# Patient Record
Sex: Female | Born: 1950
Health system: Southern US, Community
[De-identification: ages and names within clinical notes are randomized; demographics above are authoritative.]

## PROBLEM LIST (undated history)

## (undated) DIAGNOSIS — D509 Iron deficiency anemia, unspecified: Secondary | ICD-10-CM

## (undated) DIAGNOSIS — M1990 Unspecified osteoarthritis, unspecified site: Secondary | ICD-10-CM

## (undated) DIAGNOSIS — N200 Calculus of kidney: Secondary | ICD-10-CM

## (undated) DIAGNOSIS — M25561 Pain in right knee: Secondary | ICD-10-CM

## (undated) DIAGNOSIS — K635 Polyp of colon: Secondary | ICD-10-CM

## (undated) DIAGNOSIS — R112 Nausea with vomiting, unspecified: Secondary | ICD-10-CM

## (undated) DIAGNOSIS — M549 Dorsalgia, unspecified: Secondary | ICD-10-CM

## (undated) DIAGNOSIS — E162 Hypoglycemia, unspecified: Secondary | ICD-10-CM

## (undated) DIAGNOSIS — F329 Major depressive disorder, single episode, unspecified: Secondary | ICD-10-CM

## (undated) DIAGNOSIS — E669 Obesity, unspecified: Secondary | ICD-10-CM

## (undated) DIAGNOSIS — R600 Localized edema: Secondary | ICD-10-CM

## (undated) DIAGNOSIS — Z87442 Personal history of urinary calculi: Secondary | ICD-10-CM

## (undated) DIAGNOSIS — E7800 Pure hypercholesterolemia, unspecified: Secondary | ICD-10-CM

## (undated) DIAGNOSIS — I5189 Other ill-defined heart diseases: Secondary | ICD-10-CM

## (undated) DIAGNOSIS — F419 Anxiety disorder, unspecified: Secondary | ICD-10-CM

## (undated) DIAGNOSIS — I2510 Atherosclerotic heart disease of native coronary artery without angina pectoris: Secondary | ICD-10-CM

## (undated) DIAGNOSIS — Z8719 Personal history of other diseases of the digestive system: Secondary | ICD-10-CM

## (undated) DIAGNOSIS — K219 Gastro-esophageal reflux disease without esophagitis: Secondary | ICD-10-CM

## (undated) DIAGNOSIS — K5900 Constipation, unspecified: Secondary | ICD-10-CM

## (undated) DIAGNOSIS — F32A Depression, unspecified: Secondary | ICD-10-CM

## (undated) DIAGNOSIS — R55 Syncope and collapse: Secondary | ICD-10-CM

## (undated) DIAGNOSIS — Z9889 Other specified postprocedural states: Secondary | ICD-10-CM

## (undated) DIAGNOSIS — E042 Nontoxic multinodular goiter: Secondary | ICD-10-CM

## (undated) DIAGNOSIS — E119 Type 2 diabetes mellitus without complications: Secondary | ICD-10-CM

## (undated) DIAGNOSIS — R7303 Prediabetes: Secondary | ICD-10-CM

## (undated) DIAGNOSIS — I7090 Unspecified atherosclerosis: Secondary | ICD-10-CM

## (undated) DIAGNOSIS — C801 Malignant (primary) neoplasm, unspecified: Secondary | ICD-10-CM

## (undated) DIAGNOSIS — I10 Essential (primary) hypertension: Secondary | ICD-10-CM

## (undated) DIAGNOSIS — R5382 Chronic fatigue, unspecified: Secondary | ICD-10-CM

## (undated) DIAGNOSIS — G4730 Sleep apnea, unspecified: Secondary | ICD-10-CM

## (undated) DIAGNOSIS — W57XXXA Bitten or stung by nonvenomous insect and other nonvenomous arthropods, initial encounter: Secondary | ICD-10-CM

## (undated) HISTORY — PX: UPPER GASTROINTESTINAL ENDOSCOPY: SHX188

## (undated) HISTORY — DX: Type 2 diabetes mellitus without complications: E11.9

## (undated) HISTORY — DX: Syncope and collapse: R55

## (undated) HISTORY — DX: Nontoxic multinodular goiter: E04.2

## (undated) HISTORY — PX: COLONOSCOPY: SHX174

## (undated) HISTORY — DX: Unspecified osteoarthritis, unspecified site: M19.90

## (undated) HISTORY — DX: Chronic fatigue, unspecified: R53.82

## (undated) HISTORY — DX: Constipation, unspecified: K59.00

## (undated) HISTORY — DX: Unspecified atherosclerosis: I70.90

## (undated) HISTORY — DX: Polyp of colon: K63.5

## (undated) HISTORY — PX: CARDIAC CATHETERIZATION: SHX172

## (undated) HISTORY — DX: Prediabetes: R73.03

## (undated) HISTORY — DX: Pain in right knee: M25.561

## (undated) HISTORY — DX: Localized edema: R60.0

## (undated) HISTORY — DX: Hypoglycemia, unspecified: E16.2

## (undated) HISTORY — DX: Other ill-defined heart diseases: I51.89

## (undated) HISTORY — PX: TONSILLECTOMY: SUR1361

## (undated) HISTORY — DX: Obesity, unspecified: E66.9

## (undated) HISTORY — PX: ANTERIOR FUSION CERVICAL SPINE: SUR626

## (undated) HISTORY — DX: Iron deficiency anemia, unspecified: D50.9

## (undated) HISTORY — DX: Anxiety disorder, unspecified: F41.9

## (undated) HISTORY — PX: LITHOTRIPSY: SUR834

## (undated) HISTORY — DX: Atherosclerotic heart disease of native coronary artery without angina pectoris: I25.10

## (undated) HISTORY — DX: Bitten or stung by nonvenomous insect and other nonvenomous arthropods, initial encounter: W57.XXXA

## (undated) HISTORY — DX: Dorsalgia, unspecified: M54.9

---

## 1998-07-31 DIAGNOSIS — C801 Malignant (primary) neoplasm, unspecified: Secondary | ICD-10-CM

## 1998-07-31 HISTORY — DX: Malignant (primary) neoplasm, unspecified: C80.1

## 2000-07-31 HISTORY — PX: CARPAL TUNNEL RELEASE: SHX101

## 2001-06-26 ENCOUNTER — Ambulatory Visit (HOSPITAL_COMMUNITY): Admission: RE | Admit: 2001-06-26 | Discharge: 2001-06-26 | Payer: Self-pay | Admitting: Family Medicine

## 2001-06-26 ENCOUNTER — Encounter: Payer: Self-pay | Admitting: Family Medicine

## 2001-08-14 ENCOUNTER — Encounter: Payer: Self-pay | Admitting: Neurosurgery

## 2001-08-14 ENCOUNTER — Inpatient Hospital Stay (HOSPITAL_COMMUNITY): Admission: RE | Admit: 2001-08-14 | Discharge: 2001-08-15 | Payer: Self-pay | Admitting: Neurosurgery

## 2002-01-28 ENCOUNTER — Encounter: Payer: Self-pay | Admitting: Family Medicine

## 2002-01-28 ENCOUNTER — Encounter: Admission: RE | Admit: 2002-01-28 | Discharge: 2002-01-28 | Payer: Self-pay | Admitting: Family Medicine

## 2002-03-06 ENCOUNTER — Emergency Department (HOSPITAL_COMMUNITY): Admission: EM | Admit: 2002-03-06 | Discharge: 2002-03-06 | Payer: Self-pay | Admitting: Emergency Medicine

## 2002-04-22 ENCOUNTER — Emergency Department (HOSPITAL_COMMUNITY): Admission: EM | Admit: 2002-04-22 | Discharge: 2002-04-22 | Payer: Self-pay | Admitting: Emergency Medicine

## 2002-09-26 ENCOUNTER — Ambulatory Visit (HOSPITAL_COMMUNITY): Admission: RE | Admit: 2002-09-26 | Discharge: 2002-09-26 | Payer: Self-pay | Admitting: Gastroenterology

## 2003-08-13 ENCOUNTER — Other Ambulatory Visit: Admission: RE | Admit: 2003-08-13 | Discharge: 2003-08-13 | Payer: Self-pay | Admitting: Family Medicine

## 2003-11-06 ENCOUNTER — Encounter: Admission: RE | Admit: 2003-11-06 | Discharge: 2003-11-06 | Payer: Self-pay | Admitting: Family Medicine

## 2004-09-22 ENCOUNTER — Other Ambulatory Visit: Admission: RE | Admit: 2004-09-22 | Discharge: 2004-09-22 | Payer: Self-pay | Admitting: Family Medicine

## 2004-10-09 ENCOUNTER — Encounter: Admission: RE | Admit: 2004-10-09 | Discharge: 2004-10-09 | Payer: Self-pay | Admitting: Family Medicine

## 2004-10-18 ENCOUNTER — Encounter: Admission: RE | Admit: 2004-10-18 | Discharge: 2005-01-16 | Payer: Self-pay | Admitting: Psychology

## 2004-10-18 ENCOUNTER — Ambulatory Visit: Payer: Self-pay | Admitting: Psychology

## 2005-03-17 ENCOUNTER — Ambulatory Visit (HOSPITAL_COMMUNITY): Admission: RE | Admit: 2005-03-17 | Discharge: 2005-03-17 | Payer: Self-pay | Admitting: Cardiology

## 2005-07-06 ENCOUNTER — Encounter: Admission: RE | Admit: 2005-07-06 | Discharge: 2005-07-06 | Payer: Self-pay | Admitting: Family Medicine

## 2005-07-31 HISTORY — PX: BREAST BIOPSY: SHX20

## 2005-07-31 HISTORY — PX: BREAST CYST EXCISION: SHX579

## 2005-08-01 ENCOUNTER — Encounter: Admission: RE | Admit: 2005-08-01 | Discharge: 2005-08-01 | Payer: Self-pay | Admitting: Family Medicine

## 2005-08-01 ENCOUNTER — Encounter (INDEPENDENT_AMBULATORY_CARE_PROVIDER_SITE_OTHER): Payer: Self-pay | Admitting: *Deleted

## 2006-03-07 ENCOUNTER — Encounter: Admission: RE | Admit: 2006-03-07 | Discharge: 2006-03-07 | Payer: Self-pay | Admitting: Family Medicine

## 2006-03-14 ENCOUNTER — Encounter: Admission: RE | Admit: 2006-03-14 | Discharge: 2006-03-14 | Payer: Self-pay | Admitting: Family Medicine

## 2006-05-25 ENCOUNTER — Other Ambulatory Visit: Admission: RE | Admit: 2006-05-25 | Discharge: 2006-05-25 | Payer: Self-pay | Admitting: Family Medicine

## 2006-06-03 ENCOUNTER — Encounter: Admission: RE | Admit: 2006-06-03 | Discharge: 2006-06-03 | Payer: Self-pay | Admitting: Family Medicine

## 2006-11-06 ENCOUNTER — Encounter: Admission: RE | Admit: 2006-11-06 | Discharge: 2006-11-06 | Payer: Self-pay | Admitting: Cardiology

## 2006-11-07 ENCOUNTER — Ambulatory Visit: Payer: Self-pay | Admitting: Internal Medicine

## 2006-12-11 ENCOUNTER — Encounter: Admission: RE | Admit: 2006-12-11 | Discharge: 2006-12-11 | Payer: Self-pay | Admitting: Neurology

## 2006-12-11 ENCOUNTER — Encounter: Admission: RE | Admit: 2006-12-11 | Discharge: 2006-12-11 | Payer: Self-pay | Admitting: Family Medicine

## 2006-12-25 ENCOUNTER — Encounter: Admission: RE | Admit: 2006-12-25 | Discharge: 2006-12-25 | Payer: Self-pay | Admitting: Neurology

## 2007-12-19 ENCOUNTER — Other Ambulatory Visit: Admission: RE | Admit: 2007-12-19 | Discharge: 2007-12-19 | Payer: Self-pay | Admitting: Family Medicine

## 2008-08-11 ENCOUNTER — Encounter: Admission: RE | Admit: 2008-08-11 | Discharge: 2008-08-11 | Payer: Self-pay | Admitting: Family Medicine

## 2008-08-14 ENCOUNTER — Encounter: Admission: RE | Admit: 2008-08-14 | Discharge: 2008-08-14 | Payer: Self-pay | Admitting: Family Medicine

## 2009-03-09 ENCOUNTER — Encounter: Admission: RE | Admit: 2009-03-09 | Discharge: 2009-03-09 | Payer: Self-pay | Admitting: Family Medicine

## 2009-03-11 ENCOUNTER — Other Ambulatory Visit: Admission: RE | Admit: 2009-03-11 | Discharge: 2009-03-11 | Payer: Self-pay | Admitting: Family Medicine

## 2009-12-29 DIAGNOSIS — M25561 Pain in right knee: Secondary | ICD-10-CM

## 2009-12-29 HISTORY — DX: Pain in right knee: M25.561

## 2010-01-12 ENCOUNTER — Encounter: Admission: RE | Admit: 2010-01-12 | Discharge: 2010-01-12 | Payer: Self-pay | Admitting: Family Medicine

## 2010-11-29 HISTORY — PX: TOTAL KNEE ARTHROPLASTY: SHX125

## 2010-12-06 ENCOUNTER — Other Ambulatory Visit (HOSPITAL_COMMUNITY): Payer: Self-pay

## 2010-12-16 NOTE — Cardiovascular Report (Signed)
NAMEKUULEI, [name]            ACCOUNT NO.:  [id number]   MEDICAL RECORD NO.:  [id number]          PATIENT TYPE:  OIB   LOCATION:  2899                         FACILITY:  [facility]   PHYSICIAN:  Armanda Magic, M.D.     DATE OF BIRTH:  01-15-1951   DATE OF PROCEDURE:  03/17/2005  DATE OF DISCHARGE:                              CARDIAC CATHETERIZATION   REFERRING PHYSICIAN:  Dr. Chales Salmon. Thacker.   PROCEDURE:  Left heart catheterization, coronary angiography, left  ventriculography.   OPERATOR:  Armanda Magic, M.D.   INDICATIONS:  Shortness of breath and abnormal Cardiolite.   COMPLICATIONS:  None.   IV ACCESS:  Via right femoral artery 6-French sheath.   This is a very pleasant 60 year old obese white female with a history of  depression as well as dyslipidemia who has been having progressively  worsening shortness of breath which is limiting her daily activities. She  says she is having problems even playing her flute. She is a Warden/ranger  at [employer]. She says he also gets extremely fatigued when doing any kind of  minimal activity around the house and also gets nonexertional chest  pressure. She now presents for cardiac catheterization. Of note, a stress  Cardiolite study showed a small area of very mild decreased uptake in the  anterior wall. It was unclear as to whether this represented ischemia or  breast attenuation artifact. She now presents for cardiac catheterization.   The patient was brought to cardiac catheterization laboratory in a fasting  nonsedated state. Informed consent was obtained. The patient was connected  to continuous heart rate and pulse oximetry monitoring and intermittent  blood pressure monitoring. The right groin was prepped and draped in a  sterile fashion. One percent Xylocaine was used for local anesthesia. Using  the modified Seldinger technique, a 6-French sheath was placed in right  femoral artery. Under fluoroscopic guidance, a 6-French JL-4  catheter was  placed in the left coronary artery. Multiple cine films were taken at 30-  degree RAO/40-degree LAO views. This catheter was then exchanged out over a  guidewire for 6-French JR-4 catheter which was placed under fluoroscopic  guidance in the right coronary artery. Multiple cine films were taken at 30-  degree RAO/40-degree LAO views. This catheter was exchanged out over a  guidewire for a 6-French angled pigtail catheter was placed under  fluoroscopic guidance in the left ventricular cavity. Left ventriculography  was performed in 30-degree RAO view using a total of 30 cc of contrast at 15  cc per second. The catheter was then pulled back across the aortic valve  with no significant gradient noted. At the end of procedure, all catheters  and sheaths were removed. Manual compression was performed until adequate  hemostasis was obtained. The patient transferred back to her room in stable  condition.   RESULTS:  Left main coronary is widely patent and bifurcates in the left  anterior descending artery and left circumflex artery. Left anterior  descending artery is widely patent throughout its course to the apex, giving  rise to one diagonal branch which is widely patent and bifurcates  into  daughter branches both of which are widely patent.   The left circumflex is widely patent and gives rise to a first high obtuse  marginal branch which has a 20% ostial narrowing. The rest of the vessel is  widely patent. The ongoing circumflex gives rise to a second obtuse marginal  branch which is widely patent, and the left circumflex then traverses the AV  groove and is widely patent.   Right coronary is widely patent throughout its course, bifurcating distally  into a posterior descending artery and posterior lateral artery.   Left ventriculography artery shows normal LV systolic function, EF 60%, left  ventricular pressure 126/11 mmHg, aortic pressure 120/67 mmHg, LVEDP 21   mmHg.   ASSESSMENT:  1.  Nonobstructive coronary disease.  2.  Normal left ventricular function.  3.  Increased LVEDP consistent with diastolic dysfunction.  4.  Shortness of breath secondary to #3.  5.  Obese.  6.  Dyslipidemia.   PLAN:  IV fluid and bed rest for 6 hours and discharge to home. Start  Cardizem LA 180 mg a day for diastolic dysfunction. Start Lasix 20 mg a day,  K-Dur mEq a day. BMET check in one week. Follow-up with Dr. Mayford Knife in two  weeks.      Armanda Magic, M.D.  Electronically Signed     TT/MEDQ  D:  03/17/2005  T:  03/17/2005  Job:  [id number]   cc:   Chales Salmon. Abigail Miyamoto, M.D.  [address]  [place]  [place] [address]  Fax: [phone]

## 2010-12-16 NOTE — Op Note (Signed)
[place]  Patient:    [name], [name] Visit Number: [id number] MRN: [id number]          Service Type: SUR Location: NPAC 3172 03 Attending Physician:  Barton Fanny Dictated by:   Hewitt Shorts, M.D. Proc. Date: 08/14/01 Admit Date:  08/14/2001                             Operative Report  PREOPERATIVE DIAGNOSES:  C4-5, C5-6, and C6-7 herniated cervical disk, degenerative disk disease, spondylosis, and radiculopathy.  POSTOPERATIVE DIAGNOSES:  C4-5, C5-6, and C6-7 herniated cervical disk, degenerative disk disease, spondylosis, and radiculopathy.  PROCEDURE:  C4-5, C5-6, and C6-7 anterior cervical diskectomy and arthrodesis with iliac crest allograft and Tether cervical plating.  SURGEON:  Hewitt Shorts, M.D.  ASSISTANT:  Payton Doughty, M.D.  ANESTHESIA:  General endotracheal.  INDICATION FOR PROCEDURE:  A 60 year old woman who presented with bilateral cervical radiculopathy, right worse than left, who was found by MRI and x-ray to have herniated cervical disk superimposed upon degenerative disk disease and spondylosis at multiple levels with resulting radiculopathy.  The decision was made to proceed with a multilevel anterior cervical diskectomy and arthrodesis.  DESCRIPTION OF PROCEDURE:  The patient was brought to the operating room and placed under general endotracheal anesthesia.  The patient was placed in 10 pounds of Holter traction.  The neck was prepped with Betadine soap and solution and draped in a sterile fashion.  An oblique incision was made just anterior to the anterior border of the left sternocleidomastoid . The line of the incision was infiltrated with local anesthetic with epinephrine.  A skin incision was made, and dissection was carried down through the subcutaneous tissue and platysma, bipolar cautery and electrocautery used to maintain hemostasis as necessary.  Dissection was carried out  through an avascular plane leaving the sternocleidomastoid, carotid artery, and jugular vein laterally and the trachea and esophagus medially.  The ventral aspect of the vertebral column was identified and a localizing x-ray taken, and the C4-5, C5-6, and C6-7 intervertebral disk spaces were identified.  The ventral aspect of the disk spaces were incised and the disk space entered and diskectomy performed with a variety of microcurettes and pituitary rongeurs.  The cartilaginous end plates of the corresponding vertebrae were removed using microcurette and the Micro-Max drill.  The ventral osteophytic overgrowth was removed using the Micro-Max drill and double-action rongeurs.  The microscope was draped and brought into the field to provide additional magnification, illumination, and visualization.  The remainder of the procedure was performed using microdissection and microsurgical technique.  Posterior osteophytic overgrowth was removed using the Micro-Max drill and a 2 mm Kerrison punch with a thin foot plate.  The posterior longitudinal ligament was thickened at each of the levels.  This was removed, as was the disk material and disk herniation.  We were able to decompress the thecal sac and nerve roots within the foramina bilaterally at each level.  Once the decompression was complete,d hemostasis was established with the use of Gelfoam soaked in thrombin.  Then we selected three wedges of iliac crest allograft.  They were cut and shaped to size and positioned in the intervertebral disk space and countersunk.  We then selected a 58 mm Tether cervical plate.  It was positioned over the fusion construct and secured to the vertebrae with a variety of screws.  At C4, 4.0 x 13 mm variable-angled screws  were placed; at C5, a single 4.0 x 14 mm fixed-angle screw was placed; at C6, a 4.0 x 13 mm variable-angled screw was placed; and at C7, a pair of 4.0 x 14 mm variable-angle screws  were placed.  Each of the screw holes was drilled and tapped and the screw placed. After all six screws were placed, final tightening was done.  The wound was irrigated with bacitracin solution and checked for hemostasis.  An x-ray was taken, which showed the grafts in good position at C4-5 and C5-6.  We could not visualize the C6-7 level.  Screws were in good position at C4 and C5.  We could not visualize the C6-7 vertebrae.  The wound was checked once again for hemostasis, and then we proceeded with closure.  The platysma was closed with interrupted, inverted 2-0 undyed Vicryl sutures, the subcutaneous and subcuticular layer were closed with interrupted, inverted 3-0 undyed Vicryl sutures, and the skin edges were approximated with Dermabond.  The patient tolerated the procedure well.  The estimated blood loss was 300 cc.  Sponge and needle count were correct.  Following surgery, the patient, who had had her cervical traction removed at the time of the arthrodesis, was reversed from the anesthetic, extubated, placed in a soft cervical collar, and transferred to the recovery room for further care. Dictated by:   Hewitt Shorts, M.D. Attending Physician:  Barton Fanny DD:  08/14/01 TD:  08/14/01 Job: [id number]

## 2010-12-16 NOTE — Letter (Signed)
November 07, 2006    Armanda Magic, M.D.  [address], [address]  [place], [place] [address]   RE:  [name], [name]  MRN:  [id number]  /  DOB:  Sep 08, 1950   Dear Gloris Manchester,   Thank you for referring [name] for EP evaluation.  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  After  awakening, she apparently injured her leg and got up into a chair and  then passed out yet again.  The patient denies ever having any prior  histories of passing out or syncope, but did note dietary indiscretion  resulting in severe nausea.  She apparently had multiple Tums tablets  after her nausea and this may well have precipitated her diarrhea.  The  patient is referred today for evaluation.  There is no strong family  history of sudden cardiac death and the patient has no evidence of  significant obstructive coronary disease by catheterization by her  report.   Her exam is fairly unremarkable, except that she does have a boot on her  left leg from the injury she sustained with her episode of syncope.   Her medicines have been well-documented.   Her EKG demonstrates sinus rhythm with no prolongation of the Q-T  interval.   I strongly suspect that with her prior absence of any cardiac history  including the absence of history of syncope, that the present episode  simply represents a vagal reaction occurring after having severe nausea  and the urge to defecate.  I have recommended that she try to avoid  circumstances that might reproduce these symptoms.  The patient does  note that a CT scan was at some point done revealing small, what she  described as strokes, although I do not have details of this.  These are  in fact thought to be possible lacunar infarcts and I wonder if  consideration for a hypercoagulable workup would be  in order; I would  defer this to your discretion.  If the patient has recurrent episodes of  syncope, then consideration for head-up tilt table testing would be  warranted, as would be the consideration for insertion of an implantable  loop recorder.  I have not recommended followup with Ms. Ragin, but  will be happy to see her back based on your discretion.   Thanks again for referring this patient for EP evaluation.    Sincerely,      Doylene Canning. Ladona Ridgel, MD  Electronically Signed    GWT/MedQ  DD: 11/07/2006  DT: 11/08/2006  Job #: [id number]

## 2010-12-16 NOTE — Op Note (Signed)
   NAMESAKSHI, SERMONS                      ACCOUNT NO.:  [id number]   MEDICAL RECORD NO.:  [id number]                   PATIENT TYPE:  AMB   LOCATION:  ENDO                                 FACILITY:  [facility]   PHYSICIAN:  Bernette Redbird, M.D.                DATE OF BIRTH:  01-Jun-1951   DATE OF PROCEDURE:  09/26/2002  DATE OF DISCHARGE:                                 OPERATIVE REPORT   PROCEDURE:  Colonoscopy.   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   FINDINGS:  Mild left-sided diverticulosis.   DESCRIPTION OF PROCEDURE:  The nature, purpose, and risks of the procedure  had been discussed with the patient who provided written consent.  Sedation  was fentanyl 75 mcg and Versed 10 mg IV without arrhythmias or desaturation.   The Olympus adult video colonoscope was advanced around the colon without  difficulty to the cecum. The tip was nubbed into the orifice of the terminal  ileum and the small bowel mucosa looked normal. The terminal ileum was not  freely cannulated. Pullback was then performed. The quality of the prep was  excellent and it was felt that all areas were well seen.   The patient had some mild left-sided diverticulosis, but this was otherwise  a normal examination, without evidence of polyps, cancer, colitis, or  vascular malformations.  Retroflexion in the rectum as well as reinspection  of the rectosigmoid was unremarkable.   No biopsies were obtained. The patient tolerated the procedure well and  there were no apparent complications.   IMPRESSION:  Normal screening colonoscopy except for mild left-sided  diverticulosis.   PLAN:  Consider a sigmoidoscopic evaluation in five years for ongoing  screening.                                                Bernette Redbird, M.D.    RB/MEDQ  D:  09/26/2002  T:  09/26/2002  Job:  [id number]   cc:   Chales Salmon. Abigail Miyamoto, M.D.  [address]  [place]  [place] [address]  Fax: [phone]

## 2010-12-16 NOTE — Assessment & Plan Note (Signed)
[facility]                         ELECTROPHYSIOLOGY OFFICE NOTE   NAME:[name], [name]                   MRN:          [id number]  DATE:11/07/2006                            DOB:          1951-01-17    REASON FOR VISIT:  The patient is referred today by Dr. Armanda Magic for  evaluation of syncope.   HISTORY OF PRESENT ILLNESS:  The patient is a very pleasant, 60 year old  lady who has a history of atypical chest pain and catheterization  demonstrating preserved LV function and no significant obstructive  coronary artery disease.  She had two episodes of syncope which occurred  very closely together.  She has never had any other prior episodes of  syncope.  In addition, she has never had any near syncopal episodes in  the past.  The patient states that her current problem began several  weeks ago when she had eaten more than she should have for dinner  (pizza) and then later that evening, eaten a large amount of ice cream.  She subsequently developed some abdominal pain and took approximately 10-  12 Tums tablets and had difficulty sleeping that night.  She stated she  felt bad and had mild nausea.  She works as a professor at [employer] and  went to her office and while she was at her desk developed severe nausea  while sitting and subsequently became lightheaded.  She fell to the  floor after vomiting in the trash can.  Apparently, her leg got tied up  in a chair and she injured it.  She quickly got up and immediately  became nauseated again and again fainted.  After awakening the second  time, she felt nauseated and bad and had actually noted that she had  lost control of both bowel and bladder function.  For all of the above  reasons, she subsequently went home and is now referred for additional  medical attention.  The patient had no significant palpitations.  She  denies chest pain and shortness of breath.   PAST MEDICAL HISTORY:  History of  cervical neck fusion 6 years ago.  She  has a history of a breast cyst.   SOCIAL HISTORY:  She denies tobacco or ethanol abuse.  She does drink  occasional alcoholic beverage socially, but never more than one or two  per day.   FAMILY HISTORY:  Both parents are deceased.  Her mother had  complications of renal failure and her father after a stroke.   REVIEW OF SYSTEMS:  Notable for occasional problems with constipation,  generalized fatigue.  She has reflux symptoms.  She has a history of  hiatal hernia.  She has a history of nephrolithiasis and kidney stones,  but no recent pain from kidney stones.  She has a history of arthritis  particularly in her knees with right great than left.  She has a history  of anxiety and depression.  The patient does state that she has had  headaches and by her report has had a neurologic evaluation which  demonstrated multiple small strokes, although I do not have details  about this.  She states that her mother had a history of blood clots as  well.  The rest of her review of systems was negative.   PHYSICAL EXAMINATION:  GENERAL:  She is a pleasant, well-appearing,  obese, middle-age woman in no acute distress.  VITAL SIGNS:  Blood pressure 137/85, pulse 85 and regular, respirations  18.  Weight 250 pounds.  HEENT:  Normocephalic, atraumatic.  Pupils equal round and reactive to  light.  Oropharynx moist.  Sclerae anicteric.  NECK:  No jugular venous distention.  There is no thyromegaly.  Trachea  midline.  Carotids 2+ and symmetric.  LUNGS:  Clear to auscultation bilaterally, no wheezes, rales or rhonchi.  No increased work of breathing.  CARDIAC:  Regular rate and rhythm with normal S1, S2.  There are no  murmurs, rubs or gallops present.  PMI was not laterally displaced nor  enlarged.  ABDOMEN:  I did not appreciate any rebound or guarding.  Soft,  nontender, nondistended.  EXTREMITIES:  No clubbing, cyanosis or edema.  Pulses were 2+ and   symmetric.  NEUROLOGIC:  Alert and oriented x3.  Cranial nerves 2-12 grossly intact.  Strength was 5/5 and symmetric.   LABORATORY DATA AND X-RAY FINDINGS:  EKG demonstrates sinus rhythm with  normal axis and intervals.   IMPRESSION:  Episode of unexplained syncope associated with loss of  bowel and bladder continence all preceded by severe nausea and abdominal  distress.   DISCUSSION:  The patient has a history of preserved left ventricular  function and no significant coronary disease.  I think the likely  diagnosis is a mediated mechanism secondary to her abdominal distress.  For now, I have recommended no additional followup or care for this  patient other than trying to avoid things that cause recurrent abdominal  pain, i.e. overeating and eating too much in the way of fatty foods.  If  she had recurrent symptoms, then consideration of tilt table test would  be in order.  However, I have most recommend that she try to avoid any  noxious stimuli and that if she does experience these, that she try to  lie down or sit down as soon as possible.  Consideration for  hypercoagulable workup would be in order if she, in fact, does truly  have multiple small strokes on MRI/CT scan.     Doylene Canning. Ladona Ridgel, MD  Electronically Signed    GWT/MedQ  DD: 11/07/2006  DT: 11/08/2006  Job #: [id number]   cc:   Armanda Magic, M.D.  Chales Salmon. Abigail Miyamoto, M.D.

## 2010-12-23 ENCOUNTER — Other Ambulatory Visit (HOSPITAL_COMMUNITY): Payer: BC Managed Care – PPO

## 2010-12-23 ENCOUNTER — Ambulatory Visit (HOSPITAL_COMMUNITY)
Admission: RE | Admit: 2010-12-23 | Discharge: 2010-12-23 | Disposition: A | Discharge status: Home / Self Care | Payer: BC Managed Care – PPO | Source: Ambulatory Visit | Attending: Orthopedic Surgery | Admitting: Orthopedic Surgery

## 2010-12-23 ENCOUNTER — Encounter (HOSPITAL_COMMUNITY)
Admission: RE | Admit: 2010-12-23 | Discharge: 2010-12-23 | Disposition: A | Discharge status: Home / Self Care | Payer: BC Managed Care – PPO | Source: Ambulatory Visit | Attending: Orthopedic Surgery | Admitting: Orthopedic Surgery

## 2010-12-23 ENCOUNTER — Other Ambulatory Visit (HOSPITAL_COMMUNITY): Payer: Self-pay | Admitting: Orthopedic Surgery

## 2010-12-23 DIAGNOSIS — Z01818 Encounter for other preprocedural examination: Secondary | ICD-10-CM | POA: Insufficient documentation

## 2010-12-23 DIAGNOSIS — G4730 Sleep apnea, unspecified: Secondary | ICD-10-CM | POA: Insufficient documentation

## 2010-12-23 DIAGNOSIS — M1711 Unilateral primary osteoarthritis, right knee: Secondary | ICD-10-CM

## 2010-12-23 DIAGNOSIS — M1710 Unilateral primary osteoarthritis, unspecified knee: Secondary | ICD-10-CM | POA: Insufficient documentation

## 2010-12-23 DIAGNOSIS — I10 Essential (primary) hypertension: Secondary | ICD-10-CM | POA: Insufficient documentation

## 2010-12-23 DIAGNOSIS — IMO0002 Reserved for concepts with insufficient information to code with codable children: Secondary | ICD-10-CM | POA: Insufficient documentation

## 2010-12-23 DIAGNOSIS — Z01812 Encounter for preprocedural laboratory examination: Secondary | ICD-10-CM | POA: Insufficient documentation

## 2010-12-23 LAB — COMPREHENSIVE METABOLIC PANEL
BUN: 14 mg/dL (ref 6–23)
CO2: 28 mEq/L (ref 19–32)
Calcium: 10 mg/dL (ref 8.4–10.5)
Chloride: 104 mEq/L (ref 96–112)
Creatinine, Ser: 0.75 mg/dL (ref 0.4–1.2)
GFR calc non Af Amer: >60 mL/min (ref >60)
Glucose, Bld: 113 mg/dL — ABNORMAL HIGH (ref 70–99)
Total Bilirubin: 0.4 mg/dL (ref 0.3–1.2)

## 2010-12-23 LAB — TYPE AND SCREEN
ABO/RH(D): O POS
Antibody Screen: NEGATIVE

## 2010-12-23 LAB — URINE MICROSCOPIC-ADD ON

## 2010-12-23 LAB — URINALYSIS, ROUTINE W REFLEX MICROSCOPIC
Glucose, UA: NEGATIVE mg/dL
Ketones, ur: NEGATIVE mg/dL
Nitrite: NEGATIVE
Specific Gravity, Urine: 1.018 (ref 1.005–1.030)
pH: 6.5 (ref 5.0–8.0)

## 2010-12-23 LAB — PROTIME-INR: Prothrombin Time: 13.5 seconds (ref 11.6–15.2)

## 2010-12-23 LAB — CBC
Hemoglobin: 12.5 g/dL (ref 12.0–15.0)
MCH: 26.3 pg (ref 26.0–34.0)
RBC: 4.76 MIL/uL (ref 3.87–5.11)
WBC: 6.7 10*3/uL (ref 4.0–10.5)

## 2010-12-23 LAB — SURGICAL PCR SCREEN: Staphylococcus aureus: NEGATIVE

## 2010-12-28 ENCOUNTER — Inpatient Hospital Stay (HOSPITAL_COMMUNITY)
Admission: RE | Admit: 2010-12-28 | Discharge: 2010-12-30 | DRG: 209 | Disposition: A | Discharge status: Skilled Nursing Facility | Payer: BC Managed Care – PPO | Source: Ambulatory Visit | Attending: Orthopedic Surgery | Admitting: Orthopedic Surgery

## 2010-12-28 ENCOUNTER — Inpatient Hospital Stay (HOSPITAL_COMMUNITY): Payer: BC Managed Care – PPO

## 2010-12-28 DIAGNOSIS — F3289 Other specified depressive episodes: Secondary | ICD-10-CM | POA: Diagnosis present

## 2010-12-28 DIAGNOSIS — D649 Anemia, unspecified: Secondary | ICD-10-CM | POA: Diagnosis present

## 2010-12-28 DIAGNOSIS — Y9210 Unspecified residential institution as the place of occurrence of the external cause: Secondary | ICD-10-CM | POA: Diagnosis not present

## 2010-12-28 DIAGNOSIS — L298 Other pruritus: Secondary | ICD-10-CM | POA: Diagnosis not present

## 2010-12-28 DIAGNOSIS — R110 Nausea: Secondary | ICD-10-CM | POA: Diagnosis not present

## 2010-12-28 DIAGNOSIS — R7309 Other abnormal glucose: Secondary | ICD-10-CM | POA: Diagnosis not present

## 2010-12-28 DIAGNOSIS — I10 Essential (primary) hypertension: Secondary | ICD-10-CM | POA: Diagnosis present

## 2010-12-28 DIAGNOSIS — L2989 Other pruritus: Secondary | ICD-10-CM | POA: Diagnosis not present

## 2010-12-28 DIAGNOSIS — M1710 Unilateral primary osteoarthritis, unspecified knee: Principal | ICD-10-CM | POA: Diagnosis present

## 2010-12-28 DIAGNOSIS — E669 Obesity, unspecified: Secondary | ICD-10-CM | POA: Diagnosis present

## 2010-12-28 DIAGNOSIS — K219 Gastro-esophageal reflux disease without esophagitis: Secondary | ICD-10-CM | POA: Diagnosis present

## 2010-12-28 DIAGNOSIS — Z7901 Long term (current) use of anticoagulants: Secondary | ICD-10-CM

## 2010-12-28 DIAGNOSIS — T40605A Adverse effect of unspecified narcotics, initial encounter: Secondary | ICD-10-CM | POA: Diagnosis not present

## 2010-12-28 DIAGNOSIS — F329 Major depressive disorder, single episode, unspecified: Secondary | ICD-10-CM | POA: Diagnosis present

## 2010-12-28 DIAGNOSIS — G4733 Obstructive sleep apnea (adult) (pediatric): Secondary | ICD-10-CM | POA: Diagnosis present

## 2010-12-28 DIAGNOSIS — K449 Diaphragmatic hernia without obstruction or gangrene: Secondary | ICD-10-CM | POA: Diagnosis present

## 2010-12-28 DIAGNOSIS — Z981 Arthrodesis status: Secondary | ICD-10-CM

## 2010-12-28 DIAGNOSIS — Z79899 Other long term (current) drug therapy: Secondary | ICD-10-CM

## 2010-12-28 DIAGNOSIS — Z880 Allergy status to penicillin: Secondary | ICD-10-CM

## 2010-12-28 DIAGNOSIS — I2510 Atherosclerotic heart disease of native coronary artery without angina pectoris: Secondary | ICD-10-CM | POA: Diagnosis present

## 2010-12-29 LAB — BASIC METABOLIC PANEL
CO2: 31 mEq/L (ref 19–32)
Calcium: 8 mg/dL — ABNORMAL LOW (ref 8.4–10.5)
Chloride: 100 mEq/L (ref 96–112)
Creatinine, Ser: 0.56 mg/dL (ref 0.4–1.2)
GFR calc Af Amer: >60 mL/min (ref >60)
Glucose, Bld: 137 mg/dL — ABNORMAL HIGH (ref 70–99)
Sodium: 137 mEq/L (ref 135–145)

## 2010-12-29 LAB — CBC
HCT: 34.1 % — ABNORMAL LOW (ref 36.0–46.0)
MCH: 25.7 pg — ABNORMAL LOW (ref 26.0–34.0)
MCV: 81.8 fL (ref 78.0–100.0)
Platelets: 205 10*3/uL (ref 150–400)
RDW: 15.2 % (ref 11.5–15.5)
WBC: 7.1 10*3/uL (ref 4.0–10.5)

## 2010-12-30 ENCOUNTER — Inpatient Hospital Stay (HOSPITAL_COMMUNITY): Payer: BC Managed Care – PPO

## 2010-12-30 LAB — BASIC METABOLIC PANEL
BUN: 6 mg/dL (ref 6–23)
CO2: 29 mEq/L (ref 19–32)
Calcium: 8.8 mg/dL (ref 8.4–10.5)
Chloride: 98 mEq/L (ref 96–112)
Creatinine, Ser: 0.52 mg/dL (ref 0.4–1.2)
Glucose, Bld: 138 mg/dL — ABNORMAL HIGH (ref 70–99)

## 2010-12-30 LAB — CBC
Hemoglobin: 10.4 g/dL — ABNORMAL LOW (ref 12.0–15.0)
MCH: 26.3 pg (ref 26.0–34.0)
MCHC: 32.4 g/dL (ref 30.0–36.0)
MCV: 81.1 fL (ref 78.0–100.0)
RBC: 3.96 MIL/uL (ref 3.87–5.11)

## 2010-12-30 LAB — URINE MICROSCOPIC-ADD ON

## 2010-12-30 LAB — URINALYSIS, ROUTINE W REFLEX MICROSCOPIC
Bilirubin Urine: NEGATIVE
Glucose, UA: NEGATIVE mg/dL
Hgb urine dipstick: NEGATIVE
Ketones, ur: NEGATIVE mg/dL
Nitrite: NEGATIVE
Specific Gravity, Urine: 1.011 (ref 1.005–1.030)
pH: 7 (ref 5.0–8.0)

## 2010-12-31 LAB — URINE CULTURE: Culture  Setup Time: 201206011646

## 2011-01-19 NOTE — Discharge Summary (Signed)
[name], [name]            ACCOUNT NO.:  [id number]  MEDICAL RECORD NO.:  [id number]           PATIENT TYPE:  I  LOCATION:  5019                         FACILITY:  [facility]  PHYSICIAN:  Loreta Ave, M.D. DATE OF BIRTH:  1951-03-02  DATE OF ADMISSION:  12/28/2010 DATE OF DISCHARGE:  12/30/2010                              DISCHARGE SUMMARY   FINAL DIAGNOSES: 1. Status post right total knee replacement for end-stage degenerative     joint disease. 2. Coronary artery disease. 3. Vasovagal syncope. 4. Depression. 5. Dyslipidemia. 6. Hypertension. 7. Gastroesophageal reflux disease with hiatal hernia. 8. Anemia. 9. Hyperglycemia.  HOSPITAL COURSE:  On Dec 28, 2010, the patient was taken to the [facility] OR and a right total knee replacement procedure was performed. Surgeon, Mckinley Jewel, MD and assistant, Zonia Kief, PA-C. Anesthesia, general.  No specimens or cultures.  One Hemovac drain used. Tourniquet time 1 hour.  There were no surgical or anesthesia complications and the patient was transferred to recovery in stable condition.  Though on May 2012, pharmacy protocol, Coumadin, Lovenox started for DVT prophylaxis.  PT/OT consults.  The patient complained of right knee pain.  Discontinued the morphine PCA due to itching and nausea.  Started Percocet.  Temperature 97.3, pulse 90, respirations 18, blood pressure 134/70.  Right knee dressing clean, dry, and intact. Calf, nontender and neurovascularly intact.  Skin is warm and dry. Awaiting transfer to [facility] for rehab.  December 30, 2010, the patient doing much better.  Pain controlled.  She is ready for transfer. Temperature 97.6, WBC 7.7, hemoglobin 10.4, hematocrit 32.1, platelets 181.  Sodium 135, potassium 3.3, chloride 98, CO2 of 29, BUN 6, creatinine 0.52, glucose 138, INR 1.49.  Knee wound looks good and staples intact.  No drainage or signs of infection.  Hemovac drain removed.  Dressing reapplied.  Calf  nontender and neurovascularly intact.  Skin is warm and dry.  DISCHARGE MEDICATIONS: 1. Percocet 10/325, 1 to 2 tablets p.o. q 4-6 hours for pain. 2. Robaxin 500 mg 1 tablet p.o. q.6 h for spasms. 3. Lovenox 30 mg one subcu injection b.i.d. and discontinue when     Coumadin is therapeutic with INR 2 to 3. 4. Coumadin pharmacy protocol.  Maintain INR 2 to 3 x4 weeks postop     for DVT prophylaxis. 5. Zetia 10 mg 1 tablet p.o. daily. 6. Alprazolam 0.5 mg 2 tablets p.o. at bedtime p.r.n. as needed. 7. Nexium 40 mg 1 tablet p.o. daily. 8. Lisinopril 5 mg 1 tablet p.o. daily. 9. Seroquel XR 50 mg 1 tablet p.o. at bedtime. 10.Pristiq XR 50 mg 1 tablet p.o. q.a.m.  CONDITION:  Good and stable.  DISPOSITION:  Transfer to [facility].  INSTRUCTIONS:  While at [facility], the patient will continue to work with PT and OT to improve ambulation and knee range of motion and strengthening.  Weight bear as tolerated.  Can wean from walker to a single prong cane as comfortable.  Daily dressing changes with 4x4 gauze and can pull TED hose over this.  Do not apply any creams or ointments to her incision.  Okay to  shower, but no tub soaking.  Coumadin x4 weeks postop for DVT prophylaxis.  Discontinue Lovenox when Coumadin is therapeutic with INR 2 to 3.  Needs follow up visit with Dr. Eulah Pont 2 weeks postop for recheck and staple removal.  Return sooner if needed. Call our office if there are any questions or concerns.     Genene Churn. Denton Meek.   ______________________________ Loreta Ave, M.D.    JMO/MEDQ  D:  12/30/2010  T:  12/30/2010  Job:  [id number]  Electronically Signed by Zonia Kief P.A. on 01/11/2011 02:01:30 PM Electronically Signed by Mckinley Jewel M.D. on 01/19/2011 04:06:40 PM

## 2011-01-19 NOTE — Op Note (Signed)
[name], [name]            ACCOUNT NO.:  [id number]  MEDICAL RECORD NO.:  [id number]           PATIENT TYPE:  I  LOCATION:  5019                         FACILITY:  [facility]  PHYSICIAN:  Loreta Ave, M.D. DATE OF BIRTH:  12/21/1950  DATE OF PROCEDURE:  12/28/2010 DATE OF DISCHARGE:                              OPERATIVE REPORT   PREOPERATIVE DIAGNOSIS:  Right knee end-stage degenerative arthritis, varus alignment.  POSTOPERATIVE DIAGNOSIS:  Right knee end-stage degenerative arthritis, varus alignment.  PROCEDURES:  Modified minimally invasive right total knee replacement with Stryker triathlon prosthesis.  Cemented pegged posterior stabilized #3 femoral component.  Cemented #4 tibial component 9-mm insert. Cemented resurfacing 35-mm medial offset pegged patellar component. Soft tissue balancing medial capsule release.  SURGEON:  Loreta Ave, MD  ASSISTANT:  Genene Churn. Barry Dienes, [place], present throughout the entire case necessary for timely completion of procedure.  ANESTHESIA:  General.  BLOOD LOSS:  Minimal.  SPECIMENS:  None.  CULTURES:  None.  COMPLICATIONS:  None.  DRESSINGS:  Soft compressive knee immobilizer.  DRAINS:  Hemovac x1.  TOURNIQUET TIME:  1 hour.  PROCEDURE:  The patient was brought to the operating room, placed on the operating table in supine position.  After adequate anesthesia had been obtained, tourniquet was applied to upper aspect of the right knee. Prepped and draped in usual sterile fashion.  Exsanguinated the elevation Esmarch, tourniquet inflated to 350 mmHg.  Knee examined. Varus alignment partially correctable.  A fairly good extension and flexion to 120 degrees.  A straight incision above the patella down to tibial tubercle.  Medial arthrotomy, vastus splitting preserving quad tendon.  Knee exposed.  Grade 4 change throughout.  Medial capsule released.  Remnants of menisci, cruciate ligaments, and periarticular spurs  removed.  Intramedullary guide on the femur.  Distal cut 10 mm 75 degrees of valgus.  Using epicondylar axis, the femur was sized, cut, and fitted for a posterior stabilized #3 femoral component. Extramedullary guide on the tibia.  A 3-degree posterior slope cut. Adequate resection for a 9-mm insert.  All recess examined, all debris cleared throughout.  All spurs removed.  A trial was put in place.  A #3 above, #4 below, and a 9-mm insert.  Full extension, full flexion, good alignment, good stability, good balancing in flexion and extension, good mechanical axis.  Tibia was marked for rotation and then hand reamed. Patella was exposed.  Posterior 10 mm removed.  Drilled sized and fitted for a 35-mm component.  With trials, this had excellent tracking.  All trials were removed.  Copious irrigation with pulse irrigating device. Cement prepared and placed on all components firmly seated. Polyethylene attached to tibia and knee reduced.  Patella held with a clamp.  Once the cement had hardened, the knee was reexamined.  Again, pleased with alignment, stability, tracking, and mechanical axis. Hemovac was placed through a separate stab wound.  Arthrotomy closed with #1 Vicryl, skin and subcutaneous tissue with Vicryl and staples. Sterile compressive dressing applied.  Tourniquet was inflated and removed.  Knee immobilizer applied.  Anesthesia reversed.  Brought to the recovery room.  Tolerated the surgery well and  no complications.     Loreta Ave, M.D.     DFM/MEDQ  D:  12/28/2010  T:  12/29/2010  Job:  [id number]  Electronically Signed by Mckinley Jewel M.D. on 01/19/2011 04:06:38 PM

## 2011-02-17 ENCOUNTER — Other Ambulatory Visit: Payer: Self-pay | Admitting: Family Medicine

## 2011-02-17 DIAGNOSIS — Z1231 Encounter for screening mammogram for malignant neoplasm of breast: Secondary | ICD-10-CM

## 2011-02-27 ENCOUNTER — Ambulatory Visit
Admission: RE | Admit: 2011-02-27 | Discharge: 2011-02-27 | Disposition: A | Discharge status: Home / Self Care | Payer: BC Managed Care – PPO | Source: Ambulatory Visit | Attending: Family Medicine | Admitting: Family Medicine

## 2011-02-27 DIAGNOSIS — Z1231 Encounter for screening mammogram for malignant neoplasm of breast: Secondary | ICD-10-CM

## 2011-03-13 ENCOUNTER — Encounter (HOSPITAL_COMMUNITY)
Admission: RE | Admit: 2011-03-13 | Discharge: 2011-03-13 | Disposition: A | Discharge status: Home / Self Care | Payer: BC Managed Care – PPO | Source: Ambulatory Visit | Attending: Neurosurgery | Admitting: Neurosurgery

## 2011-03-13 LAB — BASIC METABOLIC PANEL
BUN: 17 mg/dL (ref 6–23)
CO2: 30 mEq/L (ref 19–32)
Calcium: 10.5 mg/dL (ref 8.4–10.5)
Chloride: 104 mEq/L (ref 96–112)
Creatinine, Ser: 0.66 mg/dL (ref 0.50–1.10)
GFR calc Af Amer: >60 mL/min (ref >60)
GFR calc non Af Amer: >60 mL/min (ref >60)
Glucose, Bld: 103 mg/dL — ABNORMAL HIGH (ref 70–99)
Potassium: 4.8 mEq/L (ref 3.5–5.1)
Sodium: 142 mEq/L (ref 135–145)

## 2011-03-13 LAB — CBC
HCT: 39.1 % (ref 36.0–46.0)
Hemoglobin: 12.8 g/dL (ref 12.0–15.0)
MCH: 26.2 pg (ref 26.0–34.0)
MCHC: 32.7 g/dL (ref 30.0–36.0)
MCV: 80 fL (ref 78.0–100.0)
Platelets: 240 10*3/uL (ref 150–400)
RBC: 4.89 MIL/uL (ref 3.87–5.11)
RDW: 15.2 % (ref 11.5–15.5)
WBC: 9.5 10*3/uL (ref 4.0–10.5)

## 2011-03-13 LAB — SURGICAL PCR SCREEN
MRSA, PCR: NEGATIVE
Staphylococcus aureus: NEGATIVE

## 2011-03-22 ENCOUNTER — Ambulatory Visit (HOSPITAL_COMMUNITY)
Admission: RE | Admit: 2011-03-22 | Discharge: 2011-03-23 | Disposition: A | Discharge status: Home / Self Care | Payer: BC Managed Care – PPO | Source: Ambulatory Visit | Attending: Neurosurgery | Admitting: Neurosurgery

## 2011-03-22 ENCOUNTER — Ambulatory Visit (HOSPITAL_COMMUNITY): Payer: BC Managed Care – PPO

## 2011-03-22 DIAGNOSIS — F329 Major depressive disorder, single episode, unspecified: Secondary | ICD-10-CM | POA: Insufficient documentation

## 2011-03-22 DIAGNOSIS — F3289 Other specified depressive episodes: Secondary | ICD-10-CM | POA: Insufficient documentation

## 2011-03-22 DIAGNOSIS — I10 Essential (primary) hypertension: Secondary | ICD-10-CM | POA: Insufficient documentation

## 2011-03-22 DIAGNOSIS — M129 Arthropathy, unspecified: Secondary | ICD-10-CM | POA: Insufficient documentation

## 2011-03-22 DIAGNOSIS — G4733 Obstructive sleep apnea (adult) (pediatric): Secondary | ICD-10-CM | POA: Insufficient documentation

## 2011-03-22 DIAGNOSIS — D649 Anemia, unspecified: Secondary | ICD-10-CM | POA: Insufficient documentation

## 2011-03-22 DIAGNOSIS — K219 Gastro-esophageal reflux disease without esophagitis: Secondary | ICD-10-CM | POA: Insufficient documentation

## 2011-03-22 DIAGNOSIS — M5020 Other cervical disc displacement, unspecified cervical region: Secondary | ICD-10-CM | POA: Insufficient documentation

## 2011-03-22 DIAGNOSIS — Z01812 Encounter for preprocedural laboratory examination: Secondary | ICD-10-CM | POA: Insufficient documentation

## 2011-03-22 DIAGNOSIS — M47812 Spondylosis without myelopathy or radiculopathy, cervical region: Secondary | ICD-10-CM | POA: Insufficient documentation

## 2011-03-22 DIAGNOSIS — Z01810 Encounter for preprocedural cardiovascular examination: Secondary | ICD-10-CM | POA: Insufficient documentation

## 2011-03-22 DIAGNOSIS — M5030 Other cervical disc degeneration, unspecified cervical region: Secondary | ICD-10-CM | POA: Insufficient documentation

## 2011-03-22 DIAGNOSIS — G5600 Carpal tunnel syndrome, unspecified upper limb: Secondary | ICD-10-CM | POA: Insufficient documentation

## 2011-03-23 NOTE — Op Note (Signed)
NAMEARYANI, DAFFERN            ACCOUNT NO.:  [id number]  MEDICAL RECORD NO.:  [id number]  LOCATION:  3528                         FACILITY:  [facility]  PHYSICIAN:  Hewitt Shorts, M.D.DATE OF BIRTH:  Dec 06, 1950  DATE OF PROCEDURE:  03/22/2011 DATE OF DISCHARGE:                              OPERATIVE REPORT   PREOPERATIVE DIAGNOSES: 1. Bilateral carpal tunnel syndrome. 2. C3-4 cervical disk herniation, degenerative disk disease,     spondylosis and stenosis.  POSTOPERATIVE DIAGNOSES: 1. Bilateral carpal tunnel syndrome. 2. C3-4 cervical disk herniation, degenerative disk disease,     spondylosis and stenosis.  PROCEDURES: 1. Left carpal tunnel release. 2. C3-4 anterior cervical decompression arthrodesis with allograft and     Tether cervical plating.  SURGEON:  Hewitt Shorts, MD  ASSISTANT:  Lovell Sheehan.  ANESTHESIA:  General endotracheal.  INDICATIONS:  The patient is a 60 year old woman.  She is status post a previous three-level C4-5, C5-6, C6-7 ACDF 9 years ago.  She recovered well from that surgery but has developed increasing neck pain with early myelopathic phenomenon.  She describes a numbness and tingling that extend down toward torso, upper and lower extremities.  She has also had significant numbness her hands and workup including EMG nerve conduction studies which revealed bilateral carpal tunnel syndrome left worse than right as well as significant spondylosis, degenerative disk disease, superimposed disk herniation and stenosis at the C3-4 level.  However, there is no alteration of cord signal.  A decision made to see with left carpal tunnel release and C3-4 anterior cervical decompression arthrodesis.  PROCEDURE IN DETAILS:  The patient was brought to the operating room, placed general endotracheal anesthesia.  We had the Anesthesia Service place a cuff on the left arm and using an elastic wrap exsanguinated the venous blood and then the left upper  extremity was prepped with Betadine soap and solution, draped in a sterile fashion.  Incision was made in the left palm medial to the thenar crease extending from just beyond the distal carpal crease.  Dissection was carried down through the subcutaneous tissue to the transverse carpal ligament that was carefully opened from its distal to proximal extent decompressing the underlying median nerve.  We then approximated the subcuticular layer with interrupted inverted 3-0 undyed Vicryl sutures and the skin was reapproximated with interrupted 4-0 nylon placed in a horizontal mattress fashion, interrupted.  The wound was dressed with Adaptic and gauze fluffs and wrapped with a Kling.  Procedure was tolerated well. The estimated blood loss was nil.  Sponge and needle count correct.  The patient was repositioned.  The left upper extremity was positioned with the left hand and forearm gently elevated and then the patient was placed in 10 pounds of Halter traction and the neck was prepped with Betadine soap and solution and draped in a sterile fashion.  The line of incision was infiltrated with local anesthetic with epinephrine. Incision was made over the C3-4 level.  Dissection was carried down through the subcutaneous tissue and platysma.  Bipolar cautery was used to maintain hemostasis.  Dissection was carried out through an avascular place leaving the sternocleidomastoid, carotid artery, jugular vein laterally, trachea and esophagus medially.  The ventral aspect  of the vertebral column was identified.  The previous plate was identified on the ventral aspect of the C4 vertebra and the C3-4 intervertebral disk space was identified.  Diskectomy was begun with incision of the annulus and continued with micro curettes and pituitary rongeurs.  Anterior osteophytic overgrowth was removed using 2-mm Kerrison punch, the high- speed drill, the osteophyte removal tool.  The operating microscope  was draped and brought into the field to provide additional navigation, illumination and visualization.  The remainder of the decompression was performed using microdissection, microsurgical technique.  Cartilaginous endplates were removed using micro curettes along with the high-speed drill and then posterior osteophytic overgrowth was removed using high- speed drill along with a 1-mm and 2-mm Kerrison punch with a thin footplate.  The posterior longitudinal ligament and disk herniation were carefully removed and then we further removed spondylotic overgrowth from the posterior aspects of C3 and C4.  Good decompression of the spinal canal and thecal sac was achieved and we turned our attention to the neural foramina and continued to remove spondylotic overgrowth decompressing the ventral aspect of the neural foramina and the exiting C4 nerve roots.  Once the decompression was completed, hemostasis was established with the use of Gelfoam soaked in thrombin.  We then measured the height of the intervertebral disk space and selected a 6-mm allograft implant.  It was hydrated in saline solution and positioned in the intervertebral disk space and countersunk.  We then selected 12-mm Tether cervical plate.  It was carefully positioned over the C4 vertebra to avoid the previous plate and then drill holes were made in the C3 and C4 vertebra and the plate was secured to the vertebra, C3 level with a pair of 4 x 13-mm fixed screws and at the C4 level with 4 x 14 mm variable angle screws.  Once all 4 screws were in place, final tightening was performed.  An x-ray was taken which showed the graft, plate and screws all in good position.  The alignment was good.  The wound was irrigated with bacitracin solution, checked for hemostasis, it was established and confirmed and we proceeded then with closure. Platysma was closed with interrupted inverted 2-0 undyed Vicryl sutures. Subcutaneous and  subcuticular were closed with interrupted inverted 3-0 undyed Vicryl sutures.  The skin was reapproximated with Dermabond.  The procedure was tolerated well.  Estimated blood loss was 50 mL.  Sponge count correct.  Following surgery the patient was taken out of cervical traction, placed in a soft cervical collar.  The left hand was gently elevated and she was transferred to the recovery room for further care where she was noted to be moving all 4 extremities to command.     Hewitt Shorts, M.D.     RWN/MEDQ  D:  03/22/2011  T:  03/22/2011  Job:  [id number]  Electronically Signed by Shirlean Kelly M.D. on 03/23/2011 08:12:11 AM

## 2011-05-16 ENCOUNTER — Emergency Department (HOSPITAL_COMMUNITY)
Admission: EM | Admit: 2011-05-16 | Discharge: 2011-05-17 | Disposition: A | Discharge status: Home / Self Care | Payer: BC Managed Care – PPO | Attending: Emergency Medicine | Admitting: Emergency Medicine

## 2011-05-16 DIAGNOSIS — Z139 Encounter for screening, unspecified: Secondary | ICD-10-CM | POA: Insufficient documentation

## 2011-05-16 DIAGNOSIS — F329 Major depressive disorder, single episode, unspecified: Secondary | ICD-10-CM | POA: Insufficient documentation

## 2011-05-16 DIAGNOSIS — I10 Essential (primary) hypertension: Secondary | ICD-10-CM | POA: Insufficient documentation

## 2011-05-16 DIAGNOSIS — F411 Generalized anxiety disorder: Secondary | ICD-10-CM | POA: Insufficient documentation

## 2011-05-16 DIAGNOSIS — F3289 Other specified depressive episodes: Secondary | ICD-10-CM | POA: Insufficient documentation

## 2011-11-29 ENCOUNTER — Encounter (HOSPITAL_COMMUNITY): Payer: Self-pay | Admitting: Pharmacy Technician

## 2011-12-01 ENCOUNTER — Other Ambulatory Visit (HOSPITAL_COMMUNITY): Payer: BC Managed Care – PPO

## 2011-12-06 ENCOUNTER — Encounter (HOSPITAL_COMMUNITY)
Admission: RE | Admit: 2011-12-06 | Discharge: 2011-12-06 | Disposition: A | Discharge status: Home / Self Care | Payer: BC Managed Care – PPO | Source: Ambulatory Visit | Attending: Orthopedic Surgery | Admitting: Orthopedic Surgery

## 2011-12-06 ENCOUNTER — Encounter (HOSPITAL_COMMUNITY): Payer: Self-pay

## 2011-12-06 HISTORY — DX: Essential (primary) hypertension: I10

## 2011-12-06 HISTORY — DX: Sleep apnea, unspecified: G47.30

## 2011-12-06 HISTORY — DX: Major depressive disorder, single episode, unspecified: F32.9

## 2011-12-06 HISTORY — DX: Gastro-esophageal reflux disease without esophagitis: K21.9

## 2011-12-06 HISTORY — DX: Unspecified osteoarthritis, unspecified site: M19.90

## 2011-12-06 HISTORY — DX: Depression, unspecified: F32.A

## 2011-12-06 LAB — URINE MICROSCOPIC-ADD ON

## 2011-12-06 LAB — COMPREHENSIVE METABOLIC PANEL
Albumin: 3.9 g/dL (ref 3.5–5.2)
BUN: 17 mg/dL (ref 6–23)
Calcium: 10.1 mg/dL (ref 8.4–10.5)
Creatinine, Ser: 0.74 mg/dL (ref 0.50–1.10)
GFR calc Af Amer: >90 mL/min (ref >90)
Glucose, Bld: 105 mg/dL — ABNORMAL HIGH (ref 70–99)
Total Protein: 6.9 g/dL (ref 6.0–8.3)

## 2011-12-06 LAB — URINALYSIS, ROUTINE W REFLEX MICROSCOPIC
Bilirubin Urine: NEGATIVE
Hgb urine dipstick: NEGATIVE
Ketones, ur: NEGATIVE mg/dL
Nitrite: NEGATIVE
Urobilinogen, UA: 0.2 mg/dL (ref 0.0–1.0)

## 2011-12-06 LAB — APTT: aPTT: 29 seconds (ref 24–37)

## 2011-12-06 LAB — TYPE AND SCREEN: Antibody Screen: NEGATIVE

## 2011-12-06 LAB — CBC
HCT: 38.8 % (ref 36.0–46.0)
Hemoglobin: 12.5 g/dL (ref 12.0–15.0)
MCH: 26.4 pg (ref 26.0–34.0)
MCHC: 32.2 g/dL (ref 30.0–36.0)

## 2011-12-06 LAB — PROTIME-INR
INR: 1.02 (ref 0.00–1.49)
Prothrombin Time: 13.6 seconds (ref 11.6–15.2)

## 2011-12-06 LAB — SURGICAL PCR SCREEN: Staphylococcus aureus: NEGATIVE

## 2011-12-06 NOTE — Pre-Procedure Instructions (Signed)
20 CRYSTALLYNN NOORANI  12/06/2011   Your procedure is scheduled on:  Wednesday May 15  Report to [facility] Stay [facility] at 6:30 AM.  Call this number if you have problems the morning of surgery: [phone]   Remember:   Do not eat food:After Midnight.  May have clear liquids: up to 4 Hours before arrival.  Clear liquids include soda, tea, black coffee, apple or grape juice, broth.  Take these medicines the morning of surgery with A SIP OF WATER: Abilify, Cogentin, Pristiq, Nexium   Do not wear jewelry, make-up or nail polish.  Do not wear lotions, powders, or perfumes. You may wear deodorant.  Do not shave 48 hours prior to surgery.  Do not bring valuables to the hospital.  Contacts, dentures or bridgework may not be worn into surgery.  Leave suitcase in the car. After surgery it may be brought to your room.  For patients admitted to the hospital, checkout time is 11:00 AM the day of discharge.   Patients discharged the day of surgery will not be allowed to drive home.  Name and phone number of your driver: NA  Special Instructions: Incentive Spirometry - Practice and bring it with you on the day of surgery. and CHG Shower Use Special Wash: 1/2 bottle night before surgery and 1/2 bottle morning of surgery.   Please read over the following fact sheets that you were given: Pain Booklet, Coughing and Deep Breathing, Blood Transfusion Information, Total Joint Packet and Surgical Site Infection Prevention

## 2011-12-12 MED ORDER — VANCOMYCIN HCL 1000 MG IV SOLR
1500.0000 mg | INTRAVENOUS | Status: AC
  Administered 2011-12-13: 1500 mg via INTRAVENOUS
  Filled 2011-12-12: qty 1500

## 2011-12-12 NOTE — H&P (Signed)
  [facility] [address]   [address] [place], [place] [address] [phone] A Division of [facility]  Loreta Ave, M.D.     Robert A. Thurston Hole, M.D.     Lunette Stands, M.D. Eulas Post, M.D.    Buford Dresser, M.D. Estell Harpin, M.D. Ralene Cork, D.O.          Genene Churn. Barry Dienes, PA-C            Kirstin A. Shepperson, PA-C Janace Litten, OPA-C   RE: [name], [name]                                [id number]      DOB: 1951/07/06 PROGRESS NOTE: 12-05-11 Chief complaint: Left knee pain.  History of present illness: 26 one year-old white female with a history of end stage DJD, left knee, and chronic pain.  Returns.  States that knee pain unchanged from previous visit.  She is wanting to proceed with total knee replacement as scheduled.   Current medications: See attached list. Allergies: Question Codeine and Guaifenesin, Hydrocodone, Oxycodone, Morphine and Tramadol.     Past medical/surgical history: Status post right total knee replacement, coronary artery disease, vasovagal syncope, depression, dyslipidemia, hypertension, GERD with hiatal hernia, anemia and hyperglycemia.   Review of systems: Patient denies fevers, chills, lightheadedness, dizziness, current cardiac, pulmonary, GI, GU or neuro issues.  Family history: Positive for hypertension, heart disease and arthritis. Social history: Does not smoke, admits occasional alcohol use.  She is a professor at [employer].      EXAMINATION: Height: 5?7.  Weight: 235 pounds.  Blood pressure: 121/80.  Pulse: 82.  Respirations: 16.  Temperature: 98.8.  Pleasant white female, alert and oriented x 3 and in no acute distress.  Head is normocephalic, a traumatic.  PERRLA, EOMI.  Lungs: CTA bilaterally.  No wheezes.  Heart: RRR.  No murmur noted.  Abdomen: Round and non-distended.  NBS x 4.  Soft and non-tender.  Left knee: Decreased range of motion.  Positive crepitus.  Joint  line tender.  Positive effusion.  Calf non-tender.  Neurovascularly intact.  Skin warm and dry.    IMPRESSION: End stage DJD, left knee, and pain.  Failed conservative treatment.  PLAN: We will proceed with left total knee replacement as scheduled.  Surgical procedure, along with potential rehab/recovery time discussed.  All questions answered.  We have not received pre-op clearances from Dr. Sigmund Hazel, primary care physician, or Dr. Armanda Magic, cardiologist.  She was last seen by both physicians about six months ago.  All questions answered.   Loreta Ave, M.D.   Electronically verified by Loreta Ave, M.D. DFM(JMO):jjh Cc: Dr. Sigmund Hazel, fax: [phone] Cc: Dr. Armanda Magic, fax: [phone]  D 12-06-11 T 12-07-11

## 2011-12-13 ENCOUNTER — Encounter (HOSPITAL_COMMUNITY): Payer: Self-pay | Admitting: *Deleted

## 2011-12-13 ENCOUNTER — Ambulatory Visit (HOSPITAL_COMMUNITY): Payer: BC Managed Care – PPO | Admitting: Anesthesiology

## 2011-12-13 ENCOUNTER — Inpatient Hospital Stay (HOSPITAL_COMMUNITY)
Admission: RE | Admit: 2011-12-13 | Discharge: 2011-12-15 | DRG: 209 | Disposition: A | Discharge status: Skilled Nursing Facility | Payer: BC Managed Care – PPO | Source: Ambulatory Visit | Attending: Orthopedic Surgery | Admitting: Orthopedic Surgery

## 2011-12-13 ENCOUNTER — Encounter (HOSPITAL_COMMUNITY)
Admission: RE | Disposition: A | Discharge status: Skilled Nursing Facility | Payer: Self-pay | Source: Ambulatory Visit | Attending: Orthopedic Surgery

## 2011-12-13 ENCOUNTER — Encounter (HOSPITAL_COMMUNITY): Payer: Self-pay | Admitting: Anesthesiology

## 2011-12-13 ENCOUNTER — Inpatient Hospital Stay (HOSPITAL_COMMUNITY): Payer: BC Managed Care – PPO

## 2011-12-13 DIAGNOSIS — F329 Major depressive disorder, single episode, unspecified: Secondary | ICD-10-CM | POA: Diagnosis present

## 2011-12-13 DIAGNOSIS — D649 Anemia, unspecified: Secondary | ICD-10-CM | POA: Diagnosis present

## 2011-12-13 DIAGNOSIS — E785 Hyperlipidemia, unspecified: Secondary | ICD-10-CM | POA: Diagnosis present

## 2011-12-13 DIAGNOSIS — F3289 Other specified depressive episodes: Secondary | ICD-10-CM | POA: Diagnosis present

## 2011-12-13 DIAGNOSIS — R55 Syncope and collapse: Secondary | ICD-10-CM | POA: Diagnosis present

## 2011-12-13 DIAGNOSIS — I2510 Atherosclerotic heart disease of native coronary artery without angina pectoris: Secondary | ICD-10-CM | POA: Diagnosis present

## 2011-12-13 DIAGNOSIS — Z888 Allergy status to other drugs, medicaments and biological substances status: Secondary | ICD-10-CM

## 2011-12-13 DIAGNOSIS — K219 Gastro-esophageal reflux disease without esophagitis: Secondary | ICD-10-CM | POA: Diagnosis present

## 2011-12-13 DIAGNOSIS — Z471 Aftercare following joint replacement surgery: Secondary | ICD-10-CM

## 2011-12-13 DIAGNOSIS — M1710 Unilateral primary osteoarthritis, unspecified knee: Principal | ICD-10-CM | POA: Diagnosis present

## 2011-12-13 DIAGNOSIS — Z885 Allergy status to narcotic agent status: Secondary | ICD-10-CM

## 2011-12-13 HISTORY — DX: Personal history of other diseases of the digestive system: Z87.19

## 2011-12-13 HISTORY — PX: TOTAL KNEE ARTHROPLASTY: SHX125

## 2011-12-13 HISTORY — DX: Calculus of kidney: N20.0

## 2011-12-13 HISTORY — DX: Pure hypercholesterolemia, unspecified: E78.00

## 2011-12-13 SURGERY — ARTHROPLASTY, KNEE, TOTAL
Anesthesia: General | Site: Knee | Laterality: Left | Wound class: Clean

## 2011-12-13 MED ORDER — BUPIVACAINE-EPINEPHRINE PF 0.5-1:200000 % IJ SOLN
INTRAMUSCULAR | Status: DC | PRN
  Administered 2011-12-13: 30 mL

## 2011-12-13 MED ORDER — LIDOCAINE HCL (CARDIAC) 20 MG/ML IV SOLN
INTRAVENOUS | Status: DC | PRN
  Administered 2011-12-13: 50 mg via INTRAVENOUS

## 2011-12-13 MED ORDER — WARFARIN - PHARMACIST DOSING INPATIENT: Freq: Every day | Status: DC

## 2011-12-13 MED ORDER — METHOCARBAMOL 100 MG/ML IJ SOLN
500.0000 mg | INTRAVENOUS | Status: AC
  Administered 2011-12-13: 500 mg via INTRAVENOUS
  Filled 2011-12-13: qty 5

## 2011-12-13 MED ORDER — HYDROMORPHONE 0.3 MG/ML IV SOLN
INTRAVENOUS | Status: DC
  Administered 2011-12-13: 0.799 mg via INTRAVENOUS
  Administered 2011-12-14: 0.4 mg via INTRAVENOUS
  Administered 2011-12-14: 1.19 mg via INTRAVENOUS

## 2011-12-13 MED ORDER — ACETAMINOPHEN 325 MG PO TABS
650.0000 mg | ORAL_TABLET | Freq: Four times a day (QID) | ORAL | Status: DC | PRN
  Administered 2011-12-14 (×2): 650 mg via ORAL
  Filled 2011-12-13 (×2): qty 2

## 2011-12-13 MED ORDER — VANCOMYCIN HCL IN DEXTROSE 1-5 GM/200ML-% IV SOLN
1000.0000 mg | Freq: Two times a day (BID) | INTRAVENOUS | Status: AC
  Administered 2011-12-13 – 2011-12-14 (×2): 1000 mg via INTRAVENOUS
  Filled 2011-12-13 (×2): qty 200

## 2011-12-13 MED ORDER — BENZTROPINE MESYLATE 0.5 MG PO TABS
0.5000 mg | ORAL_TABLET | Freq: Every day | ORAL | Status: DC
  Administered 2011-12-14 – 2011-12-15 (×2): 0.5 mg via ORAL
  Filled 2011-12-13 (×2): qty 1

## 2011-12-13 MED ORDER — MENTHOL 3 MG MT LOZG: 1.0000 | LOZENGE | OROMUCOSAL | Status: DC | PRN

## 2011-12-13 MED ORDER — COLESEVELAM HCL 625 MG PO TABS
3750.0000 mg | ORAL_TABLET | Freq: Every day | ORAL | Status: DC
  Filled 2011-12-13 (×3): qty 6

## 2011-12-13 MED ORDER — SODIUM CHLORIDE 0.9 % IV SOLN
INTRAVENOUS | Status: DC | PRN
  Administered 2011-12-13: 09:00:00 via INTRAVENOUS

## 2011-12-13 MED ORDER — POTASSIUM CHLORIDE IN NACL 20-0.9 MEQ/L-% IV SOLN
INTRAVENOUS | Status: DC
  Administered 2011-12-13 – 2011-12-14 (×2): via INTRAVENOUS
  Filled 2011-12-13 (×6): qty 1000

## 2011-12-13 MED ORDER — ZOLPIDEM TARTRATE 10 MG PO TABS
10.0000 mg | ORAL_TABLET | Freq: Every day | ORAL | Status: DC
  Administered 2011-12-13 – 2011-12-14 (×2): 10 mg via ORAL
  Filled 2011-12-13 (×2): qty 1

## 2011-12-13 MED ORDER — WARFARIN VIDEO: Freq: Once | Status: DC

## 2011-12-13 MED ORDER — HYDROMORPHONE HCL PF 1 MG/ML IJ SOLN
0.2500 mg | INTRAMUSCULAR | Status: DC | PRN
  Administered 2011-12-13 (×3): 0.5 mg via INTRAVENOUS

## 2011-12-13 MED ORDER — PROPOFOL 10 MG/ML IV EMUL
INTRAVENOUS | Status: DC | PRN
  Administered 2011-12-13: 200 mg via INTRAVENOUS

## 2011-12-13 MED ORDER — PANTOPRAZOLE SODIUM 40 MG PO TBEC
40.0000 mg | DELAYED_RELEASE_TABLET | Freq: Every day | ORAL | Status: DC
  Administered 2011-12-14: 40 mg via ORAL
  Filled 2011-12-13 (×2): qty 1

## 2011-12-13 MED ORDER — METHOCARBAMOL 100 MG/ML IJ SOLN
500.0000 mg | Freq: Four times a day (QID) | INTRAVENOUS | Status: DC | PRN
  Filled 2011-12-13: qty 5

## 2011-12-13 MED ORDER — ENOXAPARIN SODIUM 30 MG/0.3ML ~~LOC~~ SOLN
30.0000 mg | Freq: Two times a day (BID) | SUBCUTANEOUS | Status: DC
  Administered 2011-12-14 – 2011-12-15 (×3): 30 mg via SUBCUTANEOUS
  Filled 2011-12-13 (×5): qty 0.3

## 2011-12-13 MED ORDER — LACTATED RINGERS IV SOLN
INTRAVENOUS | Status: DC | PRN
  Administered 2011-12-13 (×2): via INTRAVENOUS

## 2011-12-13 MED ORDER — MIDAZOLAM HCL 5 MG/5ML IJ SOLN
INTRAMUSCULAR | Status: DC | PRN
  Administered 2011-12-13: 2 mg via INTRAVENOUS

## 2011-12-13 MED ORDER — WARFARIN SODIUM 7.5 MG PO TABS
7.5000 mg | ORAL_TABLET | Freq: Once | ORAL | Status: AC
  Administered 2011-12-13: 7.5 mg via ORAL
  Filled 2011-12-13: qty 1

## 2011-12-13 MED ORDER — ACETAMINOPHEN 10 MG/ML IV SOLN
INTRAVENOUS | Status: AC
  Filled 2011-12-13: qty 100

## 2011-12-13 MED ORDER — ACETAMINOPHEN 650 MG RE SUPP: 650.0000 mg | Freq: Four times a day (QID) | RECTAL | Status: DC | PRN

## 2011-12-13 MED ORDER — COUMADIN BOOK
Freq: Once | Status: AC
  Administered 2011-12-13: 19:00:00
  Filled 2011-12-13: qty 1

## 2011-12-13 MED ORDER — EZETIMIBE 10 MG PO TABS
10.0000 mg | ORAL_TABLET | Freq: Every day | ORAL | Status: DC
  Administered 2011-12-13 – 2011-12-14 (×2): 10 mg via ORAL
  Filled 2011-12-13 (×3): qty 1

## 2011-12-13 MED ORDER — ONDANSETRON HCL 4 MG PO TABS: 4.0000 mg | ORAL_TABLET | Freq: Four times a day (QID) | ORAL | Status: DC | PRN

## 2011-12-13 MED ORDER — HYDROMORPHONE HCL PF 1 MG/ML IJ SOLN
INTRAMUSCULAR | Status: AC
  Filled 2011-12-13: qty 1

## 2011-12-13 MED ORDER — DOCUSATE SODIUM 100 MG PO CAPS
100.0000 mg | ORAL_CAPSULE | Freq: Two times a day (BID) | ORAL | Status: DC
  Administered 2011-12-13 – 2011-12-15 (×4): 100 mg via ORAL
  Filled 2011-12-13 (×6): qty 1

## 2011-12-13 MED ORDER — SENNOSIDES-DOCUSATE SODIUM 8.6-50 MG PO TABS: 1.0000 | ORAL_TABLET | Freq: Every evening | ORAL | Status: DC | PRN

## 2011-12-13 MED ORDER — FENTANYL CITRATE 0.05 MG/ML IJ SOLN
INTRAMUSCULAR | Status: DC | PRN
  Administered 2011-12-13 (×2): 50 ug via INTRAVENOUS
  Administered 2011-12-13 (×2): 100 ug via INTRAVENOUS
  Administered 2011-12-13 (×2): 50 ug via INTRAVENOUS

## 2011-12-13 MED ORDER — COLESEVELAM HCL 3.75 G PO PACK: 3.7500 g | PACK | Freq: Every day | ORAL | Status: DC

## 2011-12-13 MED ORDER — BUPIVACAINE HCL (PF) 0.25 % IJ SOLN
INTRAMUSCULAR | Status: DC | PRN
  Administered 2011-12-13: 30 mL via INTRA_ARTICULAR

## 2011-12-13 MED ORDER — METOCLOPRAMIDE HCL 5 MG/ML IJ SOLN: 5.0000 mg | Freq: Three times a day (TID) | INTRAMUSCULAR | Status: DC | PRN

## 2011-12-13 MED ORDER — METHOCARBAMOL 500 MG PO TABS
500.0000 mg | ORAL_TABLET | Freq: Four times a day (QID) | ORAL | Status: DC | PRN
  Administered 2011-12-13 – 2011-12-15 (×5): 500 mg via ORAL
  Filled 2011-12-13 (×5): qty 1

## 2011-12-13 MED ORDER — SODIUM CHLORIDE 0.9 % IR SOLN
Status: DC | PRN
  Administered 2011-12-13: 3000 mL

## 2011-12-13 MED ORDER — METOCLOPRAMIDE HCL 10 MG PO TABS: 5.0000 mg | ORAL_TABLET | Freq: Three times a day (TID) | ORAL | Status: DC | PRN

## 2011-12-13 MED ORDER — PHENOL 1.4 % MT LIQD: 1.0000 | OROMUCOSAL | Status: DC | PRN

## 2011-12-13 MED ORDER — HYDROMORPHONE 0.3 MG/ML IV SOLN
INTRAVENOUS | Status: AC
  Administered 2011-12-13: 11:00:00
  Filled 2011-12-13: qty 25

## 2011-12-13 MED ORDER — ARIPIPRAZOLE 2 MG PO TABS
2.0000 mg | ORAL_TABLET | Freq: Every day | ORAL | Status: DC
  Administered 2011-12-14 – 2011-12-15 (×2): 2 mg via ORAL
  Filled 2011-12-13 (×2): qty 1

## 2011-12-13 MED ORDER — DESVENLAFAXINE SUCCINATE ER 50 MG PO TB24
50.0000 mg | ORAL_TABLET | Freq: Every day | ORAL | Status: DC
  Administered 2011-12-14 – 2011-12-15 (×2): 50 mg via ORAL
  Filled 2011-12-13 (×2): qty 1

## 2011-12-13 MED ORDER — ONDANSETRON HCL 4 MG/2ML IJ SOLN
INTRAMUSCULAR | Status: DC | PRN
  Administered 2011-12-13: 4 mg via INTRAVENOUS

## 2011-12-13 MED ORDER — SIMVASTATIN 40 MG PO TABS
40.0000 mg | ORAL_TABLET | Freq: Every evening | ORAL | Status: DC
  Administered 2011-12-13 – 2011-12-14 (×2): 40 mg via ORAL
  Filled 2011-12-13 (×3): qty 1

## 2011-12-13 MED ORDER — ONDANSETRON HCL 4 MG/2ML IJ SOLN: 4.0000 mg | Freq: Four times a day (QID) | INTRAMUSCULAR | Status: DC | PRN

## 2011-12-13 MED ORDER — NALOXONE HCL 0.4 MG/ML IJ SOLN: 0.4000 mg | INTRAMUSCULAR | Status: DC | PRN

## 2011-12-13 MED ORDER — DIPHENHYDRAMINE HCL 50 MG/ML IJ SOLN: 12.5000 mg | Freq: Four times a day (QID) | INTRAMUSCULAR | Status: DC | PRN

## 2011-12-13 MED ORDER — SODIUM CHLORIDE 0.9 % IJ SOLN: 9.0000 mL | INTRAMUSCULAR | Status: DC | PRN

## 2011-12-13 MED ORDER — ACETAMINOPHEN 10 MG/ML IV SOLN
INTRAVENOUS | Status: DC | PRN
  Administered 2011-12-13: 1000 mg via INTRAVENOUS

## 2011-12-13 MED ORDER — DIPHENHYDRAMINE HCL 12.5 MG/5ML PO ELIX
12.5000 mg | ORAL_SOLUTION | Freq: Four times a day (QID) | ORAL | Status: DC | PRN
  Administered 2011-12-13: 12.5 mg via ORAL
  Filled 2011-12-13: qty 10

## 2011-12-13 SURGICAL SUPPLY — 56 items
BANDAGE ELASTIC 6 VELCRO ST LF (GAUZE/BANDAGES/DRESSINGS) ×2 IMPLANT
BANDAGE ESMARK 6X9 LF (GAUZE/BANDAGES/DRESSINGS) ×1 IMPLANT
BLADE SAG 18X100X1.27 (BLADE) ×4 IMPLANT
BNDG ESMARK 6X9 LF (GAUZE/BANDAGES/DRESSINGS) ×2
BOOTCOVER CLEANROOM LRG (PROTECTIVE WEAR) ×4 IMPLANT
BOWL SMART MIX CTS (DISPOSABLE) ×2 IMPLANT
CEMENT BONE SIMPLEX SPEEDSET (Cement) ×4 IMPLANT
CLOTH BEACON ORANGE TIMEOUT ST (SAFETY) ×2 IMPLANT
COVER BACK TABLE 24X17X13 BIG (DRAPES) ×2 IMPLANT
COVER SURGICAL LIGHT HANDLE (MISCELLANEOUS) ×2 IMPLANT
CUFF TOURNIQUET SINGLE 34IN LL (TOURNIQUET CUFF) ×2 IMPLANT
DRAPE EXTREMITY T 121X128X90 (DRAPE) ×2 IMPLANT
DRAPE PROXIMA HALF (DRAPES) ×2 IMPLANT
DRAPE U-SHAPE 47X51 STRL (DRAPES) ×2 IMPLANT
DRSG PAD ABDOMINAL 8X10 ST (GAUZE/BANDAGES/DRESSINGS) ×2 IMPLANT
DURAPREP 26ML APPLICATOR (WOUND CARE) ×2 IMPLANT
ELECT CAUTERY BLADE 6.4 (BLADE) ×2 IMPLANT
ELECT REM PT RETURN 9FT ADLT (ELECTROSURGICAL) ×2
ELECTRODE REM PT RTRN 9FT ADLT (ELECTROSURGICAL) ×1 IMPLANT
EVACUATOR 1/8 PVC DRAIN (DRAIN) ×2 IMPLANT
FACESHIELD LNG OPTICON STERILE (SAFETY) ×2 IMPLANT
GAUZE XEROFORM 5X9 LF (GAUZE/BANDAGES/DRESSINGS) ×2 IMPLANT
GLOVE BIOGEL PI IND STRL 8 (GLOVE) ×1 IMPLANT
GLOVE BIOGEL PI INDICATOR 8 (GLOVE) ×1
GLOVE ORTHO TXT STRL SZ7.5 (GLOVE) ×2 IMPLANT
GOWN PREVENTION PLUS XLARGE (GOWN DISPOSABLE) ×4 IMPLANT
GOWN STRL NON-REIN LRG LVL3 (GOWN DISPOSABLE) ×4 IMPLANT
GOWN STRL REIN 2XL XLG LVL4 (GOWN DISPOSABLE) ×2 IMPLANT
HANDPIECE INTERPULSE COAX TIP (DISPOSABLE) ×1
IMMOBILIZER KNEE 22 UNIV (SOFTGOODS) ×2 IMPLANT
IMMOBILIZER KNEE 24 THIGH 36 (MISCELLANEOUS) IMPLANT
IMMOBILIZER KNEE 24 UNIV (MISCELLANEOUS)
KIT BASIN OR (CUSTOM PROCEDURE TRAY) ×2 IMPLANT
KIT ROOM TURNOVER OR (KITS) ×2 IMPLANT
MANIFOLD NEPTUNE II (INSTRUMENTS) ×2 IMPLANT
NS IRRIG 1000ML POUR BTL (IV SOLUTION) ×2 IMPLANT
PACK TOTAL JOINT (CUSTOM PROCEDURE TRAY) ×2 IMPLANT
PAD ARMBOARD 7.5X6 YLW CONV (MISCELLANEOUS) ×4 IMPLANT
PAD CAST 4YDX4 CTTN HI CHSV (CAST SUPPLIES) ×1 IMPLANT
PADDING CAST COTTON 4X4 STRL (CAST SUPPLIES) ×1
PADDING CAST COTTON 6X4 STRL (CAST SUPPLIES) ×2 IMPLANT
RUBBERBAND STERILE (MISCELLANEOUS) ×2 IMPLANT
SET HNDPC FAN SPRY TIP SCT (DISPOSABLE) ×1 IMPLANT
SPONGE GAUZE 4X4 12PLY (GAUZE/BANDAGES/DRESSINGS) ×2 IMPLANT
STAPLER VISISTAT 35W (STAPLE) ×2 IMPLANT
SUCTION FRAZIER TIP 10 FR DISP (SUCTIONS) ×2 IMPLANT
SUT VIC AB 1 CTX 36 (SUTURE) ×2
SUT VIC AB 1 CTX36XBRD ANBCTR (SUTURE) ×2 IMPLANT
SUT VIC AB 2-0 CT1 27 (SUTURE) ×2
SUT VIC AB 2-0 CT1 TAPERPNT 27 (SUTURE) ×2 IMPLANT
SYR 30ML LL (SYRINGE) ×2 IMPLANT
SYR 30ML SLIP (SYRINGE) ×2 IMPLANT
TOWEL OR 17X24 6PK STRL BLUE (TOWEL DISPOSABLE) ×2 IMPLANT
TOWEL OR 17X26 10 PK STRL BLUE (TOWEL DISPOSABLE) ×2 IMPLANT
TRAY FOLEY CATH 14FR (SET/KITS/TRAYS/PACK) ×2 IMPLANT
WATER STERILE IRR 1000ML POUR (IV SOLUTION) ×6 IMPLANT

## 2011-12-13 NOTE — Progress Notes (Signed)
Orthopedic Tech Progress Note Patient Details:  [name] May 06, 1951 [id number]  CPM Left Knee CPM Left Knee: On Left Knee Flexion (Degrees): 60  Left Knee Extension (Degrees): 0    [name] T 12/13/2011, 11:20 AM

## 2011-12-13 NOTE — Progress Notes (Signed)
ANTICOAGULATION CONSULT NOTE - Initial Consult  Pharmacy Consult for Coumadin Indication: VTE prophylaxis  Allergies  Allergen Reactions  . Guaifenesin & Derivatives Itching  . Morphine And Related Itching    Hydrocodone and oxycodone  . Penicillins Itching  . Tramadol Itching    Patient Measurements:  Weight: 106kg  Vital Signs: Temp: 97 F (36.1 C) (05/15 1130) Temp src: Oral (05/15 0722) BP: 123/79 mmHg (05/15 1054) Pulse Rate: 92  (05/15 1130)  Labs: No results found for this basename: HGB:2,HCT:3,PLT:3,APTT:3,LABPROT:3,INR:3,HEPARINUNFRC:3,CREATININE:3,CKTOTAL:3,CKMB:3,TROPONINI:3 in the last 72 hours  CrCl is unknown because there is no height on file for the current visit.   Medical History: Past Medical History  Diagnosis Date  . Hypertension   . Sleep apnea     CPAP had Sleep Study ordered by Dr. Mayford Knife  . GERD (gastroesophageal reflux disease)   . Arthritis   . Depression     Medications:  Prescriptions prior to admission  Medication Sig Dispense Refill  . ARIPiprazole (ABILIFY) 2 MG tablet Take 2 mg by mouth daily.      Marland Kitchen aspirin EC 81 MG tablet Take 81 mg by mouth daily.      . benztropine (COGENTIN) 0.5 MG tablet Take 0.5 mg by mouth daily.      . Cholecalciferol (VITAMIN D-3 PO) Take 1 capsule by mouth daily.      . Colesevelam HCl (WELCHOL) 3.75 G PACK Take 3.75 g by mouth daily.      Marland Kitchen desvenlafaxine (PRISTIQ) 50 MG 24 hr tablet Take 50 mg by mouth daily.      Marland Kitchen esomeprazole (NEXIUM) 40 MG capsule Take 40 mg by mouth daily before breakfast.      . ezetimibe (ZETIA) 10 MG tablet Take 10 mg by mouth daily.      Marland Kitchen lisinopril (PRINIVIL,ZESTRIL) 5 MG tablet Take 5 mg by mouth daily.      . meperidine (DEMEROL) 50 MG tablet Take 50 mg by mouth every 6 (six) hours as needed.      . methocarbamol (ROBAXIN) 500 MG tablet Take 500 mg by mouth 2 (two) times daily as needed. For muscle pain      . naproxen (NAPROSYN) 500 MG tablet Take 500 mg by mouth daily  as needed. For pain      . OVER THE COUNTER MEDICATION Take 1 tablet by mouth 4 (four) times daily. Mega red      . simvastatin (ZOCOR) 40 MG tablet Take 40 mg by mouth every evening.      . zolpidem (AMBIEN) 10 MG tablet Take 10 mg by mouth at bedtime.        Assessment: 60yof to start Coumadin for VTE prophylaxis s/p TKA. Patient will also be receiving Lovenox 30mg  q12h until INR >1.8 per MD orders.  - Baseline INR: 1.02 - Baseline CBC wnl - Warfarin points: 4  Goal of Therapy:  INR 2-3 Monitor platelets by anticoagulation protocol: Yes   Plan:  1. Coumadin 7.5mg  po x 1 today 2. Daily PT/INR 3. Coumadin educational book/video  Cleon Dew [phone] 12/13/2011,1:07 PM

## 2011-12-13 NOTE — Progress Notes (Signed)
Report to [employer]  As caregiver

## 2011-12-13 NOTE — Transfer of Care (Signed)
Immediate Anesthesia Transfer of Care Note  Patient: [name]  Procedure(s) Performed: Procedure(s) (LRB): TOTAL KNEE ARTHROPLASTY (Left)  Patient Location: PACU  Anesthesia Type: General and Regional  Level of Consciousness: awake, alert , oriented and sedated  Airway & Oxygen Therapy: Patient Spontanous Breathing and Patient connected to nasal cannula oxygen  Post-op Assessment: Report given to PACU RN, Post -op Vital signs reviewed and stable and Patient moving all extremities  Post vital signs: Reviewed and stable  Complications: No apparent anesthesia complications

## 2011-12-13 NOTE — Brief Op Note (Signed)
12/13/2011  12:51 PM  PATIENT:  [name]  61 y.o. female  PRE-OPERATIVE DIAGNOSIS:  Degenerative joint disease, left knee  POST-OPERATIVE DIAGNOSIS:  Degenerative joint disease, left knee  PROCEDURE:  Procedure(s) (LRB): TOTAL KNEE ARTHROPLASTY (Left)  SURGEON:  Surgeon(s) and Role:    * Loreta Ave, MD - Primary  PHYSICIAN ASSISTANT: Zonia Kief M     ANESTHESIA:   regional and general  EBL:  Total I/O In: 1600 [I.V.:1600] Out: 350 [Urine:300; Blood:50]  SPECIMEN:  No Specimen  DISPOSITION OF SPECIMEN:  N/A  COUNTS:  YES  TOURNIQUET:   Total Tourniquet Time Documented: Thigh (Left) - 67 minutes  PATIENT DISPOSITION:  PACU - hemodynamically stable.

## 2011-12-13 NOTE — Anesthesia Procedure Notes (Signed)
Anesthesia Regional Block:  Femoral nerve block  Pre-Anesthetic Checklist: ,, timeout performed, Correct Patient, Correct Site, Correct Laterality, Correct Procedure, Correct Position, site marked, Risks and benefits discussed, pre-op evaluation,  At surgeon's request and post-op pain management  Laterality: Left  Prep: Maximum Sterile Barrier Precautions used and chloraprep       Needles:  Injection technique: Single-shot  Needle Type: Echogenic Stimulator Needle      Needle Gauge: 22 and 22 G    Additional Needles:  Procedures: ultrasound guided and nerve stimulator Femoral nerve block  Nerve Stimulator or Paresthesia:  Response: Patellar respose, 0.4 mA,   Additional Responses:   Narrative:  Start time: 12/13/2011 7:00 AM End time: 12/13/2011 10:12 AM Injection made incrementally with aspirations every 5 mL. Anesthesiologist: Nehemyah Foushee,MD  Additional Notes: 2% Lidocaine skin wheel.   Femoral nerve block

## 2011-12-13 NOTE — Interval H&P Note (Signed)
History and Physical Interval Note:  12/13/2011 8:17 AM  [name]  has presented today for surgery, with the diagnosis of DJD LEFT KNEE  The various methods of treatment have been discussed with the patient and family. After consideration of risks, benefits and other options for treatment, the patient has consented to  Procedure(s) (LRB): TOTAL KNEE ARTHROPLASTY (Left) as a surgical intervention .  The patients' history has been reviewed, patient examined, no change in status, stable for surgery.  I have reviewed the patients' chart and labs.  Questions were answered to the patient's satisfaction.     [name]

## 2011-12-13 NOTE — Preoperative (Signed)
Beta Blockers   Reason not to administer Beta Blockers:Not Applicable 

## 2011-12-13 NOTE — Anesthesia Postprocedure Evaluation (Signed)
  Anesthesia Post-op Note  Patient: [name]  Procedure(s) Performed: Procedure(s) (LRB): TOTAL KNEE ARTHROPLASTY (Left)  Patient Location: PACU  Anesthesia Type: GA combined with regional for post-op pain  Level of Consciousness: awake  Airway and Oxygen Therapy: Patient Spontanous Breathing and Patient connected to nasal cannula oxygen  Post-op Pain: mild  Post-op Assessment: Post-op Vital signs reviewed, Patient's Cardiovascular Status Stable, Respiratory Function Stable, Patent Airway and No signs of Nausea or vomiting  Post-op Vital Signs: Reviewed and stable  Complications: No apparent anesthesia complications

## 2011-12-13 NOTE — Anesthesia Preprocedure Evaluation (Signed)
Anesthesia Evaluation  Patient identified by MRN, date of birth, ID band Patient awake    Reviewed: Allergy & Precautions, H&P , NPO status , Patient's Chart, lab work & pertinent test results  Airway Mallampati: III TM Distance: >3 FB Neck ROM: Full    Dental No notable dental hx. (+) Teeth Intact and Dental Advisory Given   Pulmonary sleep apnea and Continuous Positive Airway Pressure Ventilation ,  breath sounds clear to auscultation  Pulmonary exam normal       Cardiovascular hypertension, On Medications Rhythm:Regular Rate:Normal     Neuro/Psych PSYCHIATRIC DISORDERS negative neurological ROS     GI/Hepatic Neg liver ROS, GERD-  Medicated and Controlled,  Endo/Other  negative endocrine ROS  Renal/GU negative Renal ROS  negative genitourinary   Musculoskeletal   Abdominal   Peds  Hematology negative hematology ROS (+)   Anesthesia Other Findings   Reproductive/Obstetrics negative OB ROS                           Anesthesia Physical Anesthesia Plan  ASA: III  Anesthesia Plan: General   Post-op Pain Management:    Induction: Intravenous  Airway Management Planned: LMA  Additional Equipment:   Intra-op Plan:   Post-operative Plan: Extubation in OR  Informed Consent: I have reviewed the patients History and Physical, chart, labs and discussed the procedure including the risks, benefits and alternatives for the proposed anesthesia with the patient or authorized representative who has indicated his/her understanding and acceptance.   Dental advisory given  Plan Discussed with: CRNA  Anesthesia Plan Comments:         Anesthesia Quick Evaluation

## 2011-12-13 NOTE — Progress Notes (Signed)
Placed pt on auto cpap with patients home mask. Pt agreed to wear PCA nasal cannula under cpap mask and is tolerating well at this time.

## 2011-12-13 NOTE — Progress Notes (Signed)
UR COMPLETED  

## 2011-12-14 ENCOUNTER — Encounter (HOSPITAL_COMMUNITY): Payer: Self-pay | Admitting: General Practice

## 2011-12-14 LAB — CBC
HCT: 33.3 % — ABNORMAL LOW (ref 36.0–46.0)
Hemoglobin: 10.6 g/dL — ABNORMAL LOW (ref 12.0–15.0)
MCHC: 31.8 g/dL (ref 30.0–36.0)
RDW: 14.5 % (ref 11.5–15.5)
WBC: 6 10*3/uL (ref 4.0–10.5)

## 2011-12-14 LAB — BASIC METABOLIC PANEL
Chloride: 103 mEq/L (ref 96–112)
GFR calc Af Amer: >90 mL/min (ref >90)
GFR calc non Af Amer: >90 mL/min (ref >90)
Glucose, Bld: 134 mg/dL — ABNORMAL HIGH (ref 70–99)
Potassium: 3.8 mEq/L (ref 3.5–5.1)
Sodium: 139 mEq/L (ref 135–145)

## 2011-12-14 LAB — PROTIME-INR
INR: 1.18 (ref 0.00–1.49)
Prothrombin Time: 15.2 seconds (ref 11.6–15.2)

## 2011-12-14 MED ORDER — MEPERIDINE HCL 50 MG PO TABS
50.0000 mg | ORAL_TABLET | ORAL | Status: DC | PRN
  Administered 2011-12-14 – 2011-12-15 (×4): 50 mg via ORAL
  Filled 2011-12-14 (×4): qty 1

## 2011-12-14 MED ORDER — WARFARIN SODIUM 5 MG PO TABS
5.0000 mg | ORAL_TABLET | Freq: Once | ORAL | Status: AC
  Administered 2011-12-14: 5 mg via ORAL
  Filled 2011-12-14: qty 1

## 2011-12-14 NOTE — Progress Notes (Signed)
Clinical Social Work  CSW spoke with [facility] who stated they needed additional information. SNF stated they would have to get prior authorization from insurance before admission. CSW faxed additional clinicals via [facility]. SNF will contact CSW later in the day to determine if they can make a bed offer.  [place], [place] [phone] (Coverage Lovette Cliche)

## 2011-12-14 NOTE — Clinical Social Work Placement (Addendum)
    Clinical Social Work Department CLINICAL SOCIAL WORK PLACEMENT NOTE 12/14/2011  Patient:  [name], [name]  Account Number:  [id number] Admit date:  12/13/2011  Clinical Social Worker:  Unk Lightning, LCSW  Date/time:  12/14/2011 10:45 AM  Clinical Social Work is seeking post-discharge placement for this patient at the following level of care:   SKILLED NURSING   (*CSW will update this form in Epic as items are completed)   12/14/2011  Patient/family provided with [facility] Department of Clinical Social Work's list of facilities offering this level of care within the geographic area requested by the patient (or if unable, by the patient's family).  12/14/2011  Patient/family informed of their freedom to choose among providers that offer the needed level of care, that participate in Medicare, Medicaid or managed care program needed by the patient, have an available bed and are willing to accept the patient.  12/14/2011  Patient/family informed of [facility]' ownership interest in [facility], as well as of the fact that they are under no obligation to receive care at this facility.  PASARR submitted to EDS on 12/14/2011 PASARR number received from EDS on 12/15/2011  FL2 transmitted to all facilities in geographic area requested by pt/family on  12/14/2011 FL2 transmitted to all facilities within larger geographic area on   Patient informed that his/her managed care company has contracts with or will negotiate with  certain facilities, including the following:     Patient/family informed of bed offers received:  12/14/2011 Patient chooses bed at [facility] Physician recommends and patient chooses bed at    Patient to be transferred to  on  12/15/2011 Patient to be transferred to facility by [facility]  The following physician request were entered in Epic:   Additional Comments: 12/14/11-CSW attempted to submitt pasarr but website is currently down

## 2011-12-14 NOTE — Care Management Note (Signed)
    Page 1 of 1   12/14/2011     10:55:05 AM   CARE MANAGEMENT NOTE 12/14/2011  Patient:  [name], [name]   Account Number:  [id number]  Date Initiated:  12/13/2011  Documentation initiated by:  Vance Peper  Subjective/Objective Assessment:   61 yr old female s/p left total knee arthroplasty  Lives alone in 3 level condo     Action/Plan:   patient is for SNF for shortterm rehab. Patient was preoperatively setup with [facility]. to provide home health.   Anticipated DC Date:  12/15/2011   Anticipated DC Plan:  SKILLED NURSING FACILITY  In-house referral  Clinical Social Worker      DC Planning Services  CM consult      Choice offered to / List presented to:             Status of service:  In process, will continue to follow Medicare Important Message given?  NO (If response is "NO", the following Medicare IM given date fields will be blank) Date Medicare IM given:   Date Additional Medicare IM given:    Discharge Disposition:    Per UR Regulation:  Reviewed for med. necessity/level of care/duration of stay  If discussed at Long Length of Stay Meetings, dates discussed:    Comments:  12/14/11 10:52  Anette Guarneri RN/CM spoke with patient, per patient she lives in 3 level condo, lives alone and would like SNF, per Patient she has been to [facility] after previous surgery and would like to go there for short term SNF rehab. CSW referral made.

## 2011-12-14 NOTE — Progress Notes (Signed)
ANTICOAGULATION CONSULT NOTE - Follow Up Consult  Pharmacy Consult for Coumadin Indication: VTE prophylaxis  Allergies  Allergen Reactions  . Guaifenesin & Derivatives Itching  . Morphine And Related Itching    Hydrocodone and oxycodone  . Penicillins Itching  . Tramadol Itching    Patient Measurements:   Heparin Dosing Weight:   Vital Signs: Temp: 99 F (37.2 C) (05/16 0727) BP: 122/59 mmHg (05/16 0510) Pulse Rate: 101  (05/16 0727)  Labs:  Basename 12/14/11 0500  HGB 10.6*  HCT 33.3*  PLT 187  APTT --  LABPROT 15.2  INR 1.18  HEPARINUNFRC --  CREATININE 0.63  CKTOTAL --  CKMB --  TROPONINI --    CrCl is unknown because there is no height on file for the current visit.   Medications:  Scheduled:    . ARIPiprazole  2 mg Oral Daily  . benztropine  0.5 mg Oral Daily  . colesevelam  3,750 mg Oral Q breakfast  . coumadin book   Does not apply Once  . desvenlafaxine  50 mg Oral Daily  . docusate sodium  100 mg Oral BID  . enoxaparin  30 mg Subcutaneous Q12H  . ezetimibe  10 mg Oral Daily  . HYDROmorphone      . methocarbamol (ROBAXIN) IV  500 mg Intravenous To PACU  . pantoprazole  40 mg Oral Daily  . simvastatin  40 mg Oral QPM  . vancomycin  1,000 mg Intravenous Q12H  . warfarin  7.5 mg Oral ONCE-1800  . warfarin   Does not apply Once  . Warfarin - Pharmacist Dosing Inpatient   Does not apply q1800  . zolpidem  10 mg Oral QHS  . DISCONTD: Colesevelam HCl  3.75 g Oral Daily  . DISCONTD: HYDROmorphone PCA 0.3 mg/mL   Intravenous Q4H    Assessment: [name] to start Coumadin for VTE prophylaxis s/p TKA. Patient will also be receiving Lovenox 30mg  q12h until INR >1.8 per MD orders.  - Baseline INR: 1.02  - Baseline CBC wnl  - Warfarin points: 4  Goal of Therapy:  INR 2-3 Monitor platelets by anticoagulation protocol: Yes   Plan:  - Coumadin 5 mg po x1 - Daily INR - Continue lovenox 30 mg sq q12h until INR >1.8   [name] [name], PharmD,  BCPS Clinical Pharmacist Pager: [phone] 12/14/2011 11:17 AM

## 2011-12-14 NOTE — Evaluation (Signed)
Physical Therapy Evaluation Patient Details Name: [name] MRN: [id number] DOB: 09-11-1950 Today's Date: 12/14/2011 Time: 1478-2956 PT Time Calculation (min): 30 min  PT Assessment / Plan / Recommendation Clinical Impression  pt rpesents s/p L TKA.  pt lives alone and will need ST-SNF at D/C to maximize Independence prior to being home alone.      PT Assessment  Patient needs continued PT services    Follow Up Recommendations  Skilled nursing facility    Barriers to Discharge None      lEquipment Recommendations  Defer to next venue    Recommendations for Other Services     Frequency 7X/week    Precautions / Restrictions Precautions Precautions: Fall;Knee Precaution Booklet Issued: Yes (comment) Required Braces or Orthoses: Knee Immobilizer - Left Knee Immobilizer - Left: On except when in CPM Restrictions Weight Bearing Restrictions: Yes LLE Weight Bearing: Weight bearing as tolerated   Pertinent Vitals/Pain 6/10 l KNEE WITH MOBILITY      Mobility  Bed Mobility Bed Mobility: Supine to Sit;Sitting - Scoot to Edge of Bed Supine to Sit: 4: Min assist Sitting - Scoot to Edge of Bed: 4: Min assist Details for Bed Mobility Assistance: A with L LE support only.   Transfers Transfers: Sit to Stand;Stand to Sit Sit to Stand: 3: Mod assist;With upper extremity assist;From bed Stand to Sit: 4: Min assist;With upper extremity assist;With armrests;To chair/3-in-1 Details for Transfer Assistance: cues for use of UEs, anterior wt shift with sit to stand, L LE positioning with Stand to sit.   Ambulation/Gait Ambulation/Gait Assistance: 4: Min guard Ambulation Distance (Feet): 50 Feet Assistive device: Rolling walker Ambulation/Gait Assistance Details: cues for sequencing LEs, upright posture, positioning in RW.   Gait Pattern: Step-to pattern;Decreased step length - right;Decreased stance time - left;Trunk flexed Stairs: No Wheelchair Mobility Wheelchair Mobility:  No    Exercises Total Joint Exercises Ankle Circles/Pumps: AROM;Both;10 reps Quad Sets: AROM;Both;10 reps Long Arc Quad: AAROM;Left;10 reps Knee Flexion: AAROM;Left;10 reps Goniometric ROM: AAROM ~10-65degrees.     PT Diagnosis: Abnormality of gait;Acute pain  PT Problem List: Decreased strength;Decreased range of motion;Decreased activity tolerance;Decreased balance;Decreased mobility;Decreased knowledge of use of DME;Decreased knowledge of precautions;Pain PT Treatment Interventions: DME instruction;Gait training;Stair training;Functional mobility training;Therapeutic activities;Therapeutic exercise;Balance training;Patient/family education   PT Goals Acute Rehab PT Goals PT Goal Formulation: With patient Time For Goal Achievement: 12/21/11 Potential to Achieve Goals: Good Pt will go Supine/Side to Sit: Independently PT Goal: Supine/Side to Sit - Progress: Goal set today Pt will go Sit to Supine/Side: Independently PT Goal: Sit to Supine/Side - Progress: Goal set today Pt will go Sit to Stand: with modified independence PT Goal: Sit to Stand - Progress: Goal set today Pt will go Stand to Sit: with modified independence PT Goal: Stand to Sit - Progress: Goal set today Pt will Ambulate: >150 feet;with modified independence;with rolling walker PT Goal: Ambulate - Progress: Goal set today Pt will Perform Home Exercise Program: Independently PT Goal: Perform Home Exercise Program - Progress: Goal set today  Visit Information  Last PT Received On: 12/14/11 Assistance Needed: +1    Subjective Data  Subjective: I went to [facility] after my last knee surgery.   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     Prior Functioning  Home Living Lives With: Alone Available Help at Discharge:  (None) Type of Home:  (Townhome) Home Access: Stairs to enter Home Layout: Multi-level Additional Comments: pt plans to D/C to [facility] for rehab prior to  D/C home.   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   Communication Communication: No difficulties    Cognition  Overall Cognitive Status: Appears within functional limits for tasks assessed/performed Arousal/Alertness: Awake/alert Orientation Level: Oriented X4 / Intact Behavior During Session: [facility] for tasks performed    Extremity/Trunk Assessment Right Lower Extremity Assessment RLE ROM/Strength/Tone: Within functional levels RLE Sensation: WFL - Light Touch Left Lower Extremity Assessment LLE ROM/Strength/Tone: Deficits LLE ROM/Strength/Tone Deficits: AAROM ~10-65degrees.   LLE Sensation: WFL - Light Touch   Balance Balance Balance Assessed: No  End of Session PT - End of Session Equipment Utilized During Treatment: Gait belt;Left knee immobilizer Activity Tolerance: Patient tolerated treatment well Patient left: in chair;with call bell/phone within reach Nurse Communication: Mobility status CPM Left Knee CPM Left Knee: [address]   Sunny Schlein,  [phone] 12/14/2011, 9:14 AM

## 2011-12-14 NOTE — Progress Notes (Signed)
Orthopedic Tech Progress Note Patient Details:  [name] 06-27-1951 [id number]  Patient ID: [name], female   DOB: 29-Dec-1950, 61 y.o.   MRN: [id number] Confirmed patient has knee immobilizer.  Glenn Gullickson T 12/14/2011, 11:15 AM

## 2011-12-14 NOTE — Progress Notes (Signed)
Subjective: Doing well.  Pain controlled but c/o itching from dilaudid pca.  No cp, sob.     Objective: Vital signs in last 24 hours: Temp:  [97 F (36.1 C)-100.9 F (38.3 C)] 99 F (37.2 C) (05/16 0727) Pulse Rate:  [92-113] 101  (05/16 0727) Resp:  [10-18] 18  (05/16 0510) BP: (96-129)/(59-65) 122/59 mmHg (05/16 0510) SpO2:  [93 %-100 %] 97 % (05/16 0510) FiO2 (%):  [99 %] 99 % (05/15 1638)  Intake/Output from previous day: 05/15 0701 - 05/16 0700 In: 1800 [I.V.:1600; IV Piggyback:200] Out: 3250 [Urine:3050; Drains:150; Blood:50] Intake/Output this shift:     Basename 12/14/11 0500  HGB 10.6*    Basename 12/14/11 0500  WBC 6.0  RBC 4.05  HCT 33.3*  PLT 187    Basename 12/14/11 0500  NA 139  K 3.8  CL 103  CO2 26  BUN 8  CREATININE 0.63  GLUCOSE 134*  CALCIUM 8.8    Basename 12/14/11 0500  LABPT --  INR 1.18    Exam:  Dressing c/d/i.  Calf nt, nvi.    Assessment/Plan: Anticipate transfer to [facility] Friday or Saturday.  D/c dilaudid.  Will try demerol po.     Jaiquan Temme M 12/14/2011, 11:07 AM

## 2011-12-14 NOTE — Op Note (Signed)
NAMELILLYONA, POLASEK            ACCOUNT NO.:  [id number]  MEDICAL RECORD NO.:  [id number]  LOCATION:  5041                         FACILITY:  [facility]  PHYSICIAN:  Loreta Ave, M.D. DATE OF BIRTH:  1951-01-08  DATE OF PROCEDURE:  12/13/2011 DATE OF DISCHARGE:                              OPERATIVE REPORT   PREOPERATIVE DIAGNOSIS:  Left knee end-stage degenerative arthritis, varus alignment.  POSTOPERATIVE DIAGNOSIS:  Left knee end-stage degenerative arthritis, varus alignment.  PROCEDURE:  Modified minimally invasive left total knee replacement with Stryker Triathlon prosthesis.  Soft tissue balancing.  SURGEON:  Loreta Ave, MD  ASSISTANT:  Genene Churn. Barry Dienes, [place], present throughout the entire case and necessary for timely completion of the procedure.  ANESTHESIA:  General.  BLOOD LOSS:  Minimal.  SPECIMENS:  None.  COUNTS:  None.  COMPLICATION:  None.  DRESSINGS:  Soft compressive with a knee immobilizer.  DRAINS:  Hemovac x1.  TOURNIQUET TIME:  1 hour.  PROCEDURE:  The patient was brought to operating room, placed on the operating room table in supine position.  After adequate anesthesia had been obtained, knee examined.  5 degrees of varus, not very correctable. Almost full extension and flexion 100 degrees.  Tourniquet applied. Prepped and draped in usual sterile fashion.  Exsanguinated with elevation, Esmarch.  Tourniquet inflated to 350 mmHg.  Straight incision above the patella down to tibial tubercle.  Medial arthrotomy, vastus splitting, preserving quad tendon.  Grade 4 changes throughout.  Medial capsule released.  Remnants of menisci, cruciate ligaments, spurs removed.  Distal femur exposed.  Intramedullary guide placed.  A 10 mm of resection 5 degrees of valgus.  Using epicondylar axis, the femur was sized, cut, and fitted for a pegged posterior stabilized #3 femoral component.  Attention turned to the proximal tibia.  A 3-degree posterior  slope cut, extramedullary guide.  Size #4 component.  Debris cleared throughout the knee in flexion and extension including posterior recess.  Patella exposed, posterior 10 mm swivel off and then drilled, sized, and fitted for a 35-mm component.  Copious irrigation throughout the entire knee.  Trials put in place.  A #3 on the femur, #4 on the tibia, 9 mm insert and 35 on the patella.  With this, excellent biomechanical axis.  Good patellofemoral tracking.  Nicely balanced in flexion and extension.  Tibia was marked for rotation and hand reamed. All trials removed.  Copious irrigation with a pulsatile irrigating device.  Cement prepared and placed on all components.  Firmly seated. Polyethylene attached to tibia, knee reduced.  Patella held with clamp. Once cement hardened, knee was reexamined.  Excellent biomechanical axis.  Good balance in flexion and extension.  Good patellofemoral tracking.  Hemovac placed and brought out through a separate stab wound. Arthrotomy closed with #1 Vicryl.  Skin and subcutaneous tissue with Vicryl and staples.  Sterile compressive dressing applied.  Tourniquet deflated and removed.  Knee immobilizer applied.  Anesthesia reversed. Brought to recovery room.  Tolerated surgery well.  No complications.     Loreta Ave, M.D.     DFM/MEDQ  D:  12/13/2011  T:  12/14/2011  Job:  [id number]

## 2011-12-14 NOTE — Progress Notes (Signed)
PT Cancellation Note  Treatment cancelled today due to pt declined pm session secondary to just getting back to bed and wanting to start on CPM.  Will follow-up tomorrow.    [name], [place] [phone] 12/14/2011, 2:10 PM

## 2011-12-14 NOTE — Evaluation (Signed)
Occupational Therapy Evaluation Patient Details Name: [name] MRN: [id number] DOB: 08/01/1950 Today's Date: 12/14/2011 Time: 4098-1191 OT Time Calculation (min): 31 min  OT Assessment / Plan / Recommendation Clinical Impression  Pt presents POD 1 LTKR. Skilled OT recommended to maximize I w/BADLs to min A/supervision level in prep for d/c to next venue of care.    OT Assessment  Patient needs continued OT Services    Follow Up Recommendations  Skilled nursing facility    Barriers to Discharge Inaccessible home environment;Decreased caregiver support    Equipment Recommendations  Defer to next venue    Recommendations for Other Services    Frequency  Min 1X/week    Precautions / Restrictions Precautions Precautions: Knee;Fall Precaution Booklet Issued: Yes (comment) Required Braces or Orthoses: Knee Immobilizer - Left Knee Immobilizer - Left: On except when in CPM Restrictions Weight Bearing Restrictions: No LLE Weight Bearing: Weight bearing as tolerated   Pertinent Vitals/Pain     ADL  Eating/Feeding: Performed;Independent Where Assessed - Eating/Feeding: Bed level Grooming: Simulated;Set up Where Assessed - Grooming: Unsupported sitting Upper Body Bathing: Simulated;Set up Where Assessed - Upper Body Bathing: Unsupported sitting Lower Body Bathing: Simulated;Moderate assistance Where Assessed - Lower Body Bathing: Supported sit to stand Upper Body Dressing: Simulated;Set up Where Assessed - Upper Body Dressing: Unsupported sitting Lower Body Dressing: Simulated;Maximal assistance Where Assessed - Lower Body Dressing: Sopported sit to stand Toilet Transfer: Performed;Minimal assistance Toilet Transfer Method: Sit to stand Toilet Transfer Equipment: Raised toilet seat with arms (or 3-in-1 over toilet) Toileting - Clothing Manipulation and Hygiene: Simulated;Minimal assistance Where Assessed - Toileting Clothing Manipulation and Hygiene: Sit to stand from  3-in-1 or toilet Tub/Shower Transfer Method: Not assessed Transfers/Ambulation Related to ADLs: Pt ambulated to the bathroom with min A and vcs for step sequence.    OT Diagnosis: Generalized weakness  OT Problem List: Decreased activity tolerance;Decreased safety awareness;Decreased knowledge of use of DME or AE;Pain OT Treatment Interventions: Self-care/ADL training;Therapeutic activities;DME and/or AE instruction;Patient/family education   OT Goals Acute Rehab OT Goals OT Goal Formulation: With patient Time For Goal Achievement: 12/21/11 Potential to Achieve Goals: Good ADL Goals Pt Will Perform Grooming: with supervision;Standing at sink ADL Goal: Grooming - Progress: Goal set today Pt Will Perform Lower Body Bathing: with supervision;Sit to stand from chair;Sit to stand from bed ADL Goal: Lower Body Bathing - Progress: Goal set today Pt Will Perform Lower Body Dressing: with min assist;Sit to stand from chair;Sit to stand from bed ADL Goal: Lower Body Dressing - Progress: Goal set today Pt Will Transfer to Toilet: with supervision;3-in-1;Comfort height toilet;Ambulation ADL Goal: Toilet Transfer - Progress: Goal set today Pt Will Perform Toileting - Clothing Manipulation: with supervision;Sitting on 3-in-1 or toilet;Standing ADL Goal: Toileting - Clothing Manipulation - Progress: Goal set today Pt Will Perform Toileting - Hygiene: with supervision;Sit to stand from 3-in-1/toilet ADL Goal: Toileting - Hygiene - Progress: Goal set today  Visit Information  Last OT Received On: 12/14/11 Assistance Needed: +1    Subjective Data  Subjective: I'm hopefully going to [facility] for rehab. Patient Stated Goal: Not asked   Prior Functioning  Home Living Lives With: Alone Available Help at Discharge: Skilled Nursing Facility Type of Home:  Rosanne Sack) Home Access: Stairs to enter Home Layout: Multi-level Additional Comments: pt plans to D/C to [facility] for rehab prior to D/C home.    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  Communication Communication: No difficulties Dominant Hand: Right    Cognition  Overall Cognitive Status: Appears within functional limits for tasks assessed/performed Arousal/Alertness: Awake/alert Orientation Level: Appears intact for tasks assessed Behavior During Session: [facility] for tasks performed    Extremity/Trunk Assessment Right Upper Extremity Assessment RUE ROM/Strength/Tone: Within functional levels Left Upper Extremity Assessment LUE ROM/Strength/Tone: Within functional levels   Mobility Bed Mobility Bed Mobility: Sit to Supine;Scooting to HOB Supine to Sit: 4: Min assist;HOB elevated;With rails Sitting - Scoot to Edge of Bed: 4: Min guard Sit to Supine: 4: Min assist;HOB flat;With rail (educated pt how to use sheet as a leg lifter.) Scooting to [facility]: With trapeze;3: Mod assist Details for Bed Mobility Assistance: Physical A needed for LLE. Cues for hand placement and technique. Transfers Sit to Stand: 3: Mod assist;From elevated surface;With upper extremity assist;With armrests;From chair/3-in-1 Stand to Sit: 4: Min assist;With upper extremity assist;With armrests;To chair/3-in-1;To bed Details for Transfer Assistance: min amt of VCs for hand placement and LLE position/management.   Exercise     Balance Balance Balance Assessed: No  End of Session OT - End of Session Equipment Utilized During Treatment: Gait belt Activity Tolerance: Patient tolerated treatment well Patient left: in bed;with call bell/phone within reach CPM Left Knee CPM Left Knee: Off   Charise Leinbach A OTR/L [phone] 12/14/2011, 12:13 PM

## 2011-12-14 NOTE — Progress Notes (Signed)
Clinical Social Work  CSW spoke with [facility] who made a bed offer. Per  SNF, they have submitted for insurance approval for dc on Friday 12/15/11. CSW informed patient of bed offer and patient was excited to admit to SNF. CSW submitted 30 day note for pasarr. CSW will continue to follow.  [place], [place] [phone] (Coverage for Lovette Cliche)

## 2011-12-14 NOTE — Clinical Social Work Psychosocial (Signed)
     Clinical Social Work Department BRIEF PSYCHOSOCIAL ASSESSMENT 12/14/2011  Patient:  [name], [name]     Account Number:  [id number]     Admit date:  12/13/2011  Clinical Social Worker:  Dennison Bulla  Date/Time:  12/14/2011 10:45 AM  Referred by:  Physician  Date Referred:  12/14/2011 Referred for  SNF Placement   Other Referral:   Interview type:  Patient Other interview type:    PSYCHOSOCIAL DATA Living Status:  ALONE Admitted from facility:   Level of care:   Primary support name:  Britta Mccreedy Primary support relationship to patient:  SIBLING Degree of support available:   Adequate    CURRENT CONCERNS Current Concerns  Post-Acute Placement   Other Concerns:    SOCIAL WORK ASSESSMENT / PLAN CSW received referral from MD stating that patient would need ST SNF at dc. CSW reviewed chart which stated PT completed evaluation and was recommending SNF as well. CSW met with patient at bedside. No visitors present.    CSW introduced myself and explained reason for consult. Patient reported she needed SNF because she lived alone in 3 story apartment. Patient reported that she has been to [facility] in the past and prefers to return there. CSW provided patient with SNF list and encouraged her to review list in case a bed was not available at [facility].    CSW completed FL2 and faxed out. CSW attempted to submit pasarr but website is down. CSW will continue to check site and will submit pasarr as soon as possible. CSW will continue to follow.   Assessment/plan status:  Psychosocial Support/Ongoing Assessment of Needs Other assessment/ plan:   Information/referral to community resources:   SNF list    PATIENTS/FAMILYS RESPONSE TO PLAN OF CARE: Patient was alert and oriented. Patient appreciative of CSW consult. Patient reports sister is involved but stated she will update sister on dc plans.

## 2011-12-15 LAB — CBC
Platelets: 175 10*3/uL (ref 150–400)
RBC: 3.81 MIL/uL — ABNORMAL LOW (ref 3.87–5.11)
RDW: 14.4 % (ref 11.5–15.5)
WBC: 6.6 10*3/uL (ref 4.0–10.5)

## 2011-12-15 LAB — BASIC METABOLIC PANEL
CO2: 29 mEq/L (ref 19–32)
Chloride: 100 mEq/L (ref 96–112)
GFR calc Af Amer: >90 mL/min (ref >90)
Potassium: 3.3 mEq/L — ABNORMAL LOW (ref 3.5–5.1)

## 2011-12-15 LAB — PROTIME-INR: INR: 1.78 — ABNORMAL HIGH (ref 0.00–1.49)

## 2011-12-15 MED ORDER — POTASSIUM CHLORIDE CRYS ER 20 MEQ PO TBCR
40.0000 meq | EXTENDED_RELEASE_TABLET | Freq: Once | ORAL | Status: AC
  Administered 2011-12-15: 40 meq via ORAL
  Filled 2011-12-15: qty 2

## 2011-12-15 MED ORDER — ENOXAPARIN SODIUM 30 MG/0.3ML ~~LOC~~ SOLN: 30.0000 mg | Freq: Two times a day (BID) | SUBCUTANEOUS | Status: DC

## 2011-12-15 MED ORDER — WARFARIN SODIUM 5 MG PO TABS: 5.0000 mg | ORAL_TABLET | Freq: Every day | ORAL | Status: DC

## 2011-12-15 MED ORDER — WARFARIN SODIUM 5 MG PO TABS
5.0000 mg | ORAL_TABLET | Freq: Once | ORAL | Status: DC
  Filled 2011-12-15: qty 1

## 2011-12-15 MED ORDER — MEPERIDINE HCL 50 MG PO TABS: 50.0000 mg | ORAL_TABLET | ORAL | Status: AC | PRN

## 2011-12-15 MED ORDER — DSS 100 MG PO CAPS: 100.0000 mg | ORAL_CAPSULE | Freq: Two times a day (BID) | ORAL | Status: AC

## 2011-12-15 MED ORDER — METHOCARBAMOL 750 MG PO TABS: 750.0000 mg | ORAL_TABLET | Freq: Four times a day (QID) | ORAL | Status: AC

## 2011-12-15 NOTE — Progress Notes (Signed)
Clinical Social Work  CSW faxed dc summary via [facility]. CSW prepared dc packet. SNF [facility]) is agreeable to admission. CSW informed RN and patient of dc and both were agreeable. CSW set up transportation via [facility] for 1300. Patient stated she would inform sister of dc. CSW is signing off.  [place], [place] [phone]

## 2011-12-15 NOTE — Progress Notes (Signed)
PT Progress Note:     12/15/11 0900  PT Visit Information  Last PT Received On 12/15/11  Assistance Needed +1  PT Time Calculation  PT Start Time 0746  PT Stop Time 0822  PT Time Calculation (min) 36 min  Subjective Data  Subjective "I just don't want to move"  Precautions  Precautions Knee;Fall  Required Braces or Orthoses Knee Immobilizer - Left  Knee Immobilizer - Left On except when in CPM  Restrictions  LLE Weight Bearing WBAT  Cognition  Overall Cognitive Status Appears within functional limits for tasks assessed/performed  Arousal/Alertness Awake/alert  Orientation Level Appears intact for tasks assessed  Behavior During Session [facility] for tasks performed  Bed Mobility  Bed Mobility Supine to Sit;Sitting - Scoot to Edge of Bed  Supine to Sit 4: Min guard;HOB flat;With rails  Sitting - Scoot to Edge of Bed 4: Min guard  Details for Bed Mobility Assistance Mod directional cues for technique & to perform as independently as possible due to pt requesting for (A) automatically.    Transfers  Transfers Sit to Stand;Stand to Sit  Sit to Stand 3: Mod assist;With upper extremity assist;From bed  Stand to Sit 4: Min assist;With upper extremity assist;To chair/3-in-1  Details for Transfer Assistance Cues for hand placement, use of UE's to control descent, & technique.  (A) to achieve standing, anterior translation of trunk over BOS.    Ambulation/Gait  Ambulation/Gait Assistance 4: Min guard  Ambulation Distance (Feet) 120 Feet  Assistive device Rolling walker  Ambulation/Gait Assistance Details Max encouragement to increase distance.  Cues for sequencing, increased upright posture, increased WBing through LLE, stay inside RW.    Gait Pattern Step-to pattern;Decreased stance time - left;Decreased step length - right;Decreased weight shift to left  Stairs No  Wheelchair Mobility  Wheelchair Mobility No  Exercises  Exercises Total Joint  Total Joint Exercises  Ankle Circles/Pumps  AROM;Both;10 reps;Supine  Quad Sets AROM;Both;10 reps;Supine  Long [employer];Left;10 reps;Seated  Knee Flexion AAROM;Left;10 reps;Seated  Straight Leg Raises AAROM;Strengthening;Left;10 reps;Supine  PT - End of Session  Equipment Utilized During Treatment Gait belt;Left knee immobilizer  Activity Tolerance Patient tolerated treatment well  Patient left in chair;with call bell/phone within reach  PT - Assessment/Plan  Comments on Treatment Session Pt improved with bed mobility & continued to require physical (A) for sit<>stand transfers & close guarding for ambulation.  Pt would continue to benefit from ST-SNF prior to d/c home due to living alone & not being Mod I level for mobility.   PT Plan Discharge plan remains appropriate  PT Frequency 7X/week  Follow Up Recommendations Skilled nursing facility  Equipment Recommended Defer to next venue  Acute Rehab PT Goals  PT Goal: Supine/Side to Sit - Progress Progressing toward goal  PT Goal: Sit to Stand - Progress Not progressing  PT Goal: Stand to Sit - Progress Not progressing  PT Goal: Ambulate - Progress Progressing toward goal  PT Goal: Perform Home Exercise Program - Progress Progressing toward goal      [name], [place] [phone] 12/15/2011

## 2011-12-15 NOTE — Progress Notes (Signed)
Subjective: Doing well.  Pain controlled.  Ready for transfer to [facility] today.  No bm yet.  Objective: Vital signs in last 24 hours: Temp:  [97.7 F (36.5 C)-100.7 F (38.2 C)] 99.8 F (37.7 C) (05/17 0547) Pulse Rate:  [102-128] 102  (05/17 0547) Resp:  [16-20] 18  (05/17 0547) BP: (115-132)/(61-89) 130/66 mmHg (05/17 0547) SpO2:  [90 %-97 %] 97 % (05/17 0547)  Intake/Output from previous day: 05/16 0701 - 05/17 0700 In: 240 [P.O.:240] Out: 625 [Urine:500; Drains:125] Intake/Output this shift:     Basename 12/15/11 0517 12/14/11 0500  HGB 9.9* 10.6*    Basename 12/15/11 0517 12/14/11 0500  WBC 6.6 6.0  RBC 3.81* 4.05  HCT 31.1* 33.3*  PLT 175 187    Basename 12/15/11 0517 12/14/11 0500  NA 139 139  K 3.3* 3.8  CL 100 103  CO2 29 26  BUN 6 8  CREATININE 0.66 0.63  GLUCOSE 118* 134*  CALCIUM 9.2 8.8    Basename 12/15/11 0517 12/14/11 0500  LABPT -- --  INR 1.78* 1.18    Exam:  Wound looks good. Staples intact.  No signs of infection.  Calf nt, nvi.  Drain removed.    Assessment/Plan: Transfer to [facility] today.  Dulcolax supp now.  kcl 40 meq po now.  [name] 12/15/2011, 8:58 AM

## 2011-12-15 NOTE — Progress Notes (Signed)
Patient ID: [name], female   DOB: 19-Jun-1951, 61 y.o.   MRN: [id number]   Priority d/c summary #[id number]

## 2011-12-15 NOTE — Progress Notes (Signed)
ANTICOAGULATION CONSULT NOTE - Follow Up Consult  Pharmacy Consult for Coumadin Indication: VTE prophylaxis  Allergies  Allergen Reactions  . Guaifenesin & Derivatives Itching  . Morphine And Related Itching    Hydrocodone and oxycodone  . Penicillins Itching  . Tramadol Itching    Patient Measurements:   Heparin Dosing Weight:   Vital Signs: Temp: 99.8 F (37.7 C) (05/17 0547) Temp src: Oral (05/17 0547) BP: 130/66 mmHg (05/17 0547) Pulse Rate: 102  (05/17 0547)  Labs:  Basename 12/15/11 0517 12/14/11 0500  HGB 9.9* 10.6*  HCT 31.1* 33.3*  PLT 175 187  APTT -- --  LABPROT 21.0* 15.2  INR 1.78* 1.18  HEPARINUNFRC -- --  CREATININE 0.66 0.63  CKTOTAL -- --  CKMB -- --  TROPONINI -- --    CrCl is unknown because there is no height on file for the current visit.  Assessment: 60yof on Coumadin for VTE prophylaxis s/p TKA. INR (1.78) is subtherapeutic but is trending up. Noted discharge orders - Coumadin 5mg  daily (dosed to INR 2-3) and Lovenox 30mg  q12h until INR therapeutic. Will order Coumadin 5mg  tonight in case discharge is delayed.  - H/H and Plts slight decrease - No significant bleeding reported  Goal of Therapy:  INR 2-3 Monitor platelets by anticoagulation protocol: Yes   Plan:  1. Coumadin 5mg  po x 1 today if patient is not discharged 2. Follow-up AM INR  Cleon Dew [phone] 12/15/2011,9:52 AM

## 2011-12-15 NOTE — Progress Notes (Signed)
Clinical Social Work  CSW faxed additional PT notes to SNF for insurance approval. Approval is needed before patient can admit to SNF. CSW will continue to follow.  [place], [place] [phone] (Coverage Lovette Cliche)

## 2011-12-15 NOTE — Discharge Summary (Signed)
[name], [name]            ACCOUNT NO.:  [id number]  MEDICAL RECORD NO.:  [id number]  LOCATION:  5041                         FACILITY:  [facility]  PHYSICIAN:  [name], M.D. DATE OF BIRTH:  [birth year]  DATE OF ADMISSION:  12/13/2011 DATE OF DISCHARGE:  12/15/2011                              DISCHARGE SUMMARY   FINAL DIAGNOSES: 1. Status post left total knee replacement for end-stage degenerative     joint disease. 2. Coronary artery disease. 3. Vasovagal syncope. 4. Depression. 5. Dyslipidemia. 6. Hypertension. 7. Gastroesophageal reflux disease.  HISTORY OF PRESENT ILLNESS:  A 61 year old white female with history of end-stage DJD, left knee and chronic pain presented to our office for preop evaluation for total knee replacement.  She had progressively worsening pain with no response with conservative treatment. Significant decrease in her daily activities due to the ongoing complaint.  HOSPITAL COURSE:  On Dec 13, 2011, the patient was taken to the [facility] operating room and a left total knee replacement procedure performed.  SURGEON:  [name], M.D.  ASSISTANT:  Genene Churn. Barry Dienes, PA-C.  ANESTHESIA:  General with femoral nerve block.  ESTIMATED BLOOD LOSS:  Minimal.  No specimens or cultures.  TOURNIQUET TIME:  67 minutes.  There are no surgical or anesthesia complications and the patient was transferred to recovery in stable condition.  After arriving to the orthopedic unit, pharmacy protocol Coumadin and Lovenox started for DVT prophylaxis.  Dec 14, 2011, the patient was doing well but complained of itching from Dilaudid PCA.  Denied chest pain, shortness of breath. Vital signs are stable.  Hemoglobin 10.6, glucose 134, INR 1.18. Dressing clean, dry, intact.  Calf nontender, neurovascularly intact. Skin warm and dry.  Discontinued Dilaudid PCA and started Demerol p.o. Awaiting transfer to [facility] for rehab.  Dec 15, 2011, the  patient doing well with good pain control.  She is ready for transfer to [facility].  No bowel movement yet.  Hemoglobin 9.9, hematocrit 31.1, platelets 175, sodium 139, potassium 3.3, chloride 100, CO2 29, BUN 6, creatinine 0.66, glucose 118.  INR 1.78.  Left knee wound looks good. Staples intact.  No drainage or signs of infection.  Calf nontender, neurovascularly intact.  Hemovac drain removed.  The patient is doing well and ready for transfer today to [facility] for rehab.  For hypokalemia, we will give 1 dose of KCl 40 mEq p.o.  Dulcolax suppository now.  DISPOSITION:  Transfer to [facility] for rehab.  CONDITION:  Good and stable.  DISCHARGE MEDICATIONS:  See after visit summary for medications.  INSTRUCTIONS:  While at [facility], the patient will continue working with PT and OT to improve ambulation and knee range of motion and strengthening.  Weight bear as tolerated with walker and can progress to a cane as tolerated.  Daily dressing changes with 4x4 gauze and apply TED hose over this.  She is okay to shower but no tub soaking.  Do not apply any creams or ointments to her incision.  CPM 0-70 degrees, 6-8 hours daily and increase by 10 degrees each day as tolerated.  Remain on Coumadin at least 4 weeks postop and this to be dosed  by [facility] pharmacist.  Maintain INR 2-3.  Discontinue Lovenox when Coumadin is therapeutic with INR 2-3.  The patient needs return office visit with Dr. Eulah Pont 2 weeks postop for recheck.  Would call our office immediately if there are any questions or concerns before that time.     Genene Churn. Denton Meek.   ______________________________ [name], M.D.    JMO/MEDQ  D:  12/15/2011  T:  12/15/2011  Job:  [id number]

## 2012-08-05 ENCOUNTER — Other Ambulatory Visit: Payer: Self-pay | Admitting: Family Medicine

## 2012-08-05 DIAGNOSIS — Z1231 Encounter for screening mammogram for malignant neoplasm of breast: Secondary | ICD-10-CM

## 2012-08-08 ENCOUNTER — Ambulatory Visit
Admission: RE | Admit: 2012-08-08 | Discharge: 2012-08-08 | Disposition: A | Discharge status: Home / Self Care | Payer: BC Managed Care – PPO | Source: Ambulatory Visit | Attending: Family Medicine | Admitting: Family Medicine

## 2012-08-08 DIAGNOSIS — Z1231 Encounter for screening mammogram for malignant neoplasm of breast: Secondary | ICD-10-CM

## 2012-12-05 ENCOUNTER — Other Ambulatory Visit: Payer: Self-pay | Admitting: Family Medicine

## 2012-12-05 ENCOUNTER — Other Ambulatory Visit (HOSPITAL_COMMUNITY)
Admission: RE | Admit: 2012-12-05 | Discharge: 2012-12-05 | Disposition: A | Discharge status: Home / Self Care | Payer: BC Managed Care – PPO | Source: Ambulatory Visit | Attending: Family Medicine | Admitting: Family Medicine

## 2012-12-05 DIAGNOSIS — Z124 Encounter for screening for malignant neoplasm of cervix: Secondary | ICD-10-CM | POA: Insufficient documentation

## 2012-12-05 DIAGNOSIS — Z1151 Encounter for screening for human papillomavirus (HPV): Secondary | ICD-10-CM | POA: Insufficient documentation

## 2013-01-20 ENCOUNTER — Encounter (HOSPITAL_BASED_OUTPATIENT_CLINIC_OR_DEPARTMENT_OTHER): Payer: Self-pay | Admitting: *Deleted

## 2013-01-20 ENCOUNTER — Other Ambulatory Visit: Payer: Self-pay

## 2013-01-20 ENCOUNTER — Encounter (HOSPITAL_BASED_OUTPATIENT_CLINIC_OR_DEPARTMENT_OTHER)
Admission: RE | Admit: 2013-01-20 | Discharge: 2013-01-20 | Disposition: A | Discharge status: Home / Self Care | Payer: BC Managed Care – PPO | Source: Ambulatory Visit | Attending: Orthopedic Surgery | Admitting: Orthopedic Surgery

## 2013-01-20 LAB — BASIC METABOLIC PANEL
BUN: 13 mg/dL (ref 6–23)
Calcium: 9.6 mg/dL (ref 8.4–10.5)
Creatinine, Ser: 0.72 mg/dL (ref 0.50–1.10)
GFR calc non Af Amer: >90 mL/min (ref >90)
Glucose, Bld: 120 mg/dL — ABNORMAL HIGH (ref 70–99)
Sodium: 139 mEq/L (ref 135–145)

## 2013-01-20 NOTE — Progress Notes (Signed)
To come in for bmet-ekg-to bring cpap dos and knows to wear post op-staying with sister post op To bring meds- Sees dr Mayford Knife for htn-called for notes

## 2013-01-20 NOTE — H&P (Signed)
Kamela Blansett/[facility] [address]   [address] [place], [place] [address] [phone] A Division of [facility]  Loreta Ave, M.D.   Robert A. Thurston Hole, M.D.   Burnell Blanks, M.D.   Eulas Post, M.D.   Lunette Stands, M.D Buford Dresser, M.D.  Charlsie Quest, M.D.   Estell Harpin, M.D.   Melina Fiddler, M.D. Genene Churn. Barry Dienes, PA-C            Kirstin A. Shepperson, PA-C Josh [place], PA-C [place], [place]   RE: [place], [place]                                [phone]      DOB: [birth year] PROGRESS NOTE: 11-26-12 [age] year-old white female who is one year status post left total knee replacement.  Returns.  States that her knee is doing well and she continues to make progress.  For the last several months she feels like her rehab has been somewhat slower due to ongoing problems with left posterior heel pain.  Previous diagnosis of left Achilles tendonitis.  X-rays in December of 2013 showed Haglund deformity with some calcific changes at the distal Achilles calcaneal insertion.  She has been doing a lot of stretching exercises, but this has not been helping.  She also intermittently gets pain in the plantar aspect of her heel.  Heel pain aggravated with ambulation and stairs.    EXAMINATION: Pleasant white female, alert and oriented x 3 and in no acute distress.  No increase in respiratory effort.  Left knee range of motion about 0-115+ degrees.  Non-tender.  Calf non-tender.  Neurovascularly intact.  Skin warm and dry.  Left ankle she has good range of motion.  Minimal swelling medial aspect of her heel.  She is exquisitely tender over the distal Achilles tendon.  No tendon defect.  Mildly tender at the plantar fascial calcaneal insertion.    X-RAYS: Left knee, AP, lateral and sunrise views, show good alignment and seating of her prosthesis.    DISPOSITION:  For her ongoing problems with her heel we will schedule  an MRI scan of her ankle to rule out Achilles tendon tear and plantar fasciitis.  She will follow up in the office after completion to discuss results.  We will continue home exercise program for her left knee.  We will repeat x-ray of her knee in one year.    Loreta Ave, M.D.   Electronically verified by Loreta Ave, M.D. DFM(JMO):jjh D 11-27-12 T 11-28-12  Juanjesus Pepperman/[facility] [address]   [address] [place], [place] [address] [phone] A Division of [facility]  Loreta Ave, M.D.   Robert A. Thurston Hole, M.D.   Burnell Blanks, M.D.   Eulas Post, M.D.   Lunette Stands, M.D Buford Dresser, M.D.  Charlsie Quest, M.D.   Estell Harpin, M.D.   Melina Fiddler, M.D. Genene Churn. Barry Dienes, PA-C            Kirstin A. Shepperson, PA-C Josh [place], PA-C [place], [place]   RE: [place], [place]                                [phone]      DOB: 11-27-1950 PROGRESS NOTE: 12-06-12 Preslee comes  in for follow up.  Continued unrelenting symptoms from her pump bump Achilles attachment tendonitis, left heel.  This has progressed to a point that she is limping all the time.  She cannot exercise.  As a result she is getting stiff in both knees where she has had knee replacements.  She is also getting some iliotibial band tightness and trochanteric bursitis, left hip.  I went over her MRI report and scan and discussed it with her.  This shows a picture of insertional tendinopathy and swelling on both sides of the tendon and pump bump.    DISPOSITION:  More than 25 minutes spent covering workup and treatment to date.  I went over her x-rays, her MRI report and her MRI scan.  She does have some mild plantar fascia irritation on her scan, but she really does not have clinical symptoms there.  We discussed definitive treatment.  General anesthesia.  Prone position.  Debridement of os calcis, pump bump, attachment spurs, debridement  and speed row double bridge repair of the Achilles back to the os calcis.  Procedure, risks, benefits and complications reviewed in detail.  She understands.  Paperwork complete.  All questions answered.  I will see her at the time of operative intervention.  I don't think we have any other treatment options.  She understands and agrees.    Loreta Ave, M.D.

## 2013-01-20 NOTE — Progress Notes (Signed)
EKG reviewed by Dr Ivin Booty. Please obtain notes from Dr Armanda Magic. Karin Golden has called and is calling for notes again at this time.

## 2013-01-22 ENCOUNTER — Other Ambulatory Visit: Payer: Self-pay | Admitting: Orthopedic Surgery

## 2013-01-23 ENCOUNTER — Ambulatory Visit (HOSPITAL_BASED_OUTPATIENT_CLINIC_OR_DEPARTMENT_OTHER): Payer: BC Managed Care – PPO | Admitting: Anesthesiology

## 2013-01-23 ENCOUNTER — Encounter (HOSPITAL_BASED_OUTPATIENT_CLINIC_OR_DEPARTMENT_OTHER): Payer: Self-pay | Admitting: Anesthesiology

## 2013-01-23 ENCOUNTER — Encounter (HOSPITAL_BASED_OUTPATIENT_CLINIC_OR_DEPARTMENT_OTHER)
Admission: RE | Disposition: A | Discharge status: Home / Self Care | Payer: Self-pay | Source: Ambulatory Visit | Attending: Orthopedic Surgery

## 2013-01-23 ENCOUNTER — Ambulatory Visit (HOSPITAL_BASED_OUTPATIENT_CLINIC_OR_DEPARTMENT_OTHER)
Admission: RE | Admit: 2013-01-23 | Discharge: 2013-01-23 | Disposition: A | Discharge status: Home / Self Care | Payer: BC Managed Care – PPO | Source: Ambulatory Visit | Attending: Orthopedic Surgery | Admitting: Orthopedic Surgery

## 2013-01-23 DIAGNOSIS — Z96659 Presence of unspecified artificial knee joint: Secondary | ICD-10-CM | POA: Insufficient documentation

## 2013-01-23 DIAGNOSIS — K449 Diaphragmatic hernia without obstruction or gangrene: Secondary | ICD-10-CM | POA: Insufficient documentation

## 2013-01-23 DIAGNOSIS — G4730 Sleep apnea, unspecified: Secondary | ICD-10-CM | POA: Insufficient documentation

## 2013-01-23 DIAGNOSIS — K219 Gastro-esophageal reflux disease without esophagitis: Secondary | ICD-10-CM | POA: Insufficient documentation

## 2013-01-23 DIAGNOSIS — M898X9 Other specified disorders of bone, unspecified site: Secondary | ICD-10-CM | POA: Insufficient documentation

## 2013-01-23 DIAGNOSIS — N289 Disorder of kidney and ureter, unspecified: Secondary | ICD-10-CM | POA: Insufficient documentation

## 2013-01-23 DIAGNOSIS — M7662 Achilles tendinitis, left leg: Secondary | ICD-10-CM

## 2013-01-23 DIAGNOSIS — M66369 Spontaneous rupture of flexor tendons, unspecified lower leg: Secondary | ICD-10-CM | POA: Insufficient documentation

## 2013-01-23 DIAGNOSIS — I10 Essential (primary) hypertension: Secondary | ICD-10-CM | POA: Insufficient documentation

## 2013-01-23 HISTORY — PX: ACHILLES TENDON SURGERY: SHX542

## 2013-01-23 SURGERY — REPAIR, TENDON, ACHILLES
Anesthesia: General | Site: Foot | Laterality: Left | Wound class: Clean

## 2013-01-23 MED ORDER — LACTATED RINGERS IV SOLN
INTRAVENOUS | Status: DC
  Administered 2013-01-23: 08:00:00 via INTRAVENOUS

## 2013-01-23 MED ORDER — METOCLOPRAMIDE HCL 5 MG/ML IJ SOLN
INTRAMUSCULAR | Status: DC | PRN
  Administered 2013-01-23: 10 mg via INTRAVENOUS

## 2013-01-23 MED ORDER — ONDANSETRON HCL 4 MG PO TABS: 4.0000 mg | ORAL_TABLET | Freq: Four times a day (QID) | ORAL | Status: DC | PRN

## 2013-01-23 MED ORDER — HYDROMORPHONE HCL 2 MG PO TABS: 2.0000 mg | ORAL_TABLET | ORAL | Status: DC | PRN

## 2013-01-23 MED ORDER — CHLORHEXIDINE GLUCONATE 4 % EX LIQD: 60.0000 mL | Freq: Once | CUTANEOUS | Status: DC

## 2013-01-23 MED ORDER — METOCLOPRAMIDE HCL 5 MG PO TABS: 5.0000 mg | ORAL_TABLET | Freq: Three times a day (TID) | ORAL | Status: DC | PRN

## 2013-01-23 MED ORDER — PROPOFOL 10 MG/ML IV BOLUS
INTRAVENOUS | Status: DC | PRN
  Administered 2013-01-23: 250 mg via INTRAVENOUS

## 2013-01-23 MED ORDER — ONDANSETRON HCL 4 MG/2ML IJ SOLN
INTRAMUSCULAR | Status: DC | PRN
  Administered 2013-01-23: 4 mg via INTRAVENOUS

## 2013-01-23 MED ORDER — ONDANSETRON HCL 4 MG/2ML IJ SOLN: 4.0000 mg | Freq: Four times a day (QID) | INTRAMUSCULAR | Status: DC | PRN

## 2013-01-23 MED ORDER — METOCLOPRAMIDE HCL 5 MG/ML IJ SOLN: 10.0000 mg | Freq: Once | INTRAMUSCULAR | Status: DC | PRN

## 2013-01-23 MED ORDER — LIDOCAINE-EPINEPHRINE (PF) 1.5 %-1:200000 IJ SOLN
INTRAMUSCULAR | Status: DC | PRN
  Administered 2013-01-23: 20 mL

## 2013-01-23 MED ORDER — OXYCODONE HCL 5 MG/5ML PO SOLN: 5.0000 mg | Freq: Once | ORAL | Status: DC | PRN

## 2013-01-23 MED ORDER — ROPIVACAINE HCL 5 MG/ML IJ SOLN
INTRAMUSCULAR | Status: DC | PRN
  Administered 2013-01-23: 20 mL

## 2013-01-23 MED ORDER — LIDOCAINE HCL (CARDIAC) 20 MG/ML IV SOLN
INTRAVENOUS | Status: DC | PRN
  Administered 2013-01-23: 80 mg via INTRAVENOUS

## 2013-01-23 MED ORDER — SUCCINYLCHOLINE CHLORIDE 20 MG/ML IJ SOLN
INTRAMUSCULAR | Status: DC | PRN
  Administered 2013-01-23: 100 mg via INTRAVENOUS

## 2013-01-23 MED ORDER — OXYCODONE HCL 5 MG PO TABS: 5.0000 mg | ORAL_TABLET | Freq: Once | ORAL | Status: DC | PRN

## 2013-01-23 MED ORDER — HYDROMORPHONE HCL PF 1 MG/ML IJ SOLN: 0.2500 mg | INTRAMUSCULAR | Status: DC | PRN

## 2013-01-23 MED ORDER — FENTANYL CITRATE 0.05 MG/ML IJ SOLN
50.0000 ug | INTRAMUSCULAR | Status: DC | PRN
  Administered 2013-01-23: 100 ug via INTRAVENOUS

## 2013-01-23 MED ORDER — LIDOCAINE HCL 1 % IJ SOLN
INTRAMUSCULAR | Status: DC | PRN
  Administered 2013-01-23: 2 mL via INTRADERMAL

## 2013-01-23 MED ORDER — MIDAZOLAM HCL 2 MG/2ML IJ SOLN
1.0000 mg | INTRAMUSCULAR | Status: DC | PRN
  Administered 2013-01-23: 2 mg via INTRAVENOUS

## 2013-01-23 MED ORDER — SODIUM CHLORIDE 0.9 % IR SOLN
Status: DC | PRN
  Administered 2013-01-23: 1

## 2013-01-23 MED ORDER — VANCOMYCIN HCL IN DEXTROSE 1-5 GM/200ML-% IV SOLN
1000.0000 mg | INTRAVENOUS | Status: AC
  Administered 2013-01-23: 1000 mg via INTRAVENOUS

## 2013-01-23 MED ORDER — LACTATED RINGERS IV SOLN
INTRAVENOUS | Status: DC
  Administered 2013-01-23: 07:00:00 via INTRAVENOUS

## 2013-01-23 MED ORDER — METOCLOPRAMIDE HCL 5 MG/ML IJ SOLN: 5.0000 mg | Freq: Three times a day (TID) | INTRAMUSCULAR | Status: DC | PRN

## 2013-01-23 SURGICAL SUPPLY — 71 items
BANDAGE ELASTIC 4 VELCRO ST LF (GAUZE/BANDAGES/DRESSINGS) ×2 IMPLANT
BANDAGE ELASTIC 6 VELCRO ST LF (GAUZE/BANDAGES/DRESSINGS) ×2 IMPLANT
BANDAGE ESMARK 6X9 LF (GAUZE/BANDAGES/DRESSINGS) ×1 IMPLANT
BLADE AVERAGE 25X9 (BLADE) ×2 IMPLANT
BLADE SURG 15 STRL LF DISP TIS (BLADE) ×2 IMPLANT
BLADE SURG 15 STRL SS (BLADE) ×2
BNDG COHESIVE 4X5 TAN STRL (GAUZE/BANDAGES/DRESSINGS) ×2 IMPLANT
BNDG ESMARK 6X9 LF (GAUZE/BANDAGES/DRESSINGS) ×2
CANISTER SUCTION 1200CC (MISCELLANEOUS) ×2 IMPLANT
CLOTH BEACON ORANGE TIMEOUT ST (SAFETY) ×2 IMPLANT
COVER TABLE BACK 60X90 (DRAPES) ×2 IMPLANT
CUFF TOURNIQUET SINGLE 34IN LL (TOURNIQUET CUFF) ×2 IMPLANT
DRAPE EXTREMITY T 121X128X90 (DRAPE) ×2 IMPLANT
DRAPE OEC MINIVIEW 54X84 (DRAPES) IMPLANT
DRAPE U 20/CS (DRAPES) ×2 IMPLANT
DRAPE U-SHAPE 47X51 STRL (DRAPES) ×2 IMPLANT
DRSG PAD ABDOMINAL 8X10 ST (GAUZE/BANDAGES/DRESSINGS) ×2 IMPLANT
DURAPREP 26ML APPLICATOR (WOUND CARE) ×2 IMPLANT
ELECT REM PT RETURN 9FT ADLT (ELECTROSURGICAL) ×2
ELECTRODE REM PT RTRN 9FT ADLT (ELECTROSURGICAL) ×1 IMPLANT
GAUZE XEROFORM 1X8 LF (GAUZE/BANDAGES/DRESSINGS) ×2 IMPLANT
GLOVE BIOGEL M STRL SZ7.5 (GLOVE) ×2 IMPLANT
GLOVE BIOGEL PI IND STRL 8 (GLOVE) ×1 IMPLANT
GLOVE BIOGEL PI INDICATOR 8 (GLOVE) ×1
GLOVE ECLIPSE 6.5 STRL STRAW (GLOVE) ×2 IMPLANT
GLOVE EXAM NITRILE LRG STRL (GLOVE) ×2 IMPLANT
GLOVE ORTHO TXT STRL SZ7.5 (GLOVE) ×4 IMPLANT
GLOVE SURG SS PI 7.0 STRL IVOR (GLOVE) ×2 IMPLANT
GOWN BRE IMP PREV XXLGXLNG (GOWN DISPOSABLE) ×2 IMPLANT
GOWN PREVENTION PLUS XLARGE (GOWN DISPOSABLE) ×2 IMPLANT
GOWN PREVENTION PLUS XXLARGE (GOWN DISPOSABLE) ×4 IMPLANT
IMPL SYS BIOCOMP ACH SPEED (Anchor) ×1 IMPLANT
IMPLANT SYS BIOCOMP ACH SPEED (Anchor) ×2 IMPLANT
NEEDLE 1/2 CIR CATGUT .05X1.09 (NEEDLE) IMPLANT
NEEDLE HYPO 22GX1.5 SAFETY (NEEDLE) IMPLANT
PACK ACHILLES SUTUREBRIDGE (Anchor) ×2 IMPLANT
PACK BASIN DAY SURGERY FS (CUSTOM PROCEDURE TRAY) ×2 IMPLANT
PAD CAST 4YDX4 CTTN HI CHSV (CAST SUPPLIES) ×2 IMPLANT
PADDING CAST ABS 4INX4YD NS (CAST SUPPLIES) ×2
PADDING CAST ABS COTTON 4X4 ST (CAST SUPPLIES) ×2 IMPLANT
PADDING CAST COTTON 4X4 STRL (CAST SUPPLIES) ×2
PADDING CAST COTTON 6X4 STRL (CAST SUPPLIES) ×2 IMPLANT
PENCIL BUTTON HOLSTER BLD 10FT (ELECTRODE) ×2 IMPLANT
SLEEVE SCD COMPRESS KNEE MED (MISCELLANEOUS) IMPLANT
SPLINT FAST PLASTER 5X30 (CAST SUPPLIES) ×15
SPLINT FIBERGLASS 4X30 (CAST SUPPLIES) IMPLANT
SPLINT PLASTER CAST FAST 5X30 (CAST SUPPLIES) ×15 IMPLANT
SPONGE GAUZE 4X4 12PLY (GAUZE/BANDAGES/DRESSINGS) ×2 IMPLANT
STAPLER VISISTAT 35W (STAPLE) IMPLANT
STOCKINETTE 6  STRL (DRAPES) ×1
STOCKINETTE 6 STRL (DRAPES) ×1 IMPLANT
SUCTION FRAZIER TIP 10 FR DISP (SUCTIONS) ×2 IMPLANT
SUT ETHILON 3 0 PS 1 (SUTURE) ×2 IMPLANT
SUT FIBERWIRE #2 38 T-5 BLUE (SUTURE)
SUT VIC AB 0 CT1 27 (SUTURE)
SUT VIC AB 0 CT1 27XBRD ANBCTR (SUTURE) IMPLANT
SUT VIC AB 1 CT1 27 (SUTURE) ×1
SUT VIC AB 1 CT1 27XBRD ANBCTR (SUTURE) ×1 IMPLANT
SUT VIC AB 2-0 SH 27 (SUTURE) ×1
SUT VIC AB 2-0 SH 27XBRD (SUTURE) ×1 IMPLANT
SUT VIC AB 3-0 SH 27 (SUTURE)
SUT VIC AB 3-0 SH 27X BRD (SUTURE) IMPLANT
SUTURE BRIDGE ×2 IMPLANT
SUTURE FIBERWR #2 38 T-5 BLUE (SUTURE) IMPLANT
SYR BULB 3OZ (MISCELLANEOUS) ×2 IMPLANT
SYR CONTROL 10ML LL (SYRINGE) IMPLANT
TOWEL OR 17X24 6PK STRL BLUE (TOWEL DISPOSABLE) ×2 IMPLANT
TOWEL OR NON WOVEN STRL DISP B (DISPOSABLE) ×2 IMPLANT
TUBE CONNECTING 20X1/4 (TUBING) ×2 IMPLANT
UNDERPAD 30X30 INCONTINENT (UNDERPADS AND DIAPERS) ×2 IMPLANT
YANKAUER SUCT BULB TIP NO VENT (SUCTIONS) IMPLANT

## 2013-01-23 NOTE — Interval H&P Note (Signed)
History and Physical Interval Note:  01/23/2013 7:30 AM  [name]  has presented today for surgery, with the diagnosis of LEFT EXOSTOSIS TENDON RUPTURE ACHILLES TENDON  The various methods of treatment have been discussed with the patient and family. After consideration of risks, benefits and other options for treatment, the patient has consented to  Procedure(s): EXCISION PARTIAL BONE TALUS/CALCANEUS, REPAIR RUPTURE ACHILLES TENDON PRIMARY OPEN/PERCUTANEOUS  (Left) as a surgical intervention .  The patient's history has been reviewed, patient examined, no change in status, stable for surgery.  I have reviewed the patient's chart and labs.  Questions were answered to the patient's satisfaction.     Harim Bi F

## 2013-01-23 NOTE — Anesthesia Preprocedure Evaluation (Signed)
Anesthesia Evaluation  Patient identified by MRN, date of birth, ID band Patient awake    Reviewed: Allergy & Precautions, H&P , NPO status , Patient's Chart, lab work & pertinent test results, reviewed documented beta blocker date and time   Airway Mallampati: II TM Distance: >3 FB Neck ROM: full    Dental   Pulmonary sleep apnea ,  breath sounds clear to auscultation        Cardiovascular hypertension, On Medications Rhythm:regular     Neuro/Psych PSYCHIATRIC DISORDERS negative neurological ROS     GI/Hepatic Neg liver ROS, hiatal hernia, GERD-  Medicated and Controlled,  Endo/Other  negative endocrine ROS  Renal/GU Renal disease  negative genitourinary   Musculoskeletal   Abdominal   Peds  Hematology negative hematology ROS (+)   Anesthesia Other Findings See surgeon's H&P   Reproductive/Obstetrics negative OB ROS                           Anesthesia Physical Anesthesia Plan  ASA: III  Anesthesia Plan: General   Post-op Pain Management:    Induction: Intravenous  Airway Management Planned: Oral ETT  Additional Equipment:   Intra-op Plan:   Post-operative Plan: Extubation in OR  Informed Consent: I have reviewed the patients History and Physical, chart, labs and discussed the procedure including the risks, benefits and alternatives for the proposed anesthesia with the patient or authorized representative who has indicated his/her understanding and acceptance.   Dental Advisory Given  Plan Discussed with: CRNA and Surgeon  Anesthesia Plan Comments:         Anesthesia Quick Evaluation

## 2013-01-23 NOTE — Anesthesia Procedure Notes (Addendum)
Anesthesia Regional Block:  Popliteal block  Pre-Anesthetic Checklist: ,, timeout performed, Correct Patient, Correct Site, Correct Laterality, Correct Procedure, Correct Position, site marked, Risks and benefits discussed,  Surgical consent,  Pre-op evaluation,  At surgeon's request and post-op pain management  Laterality: Left  Prep: chloraprep       Needles:   Needle Type: Other     Needle Length: 9cm  Needle Gauge: 21    Additional Needles:  Procedures: ultrasound guided (picture in chart) Popliteal block Narrative:  Start time: 01/23/2013 7:55 AM End time: 01/23/2013 8:03 AM Injection made incrementally with aspirations every 5 mL.  Performed by: Personally  Anesthesiologist: Aldona Lento, MD  Additional Notes: Ultrasound guidance used to: id relevant anatomy, confirm needle position, local anesthetic spread, avoidance of vascular puncture. Picture saved. No complications. Block performed personally by Janetta Hora. Gelene Mink, MD  .    Popliteal block Procedure Name: Intubation Performed by: York Grice Pre-anesthesia Checklist: Patient identified, Timeout performed, Emergency Drugs available, Suction available and Patient being monitored Patient Re-evaluated:Patient Re-evaluated prior to inductionOxygen Delivery Method: Circle system utilized Preoxygenation: Pre-oxygenation with 100% oxygen Intubation Type: IV induction Ventilation: Mask ventilation without difficulty Grade View: Grade I Tube type: Oral Tube size: 7.0 mm Number of attempts: 1 Airway Equipment and Method: Video-laryngoscopy Placement Confirmation: ETT inserted through vocal cords under direct vision,  breath sounds checked- equal and bilateral and positive ETCO2 Secured at: 22 cm Tube secured with: Tape Dental Injury: Teeth and Oropharynx as per pre-operative assessment

## 2013-01-23 NOTE — Transfer of Care (Signed)
Immediate Anesthesia Transfer of Care Note  Patient: [name]  Procedure(s) Performed: Procedure(s): EXCISION PARTIAL BONE TALUS/CALCANEUS, REPAIR RUPTURE ACHILLES TENDON PRIMARY OPEN  (Left)  Patient Location: PACU  Anesthesia Type:General  Level of Consciousness: awake and sedated  Airway & Oxygen Therapy: Patient Spontanous Breathing and Patient connected to face mask oxygen  Post-op Assessment: Report given to PACU RN and Post -op Vital signs reviewed and stable  Post vital signs: Reviewed and stable  Complications: No apparent anesthesia complications

## 2013-01-23 NOTE — Progress Notes (Signed)
Assisted Dr. Frederick with left, ultrasound guided, popliteal/saphenous block. Side rails up, monitors on throughout procedure. See vital signs in flow sheet. Tolerated Procedure well. 

## 2013-01-23 NOTE — Anesthesia Postprocedure Evaluation (Signed)
Anesthesia Post Note  Patient: [name]  Procedure(s) Performed: Procedure(s) (LRB): EXCISION PARTIAL BONE TALUS/CALCANEUS, REPAIR RUPTURE ACHILLES TENDON PRIMARY OPEN  (Left)  Anesthesia type: General  Patient location: PACU  Post pain: Pain level controlled  Post assessment: Patient's Cardiovascular Status Stable  Last Vitals:  Filed Vitals:   01/23/13 1030  BP: 111/63  Pulse: 78  Temp:   Resp: 11    Post vital signs: Reviewed and stable  Level of consciousness: alert  Complications: No apparent anesthesia complications

## 2013-01-24 ENCOUNTER — Encounter (HOSPITAL_BASED_OUTPATIENT_CLINIC_OR_DEPARTMENT_OTHER): Payer: Self-pay | Admitting: Orthopedic Surgery

## 2013-01-24 NOTE — Op Note (Signed)
NAME:  [name], [name]          ACCOUNT NO.:  [id number]  MEDICAL RECORD NO.:  [id number]  LOCATION:  CYT                          FACILITY:  [facility]  PHYSICIAN:  Kirstine Jacquin F. Avree Szczygiel, M.D. DATE OF BIRTH:  01/04/1951  DATE OF PROCEDURE:  01/23/2013 DATE OF DISCHARGE:  01/23/2013                              OPERATIVE REPORT   PREOPERATIVE DIAGNOSES:  Chronic Achilles tendinosis, tendinopathy, tearing left heel. Haglund's deformity with pre-Achilles bursitis.  POSTOPERATIVE DIAGNOSES:  Chronic Achilles tendinosis, tendinopathy, tearing left heel. Haglund's deformity with pre-Achilles bursitis.  PROCEDURES:  Exploration of left heel.  Excision of Haglund's deformity, pre-Achilles bursitis.  Debridement of Achilles tendon with repair utilizing an Arthrex speed bridge technique.  SURGEON:  Saharah Sherrow F. Rohnan Bartleson, M.D.  ASSISTANT:  Jane B. Roberts, P.Hale.  ANESTHESIA:  General.  BLOOD LOSS:  Minimal.  SPECIMENS:  None.  COUNTS:  None.  CULTURES:  None.  COMPLICATIONS:  None.  DRESSING:  Soft compressive, short-leg splint.  TOURNIQUET TIME:  45 minutes.  PROCEDURE IN DETAIL:  The patient was brought to the operating room, placed on the operating table in supine position.  After adequate anesthesia had been obtained, tourniquet applied to upper aspect of the leg, turned to prone position.  Appropriate padding and support. Prepped and draped in usual sterile fashion.  Exsanguinated with elevation of Esmarch.  Tourniquet inflated to 350 mmHg.  Longitudinal incision to adjacent lateral border of the Achilles tendon down to the lateral wall of the os calcis.  Skin and subcutaneous tissue divided. Marked tendinopathy tearing throughout the distal centimeter of the Achilles tendon.  Partially detached array.  This was completely detached, retracted, debrided back to healthy tissue throughout.  Pre- Achilles bursa excised.  Haglund's deformity removed with fluoroscopic guidance.  Notch  contouring.  Attachment site on the os calcis, reinforced with fluoroscopic guidance.  Two sets of drill holes were made through the Arthrex speed bridge technique.  An anchor was then placed at the 2 proximal holes.  Sutures brought through the tendon. The foot was plantar flexed.  Sutures crossed over and then anchored down distal to the attachment in the 2 distal holes with 2 swivel lock anchors there.  At completion, nice firm reattachment watertight closure.  Adequate contouring removal of the deformity confirmed visually and fluoroscopically.  Wound irrigated.  Closed with Vicryl and nylon.  Sterile compressive dressing applied.  Short-leg splint applied. Tourniquet deflated and removed.  Returned to supine position. Anesthesia reversed.  Brought to recovery room.  Tolerated surgery well with no complications.     Aydeen Blume F. Sharaya Boruff, M.D.     DFM/MEDQ  D:  01/23/2013  T:  01/24/2013  Job:  [id number]  cc:   Jane B. Roberts, P.Hale. 

## 2013-01-24 NOTE — Op Note (Deleted)
NAMESAMREEN, SELTZER          ACCOUNT NO.:  [id number]  MEDICAL RECORD NO.:  [id number]  LOCATION:  CYT                          FACILITY:  [facility]  PHYSICIAN:  Loreta Ave, M.D. DATE OF BIRTH:  03-02-1951  DATE OF PROCEDURE:  01/23/2013 DATE OF DISCHARGE:  01/23/2013                              OPERATIVE REPORT   PREOPERATIVE DIAGNOSES:  Chronic Achilles tendinosis, tendinopathy, tearing left heel. Haglund's deformity with pre-Achilles bursitis.  POSTOPERATIVE DIAGNOSES:  Chronic Achilles tendinosis, tendinopathy, tearing left heel. Haglund's deformity with pre-Achilles bursitis.  PROCEDURES:  Exploration of left heel.  Excision of Haglund's deformity, pre-Achilles bursitis.  Debridement of Achilles tendon with repair utilizing an Arthrex speed bridge technique.  SURGEON:  Loreta Ave, M.D.  ASSISTANT:  Laural Benes. Jannet Mantis.  ANESTHESIA:  General.  BLOOD LOSS:  Minimal.  SPECIMENS:  None.  COUNTS:  None.  CULTURES:  None.  COMPLICATIONS:  None.  DRESSING:  Soft compressive, short-leg splint.  TOURNIQUET TIME:  45 minutes.  PROCEDURE IN DETAIL:  The patient was brought to the operating room, placed on the operating table in supine position.  After adequate anesthesia had been obtained, tourniquet applied to upper aspect of the leg, turned to prone position.  Appropriate padding and support. Prepped and draped in usual sterile fashion.  Exsanguinated with elevation of Esmarch.  Tourniquet inflated to 350 mmHg.  Longitudinal incision to adjacent lateral border of the Achilles tendon down to the lateral wall of the os calcis.  Skin and subcutaneous tissue divided. Marked tendinopathy tearing throughout the distal centimeter of the Achilles tendon.  Partially detached array.  This was completely detached, retracted, debrided back to healthy tissue throughout.  Pre- Achilles bursa excised.  Haglund's deformity removed with fluoroscopic guidance.  Notch  contouring.  Attachment site on the os calcis, reinforced with fluoroscopic guidance.  Two sets of drill holes were made through the Arthrex speed bridge technique.  An anchor was then placed at the 2 proximal holes.  Sutures brought through the tendon. The foot was plantar flexed.  Sutures crossed over and then anchored down distal to the attachment in the 2 distal holes with 2 swivel lock anchors there.  At completion, nice firm reattachment watertight closure.  Adequate contouring removal of the deformity confirmed visually and fluoroscopically.  Wound irrigated.  Closed with Vicryl and nylon.  Sterile compressive dressing applied.  Short-leg splint applied. Tourniquet deflated and removed.  Returned to supine position. Anesthesia reversed.  Brought to recovery room.  Tolerated surgery well with no complications.     Loreta Ave, M.D.     DFM/MEDQ  D:  01/23/2013  T:  01/24/2013  Job:  [id number]  cc:   Laural Benes. Su Hilt, P.A.

## 2013-05-19 ENCOUNTER — Other Ambulatory Visit: Payer: Self-pay | Admitting: *Deleted

## 2013-05-19 DIAGNOSIS — E785 Hyperlipidemia, unspecified: Secondary | ICD-10-CM

## 2013-05-19 DIAGNOSIS — Z79899 Other long term (current) drug therapy: Secondary | ICD-10-CM

## 2013-05-21 ENCOUNTER — Other Ambulatory Visit: Payer: Self-pay | Admitting: Cardiology

## 2013-06-05 ENCOUNTER — Encounter: Payer: Self-pay | Admitting: Cardiology

## 2013-06-10 ENCOUNTER — Telehealth: Payer: Self-pay | Admitting: *Deleted

## 2013-06-11 MED ORDER — EZETIMIBE 10 MG PO TABS: 10.0000 mg | ORAL_TABLET | Freq: Every day | ORAL | Status: DC

## 2013-06-11 NOTE — Telephone Encounter (Signed)
Medication sent in for pt  

## 2013-06-30 ENCOUNTER — Other Ambulatory Visit: Payer: BC Managed Care – PPO

## 2013-09-01 ENCOUNTER — Encounter: Payer: Self-pay | Admitting: General Surgery

## 2013-09-01 DIAGNOSIS — I2510 Atherosclerotic heart disease of native coronary artery without angina pectoris: Secondary | ICD-10-CM | POA: Insufficient documentation

## 2013-09-01 DIAGNOSIS — G4733 Obstructive sleep apnea (adult) (pediatric): Secondary | ICD-10-CM

## 2013-09-01 DIAGNOSIS — I119 Hypertensive heart disease without heart failure: Secondary | ICD-10-CM

## 2013-09-01 DIAGNOSIS — I10 Essential (primary) hypertension: Secondary | ICD-10-CM

## 2013-09-01 DIAGNOSIS — I5189 Other ill-defined heart diseases: Secondary | ICD-10-CM | POA: Insufficient documentation

## 2013-09-01 DIAGNOSIS — E7800 Pure hypercholesterolemia, unspecified: Secondary | ICD-10-CM | POA: Insufficient documentation

## 2013-09-04 ENCOUNTER — Ambulatory Visit: Payer: BC Managed Care – PPO | Admitting: Cardiology

## 2013-09-04 ENCOUNTER — Other Ambulatory Visit: Payer: BC Managed Care – PPO

## 2013-09-09 ENCOUNTER — Ambulatory Visit: Payer: BC Managed Care – PPO | Admitting: Cardiology

## 2013-09-17 ENCOUNTER — Other Ambulatory Visit (INDEPENDENT_AMBULATORY_CARE_PROVIDER_SITE_OTHER): Payer: BC Managed Care – PPO

## 2013-09-17 DIAGNOSIS — Z79899 Other long term (current) drug therapy: Secondary | ICD-10-CM

## 2013-09-17 DIAGNOSIS — E785 Hyperlipidemia, unspecified: Secondary | ICD-10-CM

## 2013-09-17 LAB — LIPID PANEL
Cholesterol: 165 mg/dL (ref 0–200)
HDL: 49 mg/dL (ref >39.00)
TRIGLYCERIDES: 203 mg/dL — AB (ref 0.0–149.0)
Total CHOL/HDL Ratio: 3
VLDL: 40.6 mg/dL — AB (ref 0.0–40.0)

## 2013-09-17 LAB — ALT: ALT: 34 U/L (ref 0–35)

## 2013-09-17 LAB — LDL CHOLESTEROL, DIRECT: Direct LDL: 93.3 mg/dL

## 2013-09-22 ENCOUNTER — Ambulatory Visit (INDEPENDENT_AMBULATORY_CARE_PROVIDER_SITE_OTHER): Payer: BC Managed Care – PPO | Admitting: Cardiology

## 2013-09-22 ENCOUNTER — Encounter: Payer: Self-pay | Admitting: Cardiology

## 2013-09-22 VITALS — BP 130/75 | HR 81 | Ht 67.0 in | Wt 227.0 lb

## 2013-09-22 DIAGNOSIS — G4733 Obstructive sleep apnea (adult) (pediatric): Secondary | ICD-10-CM

## 2013-09-22 DIAGNOSIS — E7800 Pure hypercholesterolemia, unspecified: Secondary | ICD-10-CM

## 2013-09-22 DIAGNOSIS — I10 Essential (primary) hypertension: Secondary | ICD-10-CM

## 2013-09-22 DIAGNOSIS — I2510 Atherosclerotic heart disease of native coronary artery without angina pectoris: Secondary | ICD-10-CM

## 2013-09-22 NOTE — Patient Instructions (Addendum)
Your physician recommends that you continue on your current medications as directed. Please refer to the Current Medication list given to you today.  You have been referred to the [facility] Please set an appointment up at Check out today    Please remember to bring your card by for me to download in office. It will only take a few minutes, and I will be able to return your card to you before you leave.   Your physician wants you to follow-up in: 6 Months with Dr Mallie Snooks will receive a reminder letter in the mail two months in advance. If you don't receive a letter, please call our office to schedule the follow-up appointment.

## 2013-09-22 NOTE — Progress Notes (Signed)
[place], [address], [place]  [address] Phone: [phone] Fax:  [phone]  Date:  09/22/2013   ID:  [name], DOB [birth year], MRN [id number]  PCP:  Tawanna Solo, MD  Cardiologist:  Fransico Him, MD     History of Present Illness: [name] is a 63 y.o. female with a history of nonobstructive ASCAD with 20% OM, HTN, dyslipidemia, and OSA on CPAP who presents today for followup.  She is doing well.  She denies any chest pain, SOB, DOE, LE edema, dizziness, palpitations or syncope.  She occasionally gets some gas pains in her upper abdomen and chest that resolve with belching.  She tolerates her CPAP well.  She tolerates the mask and feels the pressure is adequate.  She feels rested in the am but has noticed increased daytime sleepiness after dinner.   Wt Readings from Last 3 Encounters:  09/22/13 227 lb (102.967 kg)  01/23/13 227 lb (102.967 kg)  01/23/13 227 lb (102.967 kg)     Past Medical History  Diagnosis Date  . Hypertension   . GERD (gastroesophageal reflux disease)   . Arthritis   . Depression   . High cholesterol   . H/O hiatal hernia   . Kidney stones   . Sleep apnea     CPAP had Sleep Study ordered by Dr. Radford Pax  . Vasovagal syncope   . Obesity   . Diastolic dysfunction     w Elevated LVEDP  . Right knee pain 12/2009    Dr Marlou Sa  . Chronic fatigue     and weakness  . Iron deficiency anemia   . Multiple thyroid nodules   . Hypoglycemia   . Neurocardiogenic syncope   . DJD (degenerative joint disease)     C3/4 Dr Sherwood Gambler  . Prediabetes 2011 & 2012  . Coronary artery disease     nonobstructive with 20% OM by cath 2006    Current Outpatient Prescriptions  Medication Sig Dispense Refill  . ALPRAZOLAM ER PO Take 10 mg by mouth.      Marland Kitchen aspirin 81 MG tablet Take 81 mg by mouth daily.      . Colesevelam HCl (WELCHOL) 3.75 G PACK Take 3.75 g by mouth daily.      Marland Kitchen desvenlafaxine (PRISTIQ) 50 MG 24 hr tablet Take 50 mg by  mouth daily.      Marland Kitchen esomeprazole (NEXIUM) 40 MG capsule Take 40 mg by mouth daily before breakfast.      . ezetimibe (ZETIA) 10 MG tablet Take 1 tablet (10 mg total) by mouth daily.  30 tablet  11  . KRILL OIL PO Take 4 capsules by mouth daily.      Marland Kitchen lisinopril (PRINIVIL,ZESTRIL) 5 MG tablet Take 5 mg by mouth daily.      . simvastatin (ZOCOR) 40 MG tablet TAKE 1 TABLET BY MOUTH EVERY NIGHT AT BEDTIME  30 tablet  6  . zolpidem (AMBIEN) 10 MG tablet Take 10 mg by mouth at bedtime.       No current facility-administered medications for this visit.    Allergies:    Allergies  Allergen Reactions  . Guaifenesin & Derivatives Itching  . Morphine And Related Itching    Hydrocodone and oxycodone  . Penicillins Itching  . Tramadol Itching  . Hydrocodone-Acetaminophen Itching  . Oxycodone Hcl Itching    Social History:  The patient  reports that she has never smoked. She has never used smokeless tobacco. She reports  that she drinks about 3.6 ounces of alcohol per week. She reports that she does not use illicit drugs.   Family History:  The patient's family history includes CAD in her father; CVA in her mother; Cancer - Prostate in her father; Heart disease in her father; Hypertension in her sister.   ROS:  Please see the history of present illness.      All other systems reviewed and negative.   PHYSICAL EXAM: VS:  BP 130/75  Pulse 81  Ht 5\' 7"  (1.702 m)  Wt 227 lb (102.967 kg)  BMI 35.55 kg/m2 Well nourished, well developed, in no acute distress HEENT: normal Neck: no JVD Cardiac:  normal S1, S2; RRR; no murmur Lungs:  clear to auscultation bilaterally, no wheezing, rhonchi or rales Abd: soft, nontender, no hepatomegaly Ext: no edema Skin: warm and dry Neuro:  CNs 2-12 intact, no focal abnormalities noted       ASSESSMENT AND PLAN:  1. Nonobstructive ASCAD  - continue ASA 2. Dyslipidemia - her LDL is 93 and should be less than 70 for CAD with HTN and her triglycerides are  elevated as well  - continue Zetia/Welchol/Zocor  - refer to lipid clinic 3. HTN - controlled  - continue Lisinopril 4. OSA on CPAP  - She will bring in her download card  Followup with me in 6 months  Signed, Fransico Him, MD 09/22/2013 1:46 PM

## 2013-09-29 ENCOUNTER — Telehealth: Payer: Self-pay | Admitting: Cardiology

## 2013-09-29 NOTE — Telephone Encounter (Signed)
New message     Returning a nurses call.  OK to leave a message on vm

## 2013-09-29 NOTE — Telephone Encounter (Signed)
Spoke with pt, she thinks they may have been calling to get her CPAP card but she is not sure. Aware danielle is not ion the office and will have to call her back tomorrow. Pt agreed with this plan.

## 2013-09-30 ENCOUNTER — Other Ambulatory Visit: Payer: Self-pay | Admitting: General Surgery

## 2013-09-30 ENCOUNTER — Ambulatory Visit: Payer: BC Managed Care – PPO | Admitting: Pharmacist

## 2013-09-30 DIAGNOSIS — E7800 Pure hypercholesterolemia, unspecified: Secondary | ICD-10-CM

## 2013-09-30 NOTE — Telephone Encounter (Signed)
Pt had lab work. Will call back when office opens today

## 2013-09-30 NOTE — Telephone Encounter (Signed)
Called and LVM for pt to make aware.

## 2013-10-04 ENCOUNTER — Telehealth: Payer: Self-pay | Admitting: Pharmacist

## 2013-10-04 NOTE — Telephone Encounter (Signed)
Patient was set up with Lipid Clinic on 09/22/13 and suppose to be seen on 10/07/13.  I already addressed her lab work and she was called about this by you on 09/30/13.  She can't tolerate more potent agents and doesn't qualify for PCSK-9 studies.  Please call her and let her know she doesn't need to keep her 10/07/13 appointment with lipid clinic since this has already been addressed by phone with her lab work on 09/30/13.  Please cancel the 10/07/13 lipid clinic appointment once you have spoken with her.  Thanks.

## 2013-10-06 NOTE — Telephone Encounter (Signed)
LVM for pt to return call to make aware.

## 2013-10-06 NOTE — Telephone Encounter (Signed)
Pt made aware and appt cancelled.

## 2013-10-07 ENCOUNTER — Ambulatory Visit: Payer: BC Managed Care – PPO | Admitting: Pharmacist

## 2013-11-21 ENCOUNTER — Other Ambulatory Visit: Payer: Self-pay | Admitting: Neurosurgery

## 2013-11-21 DIAGNOSIS — M5412 Radiculopathy, cervical region: Secondary | ICD-10-CM

## 2013-11-25 ENCOUNTER — Ambulatory Visit
Admission: RE | Admit: 2013-11-25 | Discharge: 2013-11-25 | Disposition: A | Discharge status: Home / Self Care | Payer: BC Managed Care – PPO | Source: Ambulatory Visit | Attending: Neurosurgery | Admitting: Neurosurgery

## 2013-11-25 DIAGNOSIS — M5412 Radiculopathy, cervical region: Secondary | ICD-10-CM

## 2013-12-08 ENCOUNTER — Other Ambulatory Visit: Payer: Self-pay | Admitting: Family Medicine

## 2013-12-08 DIAGNOSIS — R1310 Dysphagia, unspecified: Secondary | ICD-10-CM

## 2013-12-15 ENCOUNTER — Other Ambulatory Visit: Payer: BC Managed Care – PPO

## 2013-12-16 ENCOUNTER — Other Ambulatory Visit: Payer: BC Managed Care – PPO

## 2013-12-17 ENCOUNTER — Encounter (INDEPENDENT_AMBULATORY_CARE_PROVIDER_SITE_OTHER): Payer: Self-pay

## 2013-12-17 ENCOUNTER — Ambulatory Visit
Admission: RE | Admit: 2013-12-17 | Discharge: 2013-12-17 | Disposition: A | Discharge status: Home / Self Care | Payer: BC Managed Care – PPO | Source: Ambulatory Visit | Attending: Family Medicine | Admitting: Family Medicine

## 2013-12-17 DIAGNOSIS — R1310 Dysphagia, unspecified: Secondary | ICD-10-CM

## 2013-12-23 ENCOUNTER — Other Ambulatory Visit: Payer: Self-pay | Admitting: Cardiology

## 2013-12-29 ENCOUNTER — Encounter: Payer: Self-pay | Admitting: Cardiology

## 2014-02-01 ENCOUNTER — Other Ambulatory Visit: Payer: Self-pay | Admitting: Cardiology

## 2014-02-18 ENCOUNTER — Other Ambulatory Visit: Payer: Self-pay

## 2014-04-02 ENCOUNTER — Other Ambulatory Visit: Payer: BC Managed Care – PPO

## 2014-04-03 ENCOUNTER — Ambulatory Visit: Payer: BC Managed Care – PPO | Admitting: Cardiology

## 2014-04-08 ENCOUNTER — Other Ambulatory Visit: Payer: Self-pay | Admitting: *Deleted

## 2014-04-08 MED ORDER — COLESEVELAM HCL 3.75 G PO PACK: 3.7500 g | PACK | Freq: Every day | ORAL | Status: DC

## 2014-05-14 ENCOUNTER — Other Ambulatory Visit: Payer: BC Managed Care – PPO

## 2014-05-15 ENCOUNTER — Ambulatory Visit: Payer: BC Managed Care – PPO | Admitting: Cardiology

## 2014-05-15 ENCOUNTER — Other Ambulatory Visit (INDEPENDENT_AMBULATORY_CARE_PROVIDER_SITE_OTHER): Payer: BC Managed Care – PPO | Admitting: *Deleted

## 2014-05-15 DIAGNOSIS — E7800 Pure hypercholesterolemia, unspecified: Secondary | ICD-10-CM

## 2014-05-15 LAB — HEPATIC FUNCTION PANEL
ALBUMIN: 3.1 g/dL — AB (ref 3.5–5.2)
ALT: 19 U/L (ref 0–35)
AST: 19 U/L (ref 0–37)
Alkaline Phosphatase: 78 U/L (ref 39–117)
BILIRUBIN DIRECT: 0 mg/dL (ref 0.0–0.3)
BILIRUBIN TOTAL: 0.3 mg/dL (ref 0.2–1.2)
Total Protein: 6.8 g/dL (ref 6.0–8.3)

## 2014-05-15 LAB — LIPID PANEL
CHOL/HDL RATIO: 4
Cholesterol: 183 mg/dL (ref 0–200)
HDL: 46.8 mg/dL (ref >39.00)
LDL Cholesterol: 111 mg/dL — ABNORMAL HIGH (ref 0–99)
NonHDL: 136.2
Triglycerides: 128 mg/dL (ref 0.0–149.0)
VLDL: 25.6 mg/dL (ref 0.0–40.0)

## 2014-05-18 ENCOUNTER — Encounter: Payer: Self-pay | Admitting: Cardiology

## 2014-05-18 ENCOUNTER — Ambulatory Visit (INDEPENDENT_AMBULATORY_CARE_PROVIDER_SITE_OTHER): Payer: BC Managed Care – PPO | Admitting: Cardiology

## 2014-05-18 VITALS — BP 138/92 | HR 82 | Ht 67.0 in | Wt 227.0 lb

## 2014-05-18 DIAGNOSIS — E7800 Pure hypercholesterolemia, unspecified: Secondary | ICD-10-CM

## 2014-05-18 DIAGNOSIS — G4733 Obstructive sleep apnea (adult) (pediatric): Secondary | ICD-10-CM

## 2014-05-18 DIAGNOSIS — I2583 Coronary atherosclerosis due to lipid rich plaque: Principal | ICD-10-CM

## 2014-05-18 DIAGNOSIS — I10 Essential (primary) hypertension: Secondary | ICD-10-CM

## 2014-05-18 DIAGNOSIS — I2510 Atherosclerotic heart disease of native coronary artery without angina pectoris: Secondary | ICD-10-CM

## 2014-05-18 NOTE — Progress Notes (Signed)
[place], [address], [place]  [address] Phone: [phone] Fax:  [phone]  Date:  05/18/2014   ID:  [name], DOB [birth year], MRN [id number]  PCP:  Tawanna Solo, MD  Cardiologist:  Fransico Him, MD    History of Present Illness: [name] is a 63 y.o. female with a history of nonobstructive ASCAD with 20% OM, HTN, dyslipidemia, and OSA on CPAP who presents today for followup. She is doing well. She denies any chest pain, SOB, DOE, LE edema, dizziness, palpitations or syncope. She tolerates her CPAP well but has not been using it for a month but plans to get back into using it. She tolerates the nasal mask and feels the pressure is adequate. When using her CPAP she feels rested in the am.  She has not been exercising for a while because of a back injury after a fall.   Wt Readings from Last 3 Encounters:  05/18/14 227 lb (102.967 kg)  09/22/13 227 lb (102.967 kg)  01/23/13 227 lb (102.967 kg)     Past Medical History  Diagnosis Date  . Hypertension   . GERD (gastroesophageal reflux disease)   . Arthritis   . Depression   . High cholesterol   . H/O hiatal hernia   . Kidney stones   . Sleep apnea     CPAP had Sleep Study ordered by Dr. Radford Pax  . Vasovagal syncope   . Obesity   . Diastolic dysfunction     w Elevated LVEDP  . Right knee pain 12/2009    Dr Marlou Sa  . Chronic fatigue     and weakness  . Iron deficiency anemia   . Multiple thyroid nodules   . Hypoglycemia   . Neurocardiogenic syncope   . DJD (degenerative joint disease)     C3/4 Dr Sherwood Gambler  . Prediabetes 2011 & 2012  . Coronary artery disease     nonobstructive with 20% OM by cath 2006    Current Outpatient Prescriptions  Medication Sig Dispense Refill  . ALPRAZOLAM ER PO Take 10 mg by mouth.      Marland Kitchen aspirin 81 MG tablet Take 81 mg by mouth daily.      . Colesevelam HCl (WELCHOL) 3.75 G PACK Take 1 packet (3.8 g total) by mouth daily.  30 each  0  . desvenlafaxine  (PRISTIQ) 50 MG 24 hr tablet Take 50 mg by mouth daily.      Marland Kitchen esomeprazole (NEXIUM) 40 MG capsule Take 40 mg by mouth daily before breakfast.      . ezetimibe (ZETIA) 10 MG tablet Take 1 tablet (10 mg total) by mouth daily.  30 tablet  11  . KRILL OIL PO Take 4 capsules by mouth daily.      Marland Kitchen lisinopril (PRINIVIL,ZESTRIL) 5 MG tablet TAKE 1 TABLET BY MOUTH EVERY DAY  30 tablet  3  . simvastatin (ZOCOR) 40 MG tablet TAKE 1 TABLET BY MOUTH EVERY NIGHT AT BEDTIME  30 tablet  3  . zolpidem (AMBIEN) 10 MG tablet Take 10 mg by mouth at bedtime.       No current facility-administered medications for this visit.    Allergies:    Allergies  Allergen Reactions  . Guaifenesin & Derivatives Itching  . Morphine And Related Itching    Hydrocodone and oxycodone  . Penicillins Itching  . Tramadol Itching  . Hydrocodone-Acetaminophen Itching  . Oxycodone Hcl Itching    Social History:  The patient  reports that she has never smoked. She has never used smokeless tobacco. She reports that she drinks about 3.6 ounces of alcohol per week. She reports that she does not use illicit drugs.   Family History:  The patient's family history includes CAD in her father; CVA in her mother; Cancer - Prostate in her father; Heart disease in her father; Hypertension in her sister.   ROS:  Please see the history of present illness.      All other systems reviewed and negative.   PHYSICAL EXAM: VS:  BP 138/92  Pulse 82  Ht 5\' 7"  (1.702 m)  Wt 227 lb (102.967 kg)  BMI 35.55 kg/m2 Well nourished, well developed, in no acute distress HEENT: normal Neck: no JVD Cardiac:  normal S1, S2; RRR; no murmur Lungs:  clear to auscultation bilaterally, no wheezing, rhonchi or rales Abd: soft, nontender, no hepatomegaly Ext: no edema Skin: warm and dry Neuro:  CNs 2-12 intact, no focal abnormalities noted     ASSESSMENT AND PLAN:  1. Nonobstructive ASCAD - continue ASA        2.  Dyslipidemia - her LDL is 11 and  should be less than 70 for CAD with HTN.  She has not been exercising and cannot tolerate a higher dose of statin - continue Zetia/Welchol/Zocor - refer to lipid clinic to see if she is a candidate for the PCSK9 shots  3. HTN - controlled - continue Lisinopril  4. OSA on CPAP - I have encouraged her to start using her CPAP and I will get a d/l at her next OV   Followup with me in 6 months   Signed, Fransico Him, MD [facility] 05/18/2014 3:57 PM

## 2014-05-18 NOTE — Patient Instructions (Signed)
Your physician wants you to follow-up in: 6 months with Dr. Radford Pax. You will receive a reminder letter in the mail two months in advance. If you don't receive a letter, please call our office to schedule the follow-up appointment.  Your have been referred to the [facility]. An appointment will be made before you leave today.

## 2014-05-19 ENCOUNTER — Ambulatory Visit (INDEPENDENT_AMBULATORY_CARE_PROVIDER_SITE_OTHER): Payer: BC Managed Care – PPO | Admitting: Pharmacist Clinician (PhC)/ Clinical Pharmacy Specialist

## 2014-05-19 ENCOUNTER — Ambulatory Visit: Payer: BC Managed Care – PPO | Admitting: Pharmacist

## 2014-05-19 VITALS — Ht 67.0 in | Wt 227.5 lb

## 2014-05-19 DIAGNOSIS — E7800 Pure hypercholesterolemia, unspecified: Secondary | ICD-10-CM

## 2014-05-19 DIAGNOSIS — Z23 Encounter for immunization: Secondary | ICD-10-CM

## 2014-05-19 NOTE — Progress Notes (Signed)
05/19/2014 [name] 17-Jun-1951 [id number]   HPI:  [name] is a 63 y.o. female patient of Dr Radford Pax, who presents today for a lipid clinic evaluation.  She has a history of non-obstructive ASCAD with 20% OM.  Her last cardiac catheterization was approximately 10 years ago.  She also has hypertension, hyperlipidemia and OSA.  She has been sporadic with her CPAP use in the past, but is trying to get back to using daily.      Meds:  Simvastatin 40mg , Zetia 10mg , Welchol 3.75gm, Krill oil 4gm Intolerant to lipitor and crestor  Family history: father had CAD, as did paternal grandfather, stroke - mother, hypertension - sister  Diet: admits to probably not as healthy as she could be.  Does eat granola/cereal for breakfast daily Exercise:  Less since fall - hurt her lower back.  Is still trying to walk 5x/week, but isn't going nearly as far or as fast  Labs:  04/2014  TC 183; HDL 46.8; TG 128; LDL 111; nonHDL 136.2 08/2013   TC 165  HDL 49;  TG 203;  nonHDL 106 01/2013   TC 180; HDL 42; LDL 94; nonHDL 138   Current Outpatient Prescriptions  Medication Sig Dispense Refill  . ALPRAZOLAM ER PO Take 10 mg by mouth.      Marland Kitchen aspirin 81 MG tablet Take 81 mg by mouth daily.      . Colesevelam HCl (WELCHOL) 3.75 G PACK Take 1 packet (3.8 g total) by mouth daily.  30 each  0  . desvenlafaxine (PRISTIQ) 50 MG 24 hr tablet Take 50 mg by mouth daily.      Marland Kitchen esomeprazole (NEXIUM) 40 MG capsule Take 40 mg by mouth daily before breakfast.      . ezetimibe (ZETIA) 10 MG tablet Take 1 tablet (10 mg total) by mouth daily.  30 tablet  11  . KRILL OIL PO Take 4 capsules by mouth daily.      Marland Kitchen lisinopril (PRINIVIL,ZESTRIL) 5 MG tablet TAKE 1 TABLET BY MOUTH EVERY DAY  30 tablet  3  . simvastatin (ZOCOR) 40 MG tablet TAKE 1 TABLET BY MOUTH EVERY NIGHT AT BEDTIME  30 tablet  3  . zolpidem (AMBIEN) 10 MG tablet Take 10 mg by mouth at bedtime.       No current facility-administered medications for  this visit.    Allergies  Allergen Reactions  . Guaifenesin & Derivatives Itching  . Morphine And Related Itching    Hydrocodone and oxycodone  . Penicillins Itching  . Tramadol Itching  . Hydrocodone-Acetaminophen Itching  . Oxycodone Hcl Itching    Past Medical History  Diagnosis Date  . Hypertension   . GERD (gastroesophageal reflux disease)   . Arthritis   . Depression   . High cholesterol   . H/O hiatal hernia   . Kidney stones   . Sleep apnea     CPAP had Sleep Study ordered by Dr. Radford Pax  . Vasovagal syncope   . Obesity   . Diastolic dysfunction     w Elevated LVEDP  . Right knee pain 12/2009    Dr Marlou Sa  . Chronic fatigue     and weakness  . Iron deficiency anemia   . Multiple thyroid nodules   . Hypoglycemia   . Neurocardiogenic syncope   . DJD (degenerative joint disease)     C3/4 Dr Sherwood Gambler  . Prediabetes 2011 & 2012  . Coronary artery disease     nonobstructive with  20% OM by cath 2006    Height 5\' 7"  (1.702 m), weight 227 lb 8 oz (103.193 kg).   ASSESSMENT AND PLAN: At this time the patient does not qualify for PCSK9 because no diagnosis of ASCVD.  We will attempt to get records from [facility] and determine what her baseline LDL is.  If high enough we may be able to qualify her as familial hyperlipidemia.  Until then, we will continue her on her current regimen and have encouraged her to watch her diet.  We will plan to see her back in 3-6 months.  Tommy Medal PharmD CPP [facility]

## 2014-05-22 ENCOUNTER — Ambulatory Visit: Payer: BC Managed Care – PPO | Admitting: Pharmacist

## 2014-06-02 NOTE — Progress Notes (Signed)
Note is note complete to close

## 2014-06-15 ENCOUNTER — Other Ambulatory Visit: Payer: Self-pay | Admitting: *Deleted

## 2014-06-15 MED ORDER — COLESEVELAM HCL 3.75 G PO PACK: 3.7500 g | PACK | Freq: Every day | ORAL | Status: DC

## 2014-06-19 ENCOUNTER — Other Ambulatory Visit: Payer: Self-pay

## 2014-06-19 MED ORDER — EZETIMIBE 10 MG PO TABS: 10.0000 mg | ORAL_TABLET | Freq: Every day | ORAL | Status: DC

## 2014-06-26 ENCOUNTER — Other Ambulatory Visit: Payer: Self-pay

## 2014-06-26 MED ORDER — SIMVASTATIN 40 MG PO TABS: 40.0000 mg | ORAL_TABLET | Freq: Every day | ORAL | Status: DC

## 2014-07-10 ENCOUNTER — Other Ambulatory Visit: Payer: Self-pay | Admitting: *Deleted

## 2014-07-10 MED ORDER — LISINOPRIL 5 MG PO TABS: 5.0000 mg | ORAL_TABLET | Freq: Every day | ORAL | Status: DC

## 2014-08-28 ENCOUNTER — Encounter: Payer: Self-pay | Admitting: Cardiology

## 2014-09-30 ENCOUNTER — Telehealth: Payer: Self-pay | Admitting: Cardiology

## 2014-09-30 DIAGNOSIS — E7800 Pure hypercholesterolemia, unspecified: Secondary | ICD-10-CM

## 2014-09-30 NOTE — Telephone Encounter (Signed)
New problem   Pt want to know if she need labs before her appt with Dr Radford Pax. Please advise pt.

## 2014-09-30 NOTE — Telephone Encounter (Signed)
Left message to call back  

## 2014-09-30 NOTE — Telephone Encounter (Signed)
Needs FLP and ALT

## 2014-09-30 NOTE — Telephone Encounter (Signed)
To Dr. Turner.  

## 2014-10-02 NOTE — Telephone Encounter (Signed)
Patient to have [place] and ALT Friday, April 15 to discuss at appointment the following Monday.  Patient agrees with treatment plan.

## 2014-10-30 ENCOUNTER — Other Ambulatory Visit: Payer: Self-pay | Admitting: Family Medicine

## 2014-10-30 ENCOUNTER — Ambulatory Visit
Admission: RE | Admit: 2014-10-30 | Discharge: 2014-10-30 | Disposition: A | Discharge status: Home / Self Care | Payer: BC Managed Care – PPO | Source: Ambulatory Visit | Attending: Family Medicine | Admitting: Family Medicine

## 2014-10-30 DIAGNOSIS — J189 Pneumonia, unspecified organism: Secondary | ICD-10-CM

## 2014-11-13 ENCOUNTER — Other Ambulatory Visit: Payer: BC Managed Care – PPO

## 2014-11-16 ENCOUNTER — Other Ambulatory Visit: Payer: Self-pay | Admitting: Cardiology

## 2014-11-16 ENCOUNTER — Ambulatory Visit: Payer: BC Managed Care – PPO | Admitting: Cardiology

## 2014-12-08 ENCOUNTER — Other Ambulatory Visit: Payer: Self-pay | Admitting: Cardiology

## 2014-12-09 ENCOUNTER — Other Ambulatory Visit: Payer: Self-pay

## 2014-12-09 MED ORDER — LISINOPRIL 5 MG PO TABS: 5.0000 mg | ORAL_TABLET | Freq: Every day | ORAL | Status: DC

## 2014-12-09 MED ORDER — SIMVASTATIN 40 MG PO TABS: 40.0000 mg | ORAL_TABLET | Freq: Every day | ORAL | Status: DC

## 2014-12-29 ENCOUNTER — Other Ambulatory Visit: Payer: BC Managed Care – PPO

## 2014-12-30 ENCOUNTER — Other Ambulatory Visit (INDEPENDENT_AMBULATORY_CARE_PROVIDER_SITE_OTHER): Payer: BC Managed Care – PPO | Admitting: *Deleted

## 2014-12-30 DIAGNOSIS — E7800 Pure hypercholesterolemia, unspecified: Secondary | ICD-10-CM

## 2014-12-30 LAB — LIPID PANEL
Cholesterol: 158 mg/dL (ref 0–200)
HDL: 44.5 mg/dL (ref >39.00)
LDL Cholesterol: 82 mg/dL (ref 0–99)
NONHDL: 113.5
TRIGLYCERIDES: 160 mg/dL — AB (ref 0.0–149.0)
Total CHOL/HDL Ratio: 4
VLDL: 32 mg/dL (ref 0.0–40.0)

## 2014-12-30 LAB — ALT: ALT: 35 U/L (ref 0–35)

## 2014-12-31 NOTE — Progress Notes (Signed)
Cardiology Office Note   Date:  01/01/2015   ID:  [name], DOB 1951-07-07, MRN [id number]  PCP:  Tawanna Solo, MD    Chief Complaint  Patient presents with  . Follow-up    OSA      History of Present Illness: [name] is a 64 y.o. female with a history of nonobstructive ASCAD with 20% OM, HTN, dyslipidemia, and OSA on CPAP who presents today for followup. She is doing well. She denies any chest pain, dizziness, palpitations or syncope. She has noticed some SOB but has gained 8 lbs.  The SOB is enough to affect her ability to do some housework. She tolerates her CPAP well. She tolerates the nasal mask and feels the pressure is adequate. She sleeps well all night.  When using her CPAP she feels rested in the am. She occasionally she will fall asleep at the movies at night if she has just eaten.     Past Medical History  Diagnosis Date  . Hypertension   . GERD (gastroesophageal reflux disease)   . Arthritis   . Depression   . High cholesterol   . H/O hiatal hernia   . Kidney stones   . Sleep apnea     CPAP had Sleep Study ordered by Dr. Radford Pax  . Vasovagal syncope   . Obesity   . Diastolic dysfunction     w Elevated LVEDP  . Right knee pain 12/2009    Dr Marlou Sa  . Chronic fatigue     and weakness  . Iron deficiency anemia   . Multiple thyroid nodules   . Hypoglycemia   . Neurocardiogenic syncope   . DJD (degenerative joint disease)     C3/4 Dr Sherwood Gambler  . Prediabetes 2011 & 2012  . Coronary artery disease     nonobstructive with 20% OM by cath 2006    Past Surgical History  Procedure Laterality Date  . Anterior fusion cervical spine      x2-cervical  . Carpal tunnel release      L side  . Tonsillectomy    . Total knee arthroplasty  11/2010  . Total knee arthroplasty  11/2011    left  . Lithotripsy    . Total knee arthroplasty  12/13/2011    Procedure: TOTAL KNEE ARTHROPLASTY;  Surgeon: Ninetta Lights, MD;  Location: [facility];  Service: Orthopedics;  Laterality: Left;  DR MURPHY WANTS 90 MINUTES FOR THIS CASE  . Breast cyst excision      left  . Joint replacement  2012    R total knee  . Joint replacement  2013    lt total knee  . Achilles tendon surgery Left 01/23/2013    Procedure: EXCISION PARTIAL BONE TALUS/CALCANEUS, REPAIR RUPTURE ACHILLES TENDON PRIMARY OPEN ;  Surgeon: Ninetta Lights, MD;  Location: [facility];  Service: Orthopedics;  Laterality: Left;  . Cardiac catheterization  ~ 2008    Nonobstructive ASCAD, 20% OM     Current Outpatient Prescriptions  Medication Sig Dispense Refill  . ALPRAZolam (XANAX) 1 MG tablet Take 1 mg by mouth at bedtime.  5  . aspirin 81 MG tablet Take 81 mg by mouth daily.    . Colesevelam HCl (WELCHOL) 3.75 G PACK Take 1 packet (3.8 g total) by mouth daily. 30 each 5  . desvenlafaxine (PRISTIQ) 50 MG 24 hr tablet Take 50  mg by mouth daily.    Marland Kitchen esomeprazole (NEXIUM) 40 MG capsule Take 40 mg by mouth daily before breakfast.    . KRILL OIL PO Take 4 capsules by mouth daily.    Marland Kitchen lisinopril (PRINIVIL,ZESTRIL) 5 MG tablet Take 1 tablet (5 mg total) by mouth daily. 30 tablet 0  . simvastatin (ZOCOR) 40 MG tablet Take 1 tablet (40 mg total) by mouth at bedtime. 30 tablet 0  . ZETIA 10 MG tablet TAKE 1 TABLET (10 MG TOTAL) BY MOUTH DAILY. 30 tablet 1  . zolpidem (AMBIEN) 10 MG tablet Take 10 mg by mouth at bedtime.     No current facility-administered medications for this visit.    Allergies:   Guaifenesin & derivatives; Morphine and related; Penicillins; Tramadol; Hydrocodone-acetaminophen; and Oxycodone hcl    Social History:  The patient  reports that she has never smoked. She has never used smokeless tobacco. She reports that she drinks about 3.6 oz of alcohol per week. She reports that she does not use illicit drugs.   Family History:  The patient's family history includes CAD in her father; CVA in her mother; Cancer - Prostate in her father; Heart  disease in her father; Hypertension in her sister.    ROS:  Please see the history of present illness.   Otherwise, review of systems are positive for none.   All other systems are reviewed and negative.    PHYSICAL EXAM: VS:  BP 124/78 mmHg  Pulse 87  Ht 5\' 7"  (1.702 m)  Wt 235 lb 12.8 oz (106.958 kg)  BMI 36.92 kg/m2  SpO2 94% , BMI Body mass index is 36.92 kg/(m^2). GEN: Well nourished, well developed, in no acute distress HEENT: normal Neck: no JVD, carotid bruits, or masses Cardiac: RRR; no murmurs, rubs, or gallops,no edema  Respiratory:  clear to auscultation bilaterally, normal work of breathing GI: soft, nontender, nondistended, + BS MS: no deformity or atrophy Skin: warm and dry, no rash Neuro:  Strength and sensation are intact Psych: euthymic mood, full affect   EKG:  EKG is not ordered today.    Recent Labs: 12/30/2014: ALT 35    Lipid Panel    Component Value Date/Time   CHOL 158 12/30/2014 1201   TRIG 160.0* 12/30/2014 1201   HDL 44.50 12/30/2014 1201   CHOLHDL 4 12/30/2014 1201   VLDL 32.0 12/30/2014 1201   LDLCALC 82 12/30/2014 1201   LDLDIRECT 93.3 09/17/2013 1307      Wt Readings from Last 3 Encounters:  01/01/15 235 lb 12.8 oz (106.958 kg)  05/19/14 227 lb 8 oz (103.193 kg)  05/18/14 227 lb (102.967 kg)    ASSESSMENT AND PLAN:  1. Nonobstructive ASCAD - continue ASA   2. Dyslipidemia - her LDL is 82 and should be less than 70 for CAD with HTN. She has not been exercising and cannot tolerate a higher dose of statin.  Her last LDL was 82. She will continue to try to follow a low fat low carb diet. - continue Zetia/Welchol/Zocor - not a candidate at this time for PCSK9 3. HTN - controlled - continue Lisinopril  4. OSA on CPAP - her d/l today showed an AHI of 1.6/hr on 12cm H2O.   5. Exertional SOB that is new.  She has gained some weight that could account for this but given her history of nonobstructive CAD I think we need to  assess for ischemia with lexiscan myoview.  I will get a 2D echo to  assess LVF 6. Obesity - I have encouraged her to get into an exercise program and watch her portions of food    Current medicines are reviewed at length with the patient today.  The patient does not have concerns regarding medicines.  The following changes have been made:  no change  Labs/ tests ordered today: See above Assessment and Plan No orders of the defined types were placed in this encounter.     Disposition:   FU with me in 1 year and PA in 6 months  Signed, [name], MD  01/01/2015 3:36 PM    [facility] [address], [place] [place], [place]  [address] Phone: [phone]; Fax: [phone]

## 2015-01-01 ENCOUNTER — Ambulatory Visit (INDEPENDENT_AMBULATORY_CARE_PROVIDER_SITE_OTHER): Payer: BC Managed Care – PPO | Admitting: Cardiology

## 2015-01-01 ENCOUNTER — Other Ambulatory Visit: Payer: Self-pay

## 2015-01-01 ENCOUNTER — Encounter: Payer: Self-pay | Admitting: Cardiology

## 2015-01-01 VITALS — BP 124/78 | HR 87 | Ht 67.0 in | Wt 235.8 lb

## 2015-01-01 DIAGNOSIS — G4733 Obstructive sleep apnea (adult) (pediatric): Secondary | ICD-10-CM | POA: Diagnosis not present

## 2015-01-01 DIAGNOSIS — I2510 Atherosclerotic heart disease of native coronary artery without angina pectoris: Secondary | ICD-10-CM

## 2015-01-01 DIAGNOSIS — E7800 Pure hypercholesterolemia, unspecified: Secondary | ICD-10-CM

## 2015-01-01 DIAGNOSIS — I10 Essential (primary) hypertension: Secondary | ICD-10-CM | POA: Diagnosis not present

## 2015-01-01 DIAGNOSIS — R0602 Shortness of breath: Secondary | ICD-10-CM

## 2015-01-01 NOTE — Patient Instructions (Signed)
Medication Instructions:  Your physician recommends that you continue on your current medications as directed. Please refer to the Current Medication list given to you today.   Labwork: None  Testing/Procedures: Your physician has requested that you have an echocardiogram. Echocardiography is a painless test that uses sound waves to create images of your heart. It provides your doctor with information about the size and shape of your heart and how well your heart's chambers and valves are working. This procedure takes approximately one hour. There are no restrictions for this procedure.   Your physician has requested that you have a lexiscan myoview. For further information please visit [email or web address]. Please follow instruction sheet, as given.  Follow-Up: Your physician recommends that you schedule a follow-up appointment with LIPID CLINIC.  Your physician wants you to follow-up in: 6 months with Dr. Radford Pax. You will receive a reminder letter in the mail two months in advance. If you don't receive a letter, please call our office to schedule the follow-up appointment.   Any Other Special Instructions Will Be Listed Below (If Applicable).

## 2015-01-03 ENCOUNTER — Other Ambulatory Visit: Payer: Self-pay | Admitting: Cardiology

## 2015-01-04 ENCOUNTER — Telehealth: Payer: Self-pay | Admitting: Cardiology

## 2015-01-04 NOTE — Telephone Encounter (Signed)
New Message       Pt calling wanting to know if she is still supposed to get set up with the lipid clinic or not. Please call back and advise.

## 2015-01-04 NOTE — Telephone Encounter (Signed)
Informed patient that [facility] will call her to schedule OV, and apologized it was missed at check-out. Patient grateful for callback.  Reminder message sent to check-out to schedule.

## 2015-01-08 ENCOUNTER — Telehealth: Payer: Self-pay

## 2015-01-08 NOTE — Telephone Encounter (Signed)
Patient requests to go over recent labs in detail. Reviewed lab work to patient's satisfaction. Patient irritated that Lipid Clinic appointment still has not been scheduled.  Another message sent to [facility] to call patient today to schedule appointment.

## 2015-01-11 ENCOUNTER — Ambulatory Visit (INDEPENDENT_AMBULATORY_CARE_PROVIDER_SITE_OTHER): Payer: BC Managed Care – PPO | Admitting: Pharmacist

## 2015-01-11 DIAGNOSIS — E7800 Pure hypercholesterolemia, unspecified: Secondary | ICD-10-CM

## 2015-01-11 NOTE — Patient Instructions (Signed)
Stop Welchol  Continue your other medications.    If your stress test is okay, we will follow up in 6 months with the PA.  If there are changes with the stress test, we will discuss if other changes need to be made.

## 2015-01-11 NOTE — Progress Notes (Signed)
01/11/2015 [name] Aug 25, 1950 [id number]   HPI:  [name] is a 64 y.o. female patient of Dr Radford Pax, who presents today for a lipid clinic evaluation.  She has a history of non-obstructive ASCAD with 20% OM.  Her last cardiac catheterization was approximately 10 years ago.  She also has hypertension, hyperlipidemia and OSA.  At her last visit with Dr. Radford Pax, she noted some increase SOB and weight gain so she is scheduled to undergo a stress test at the end of the month.  Pt reports doing well with her medications since last visit. She does not take her Welchol on a regular basis due to trying to make sure it is several hours apart from all of her other medications.      Meds:  Simvastatin 40mg , Zetia 10mg , Welchol 3.75gm, Krill oil 4gm Intolerant to lipitor and crestor LDL goal <100 mg/dL  Family history: father had CAD, as did paternal grandfather, stroke - mother, hypertension - sister  Diet: admits to probably not as healthy as she could be.  Does eat granola/cereal for breakfast daily Exercise:  Is trying to walk 30-40 minutes each day with her dog.    Labs:  12/2014: TC 158, HDL 44.5, TG 160, LDL 82, non-HDL 113.5, LFTs normal 04/2014  TC 183; HDL 46.8; TG 128; LDL 111; nonHDL 136.2 08/2013   TC 165  HDL 49;  TG 203;  nonHDL 106 01/2013   TC 180; HDL 42; LDL 94; nonHDL 138   Current Outpatient Prescriptions  Medication Sig Dispense Refill  . ALPRAZolam (XANAX) 1 MG tablet Take 1 mg by mouth at bedtime.  5  . aspirin 81 MG tablet Take 81 mg by mouth daily.    Marland Kitchen desvenlafaxine (PRISTIQ) 50 MG 24 hr tablet Take 50 mg by mouth daily.    Marland Kitchen esomeprazole (NEXIUM) 40 MG capsule Take 40 mg by mouth daily before breakfast.    . KRILL OIL PO Take 4 capsules by mouth daily.    Marland Kitchen lisinopril (PRINIVIL,ZESTRIL) 5 MG tablet TAKE 1 TABLET BY MOUTH EVERY DAY 30 tablet 5  . simvastatin (ZOCOR) 40 MG tablet Take 1 tablet (40 mg total) by mouth at bedtime. 30 tablet 0  . ZETIA 10 MG  tablet TAKE 1 TABLET (10 MG TOTAL) BY MOUTH DAILY. 30 tablet 1  . zolpidem (AMBIEN) 10 MG tablet Take 10 mg by mouth at bedtime.     No current facility-administered medications for this visit.    Allergies  Allergen Reactions  . Guaifenesin & Derivatives Itching  . Morphine And Related Itching    Hydrocodone and oxycodone  . Penicillins Itching  . Tramadol Itching  . Hydrocodone-Acetaminophen Itching  . Oxycodone Hcl Itching    Past Medical History  Diagnosis Date  . Hypertension   . GERD (gastroesophageal reflux disease)   . Arthritis   . Depression   . High cholesterol   . H/O hiatal hernia   . Kidney stones   . Sleep apnea     CPAP had Sleep Study ordered by Dr. Radford Pax  . Vasovagal syncope   . Obesity   . Diastolic dysfunction     w Elevated LVEDP  . Right knee pain 12/2009    Dr Marlou Sa  . Chronic fatigue     and weakness  . Iron deficiency anemia   . Multiple thyroid nodules   . Hypoglycemia   . Neurocardiogenic syncope   . DJD (degenerative joint disease)     C3/4 Dr  Nudelman  . Prediabetes 2011 & 2012  . Coronary artery disease     nonobstructive with 20% OM by cath 2006    There were no vitals taken for this visit.   ASSESSMENT AND PLAN: At this time the patient does not qualify for PCSK9 because no diagnosis of ASCVD. Her LDL has improved on her current regimen.  She is only taking her Welchol a few days of the week.  Since her TG are slightly elevated and LDL improvement not likely due to [facility], will stop therapy and continue with just simvastatin 40mg , Zetia 10mg  and krill oil.  Will have patient follow up in 6 months with PA as previously scheduled or sooner if stress test is positive and LDL goal changes to <70 mg/dL   Elberta Leatherwood,  PharmD CPP [place]

## 2015-01-19 ENCOUNTER — Telehealth: Payer: Self-pay | Admitting: Cardiology

## 2015-01-19 NOTE — Telephone Encounter (Signed)
New Message  Pt returning Katy's pjhone call. Please call back and discuss.

## 2015-01-19 NOTE — Telephone Encounter (Signed)
Informed patient that I did not call her, and I do not see in the chart of anyone who did to forward the message to. Informed patient that if whoever called needs something, they will call again. Patient grateful for callback.

## 2015-01-25 ENCOUNTER — Other Ambulatory Visit (HOSPITAL_COMMUNITY): Payer: BC Managed Care – PPO

## 2015-01-25 ENCOUNTER — Ambulatory Visit (HOSPITAL_COMMUNITY): Payer: BC Managed Care – PPO

## 2015-01-26 ENCOUNTER — Ambulatory Visit (HOSPITAL_COMMUNITY): Payer: BC Managed Care – PPO

## 2015-01-27 ENCOUNTER — Telehealth (HOSPITAL_COMMUNITY): Payer: Self-pay

## 2015-01-27 NOTE — Telephone Encounter (Signed)
Patient given detailed instructions per Myocardial Perfusion Study Information Sheet for test on 02-02-2015 at 9:15, arrive at 7:15am for Echo. Patient Notified to arrive 15 minutes early, and that it is imperative to arrive on time for appointment to keep from having the test rescheduled. Patient verbalized understanding. Oletta Lamas, Yeudiel Mateo A

## 2015-02-02 ENCOUNTER — Ambulatory Visit (HOSPITAL_COMMUNITY): Payer: Medicare PPO | Attending: Cardiology

## 2015-02-02 ENCOUNTER — Other Ambulatory Visit: Payer: Self-pay

## 2015-02-02 ENCOUNTER — Ambulatory Visit (HOSPITAL_BASED_OUTPATIENT_CLINIC_OR_DEPARTMENT_OTHER): Payer: Medicare PPO

## 2015-02-02 DIAGNOSIS — E669 Obesity, unspecified: Secondary | ICD-10-CM | POA: Insufficient documentation

## 2015-02-02 DIAGNOSIS — I10 Essential (primary) hypertension: Secondary | ICD-10-CM | POA: Insufficient documentation

## 2015-02-02 DIAGNOSIS — E785 Hyperlipidemia, unspecified: Secondary | ICD-10-CM | POA: Insufficient documentation

## 2015-02-02 DIAGNOSIS — R0602 Shortness of breath: Secondary | ICD-10-CM | POA: Diagnosis not present

## 2015-02-02 LAB — MYOCARDIAL PERFUSION IMAGING
CHL CUP NUCLEAR SSS: 4
CHL CUP RESTING HR STRESS: 81 {beats}/min
LV dias vol: 69 mL
LV sys vol: 18 mL
NUC STRESS TID: 0.92
Peak HR: 94 {beats}/min
RATE: 0.25
SDS: 3
SRS: 1

## 2015-02-02 MED ORDER — TECHNETIUM TC 99M SESTAMIBI GENERIC - CARDIOLITE
10.6000 | Freq: Once | INTRAVENOUS | Status: AC | PRN
  Administered 2015-02-02: 11 via INTRAVENOUS

## 2015-02-02 MED ORDER — REGADENOSON 0.4 MG/5ML IV SOLN
0.4000 mg | Freq: Once | INTRAVENOUS | Status: AC
  Administered 2015-02-02: 0.4 mg via INTRAVENOUS

## 2015-02-02 MED ORDER — TECHNETIUM TC 99M SESTAMIBI GENERIC - CARDIOLITE
32.7000 | Freq: Once | INTRAVENOUS | Status: AC | PRN
  Administered 2015-02-02: 32.7 via INTRAVENOUS

## 2015-02-03 ENCOUNTER — Ambulatory Visit (HOSPITAL_COMMUNITY): Payer: BC Managed Care – PPO

## 2015-02-04 ENCOUNTER — Telehealth: Payer: Self-pay | Admitting: Cardiology

## 2015-02-04 NOTE — Telephone Encounter (Signed)
Patient called  Test results have not been released for echo and MPI.  Let patient know that the test results have not been released by her physician yet

## 2015-02-04 NOTE — Telephone Encounter (Signed)
New Message  Pt called to discuss the results from the recent test

## 2015-02-09 ENCOUNTER — Telehealth: Payer: Self-pay

## 2015-02-09 DIAGNOSIS — I5189 Other ill-defined heart diseases: Secondary | ICD-10-CM

## 2015-02-09 NOTE — Telephone Encounter (Signed)
-----   Message from [name], MD sent at 02/07/2015  6:50 PM EDT ----- Normal LVF with grade I DD - have patient come in for BNP

## 2015-02-09 NOTE — Telephone Encounter (Signed)
Informed patient of results and verbal understanding expressed.  Patient to come in for BNP tomorrow, 7/13. Patient agrees with treatment plan.

## 2015-02-10 ENCOUNTER — Telehealth: Payer: Self-pay | Admitting: Cardiology

## 2015-02-10 ENCOUNTER — Other Ambulatory Visit: Payer: BC Managed Care – PPO

## 2015-02-10 NOTE — Telephone Encounter (Signed)
New message     Patient calling to say she will not be able to come in the office today for lab work will be on Friday.

## 2015-02-10 NOTE — Telephone Encounter (Signed)
Aware appointment rescheduled.

## 2015-02-12 ENCOUNTER — Other Ambulatory Visit (INDEPENDENT_AMBULATORY_CARE_PROVIDER_SITE_OTHER): Payer: Medicare PPO | Admitting: *Deleted

## 2015-02-12 DIAGNOSIS — I519 Heart disease, unspecified: Secondary | ICD-10-CM | POA: Diagnosis not present

## 2015-02-12 DIAGNOSIS — I5189 Other ill-defined heart diseases: Secondary | ICD-10-CM

## 2015-02-12 NOTE — Addendum Note (Signed)
Addended by: Eulis Foster on: 02/12/2015 02:29 PM   Modules accepted: Orders

## 2015-02-15 ENCOUNTER — Other Ambulatory Visit: Payer: Self-pay

## 2015-02-15 ENCOUNTER — Telehealth: Payer: Self-pay | Admitting: Cardiology

## 2015-02-15 MED ORDER — SIMVASTATIN 40 MG PO TABS: 40.0000 mg | ORAL_TABLET | Freq: Every day | ORAL | Status: DC

## 2015-02-15 NOTE — Telephone Encounter (Signed)
Informed patient that I did not call her, and see no documentation of who may have called. Spoke with Lanny Hurst in the lab, who st he called her. Instructed him to call her back.

## 2015-02-15 NOTE — Telephone Encounter (Signed)
New message     Pt returning call about results.  Please call to discuss

## 2015-02-16 ENCOUNTER — Other Ambulatory Visit (INDEPENDENT_AMBULATORY_CARE_PROVIDER_SITE_OTHER): Payer: Medicare PPO

## 2015-02-16 DIAGNOSIS — E7800 Pure hypercholesterolemia, unspecified: Secondary | ICD-10-CM

## 2015-02-16 LAB — BRAIN NATRIURETIC PEPTIDE: PRO B NATRI PEPTIDE: 10 pg/mL (ref 0.0–100.0)

## 2015-03-09 ENCOUNTER — Encounter: Payer: Medicare PPO | Attending: Family Medicine

## 2015-03-09 VITALS — Ht 66.75 in | Wt 236.5 lb

## 2015-03-09 DIAGNOSIS — Z713 Dietary counseling and surveillance: Secondary | ICD-10-CM | POA: Insufficient documentation

## 2015-03-09 DIAGNOSIS — E119 Type 2 diabetes mellitus without complications: Secondary | ICD-10-CM | POA: Diagnosis present

## 2015-03-11 NOTE — Progress Notes (Signed)
Patient was seen on 03/09/15 for the first of a series of three diabetes self-management courses at [facility].  Patient Education Plan per assessed needs and concerns is to attend four course education program for Diabetes Self Management Education.  The following learning objectives were met by the patient during this class:  Describe diabetes  State some common risk factors for diabetes  Defines the role of glucose and insulin  Identifies type of diabetes and pathophysiology  Describe the relationship between diabetes and cardiovascular risk  State the members of the Healthcare Team  States the rationale for glucose monitoring  State when to test glucose  State their individual Target Range  State the importance of logging glucose readings  Describe how to interpret glucose readings  Identifies A1C target  Explain the correlation between A1c and eAG values  State symptoms and treatment of high blood glucose  State symptoms and treatment of low blood glucose  Explain proper technique for glucose testing  Identifies proper sharps disposal  Handouts given during class include:  Living Well with Diabetes book  Carb Counting and Meal Planning book  Meal Plan Card  Carbohydrate guide  Meal planning worksheet  Low Sodium Flavoring Tips  The diabetes portion plate  D9R to eAG Conversion Chart  Diabetes Medications  Diabetes Recommended Care Schedule  Support Group  Diabetes Success Plan  Core Class Satisfaction Survey  Follow-Up Plan:  Attend core 2

## 2015-03-16 DIAGNOSIS — E119 Type 2 diabetes mellitus without complications: Secondary | ICD-10-CM | POA: Diagnosis not present

## 2015-03-18 NOTE — Progress Notes (Signed)

## 2015-03-21 ENCOUNTER — Other Ambulatory Visit: Payer: Self-pay | Admitting: Cardiology

## 2015-03-23 ENCOUNTER — Other Ambulatory Visit: Payer: Self-pay | Admitting: Cardiology

## 2015-03-23 DIAGNOSIS — E119 Type 2 diabetes mellitus without complications: Secondary | ICD-10-CM

## 2015-03-25 NOTE — Progress Notes (Signed)
Patient was seen on 03/23/15 for the third of a series of three diabetes self-management courses at the [facility].   [name] the amount of activity recommended for healthy living . Describe activities suitable for individual needs . Identify ways to regularly incorporate activity into daily life . Identify barriers to activity and ways to over come these barriers  Identify diabetes medications being personally used and their primary action for lowering glucose and possible side effects . Describe role of stress on blood glucose and develop strategies to address psychosocial issues . Identify diabetes complications and ways to prevent them  Explain how to manage diabetes during illness . Evaluate success in meeting personal goal . Establish 2-3 goals that they will plan to diligently work on until they return for the  8-month follow-up visit  Goals:   I will be active 30 minutes or more 5 times a week  I will eat less unhealthy fats by eating less junk food, fatty foods  Your patient has identified these potential barriers to change:  Motivation  Your patient has identified their diabetes self-care support plan as  On-line Resources Plan:  Attend Optional Core 4 in 4 months

## 2015-04-04 ENCOUNTER — Other Ambulatory Visit: Payer: Self-pay | Admitting: Cardiology

## 2015-04-20 ENCOUNTER — Other Ambulatory Visit: Payer: Self-pay | Admitting: Cardiology

## 2015-04-21 ENCOUNTER — Other Ambulatory Visit: Payer: Self-pay | Admitting: Cardiology

## 2015-04-23 ENCOUNTER — Other Ambulatory Visit: Payer: Self-pay

## 2015-04-29 ENCOUNTER — Other Ambulatory Visit: Payer: Self-pay | Admitting: Family Medicine

## 2015-04-29 DIAGNOSIS — N644 Mastodynia: Secondary | ICD-10-CM

## 2015-05-04 ENCOUNTER — Other Ambulatory Visit: Payer: Medicare PPO

## 2015-05-05 ENCOUNTER — Ambulatory Visit
Admission: RE | Admit: 2015-05-05 | Discharge: 2015-05-05 | Disposition: A | Discharge status: Home / Self Care | Payer: Medicare PPO | Source: Ambulatory Visit | Attending: Family Medicine | Admitting: Family Medicine

## 2015-05-05 DIAGNOSIS — N644 Mastodynia: Secondary | ICD-10-CM

## 2015-06-01 ENCOUNTER — Telehealth: Payer: Self-pay | Admitting: Cardiology

## 2015-06-01 NOTE — Telephone Encounter (Signed)
New message      Pt want to know how many milligrams of krill oil she should take a day?  Fish oil makes her burp but krill oil works best.  Please call

## 2015-06-02 NOTE — Telephone Encounter (Signed)
Since all of the OTC krill oil and fish oils are different, there is no set dose to take.  The prescription version of fish oil contains 465mg  of EPA and 375mg  on DHA in each capsule.  Goal is to take 4 of those capsules /day (total 1860mg  EPA and 1500mg  DHA per day).  I would choose the version that gets her closest to this amount.  She will not be able to get it exact as krill oil does not have the same ratio of EPA:DHA as fish oil.

## 2015-06-02 NOTE — Telephone Encounter (Signed)
Instructed patient to choose version of fish oil that gets her closest to 1860 mg EPA and 1500 mg DHA. Patient agrees with treatment plan.  Patient scheduled lab appointment January 5 with operator. What labs would you like?

## 2015-06-11 ENCOUNTER — Telehealth: Payer: Self-pay | Admitting: *Deleted

## 2015-06-11 NOTE — Telephone Encounter (Signed)
Pt states she needs to speak to someone about her orthotics.

## 2015-06-16 NOTE — Telephone Encounter (Signed)
Returned patients call she stated she called wrong office.

## 2015-07-02 ENCOUNTER — Encounter: Payer: Self-pay | Admitting: *Deleted

## 2015-07-02 ENCOUNTER — Encounter: Payer: Medicare PPO | Attending: Family Medicine | Admitting: *Deleted

## 2015-07-02 VITALS — Ht 67.0 in | Wt 234.0 lb

## 2015-07-02 DIAGNOSIS — E119 Type 2 diabetes mellitus without complications: Secondary | ICD-10-CM | POA: Diagnosis present

## 2015-07-02 NOTE — Progress Notes (Signed)
Diabetes Self-Management Education  Visit Type:  Follow-up  Appt. Start Time: 1100 Appt. End Time: 1130  07/02/2015  [name], identified by name and date of birth, is a 64 y.o. female with a diagnosis of Diabetes:  .   ASSESSMENT  Height 5\' 7"  (1.702 m), weight 234 lb (106.142 kg). Body mass index is 36.64 kg/(m^2).       Diabetes Self-Management Education - [id number]    Complications   Last HgB A1C per patient/outside source 6.9 %  increase from 6.7 taken 3 months ago   How often do you check your blood sugar? Not recommended by provider   Dietary Intake   Breakfast  Kashi or honey nut cheerios or Raisin bran cereal with    Lunch salad with grilled meat OR left overs    Dinner meat, starch and vegetables   Snack (evening) occasional   Beverage(s) coffee, water,    Exercise   Exercise Type ADL's   How many days per week to you exercise? 0   How many minutes per day do you exercise? 0   Total minutes per week of exercise 0   Patient Education   Previous Diabetes Education Yes (please comment)  Core Classes 3 months ago   Disease state  Definition of diabetes, type 1 and 2, and the diagnosis of diabetes   Nutrition management  Role of diet in the treatment of diabetes and the relationship between the three main macronutrients and blood glucose level;Food label reading, portion sizes and measuring food.;Carbohydrate counting;Meal options for control of blood glucose level and chronic complications.   Medications Reviewed patients medication for diabetes, action, purpose, timing of dose and side effects.   Monitoring Purpose and frequency of SMBG.;Identified appropriate SMBG and/or A1C goals.   Chronic complications Relationship between chronic complications and blood glucose control   Individualized Goals (developed by patient)   Nutrition Follow meal plan discussed   Monitoring  Other (comment)  Ask MD for Rx for meter and strips   Patient Self-Evaluation of  Goals - Patient rates self as meeting previously set goals (% of time)   Nutrition 50 - 75 %   Physical Activity < 25%   Medications >75%   Monitoring Not Applicable   Problem Solving 50 - 75 %   Reducing Risk >75%   Outcomes   Program Status Not Completed      Learning Objective:  Patient will have a greater understanding of diabetes self-management. Patient education plan is to attend individual and/or group sessions per assessed needs and concerns.   Plan:   There are no Patient Instructions on file for this visit.   Expected Outcomes:  Demonstrated interest in learning. Expect positive outcomes  Education material provided: Carbohydrate counting sheet, Menu Planner  If problems or questions, patient to contact team via:  Phone and Email  Future DSME appointment: - 4-6 wks

## 2015-07-23 ENCOUNTER — Other Ambulatory Visit: Payer: Self-pay | Admitting: Cardiology

## 2015-08-05 ENCOUNTER — Other Ambulatory Visit (INDEPENDENT_AMBULATORY_CARE_PROVIDER_SITE_OTHER): Payer: Medicare Other | Admitting: *Deleted

## 2015-08-05 DIAGNOSIS — I10 Essential (primary) hypertension: Secondary | ICD-10-CM

## 2015-08-05 DIAGNOSIS — I2510 Atherosclerotic heart disease of native coronary artery without angina pectoris: Secondary | ICD-10-CM

## 2015-08-05 DIAGNOSIS — E7800 Pure hypercholesterolemia, unspecified: Secondary | ICD-10-CM | POA: Diagnosis not present

## 2015-08-05 NOTE — Addendum Note (Signed)
Addended by: Eulis Foster on: 08/05/2015 07:55 AM   Modules accepted: Orders

## 2015-08-05 NOTE — Addendum Note (Signed)
Addended by: Eulis Foster on: 08/05/2015 11:48 AM   Modules accepted: Orders

## 2015-08-05 NOTE — Addendum Note (Signed)
Addended by: Eulis Foster on: 08/05/2015 07:54 AM   Modules accepted: Orders

## 2015-08-06 LAB — LIPID PANEL
Cholesterol: 173 mg/dL (ref 125–200)
HDL: 44 mg/dL — ABNORMAL LOW (ref >=46)
LDL CALC: 95 mg/dL (ref <130)
TRIGLYCERIDES: 169 mg/dL — AB (ref <150)
Total CHOL/HDL Ratio: 3.9 Ratio (ref <=5.0)
VLDL: 34 mg/dL — AB (ref <30)

## 2015-08-06 LAB — ALT: ALT: 46 U/L — ABNORMAL HIGH (ref 6–29)

## 2015-08-09 ENCOUNTER — Ambulatory Visit (INDEPENDENT_AMBULATORY_CARE_PROVIDER_SITE_OTHER): Payer: Medicare Other | Admitting: Cardiology

## 2015-08-09 ENCOUNTER — Encounter: Payer: Self-pay | Admitting: Cardiology

## 2015-08-09 VITALS — BP 128/68 | HR 81 | Ht 67.0 in | Wt 237.0 lb

## 2015-08-09 DIAGNOSIS — G4733 Obstructive sleep apnea (adult) (pediatric): Secondary | ICD-10-CM | POA: Diagnosis not present

## 2015-08-09 DIAGNOSIS — I2510 Atherosclerotic heart disease of native coronary artery without angina pectoris: Secondary | ICD-10-CM | POA: Diagnosis not present

## 2015-08-09 DIAGNOSIS — I10 Essential (primary) hypertension: Secondary | ICD-10-CM

## 2015-08-09 DIAGNOSIS — E7800 Pure hypercholesterolemia, unspecified: Secondary | ICD-10-CM | POA: Diagnosis not present

## 2015-08-09 NOTE — Patient Instructions (Signed)

## 2015-08-09 NOTE — Progress Notes (Signed)
Cardiology Office Note   Date:  08/09/2015   ID:  [name], DOB [birth year], MRN [id number]  PCP:  Tawanna Solo, MD    Chief Complaint  Patient presents with  . Coronary Artery Disease  . Sleep Apnea  . Hypertension      History of Present Illness: [name] is a 65 y.o. female with a history of nonobstructive ASCAD with 20% OM, HTN, dyslipidemia, and OSA on CPAP who presents today for followup. She is doing well. She had some pain under her left breast and was found to have cystic breast disease.  She says it is an ache and prickling sensation over her left nipple.  She has seen her PCP for this.  She denies any exertional chest pain, dizziness, palpitations or syncope.  Last nuclear stress test showed normal LVF with no ischemia.  She is trying to walk a mile a day and uses a reclining bike.   She tolerates her CPAP well. She tolerates the nasal mask and feels the pressure is adequate. She sleeps well all night. When using her CPAP she feels rested in the am. She occasionally she will fall asleep at the movies at night if she has just eaten.     Past Medical History  Diagnosis Date  . Hypertension   . GERD (gastroesophageal reflux disease)   . Arthritis   . Depression   . High cholesterol   . H/O hiatal hernia   . Kidney stones   . Sleep apnea     CPAP had Sleep Study ordered by Dr. Radford Pax  . Vasovagal syncope   . Obesity   . Diastolic dysfunction     w Elevated LVEDP  . Right knee pain 12/2009    Dr Marlou Sa  . Chronic fatigue     and weakness  . Iron deficiency anemia   . Multiple thyroid nodules   . Hypoglycemia   . Neurocardiogenic syncope   . DJD (degenerative joint disease)     C3/4 Dr Sherwood Gambler  . Prediabetes 2011 & 2012  . Coronary artery disease     nonobstructive with 20% OM by cath 2006  . Diabetes mellitus without complication [facility])     Past Surgical History  Procedure Laterality Date  . Anterior fusion  cervical spine      x2-cervical  . Carpal tunnel release      L side  . Tonsillectomy    . Total knee arthroplasty  11/2010  . Total knee arthroplasty  11/2011    left  . Lithotripsy    . Total knee arthroplasty  12/13/2011    Procedure: TOTAL KNEE ARTHROPLASTY;  Surgeon: Ninetta Lights, MD;  Location: [place];  Service: Orthopedics;  Laterality: Left;  DR MURPHY WANTS 90 MINUTES FOR THIS CASE  . Breast cyst excision      left  . Joint replacement  2012    R total knee  . Joint replacement  2013    lt total knee  . Achilles tendon surgery Left 01/23/2013    Procedure: EXCISION PARTIAL BONE TALUS/CALCANEUS, REPAIR RUPTURE ACHILLES TENDON PRIMARY OPEN ;  Surgeon: Ninetta Lights, MD;  Location: [place];  Service: Orthopedics;  Laterality: Left;  . Cardiac catheterization  ~ 2008    Nonobstructive ASCAD, 20% OM     Current Outpatient Prescriptions  Medication Sig Dispense Refill  . ALPRAZolam Duanne Moron)  1 MG tablet Take 1 mg by mouth at bedtime.  5  . aspirin 81 MG tablet Take 81 mg by mouth daily.    Marland Kitchen desvenlafaxine (PRISTIQ) 50 MG 24 hr tablet Take 50 mg by mouth daily.    Marland Kitchen esomeprazole (NEXIUM) 40 MG capsule Take 40 mg by mouth daily before breakfast.    . KRILL OIL PO Take 4 capsules by mouth daily.    Marland Kitchen lisinopril (PRINIVIL,ZESTRIL) 5 MG tablet TAKE 1 TABLET BY MOUTH EVERY DAY 30 tablet 5  . metFORMIN (GLUCOPHAGE) 500 MG tablet Take by mouth 2 (two) times daily with a meal.    . simvastatin (ZOCOR) 40 MG tablet Take 1 tablet (40 mg total) by mouth at bedtime. 30 tablet 11  . ZETIA 10 MG tablet TAKE 1 TABLET (10 MG TOTAL) BY MOUTH DAILY. 30 tablet 1  . zolpidem (AMBIEN) 10 MG tablet Take 10 mg by mouth at bedtime.     No current facility-administered medications for this visit.    Allergies:   Guaifenesin & derivatives; Morphine and related; Penicillins; Tramadol; Hydrocodone-acetaminophen; and Oxycodone hcl    Social History:  The patient  reports that she has  never smoked. She has never used smokeless tobacco. She reports that she drinks about 3.6 oz of alcohol per week. She reports that she does not use illicit drugs.   Family History:  The patient's family history includes CAD in her father; CVA in her mother; Cancer - Prostate in her father; Heart disease in her father; Hypertension in her sister.    ROS:  Please see the history of present illness.   Otherwise, review of systems are positive for none.   All other systems are reviewed and negative.    PHYSICAL EXAM: VS:  BP 128/68 mmHg  Pulse 81  Ht 5\' 7"  (1.702 m)  Wt 237 lb (107.502 kg)  BMI 37.11 kg/m2 , BMI Body mass index is 37.11 kg/(m^2). GEN: Well nourished, well developed, in no acute distress HEENT: normal Neck: no JVD, carotid bruits, or masses Cardiac: RRR; no murmurs, rubs, or gallops,no edema  Respiratory:  clear to auscultation bilaterally, normal work of breathing GI: soft, nontender, nondistended, + BS MS: no deformity or atrophy Skin: warm and dry, no rash Neuro:  Strength and sensation are intact Psych: euthymic mood, full affect   EKG:  EKG is not ordered today.    Recent Labs: 02/12/2015: Pro B Natriuretic peptide (BNP) 10.0 08/05/2015: ALT 46*    Lipid Panel    Component Value Date/Time   CHOL 173 08/05/2015 1149   TRIG 169* 08/05/2015 1149   HDL 44* 08/05/2015 1149   CHOLHDL 3.9 08/05/2015 1149   VLDL 34* 08/05/2015 1149   LDLCALC 95 08/05/2015 1149   LDLDIRECT 93.3 09/17/2013 1307      Wt Readings from Last 3 Encounters:  08/09/15 237 lb (107.502 kg)  07/02/15 234 lb (106.142 kg)  03/09/15 236 lb 8 oz (107.276 kg)     ASSESSMENT AND PLAN:  1. Nonobstructive ASCAD - continue ASA /statin  2. Dyslipidemia - her LDL is 95 and should be less than 70 for CAD with HTN. She has not been exercising and cannot tolerate a higher dose of statin. She has not tolerated Lipitor or crestor in the past.  She will continue to try to follow a low fat  low carb diet. - continue Zetia/Welchol/Zocor - not a candidate at this time for PCSK9 3. HTN - controlled - continue Lisinopril  4.  OSA on CPAP - her d/l today showed an AHI of 1.6/hr on 12cm H2O. 5. Exertional SOB - echo showed normal LVF and normal BNP with no ischemia no nuclear stress test this past summer. This is related to obesity.  It has improved after she started to exercise. 6. Obesity - I have encouraged her to get into an exercise program and watch her portions of food   Current medicines are reviewed at length with the patient today.  The patient has no concerns regarding medicines.  The following changes have been made:  no change  Labs/ tests ordered today: See above Assessment and Plan No orders of the defined types were placed in this encounter.     Disposition:   FU with me in 1 year  Signed, [name], MD  08/09/2015 1:54 PM    [facility] [address], [place], [place]  [address] Phone: [phone]; Fax: [phone]   [name] is a 65 y.o. female with a history of nonobstructive ASCAD with 20% OM, HTN, dyslipidemia, and OSA on CPAP who presents today for followup. She is doing well. She denies any chest pain, dizziness, palpitations or syncope. She has noticed some SOB but has gained 8 lbs. The SOB is enough to affect her ability to do some housework. She tolerates her CPAP well. She tolerates the nasal mask and feels the pressure is adequate. She sleeps well all night. When using her CPAP she feels rested in the am. She occasionally she will fall asleep at the movies at night if she has just eaten.

## 2015-08-18 ENCOUNTER — Encounter: Payer: Self-pay | Admitting: Cardiology

## 2015-09-30 ENCOUNTER — Other Ambulatory Visit: Payer: Self-pay | Admitting: Cardiology

## 2015-10-27 ENCOUNTER — Other Ambulatory Visit: Payer: Self-pay | Admitting: Family Medicine

## 2015-10-27 DIAGNOSIS — N6320 Unspecified lump in the left breast, unspecified quadrant: Secondary | ICD-10-CM

## 2015-11-04 ENCOUNTER — Ambulatory Visit
Admission: RE | Admit: 2015-11-04 | Discharge: 2015-11-04 | Disposition: A | Discharge status: Home / Self Care | Payer: Medicare Other | Source: Ambulatory Visit | Attending: Family Medicine | Admitting: Family Medicine

## 2015-11-04 DIAGNOSIS — N6320 Unspecified lump in the left breast, unspecified quadrant: Secondary | ICD-10-CM

## 2016-01-20 ENCOUNTER — Emergency Department (HOSPITAL_COMMUNITY)
Admission: EM | Admit: 2016-01-20 | Discharge: 2016-01-20 | Disposition: A | Discharge status: Home / Self Care | Payer: Medicare Other | Attending: Emergency Medicine | Admitting: Emergency Medicine

## 2016-01-20 ENCOUNTER — Other Ambulatory Visit: Payer: Self-pay

## 2016-01-20 ENCOUNTER — Encounter (HOSPITAL_COMMUNITY): Payer: Self-pay | Admitting: Emergency Medicine

## 2016-01-20 ENCOUNTER — Emergency Department (HOSPITAL_COMMUNITY): Payer: Medicare Other

## 2016-01-20 DIAGNOSIS — R079 Chest pain, unspecified: Secondary | ICD-10-CM

## 2016-01-20 DIAGNOSIS — I2510 Atherosclerotic heart disease of native coronary artery without angina pectoris: Secondary | ICD-10-CM | POA: Diagnosis not present

## 2016-01-20 DIAGNOSIS — I10 Essential (primary) hypertension: Secondary | ICD-10-CM | POA: Insufficient documentation

## 2016-01-20 DIAGNOSIS — E119 Type 2 diabetes mellitus without complications: Secondary | ICD-10-CM | POA: Diagnosis not present

## 2016-01-20 DIAGNOSIS — R0789 Other chest pain: Secondary | ICD-10-CM | POA: Insufficient documentation

## 2016-01-20 DIAGNOSIS — Z79899 Other long term (current) drug therapy: Secondary | ICD-10-CM | POA: Insufficient documentation

## 2016-01-20 DIAGNOSIS — Z7982 Long term (current) use of aspirin: Secondary | ICD-10-CM | POA: Insufficient documentation

## 2016-01-20 DIAGNOSIS — Z96651 Presence of right artificial knee joint: Secondary | ICD-10-CM | POA: Insufficient documentation

## 2016-01-20 DIAGNOSIS — Z96652 Presence of left artificial knee joint: Secondary | ICD-10-CM | POA: Insufficient documentation

## 2016-01-20 DIAGNOSIS — R072 Precordial pain: Secondary | ICD-10-CM | POA: Diagnosis present

## 2016-01-20 DIAGNOSIS — Z7984 Long term (current) use of oral hypoglycemic drugs: Secondary | ICD-10-CM | POA: Insufficient documentation

## 2016-01-20 LAB — CBC
HEMATOCRIT: 40.2 % (ref 36.0–46.0)
HEMOGLOBIN: 12.5 g/dL (ref 12.0–15.0)
MCH: 25 pg — AB (ref 26.0–34.0)
MCHC: 31.1 g/dL (ref 30.0–36.0)
MCV: 80.4 fL (ref 78.0–100.0)
Platelets: 247 10*3/uL (ref 150–400)
RBC: 5 MIL/uL (ref 3.87–5.11)
RDW: 15.6 % — ABNORMAL HIGH (ref 11.5–15.5)
WBC: 5.6 10*3/uL (ref 4.0–10.5)

## 2016-01-20 LAB — COMPREHENSIVE METABOLIC PANEL
ALBUMIN: 3.9 g/dL (ref 3.5–5.0)
ALK PHOS: 75 U/L (ref 38–126)
ALT: 103 U/L — AB (ref 14–54)
AST: 85 U/L — AB (ref 15–41)
Anion gap: 10 (ref 5–15)
BILIRUBIN TOTAL: 0.6 mg/dL (ref 0.3–1.2)
BUN: 12 mg/dL (ref 6–20)
CALCIUM: 9.8 mg/dL (ref 8.9–10.3)
CO2: 22 mmol/L (ref 22–32)
Chloride: 106 mmol/L (ref 101–111)
Creatinine, Ser: 0.84 mg/dL (ref 0.44–1.00)
GFR calc Af Amer: >60 mL/min (ref >60)
GFR calc non Af Amer: >60 mL/min (ref >60)
GLUCOSE: 138 mg/dL — AB (ref 65–99)
Potassium: 3.5 mmol/L (ref 3.5–5.1)
Sodium: 138 mmol/L (ref 135–145)
TOTAL PROTEIN: 7.3 g/dL (ref 6.5–8.1)

## 2016-01-20 LAB — I-STAT TROPONIN, ED
Troponin i, poc: 0 ng/mL (ref 0.00–0.08)
Troponin i, poc: 0 ng/mL (ref 0.00–0.08)

## 2016-01-20 MED ORDER — GI COCKTAIL ~~LOC~~
30.0000 mL | Freq: Once | ORAL | Status: AC
  Administered 2016-01-20: 30 mL via ORAL
  Filled 2016-01-20: qty 30

## 2016-01-20 MED ORDER — ONDANSETRON 4 MG PO TBDP
8.0000 mg | ORAL_TABLET | Freq: Once | ORAL | Status: AC
  Administered 2016-01-20: 8 mg via ORAL
  Filled 2016-01-20: qty 2

## 2016-01-20 NOTE — ED Notes (Addendum)
Cp that was on and off since 11pm has also had some nausea and vomited, feel like her heart is pounding had some rt sided abd pain, also states that she took an old  Pain med and she knew she had a reaction to it in the past , she did have some itching

## 2016-01-20 NOTE — ED Provider Notes (Signed)
CSN: [id number]     Arrival date & time 01/20/16  [id number] History   First MD Initiated Contact with Patient 01/20/16 [phone]     Chief Complaint  Patient presents with  . Nausea  . Chest Pain     (Consider location/radiation/quality/duration/timing/severity/associated sxs/prior Treatment) HPI Comments: Patient is a 65 year old female with history of hypertension, diabetes. She presents for evaluation of chest discomfort. She reports having some pain in her neck and shoulder over the past several days. This is not atypical for her to have this as she has had neck surgery in the past and has pain that flares up from time to time. He was feeling worse yesterday evening, so she took a hydrocodone pill she had at home. She reports that this pill was old from a prior prescription and believes it may have upset her stomach. She is reporting some nausea and burning in her chest. She denies any shortness of breath or radiation to the arm or jaw. She denies any recent exertional symptoms.  She called her primary Dr. who instructed her to come to the ER to be evaluated. She reports to me that she has had a normal stress test within the past 6 months.  Patient is a 65 y.o. female presenting with chest pain. The history is provided by the patient.  Chest Pain Pain location:  Substernal area Pain quality: burning   Pain radiates to:  Does not radiate Pain radiates to the back: no   Pain severity:  Moderate Onset quality:  Sudden Duration:  10 hours Timing:  Constant Progression:  Worsening Chronicity:  New Relieved by:  Nothing Worsened by:  Nothing tried Ineffective treatments:  None tried   Past Medical History  Diagnosis Date  . Hypertension   . GERD (gastroesophageal reflux disease)   . Arthritis   . Depression   . High cholesterol   . H/O hiatal hernia   . Kidney stones   . Sleep apnea     CPAP had Sleep Study ordered by Dr. Radford Pax  . Vasovagal syncope   . Obesity   . Diastolic  dysfunction     w Elevated LVEDP  . Right knee pain 12/2009    Dr Marlou Sa  . Chronic fatigue     and weakness  . Iron deficiency anemia   . Multiple thyroid nodules   . Hypoglycemia   . Neurocardiogenic syncope   . DJD (degenerative joint disease)     C3/4 Dr Sherwood Gambler  . Prediabetes 2011 & 2012  . Coronary artery disease     nonobstructive with 20% OM by cath 2006  . Diabetes mellitus without complication [facility])    Past Surgical History  Procedure Laterality Date  . Anterior fusion cervical spine      x2-cervical  . Carpal tunnel release      L side  . Tonsillectomy    . Total knee arthroplasty  11/2010  . Total knee arthroplasty  11/2011    left  . Lithotripsy    . Total knee arthroplasty  12/13/2011    Procedure: TOTAL KNEE ARTHROPLASTY;  Surgeon: Ninetta Lights, MD;  Location: [place];  Service: Orthopedics;  Laterality: Left;  DR MURPHY WANTS 90 MINUTES FOR THIS CASE  . Breast cyst excision      left  . Joint replacement  2012    R total knee  . Joint replacement  2013    lt total knee  . Achilles tendon surgery Left 01/23/2013  Procedure: EXCISION PARTIAL BONE TALUS/CALCANEUS, REPAIR RUPTURE ACHILLES TENDON PRIMARY OPEN ;  Surgeon: Ninetta Lights, MD;  Location: [place];  Service: Orthopedics;  Laterality: Left;  . Cardiac catheterization  ~ 2008    Nonobstructive ASCAD, 20% OM   Family History  Problem Relation Age of Onset  . CAD Father   . Cancer - Prostate Father   . Heart disease Father   . Hypertension Sister   . CVA Mother    Social History  Substance Use Topics  . Smoking status: Never Smoker   . Smokeless tobacco: Never Used  . Alcohol Use: 3.6 oz/week    6 Glasses of wine per week     Comment: couple times per week.   OB History    No data available     Review of Systems  Cardiovascular: Positive for chest pain.  All other systems reviewed and are negative.     Allergies  Guaifenesin & derivatives; Morphine and related;  Penicillins; Tramadol; Hydrocodone-acetaminophen; and Oxycodone hcl  Home Medications   Prior to Admission medications   Medication Sig Start Date End Date Taking? Authorizing Provider  ALPRAZolam Duanne Moron) 1 MG tablet Take 1 mg by mouth at bedtime. 12/21/14   Historical Provider, MD  aspirin 81 MG tablet Take 81 mg by mouth daily.    Historical Provider, MD  desvenlafaxine (PRISTIQ) 50 MG 24 hr tablet Take 50 mg by mouth daily.    Historical Provider, MD  esomeprazole (NEXIUM) 40 MG capsule Take 40 mg by mouth daily before breakfast.    Historical Provider, MD  KRILL OIL PO Take 4 capsules by mouth daily.    Historical Provider, MD  lisinopril (PRINIVIL,ZESTRIL) 5 MG tablet TAKE 1 TABLET BY MOUTH EVERY DAY 09/30/15   [name], MD  metFORMIN (GLUCOPHAGE) 500 MG tablet Take by mouth 2 (two) times daily with a meal.    Historical Provider, MD  simvastatin (ZOCOR) 40 MG tablet Take 1 tablet (40 mg total) by mouth at bedtime. 02/15/15   [name], MD  ZETIA 10 MG tablet TAKE 1 TABLET (10 MG TOTAL) BY MOUTH DAILY. 11/16/14   [name], MD  zolpidem (AMBIEN) 10 MG tablet Take 10 mg by mouth at bedtime.    Historical Provider, MD   BP 127/74 mmHg  Pulse 79  Temp(Src) 98.9 F (37.2 C) (Oral)  Resp 13  SpO2 94% Physical Exam  Constitutional: She is oriented to person, place, and time. She appears well-developed and well-nourished. No distress.  HENT:  Head: Normocephalic and atraumatic.  Neck: Normal range of motion. Neck supple.  Cardiovascular: Normal rate and regular rhythm.  Exam reveals no gallop and no friction rub.   No murmur heard. Pulmonary/Chest: Effort normal and breath sounds normal. No respiratory distress. She has no wheezes. She has no rales.  Abdominal: Soft. Bowel sounds are normal. She exhibits no distension. There is no tenderness.  Musculoskeletal: Normal range of motion. She exhibits no edema.  Neurological: She is alert and oriented to person, place, and time.   Skin: Skin is warm and dry. She is not diaphoretic.  Nursing note and vitals reviewed.   ED Course  Procedures (including critical care time) Labs Review Labs Reviewed  CBC - Abnormal; Notable for the following:    MCH 25.0 (*)    RDW 15.6 (*)    All other components within normal limits  COMPREHENSIVE METABOLIC PANEL - Abnormal; Notable for the following:    Glucose,  Bld 138 (*)    AST 85 (*)    ALT 103 (*)    All other components within normal limits  Randolm Idol, ED    Imaging Review Dg Chest 2 View  01/20/2016  CLINICAL DATA:  Chest pain. EXAM: CHEST  2 VIEW COMPARISON:  Radiograph of October 30, 2014. FINDINGS: The heart size and mediastinal contours are within normal limits. Both lungs are clear. No pneumothorax or pleural effusion is noted. The visualized skeletal structures are unremarkable. IMPRESSION: No active cardiopulmonary disease. Electronically Signed   By: Marijo Conception, M.D.   On: 01/20/2016 09:10   I have personally reviewed and evaluated these images and lab results as part of my medical decision-making.   EKG Interpretation   Date/Time:  Thursday January 20 2016 08:36:26 EDT Ventricular Rate:  84 PR Interval:  138 QRS Duration: 82 QT Interval:  380 QTC Calculation: 449 R Axis:   77 Text Interpretation:  Normal sinus rhythm Normal ECG Confirmed by Suhaan Perleberg   MD, Gracia Saggese ([id number]) on 01/20/2016 12:01:29 PM      MDM   Final diagnoses:  None    Patient presents with complaints of chest discomfort that started yesterday evening after taking a hydrocodone tablet for her chronic neck pain. She has not taken one of these pills in quite some time in their from an old prescription. There is nothing here on her workup that would indicate a cardiac etiology. EKG is normal and troponin 2 is negative. She is feeling better after Zofran and I believe is appropriate for discharge.  There is no evidence for a cardiac etiology. She had a normal stress test less than 6  months ago. She does understand to return if symptoms significantly worsen or change.    Veryl Speak, MD 01/20/16 1204

## 2016-01-20 NOTE — ED Notes (Signed)
Pt denies concerns with discharge. 

## 2016-01-20 NOTE — ED Notes (Signed)
PT reports she took a 10mg  hydrocodone last night at 2320 for neck pain. Nausea started about an hour later. PT reports chest tightness began after nausea.

## 2016-01-20 NOTE — Discharge Instructions (Signed)
Continue taking omeprazole 20 mg twice daily for the next week. Be sure to take all of your medications with food.  Return to the emergency department if your symptoms significant only worsen or change.   Nonspecific Chest Pain  Chest pain can be caused by many different conditions. There is always a chance that your pain could be related to something serious, such as a heart attack or a blood clot in your lungs. Chest pain can also be caused by conditions that are not life-threatening. If you have chest pain, it is very important to follow up with your health care provider. CAUSES  Chest pain can be caused by:  Heartburn.  Pneumonia or bronchitis.  Anxiety or stress.  Inflammation around your heart (pericarditis) or lung (pleuritis or pleurisy).  A blood clot in your lung.  A collapsed lung (pneumothorax). It can develop suddenly on its own (spontaneous pneumothorax) or from trauma to the chest.  Shingles infection (varicella-zoster virus).  Heart attack.  Damage to the bones, muscles, and cartilage that make up your chest wall. This can include:  Bruised bones due to injury.  Strained muscles or cartilage due to frequent or repeated coughing or overwork.  Fracture to one or more ribs.  Sore cartilage due to inflammation (costochondritis). RISK FACTORS  Risk factors for chest pain may include:  Activities that increase your risk for trauma or injury to your chest.  Respiratory infections or conditions that cause frequent coughing.  Medical conditions or overeating that can cause heartburn.  Heart disease or family history of heart disease.  Conditions or health behaviors that increase your risk of developing a blood clot.  Having had chicken pox (varicella zoster). SIGNS AND SYMPTOMS Chest pain can feel like:  Burning or tingling on the surface of your chest or deep in your chest.  Crushing, pressure, aching, or squeezing pain.  Dull or sharp pain that is  worse when you move, cough, or take a deep breath.  Pain that is also felt in your back, neck, shoulder, or arm, or pain that spreads to any of these areas. Your chest pain may come and go, or it may stay constant. DIAGNOSIS Lab tests or other studies may be needed to find the cause of your pain. Your health care provider may have you take a test called an ambulatory ECG (electrocardiogram). An ECG records your heartbeat patterns at the time the test is performed. You may also have other tests, such as:  Transthoracic echocardiogram (TTE). During echocardiography, sound waves are used to create a picture of all of the heart structures and to look at how blood flows through your heart.  Transesophageal echocardiogram (TEE).This is a more advanced imaging test that obtains images from inside your body. It allows your health care provider to see your heart in finer detail.  Cardiac monitoring. This allows your health care provider to monitor your heart rate and rhythm in real time.  Holter monitor. This is a portable device that records your heartbeat and can help to diagnose abnormal heartbeats. It allows your health care provider to track your heart activity for several days, if needed.  Stress tests. These can be done through exercise or by taking medicine that makes your heart beat more quickly.  Blood tests.  Imaging tests. TREATMENT  Your treatment depends on what is causing your chest pain. Treatment may include:  Medicines. These may include:  Acid blockers for heartburn.  Anti-inflammatory medicine.  Pain medicine for inflammatory conditions.  Antibiotic  medicine, if an infection is present.  Medicines to dissolve blood clots.  Medicines to treat coronary artery disease.  Supportive care for conditions that do not require medicines. This may include:  Resting.  Applying heat or cold packs to injured areas.  Limiting activities until pain decreases. HOME CARE  INSTRUCTIONS  If you were prescribed an antibiotic medicine, finish it all even if you start to feel better.  Avoid any activities that bring on chest pain.  Do not use any tobacco products, including cigarettes, chewing tobacco, or electronic cigarettes. If you need help quitting, ask your health care provider.  Do not drink alcohol.  Take medicines only as directed by your health care provider.  Keep all follow-up visits as directed by your health care provider. This is important. This includes any further testing if your chest pain does not go away.  If heartburn is the cause for your chest pain, you may be told to keep your head raised (elevated) while sleeping. This reduces the chance that acid will go from your stomach into your esophagus.  Make lifestyle changes as directed by your health care provider. These may include:  Getting regular exercise. Ask your health care provider to suggest some activities that are safe for you.  Eating a heart-healthy diet. A registered dietitian can help you to learn healthy eating options.  Maintaining a healthy weight.  Managing diabetes, if necessary.  Reducing stress. SEEK MEDICAL CARE IF:  Your chest pain does not go away after treatment.  You have a rash with blisters on your chest.  You have a fever. SEEK IMMEDIATE MEDICAL CARE IF:   Your chest pain is worse.  You have an increasing cough, or you cough up blood.  You have severe abdominal pain.  You have severe weakness.  You faint.  You have chills.  You have sudden, unexplained chest discomfort.  You have sudden, unexplained discomfort in your arms, back, neck, or jaw.  You have shortness of breath at any time.  You suddenly start to sweat, or your skin gets clammy.  You feel nauseous or you vomit.  You suddenly feel light-headed or dizzy.  Your heart begins to beat quickly, or it feels like it is skipping beats. These symptoms may represent a serious  problem that is an emergency. Do not wait to see if the symptoms will go away. Get medical help right away. Call your local emergency services (911 in the [place] Do not drive yourself to the hospital.   This information is not intended to replace advice given to you by your health care provider. Make sure you discuss any questions you have with your health care provider.   Document Released: 04/26/2005 Document Revised: 08/07/2014 Document Reviewed: 02/20/2014 Elsevier Interactive Patient Education [employer].

## 2016-01-25 ENCOUNTER — Other Ambulatory Visit: Payer: Self-pay | Admitting: Cardiology

## 2016-03-02 ENCOUNTER — Ambulatory Visit (INDEPENDENT_AMBULATORY_CARE_PROVIDER_SITE_OTHER): Payer: Medicare Other | Admitting: Podiatry

## 2016-03-02 VITALS — BP 148/80 | HR 77 | Resp 14

## 2016-03-02 DIAGNOSIS — M722 Plantar fascial fibromatosis: Secondary | ICD-10-CM

## 2016-03-02 DIAGNOSIS — M205X9 Other deformities of toe(s) (acquired), unspecified foot: Secondary | ICD-10-CM

## 2016-03-02 MED ORDER — MELOXICAM 15 MG PO TABS: 15.0000 mg | ORAL_TABLET | Freq: Every day | ORAL | 0 refills | Status: DC

## 2016-03-02 NOTE — Progress Notes (Signed)
   Subjective:    Patient ID: [name], female    DOB: 31-Jan-1951, 65 y.o.   MRN: [id number]  HPI this patient presents to the office with chief complaint of pain in her left heel. She says she has pain noted in the left heel, as well as significant callus that is noted on the inside border of the left heel. She has previously been diagnosed with plantar fasciitis and has been treated with kinetic wedge orthotic. She says the pain in her left heel is increasing in severity. She also says she has painful callus noted on the inside of both big toes. These are thick with callus and painful as she walks. Patient now is diabetic with probable peripheral neuropathy  She presents to the office for evaluation and treatment.    Review of Systems  All other systems reviewed and are negative.      Objective:   Physical Exam GENERAL APPEARANCE: Alert, conversant. Appropriately groomed. No acute distress.  VASCULAR: Pedal pulses are  palpable at  [facility] and PT bilateral.  Capillary refill time is immediate to all digits,  Normal temperature gradient.   NEUROLOGIC: sensation is normal to 5.07 monofilament at 5/5 sites bilateral.  Light touch is intact bilateral, Muscle strength normal.  MUSCULOSKELETAL: acceptable muscle strength, tone and stability bilateral.  Intrinsic muscluature intact bilateral.  Functional hallux limitus 1st MPJ  B/L.  Pain throu arch left foot.    DERMATOLOGIC: skin color, texture, and turgor are within normal limits.  No preulcerative lesions or ulcers  are seen, no interdigital maceration noted.  No open lesions present.  Digital nails are asymptomatic. No drainage noted. Significant callus build-up medial aspect left heel.  Callus along the hallux  B/L.         Assessment & Plan:  Plantar fascitis,  Functional hallux limitus 1st MPJ  B/L Callus B/L  IE.  Debrided callus B/L. Examination of her former orthotics reveal they fit well to her feet.  The heel of the left  orthotic does have an additional 3/16 of an inch left. She says that it feels different than her right orthotic due to the heel left. She admits to having knee surgery on both knees as well as having hip problems.  Examination of her left shoe reveals significant breakdown on the lateral heel. We discussed her foot and decided for her to get a new pair of shoes. We also will send her orthotics back to have the heel lift remove. I will also ask for a deeper heel cup.  Once this orthotic is received she will be called and told to pick up a new orthotic .  Prescribed Mobic for her painful left foot. Return to clinic when called.  Gardiner Barefoot DPM

## 2016-03-06 ENCOUNTER — Telehealth: Payer: Self-pay | Admitting: *Deleted

## 2016-03-15 NOTE — Telephone Encounter (Signed)
error 

## 2016-03-29 ENCOUNTER — Other Ambulatory Visit: Payer: Self-pay | Admitting: Podiatry

## 2016-04-02 ENCOUNTER — Other Ambulatory Visit: Payer: Self-pay | Admitting: Cardiology

## 2016-04-25 ENCOUNTER — Other Ambulatory Visit: Payer: Self-pay | Admitting: Family Medicine

## 2016-04-25 DIAGNOSIS — Z1231 Encounter for screening mammogram for malignant neoplasm of breast: Secondary | ICD-10-CM

## 2016-05-11 ENCOUNTER — Ambulatory Visit: Payer: Medicare Other

## 2016-05-11 ENCOUNTER — Other Ambulatory Visit: Payer: Self-pay | Admitting: Family Medicine

## 2016-05-11 DIAGNOSIS — N644 Mastodynia: Secondary | ICD-10-CM

## 2016-05-17 ENCOUNTER — Other Ambulatory Visit: Payer: Medicare Other

## 2016-05-23 ENCOUNTER — Other Ambulatory Visit: Payer: Medicare Other

## 2016-05-30 ENCOUNTER — Ambulatory Visit
Admission: RE | Admit: 2016-05-30 | Discharge: 2016-05-30 | Disposition: A | Discharge status: Home / Self Care | Payer: Medicare Other | Source: Ambulatory Visit | Attending: Family Medicine | Admitting: Family Medicine

## 2016-05-30 DIAGNOSIS — N644 Mastodynia: Secondary | ICD-10-CM

## 2016-07-05 ENCOUNTER — Telehealth: Payer: Self-pay | Admitting: Cardiology

## 2016-07-05 DIAGNOSIS — I10 Essential (primary) hypertension: Secondary | ICD-10-CM

## 2016-07-05 DIAGNOSIS — E7800 Pure hypercholesterolemia, unspecified: Secondary | ICD-10-CM

## 2016-07-05 NOTE — Telephone Encounter (Signed)
New message  Pt wants to know if blood work needs to be done prior OV on 1/25  Please call pt and confirm, ok to leave message

## 2016-07-05 NOTE — Telephone Encounter (Signed)
Patient wanted to know if she need to have lab work done prior to her appointment with Dr. Radford Pax on 08/24/16. Looking through the note on 08/09/15 (see below) it did not indicate any labs. Will route to Dr. Radford Pax and her RN for further evaluation. I told the patient as of right now she does not need any labs but they would notify her if there is a change. Patient verbalized understanding and is in agreement.   Patient Instructions   Medication Instructions:  Your physician recommends that you continue on your current medications as directed. Please refer to the Current Medication list given to you today.   Labwork: None  Testing/Procedures: None  Follow-Up: Your physician wants you to follow-up in: 1 year with Dr. Radford Pax.  You will receive a reminder letter in the mail two months in advance. If you don't receive a letter, please call our office to schedule the follow-up appointment.

## 2016-07-06 NOTE — Telephone Encounter (Signed)
FLP, ALT and BMET 

## 2016-07-07 NOTE — Telephone Encounter (Signed)
BMET, FLP and ALT ordered and scheduled 08/22/16. Patient agrees with treatment plan and understands to come fasting.

## 2016-07-11 ENCOUNTER — Other Ambulatory Visit: Payer: Self-pay | Admitting: Cardiology

## 2016-07-12 ENCOUNTER — Other Ambulatory Visit: Payer: Self-pay | Admitting: Cardiology

## 2016-08-04 ENCOUNTER — Telehealth: Payer: Self-pay | Admitting: Cardiology

## 2016-08-04 NOTE — Telephone Encounter (Signed)
New Message    Does she have to keep lab appt for 1/23, she just had all her labs done through PCP

## 2016-08-04 NOTE — Telephone Encounter (Signed)
New Message   Does she need to have lab work done again on 1/23 , she just had labs at PCP for physical ?

## 2016-08-04 NOTE — Telephone Encounter (Signed)
Patient states she had her labs drawn earlier this week at her PCP. She states she knows she had her cholesterol drawn but isn't sure what else.  She is scheduled to have BMET and [place] drawn 1/23.  Instructed her to bring a copy of her labs to her appointment with Dr. Radford Pax 1/25, that as long as her FLP is there anything else not drawn could be taken at her appointment. [place] lab appointment 1/23. She was grateful for assistance and agrees with treatment plan.

## 2016-08-08 ENCOUNTER — Ambulatory Visit (INDEPENDENT_AMBULATORY_CARE_PROVIDER_SITE_OTHER): Payer: Medicare Other | Admitting: Podiatry

## 2016-08-08 ENCOUNTER — Ambulatory Visit: Payer: Medicare Other

## 2016-08-08 ENCOUNTER — Encounter: Payer: Self-pay | Admitting: Podiatry

## 2016-08-08 DIAGNOSIS — R234 Changes in skin texture: Secondary | ICD-10-CM

## 2016-08-08 DIAGNOSIS — R52 Pain, unspecified: Secondary | ICD-10-CM | POA: Diagnosis not present

## 2016-08-08 MED ORDER — AMMONIUM LACTATE 12 % EX CREA: TOPICAL_CREAM | CUTANEOUS | 0 refills | Status: DC | PRN

## 2016-08-08 NOTE — Progress Notes (Signed)
   Subjective:    Patient ID: [name], female    DOB: 1951-06-28, 66 y.o.   MRN: [id number]  HPI this patient presents to the office with chief complaint of pain in her right heel.  She says that she is starting to feel pain and discomfort as she walks over the last week. Patient states that she is diabetic and she feels that there skin is peeling. She also says there is trace localized area on the back of her right heel that is causing pain and discomfort. She is concerned about this worsening. He presents the office today for an evaluation and treatment of her right foot    Review of Systems  All other systems reviewed and are negative.      Objective:   Physical Exam GENERAL APPEARANCE: Alert, conversant. Appropriately groomed. No acute distress.  VASCULAR: Pedal pulses are  palpable at  [facility] and PT bilateral.  Capillary refill time is immediate to all digits,  Normal temperature gradient.   NEUROLOGIC: sensation is normal to 5.07 monofilament at 5/5 sites bilateral.  Light touch is intact bilateral, Muscle strength normal.  MUSCULOSKELETAL: acceptable muscle strength, tone and stability bilateral.  Intrinsic muscluature intact bilateral.  Functional hallux limitus 1st MPJ  B/L.  Pain throu arch left foot.    DERMATOLOGIC: skin color, texture, and turgor are within normal limits.  No preulcerative lesions or ulcers  are seen, no interdigital maceration noted.  No open lesions present.  Digital nails are asymptomatic. No drainage noted. Significant callus build-up medial aspect left heel.  Callus along the hallux  B/L. Severe peeling noted on her right foot with fissure formation noted in the right heel. There is also severe. Peeling noted on her left foot. No evidence of any infection or drainage noted from the fissure on her right heel         Assessment & Plan:   Dyshydrosis  B/L  ROV  Patient was instructed to use Vaseline on both feet. She was also given a prescription  for Lac-Hydrin to use on both feet. She was told about for an AFO would be beneficial for this peeling that she is experiencing. Discussed possible future infections to the fissure. Therefore, told her to use Chapstick at the site of the fissure. She is to return to the office if the problem worsens. She was also dispensed a heel lift, which was applied to her right orthotic to help improve her gait. Return to the office when necessary  Gardiner Barefoot DPM

## 2016-08-10 ENCOUNTER — Telehealth: Payer: Self-pay | Admitting: *Deleted

## 2016-08-10 NOTE — Telephone Encounter (Signed)
Pt left message asking how long the orthotics last and what to do if they squeak. 08/10/2016-I spoke with pt and she states she purchased orthotics from Dr. Prudence Davidson that he had made. I told pt as long as the hard form of the orthotics were intact, and there was no change to the foot or function then they would last about 5 years and in some cases can be recover for and continued to be used. Pt states the orthotics squeak in some of her shoes. I told pt to rub a bar of soap along the edges of the orthotics or put powder in her shoes. Pt states due to a knee replacement she has one leg longer than the other and limps, Dr. Prudence Davidson gave her felt pads, and the worked for awhile, is there anything that can be added to the orthotic that would be more permanent. I told pt that the felt pad would eventually compress down, and that if she wanted a more permanent lift, Dr. Prudence Davidson could measure the legs and have the needed addition added to her orthotic. Pt states she may have to have that done, I told her to call for an appt.

## 2016-08-22 ENCOUNTER — Other Ambulatory Visit: Payer: Medicare Other

## 2016-08-22 ENCOUNTER — Telehealth: Payer: Self-pay | Admitting: Cardiology

## 2016-08-22 ENCOUNTER — Other Ambulatory Visit: Payer: Medicare Other | Admitting: *Deleted

## 2016-08-22 DIAGNOSIS — E7800 Pure hypercholesterolemia, unspecified: Secondary | ICD-10-CM

## 2016-08-22 DIAGNOSIS — I2510 Atherosclerotic heart disease of native coronary artery without angina pectoris: Secondary | ICD-10-CM

## 2016-08-22 DIAGNOSIS — I10 Essential (primary) hypertension: Secondary | ICD-10-CM

## 2016-08-22 NOTE — Telephone Encounter (Signed)
° ° °  1/3 per pt she did lab work non fasting and will bring there results in.  Pt would like to know if she has to do the fasting lipid lab before her appt tomorrow.     Please give her a call back.

## 2016-08-22 NOTE — Telephone Encounter (Signed)
Left message for patient thanking her for having labs drawn today.  Informed her Dr. Radford Pax will review results at [facility] this Thursday. Instructed her to call back prior to appointment if she has any questions or concerns.

## 2016-08-23 LAB — BASIC METABOLIC PANEL
BUN/Creatinine Ratio: 19 (ref 12–28)
BUN: 14 mg/dL (ref 8–27)
CALCIUM: 9.4 mg/dL (ref 8.7–10.3)
CO2: 25 mmol/L (ref 18–29)
CREATININE: 0.75 mg/dL (ref 0.57–1.00)
Chloride: 102 mmol/L (ref 96–106)
GFR calc Af Amer: 96 mL/min/{1.73_m2} (ref >59)
GFR, EST NON AFRICAN AMERICAN: 83 mL/min/{1.73_m2} (ref >59)
Glucose: 129 mg/dL — ABNORMAL HIGH (ref 65–99)
Potassium: 4.3 mmol/L (ref 3.5–5.2)
Sodium: 141 mmol/L (ref 134–144)

## 2016-08-23 LAB — LIPID PANEL
CHOLESTEROL TOTAL: 158 mg/dL (ref 100–199)
Chol/HDL Ratio: 3.5 ratio units (ref 0.0–4.4)
HDL: 45 mg/dL (ref >39)
LDL CALC: 77 mg/dL (ref 0–99)
TRIGLYCERIDES: 179 mg/dL — AB (ref 0–149)
VLDL CHOLESTEROL CAL: 36 mg/dL (ref 5–40)

## 2016-08-23 LAB — ALT: ALT: 78 IU/L — ABNORMAL HIGH (ref 0–32)

## 2016-08-24 ENCOUNTER — Encounter: Payer: Self-pay | Admitting: Cardiology

## 2016-08-24 ENCOUNTER — Ambulatory Visit (INDEPENDENT_AMBULATORY_CARE_PROVIDER_SITE_OTHER): Payer: Medicare Other | Admitting: Cardiology

## 2016-08-24 VITALS — BP 140/76 | HR 86 | Ht 66.0 in | Wt 246.8 lb

## 2016-08-24 DIAGNOSIS — G4733 Obstructive sleep apnea (adult) (pediatric): Secondary | ICD-10-CM | POA: Diagnosis not present

## 2016-08-24 DIAGNOSIS — E7800 Pure hypercholesterolemia, unspecified: Secondary | ICD-10-CM | POA: Diagnosis not present

## 2016-08-24 DIAGNOSIS — I2510 Atherosclerotic heart disease of native coronary artery without angina pectoris: Secondary | ICD-10-CM | POA: Diagnosis not present

## 2016-08-24 DIAGNOSIS — I10 Essential (primary) hypertension: Secondary | ICD-10-CM | POA: Diagnosis not present

## 2016-08-24 NOTE — Patient Instructions (Signed)

## 2016-08-24 NOTE — Progress Notes (Signed)
Cardiology Office Note    Date:  08/24/2016   ID:  [name], DOB 10/06/1950, MRN [id number]  PCP:  Tawanna Solo, MD  Cardiologist:  Fransico Him, MD   Chief Complaint  Patient presents with  . Sleep Apnea  . Hypertension    History of Present Illness:  [name] is a 66 y.o. female  with a history of nonobstructive ASCAD with 20% OM, HTN, dyslipidemia, and OSA on CPAP who presents today for followup. She is doing well.  She denies any exertional chest pain, dizziness, palpitations or syncope.  She has some DOE when going up stairs or walking up inclines which she attributes to weight gain (she has gained 9lbs in the past year).  Last nuclear stress test 01/2015 showed normal LVF with no ischemia. This was done for atypical CP and ultimately was felt to be due to fibrocystic breast disease.  She is having more of this atypical CP that she describes as a pin pricking sensation that only lasts a second and is gong and it is nonexertional.  She has had more problems with cysts in her breasts recently and the pain is likely related to her fibrocystic breast disease.  She had similar pain a year ago when stress test was normal.  She tolerates her CPAP well. She tolerates the nasal mask and feels the pressure is adequate. She sleeps well all night. When using her CPAP she feels rested in the am and does not fall asleep during the day except at movies.  Past Medical History:  Diagnosis Date  . Arthritis   . Chronic fatigue    and weakness  . Coronary artery disease    nonobstructive with 20% OM by cath 2006  . Depression   . Diabetes mellitus without complication ([place])   . Diastolic dysfunction    w Elevated LVEDP  . DJD (degenerative joint disease)    C3/4 Dr Sherwood Gambler  . GERD (gastroesophageal reflux disease)   . H/O hiatal hernia   . High cholesterol   . Hypertension   . Hypoglycemia   . Iron deficiency anemia   . Kidney stones   . Multiple thyroid nodules     . Neurocardiogenic syncope   . Obesity   . Prediabetes 2011 & 2012  . Right knee pain 12/2009   Dr Marlou Sa  . Sleep apnea    CPAP had Sleep Study ordered by Dr. Radford Pax  . Vasovagal syncope     Past Surgical History:  Procedure Laterality Date  . ACHILLES TENDON SURGERY Left 01/23/2013   Procedure: EXCISION PARTIAL BONE TALUS/CALCANEUS, REPAIR RUPTURE ACHILLES TENDON PRIMARY OPEN ;  Surgeon: Ninetta Lights, MD;  Location: [facility];  Service: Orthopedics;  Laterality: Left;  . ANTERIOR FUSION CERVICAL SPINE     x2-cervical  . BREAST CYST EXCISION     left  . CARDIAC CATHETERIZATION  ~ 2008   Nonobstructive ASCAD, 20% OM  . CARPAL TUNNEL RELEASE     L side  . JOINT REPLACEMENT  2012   R total knee  . JOINT REPLACEMENT  2013   lt total knee  . LITHOTRIPSY    . TONSILLECTOMY    . TOTAL KNEE ARTHROPLASTY  11/2010  . TOTAL KNEE ARTHROPLASTY  11/2011   left  . TOTAL KNEE ARTHROPLASTY  12/13/2011   Procedure: TOTAL KNEE ARTHROPLASTY;  Surgeon: Ninetta Lights, MD;  Location: [facility];  Service: Orthopedics;  Laterality: Left;  DR MURPHY WANTS 90  MINUTES FOR THIS CASE    Current Medications: Outpatient Medications Prior to Visit  Medication Sig Dispense Refill  . ALPRAZolam (XANAX) 1 MG tablet Take 1 mg by mouth at bedtime.  5  . ammonium lactate (LAC-HYDRIN) 12 % cream Apply topically as needed for dry skin. 385 g 0  . aspirin 81 MG tablet Take 81 mg by mouth daily.    Marland Kitchen desvenlafaxine (PRISTIQ) 50 MG 24 hr tablet Take 50 mg by mouth daily.    Marland Kitchen esomeprazole (NEXIUM) 40 MG capsule Take 40 mg by mouth daily before breakfast.    . ezetimibe (ZETIA) 10 MG tablet TAKE 1 TABLET BY MOUTH EVERY DAY 30 tablet 6  . ibuprofen (ADVIL,MOTRIN) 200 MG tablet Take 400 mg by mouth every 8 (eight) hours as needed (pain).    Marland Kitchen KRILL OIL PO Take 3 capsules by mouth daily.     Marland Kitchen lisinopril (PRINIVIL,ZESTRIL) 5 MG tablet TAKE 1 TABLET BY MOUTH EVERY DAY 30 tablet 1  . meloxicam (MOBIC) 15  MG tablet TAKE 1 TABLET BY MOUTH EVERY DAY 30 tablet 0  . metFORMIN (GLUCOPHAGE) 500 MG tablet Take 500 mg by mouth every evening.     . simvastatin (ZOCOR) 40 MG tablet TAKE 1 TABLET (40 MG TOTAL) BY MOUTH AT BEDTIME. 30 tablet 11  . ZETIA 10 MG tablet TAKE 1 TABLET (10 MG TOTAL) BY MOUTH DAILY. 30 tablet 1  . zolpidem (AMBIEN) 10 MG tablet Take 1.25 mg by mouth at bedtime.      No facility-administered medications prior to visit.      Allergies:   Guaifenesin & derivatives; Morphine and related; Penicillins; Tramadol; Hydrocodone-acetaminophen; and Oxycodone hcl   Social History   Social History  . Marital status: Single    Spouse name: N/A  . Number of children: N/A  . Years of education: N/A   Social History Main Topics  . Smoking status: Never Smoker  . Smokeless tobacco: Never Used  . Alcohol use 3.6 oz/week    6 Glasses of wine per week     Comment: couple times per week.  . Drug use: No  . Sexual activity: No   Other Topics Concern  . None   Social History Narrative  . None     Family History:  The patient's family history includes CAD in her father; CVA in her mother; Cancer - Prostate in her father; Heart disease in her father; Hypertension in her sister.   ROS:   Please see the history of present illness.    ROS All other systems reviewed and are negative.  No flowsheet data found.     PHYSICAL EXAM:   VS:  BP 140/76   Pulse 86   Ht 5\' 6"  (1.676 m)   Wt 246 lb 12.8 oz (111.9 kg)   SpO2 96%   BMI 39.83 kg/m    GEN: Well nourished, well developed, in no acute distress  HEENT: normal  Neck: no JVD, carotid bruits, or masses Cardiac: RRR; no murmurs, rubs, or gallops,no edema.  Intact distal pulses bilaterally.  Respiratory:  clear to auscultation bilaterally, normal work of breathing GI: soft, nontender, nondistended, + BS MS: no deformity or atrophy  Skin: warm and dry, no rash Neuro:  Alert and Oriented x 3, Strength and sensation are  intact Psych: euthymic mood, full affect  Wt Readings from Last 3 Encounters:  08/24/16 246 lb 12.8 oz (111.9 kg)  08/09/15 237 lb (107.5 kg)  07/02/15 234 lb (106.1  kg)      Studies/Labs Reviewed:   EKG:  EKG is not ordered today.    Recent Labs: 01/20/2016: Hemoglobin 12.5; Platelets 247 08/22/2016: ALT 78; BUN 14; Creatinine, Ser 0.75; Potassium 4.3; Sodium 141   Lipid Panel    Component Value Date/Time   CHOL 158 08/22/2016 1218   TRIG 179 (H) 08/22/2016 1218   HDL 45 08/22/2016 1218   CHOLHDL 3.5 08/22/2016 1218   CHOLHDL 3.9 08/05/2015 1149   VLDL 34 (H) 08/05/2015 1149   LDLCALC 77 08/22/2016 1218   LDLDIRECT 93.3 09/17/2013 1307    Additional studies/ records that were reviewed today include:  CPAP download     ASSESSMENT:    1. Atherosclerosis of native coronary artery of native heart without angina pectoris   2. Essential hypertension, benign   3. Obstructive sleep apnea   4. Pure hypercholesterolemia      PLAN:  In order of problems listed above:  1.  ASCAD - nonobstructive - she has atypical CP that I think is related to fibrocystic breast disease and had similar symptoms last year and nuclear stress test was normal.  Continue ASA/statin. 2.  HTN - BP controlled on current meds.  Continue ACE I 3.  OSA - the patient is tolerating PAP therapy well without any problems. The PAP download was reviewed today and showed an AHI of 1.1/hr on 12 cm H2O with 74% compliance in using more than 4 hours nightly.  The patient has been using and benefiting from CPAP use and will continue to benefit from therapy.  3.  Hyperlipidemia - LDL goal < 70. Her last LDL was 77 and not at goal and LFTs were elevated.  Her AST was 85 and ALT 103.  I suspect this is due to fatty infiltration of the liver.  I am going to refer her to GI for evaluation and refer to lipid clinic.  She will continue statin/zetia for now until seen in lipid clinic.      Medication Adjustments/Labs  and Tests Ordered: Current medicines are reviewed at length with the patient today.  Concerns regarding medicines are outlined above.  Medication changes, Labs and Tests ordered today are listed in the Patient Instructions below.  Patient Instructions  Medication Instructions:  Your physician recommends that you continue on your current medications as directed. Please refer to the Current Medication list given to you today.   Labwork: None  Testing/Procedures: None  Follow-Up: Your physician wants you to follow-up in: 1 year with Dr. Radford Pax. You will receive a reminder letter in the mail two months in advance. If you don't receive a letter, please call our office to schedule the follow-up appointment.   Any Other Special Instructions Will Be Listed Below (If Applicable).     If you need a refill on your cardiac medications before your next appointment, please call your pharmacy.      Signed, Fransico Him, MD  08/24/2016 8:22 PM    [facility] [address], [place], [place]  [address] Phone: [phone]; Fax: [phone]

## 2016-08-30 ENCOUNTER — Telehealth: Payer: Self-pay | Admitting: Cardiology

## 2016-08-30 DIAGNOSIS — R945 Abnormal results of liver function studies: Principal | ICD-10-CM

## 2016-08-30 DIAGNOSIS — R7989 Other specified abnormal findings of blood chemistry: Secondary | ICD-10-CM

## 2016-08-30 MED ORDER — PRAVASTATIN SODIUM 40 MG PO TABS: 40.0000 mg | ORAL_TABLET | Freq: Every day | ORAL | 11 refills | Status: DC

## 2016-08-30 NOTE — Telephone Encounter (Signed)
-----   Message from Stanton Kidney, RN sent at 08/25/2016  3:34 PM EST -----   ----- Message ----- From: Leeroy Bock, RPH Sent: 08/25/2016   2:52 PM To: Stanton Kidney, RN  Would try either Lipitor 20mg  or pravastatin 40mg  daily. Please schedule pt in lipid clinic after f/u labs in 1 month.

## 2016-08-30 NOTE — Telephone Encounter (Signed)
Pt calling regarding labs results came back and was told Dr. Radford Pax recommended she change from Simvastatin to Crestor. She was on Crestor in the past and it didn't agree with her, had a lot of joint pain-wants to know if anything else she can take? she was also taking Mega Red fish oil and was told it wasn't enough so now she is taking a 1300 fish oil capsule, wants to know how many of those to take to make it  enough

## 2016-08-30 NOTE — Telephone Encounter (Signed)
Instructed patient to START PRAVASTATIN 40 mg daily. FLP and LFTs scheduled 3/1. Lipid Clinic OV scheduled 3/6. GI referral placed. Patient is taking fish oil 1300 mg 3 tablets daily. She understands she will be called if changes need to be made. Patient agrees with treatment plan.  Spoke with RPH regarding fish oil. She states that is fine.

## 2016-08-31 ENCOUNTER — Encounter: Payer: Self-pay | Admitting: Gastroenterology

## 2016-09-04 ENCOUNTER — Telehealth: Payer: Self-pay | Admitting: Cardiology

## 2016-09-04 NOTE — Telephone Encounter (Signed)
New message   New message   Pt verbalized that she wants to talk to rn she is confused on the appts for labs and the appt for the GI

## 2016-09-04 NOTE — Telephone Encounter (Signed)
Patient wanted to make sure of timeline for elevated LFTs. Reviewed lab appointment time and Lipid Clinic OV. Confirmed GI appointment with Dr. Havery Moros. She was grateful for clarification.

## 2016-09-04 NOTE — Telephone Encounter (Signed)
F/u message  Pt call requesting to speak with RN.

## 2016-09-10 ENCOUNTER — Other Ambulatory Visit: Payer: Self-pay | Admitting: Cardiology

## 2016-09-13 ENCOUNTER — Telehealth: Payer: Self-pay | Admitting: Cardiology

## 2016-09-13 NOTE — Telephone Encounter (Signed)
New message      Pt c/o medication issue:  1. Name of Medication: pravastatin 2. How are you currently taking this medication (dosage and times per day)?  40mg   3. Are you having a reaction (difficulty breathing--STAT)?  4. What is your medication issue? Pt states that she is having itchy sensations (no rash), lots of nausea and fatigue.  Could it be a side effect from the medication?  Please call

## 2016-09-13 NOTE — Telephone Encounter (Signed)
Patient reports lethargy, mild aching, a "prickly feeling" in her skin, nausea and mild headaches since starting Pravastatin 40 mg at the end of January. She states the prickly feeling reminds her of the feelings she gets from painkillers she is allergic to but not as bad. She states she takes her Pravastatin at night and she is only nauseous and has headaches in the morning.  She states she is willing to try the medication with dinner tonight to see if it helps with the nausea tomorrow, but she wishes for new medication recommendations long term to prevent her other symptoms.   To Lipid Clinic for recommendations.

## 2016-09-14 NOTE — Telephone Encounter (Signed)
Left message to call back  

## 2016-09-14 NOTE — Telephone Encounter (Signed)
Would pt be willing to try Lipitor 20mg  daily? I can't see on her medical record that she has tried this yet. Agree that she needs GI referral to evaluate for fatty liver but this will not be a contraindication to statin use.

## 2016-09-14 NOTE — Telephone Encounter (Signed)
Follow Up ° ° °Pt returning phone call. Requesting call back. °

## 2016-09-15 ENCOUNTER — Telehealth: Payer: Self-pay | Admitting: Cardiology

## 2016-09-15 MED ORDER — ATORVASTATIN CALCIUM 20 MG PO TABS: 20.0000 mg | ORAL_TABLET | Freq: Every day | ORAL | 11 refills | Status: DC

## 2016-09-15 NOTE — Telephone Encounter (Signed)
Follow Up    Pt would like a call back - [name] out today. Still feeling prickly.

## 2016-09-15 NOTE — Telephone Encounter (Signed)
See phone note (09/13/16). Patient is agreeable to trying Lipitor 20 mg daily. Prescription sent to patient preferred pharmacy. Pravastatin d/c'd.

## 2016-09-15 NOTE — Telephone Encounter (Signed)
Follow Up   Pt still feeling prickly feeling. Calling back. [name] out today

## 2016-09-15 NOTE — Telephone Encounter (Signed)
Follow Up: ° ° ° °Returning [name]'s call from yesterday. °

## 2016-09-19 ENCOUNTER — Ambulatory Visit (INDEPENDENT_AMBULATORY_CARE_PROVIDER_SITE_OTHER): Payer: Medicare Other | Admitting: Podiatry

## 2016-09-19 DIAGNOSIS — M722 Plantar fascial fibromatosis: Secondary | ICD-10-CM | POA: Diagnosis not present

## 2016-09-19 NOTE — Progress Notes (Signed)
   Subjective:    Patient ID: [name], female    DOB: 07-Aug-1950, 66 y.o.   MRN: [id number]  Foot Pain    this patient presents to the office with chief complaint of pain in her right heel.  She says that  She has been wearing her orthoses but pain persists.  She believes she needs to be evaluated for possible limb length discrepancy. She says the heel lifts have deptressed and needs to be reevaluated for further heel lifts.    Review of Systems  All other systems reviewed and are negative.      Objective:   Physical Exam GENERAL APPEARANCE: Alert, conversant. Appropriately groomed. No acute distress.  VASCULAR: Pedal pulses are  palpable at  [facility] and PT bilateral.  Capillary refill time is immediate to all digits,  Normal temperature gradient.   NEUROLOGIC: sensation is normal to 5.07 monofilament at 5/5 sites bilateral.  Light touch is intact bilateral, Muscle strength normal.  MUSCULOSKELETAL: acceptable muscle strength, tone and stability bilateral.  Intrinsic muscluature intact bilateral.  Functional hallux limitus 1st MPJ  B/L.  Pain throu arch left foot.    DERMATOLOGIC: skin color, texture, and turgor are within normal limits.  No preulcerative lesions or ulcers  are seen, no interdigital maceration noted.  No open lesions present.  Digital nails are asymptomatic. No drainage noted.          Assessment & Plan:  Plantar fascitis  ROV  Patient was seen by Velora Heckler.  He dispensed a 1/4 inch cork heel lift and applied it to her right orthotic. She is to call Liliane Channel with results from new heel lift.  Gardiner Barefoot DPM

## 2016-09-28 ENCOUNTER — Other Ambulatory Visit: Payer: Medicare Other

## 2016-10-03 ENCOUNTER — Ambulatory Visit: Payer: Medicare Other

## 2016-10-09 ENCOUNTER — Ambulatory Visit: Payer: Medicare Other | Admitting: Gastroenterology

## 2016-10-10 ENCOUNTER — Telehealth: Payer: Self-pay | Admitting: Cardiology

## 2016-10-10 ENCOUNTER — Other Ambulatory Visit: Payer: Medicare Other

## 2016-10-10 NOTE — Telephone Encounter (Signed)
[name] is calling wanting to know because she r/s her appt with Dr. Havery Moros to April will she need to change it or will the labs and visit to see the pharmacist affect this appt . Please call

## 2016-10-10 NOTE — Telephone Encounter (Signed)
Patient had to reschedule appointments because she has the flu. She wants to know if she should reschedule Lipid Clinic appointment until after appointment with GI.  Instructed patient to keep appointments as scheduled for now and she will be notified if anything needs to be rescheduled.

## 2016-10-13 ENCOUNTER — Ambulatory Visit: Payer: Medicare Other

## 2016-10-13 ENCOUNTER — Other Ambulatory Visit: Payer: Medicare Other | Admitting: *Deleted

## 2016-10-13 DIAGNOSIS — R945 Abnormal results of liver function studies: Principal | ICD-10-CM

## 2016-10-13 DIAGNOSIS — R7989 Other specified abnormal findings of blood chemistry: Secondary | ICD-10-CM

## 2016-10-14 LAB — LIPID PANEL
CHOLESTEROL TOTAL: 149 mg/dL (ref 100–199)
Chol/HDL Ratio: 3.5 ratio units (ref 0.0–4.4)
HDL: 43 mg/dL (ref >39)
LDL CALC: 75 mg/dL (ref 0–99)
Triglycerides: 153 mg/dL — ABNORMAL HIGH (ref 0–149)
VLDL Cholesterol Cal: 31 mg/dL (ref 5–40)

## 2016-10-14 LAB — HEPATIC FUNCTION PANEL
ALK PHOS: 83 IU/L (ref 39–117)
ALT: 60 IU/L — AB (ref 0–32)
AST: 42 IU/L — AB (ref 0–40)
Albumin: 4.2 g/dL (ref 3.6–4.8)
BILIRUBIN, DIRECT: 0.11 mg/dL (ref 0.00–0.40)
Bilirubin Total: 0.2 mg/dL (ref 0.0–1.2)
Total Protein: 6.4 g/dL (ref 6.0–8.5)

## 2016-10-19 ENCOUNTER — Ambulatory Visit (INDEPENDENT_AMBULATORY_CARE_PROVIDER_SITE_OTHER): Payer: Medicare Other | Admitting: Pharmacist

## 2016-10-19 DIAGNOSIS — E785 Hyperlipidemia, unspecified: Secondary | ICD-10-CM | POA: Diagnosis not present

## 2016-10-19 NOTE — Patient Instructions (Signed)
Continue taking Lipitor 20mg  and Zetia 10mg  daily.  Look for foods higher in fiber and protein. Try to limit saturated fat.  Follow up for cholesterol lab work on Tuesday, May 22nd. Come in any time after 7:30am for fasting blood work.

## 2016-10-19 NOTE — Progress Notes (Signed)
Patient ID: [name]                 DOB: 1951-01-27                    MRN: [id number]     HPI: [name] is a 66 y.o. female patient referred to lipid clinic by Dr Radford Pax. PMH is significant for nonobstructive ASCAD with 20% OM, HTN, DM2, dyslipidemia, and OSA on CPAP. In January 2018, LDL was 77 and ALT was 78 while pt was taking simvastatin 40mg  daily and Zetia 10mg  daily. She switched to pravastatin 40mg  daily but after a few weeks, started experiencing lethargy, mild aching, "prickly feeling" in her skin, nausea, and mild headaches. Pt was agreeable to switch to Lipitor 20mg  daily on 2/16. F/u labs showed improvement in ALT down to 60. She has also been referred to GI to r/o fatty liver as source for LFT elevation. Pt presents today for follow up.  Pt has been tolerating Lipitor well. She has only been taking it for about 3 weeks. She is seeing Dr Havery Moros in April for GI evaluation of elevated LFTs and ? fatty liver. Pt has many questions about diet and her diabetes.  Current Medications: Zetia 10mg  daily, Lipitor 20mg  daily, fish oil or krill oil 3 tabs daily Intolerances: Crestor - myalgias, pravastatin 40mg  - lethargy, mild aching, prickly feeling on her skin Risk Factors: CAD with 20% OM LDL goal: 70mg /dL  Diet: Admits that she does not eat well. Wants to eat less cereal and muffins d/t diabetes. Planning to switch from ranch dressing to a vinaigrette. Does eat Haagen [employer]az coffee ice cream frequently.  Exercise: 20 mins 4-5 days per week - walks her dog  Family History: Father with CAD and heart disease. Sister with HTN. Mother with CVA  Social History: Denies tobacco and illicit drug use. Drinks alcohol occasionally.  Labs: 09/2016: TC 149, TG 153, HDL 43, LDL 75, ALT 60 (atorvastatin 20mg  daily, Zetia 10mg  daily) 07/2016: TC 158, TG 179, HDL 45, LDL 77, ALT 78 (simvastatin 40mg  daily, Zetia 10mg  daily)  Past Medical History:  Diagnosis Date  . Arthritis   .  Chronic fatigue    and weakness  . Coronary artery disease    nonobstructive with 20% OM by cath 2006  . Depression   . Diabetes mellitus without complication ([place])   . Diastolic dysfunction    w Elevated LVEDP  . DJD (degenerative joint disease)    C3/4 Dr Sherwood Gambler  . GERD (gastroesophageal reflux disease)   . H/O hiatal hernia   . High cholesterol   . Hypertension   . Hypoglycemia   . Iron deficiency anemia   . Kidney stones   . Multiple thyroid nodules   . Neurocardiogenic syncope   . Obesity   . Prediabetes 2011 & 2012  . Right knee pain 12/2009   Dr Marlou Sa  . Sleep apnea    CPAP had Sleep Study ordered by Dr. Radford Pax  . Vasovagal syncope     Current Outpatient Prescriptions on File Prior to Visit  Medication Sig Dispense Refill  . ALPRAZolam (XANAX) 1 MG tablet Take 1 mg by mouth at bedtime.  5  . ammonium lactate (LAC-HYDRIN) 12 % cream Apply topically as needed for dry skin. 385 g 0  . aspirin 81 MG tablet Take 81 mg by mouth daily.    Marland Kitchen atorvastatin (LIPITOR) 20 MG tablet Take 1 tablet (20 mg total) by mouth  daily. 30 tablet 11  . desvenlafaxine (PRISTIQ) 50 MG 24 hr tablet Take 50 mg by mouth daily.    Marland Kitchen esomeprazole (NEXIUM) 40 MG capsule Take 40 mg by mouth daily before breakfast.    . ezetimibe (ZETIA) 10 MG tablet TAKE 1 TABLET BY MOUTH EVERY DAY 30 tablet 6  . ibuprofen (ADVIL,MOTRIN) 200 MG tablet Take 400 mg by mouth every 8 (eight) hours as needed (pain).    Marland Kitchen KRILL OIL PO Take 3 capsules by mouth daily.     Marland Kitchen lisinopril (PRINIVIL,ZESTRIL) 5 MG tablet TAKE 1 TABLET BY MOUTH EVERY DAY 30 tablet 10  . meloxicam (MOBIC) 15 MG tablet TAKE 1 TABLET BY MOUTH EVERY DAY 30 tablet 0  . metFORMIN (GLUCOPHAGE) 500 MG tablet Take 500 mg by mouth every evening.     . traZODone (DESYREL) 50 MG tablet Take 100 mg by mouth at bedtime.     Marland Kitchen ZETIA 10 MG tablet TAKE 1 TABLET (10 MG TOTAL) BY MOUTH DAILY. 30 tablet 1  . zolpidem (AMBIEN) 10 MG tablet Take 1.25 mg by mouth at  bedtime.      No current facility-administered medications on file prior to visit.     Allergies  Allergen Reactions  . Guaifenesin & Derivatives Itching  . Morphine And Related Itching    Hydrocodone and oxycodone  . Penicillins Itching  . Tramadol Itching  . Hydrocodone-Acetaminophen Itching  . Oxycodone Hcl Itching    Assessment/Plan:  1. Hyperlipidemia - LDL 75 and ALT improved to 60 since switching to Lipitor 20mg  3 weeks ago. Expect that LDL will continue to drop to goal < 70mg /dL on Lipitor 20mg  and Zetia 10mg . Will continue current therapy and recheck lipids and LFTs in 2 months. She will f/u with GI next month regarding LFTs. ALT has trended down to 60, I am comfortable with her remaining on statin and Zetia. She will work to make lifestyle improvements. Will walk more frequently, switch to low fat ice cream, and look for higher fiber carb sources.  Will f/u with lab results in 2 months.   Megan E. Supple, PharmD, CPP, [facility] [address], [place], [place] [address] Phone: [phone]; Fax: [phone] 10/19/2016 12:39 PM

## 2016-11-01 ENCOUNTER — Other Ambulatory Visit: Payer: Self-pay | Admitting: Family Medicine

## 2016-11-01 DIAGNOSIS — R202 Paresthesia of skin: Secondary | ICD-10-CM

## 2016-11-14 ENCOUNTER — Encounter: Payer: Self-pay | Admitting: Gastroenterology

## 2016-11-14 ENCOUNTER — Other Ambulatory Visit (INDEPENDENT_AMBULATORY_CARE_PROVIDER_SITE_OTHER): Payer: Medicare Other

## 2016-11-14 ENCOUNTER — Ambulatory Visit (INDEPENDENT_AMBULATORY_CARE_PROVIDER_SITE_OTHER): Payer: Medicare Other | Admitting: Gastroenterology

## 2016-11-14 VITALS — BP 114/68 | HR 84 | Ht 66.0 in | Wt 241.2 lb

## 2016-11-14 DIAGNOSIS — K219 Gastro-esophageal reflux disease without esophagitis: Secondary | ICD-10-CM

## 2016-11-14 DIAGNOSIS — R7401 Elevation of levels of liver transaminase levels: Secondary | ICD-10-CM

## 2016-11-14 DIAGNOSIS — R1310 Dysphagia, unspecified: Secondary | ICD-10-CM

## 2016-11-14 DIAGNOSIS — R740 Nonspecific elevation of levels of transaminase and lactic acid dehydrogenase [LDH]: Secondary | ICD-10-CM

## 2016-11-14 LAB — CBC WITH DIFFERENTIAL/PLATELET
BASOS ABS: 0.1 10*3/uL (ref 0.0–0.1)
Basophils Relative: 0.7 % (ref 0.0–3.0)
Eosinophils Absolute: 0.2 10*3/uL (ref 0.0–0.7)
Eosinophils Relative: 2.5 % (ref 0.0–5.0)
HEMATOCRIT: 38.1 % (ref 36.0–46.0)
Hemoglobin: 12.5 g/dL (ref 12.0–15.0)
LYMPHS PCT: 20.8 % (ref 12.0–46.0)
Lymphs Abs: 1.4 10*3/uL (ref 0.7–4.0)
MCHC: 32.7 g/dL (ref 30.0–36.0)
MCV: 77.6 fl — AB (ref 78.0–100.0)
MONOS PCT: 6.7 % (ref 3.0–12.0)
Monocytes Absolute: 0.4 10*3/uL (ref 0.1–1.0)
NEUTROS ABS: 4.6 10*3/uL (ref 1.4–7.7)
Neutrophils Relative %: 69.3 % (ref 43.0–77.0)
Platelets: 259 10*3/uL (ref 150.0–400.0)
RBC: 4.92 Mil/uL (ref 3.87–5.11)
RDW: 16.2 % — ABNORMAL HIGH (ref 11.5–15.5)
WBC: 6.7 10*3/uL (ref 4.0–10.5)

## 2016-11-14 LAB — IBC PANEL
IRON: 30 ug/dL — AB (ref 42–145)
Saturation Ratios: 6.3 % — ABNORMAL LOW (ref 20.0–50.0)
Transferrin: 340 mg/dL (ref 212.0–360.0)

## 2016-11-14 LAB — PROTIME-INR
INR: 1.1 ratio — AB (ref 0.8–1.0)
PROTHROMBIN TIME: 11 s (ref 9.6–13.1)

## 2016-11-14 LAB — FERRITIN: Ferritin: 27.2 ng/mL (ref 10.0–291.0)

## 2016-11-14 NOTE — Progress Notes (Signed)
HPI :  66 y/o female with h/o DM, obesity, CAD, here or a new patient evaluation for elevated LAEs. She also wishes to discuss history of GERD and dysphagia.  ALT elevated to 103 about 9 months ago, then to 60 about one month ago. AST 42-85 with normal bilirubin and AP. One year ago ALT was 46. LFTs normal prior to one year ago were normal, ALT between 10s and 20s.  She denies any FH of liver disease. No FH of liver disease noted. No history of jaundice. Metformin added within in the past year, used to be on simvastatin now on atorvastatin. She denies any herbal supplements or OTCs. She reports she has been overweight, BMI 39, she reports stable.  She drinks roughly a glass of wine with dinner, perhaps 2 glasses per night at most, but has recentlycut back to a few days per week. She thinks routine alcohol use ongoing roughly 8-10 years or so.   She has a history of GERD, using nexium 40mg  q day for this. She does endorse dysphagia periodically to solids, in her upper chest after swallows. She does not think she's had a prior EGD. Prior barium study in 2015 was negative. Heartburn relatively well controlled on this regimen.  She has a history of CAD and had a low risk stress test and echo in 2016. EF normal.   Colonoscopy done with Dr. Cristina Gong within the past 5 years. No records available.   Past Medical History:  Diagnosis Date  . Arthritis   . Chronic fatigue    and weakness  . Colon polyps   . Coronary artery disease    nonobstructive with 20% OM by cath 2006  . Depression   . Diabetes mellitus without complication ([place])   . Diastolic dysfunction    w Elevated LVEDP  . DJD (degenerative joint disease)    C3/4 Dr Sherwood Gambler  . GERD (gastroesophageal reflux disease)   . H/O hiatal hernia   . High cholesterol   . Hypertension   . Hypoglycemia   . Iron deficiency anemia   . Kidney stones   . Multiple thyroid nodules   . Neurocardiogenic syncope   . Obesity   . Prediabetes  2011 & 2012  . Right knee pain 12/2009   Dr Marlou Sa  . Sleep apnea    CPAP had Sleep Study ordered by Dr. Radford Pax  . Vasovagal syncope      Past Surgical History:  Procedure Laterality Date  . ACHILLES TENDON SURGERY Left 01/23/2013   Procedure: EXCISION PARTIAL BONE TALUS/CALCANEUS, REPAIR RUPTURE ACHILLES TENDON PRIMARY OPEN ;  Surgeon: Ninetta Lights, MD;  Location: [facility];  Service: Orthopedics;  Laterality: Left;  . ANTERIOR FUSION CERVICAL SPINE     x2-cervical  . BREAST CYST EXCISION Left   . CARDIAC CATHETERIZATION  ~ 2008   Nonobstructive ASCAD, 20% OM  . CARPAL TUNNEL RELEASE Left   . LITHOTRIPSY    . TONSILLECTOMY    . TOTAL KNEE ARTHROPLASTY Right 11/2010  . TOTAL KNEE ARTHROPLASTY  12/13/2011   Procedure: TOTAL KNEE ARTHROPLASTY;  Surgeon: Ninetta Lights, MD;  Location: [facility];  Service: Orthopedics;  Laterality: Left;  DR MURPHY WANTS 42 MINUTES FOR THIS CASE   Family History  Problem Relation Age of Onset  . CAD Father   . Cancer - Prostate Father   . Heart disease Father   . Diverticulosis Father   . Hypertension Sister   . Diverticulosis Sister   .  Breast cancer Sister   . CVA Mother   . Diverticulosis Mother   . Kidney disease Mother   . Heart disease Paternal Grandfather   . Crohn's disease Paternal Aunt   . Cancer Paternal Aunt     x 2, type unknown  . Colon cancer Other     maternal great Aunt   Social History  Substance Use Topics  . Smoking status: Never Smoker  . Smokeless tobacco: Never Used  . Alcohol use 3.6 oz/week    6 Glasses of wine per week     Comment: couple times per week.   Current Outpatient Prescriptions  Medication Sig Dispense Refill  . ALPRAZolam (XANAX) 1 MG tablet Take 1 mg by mouth at bedtime.  5  . ammonium lactate (LAC-HYDRIN) 12 % cream Apply topically as needed for dry skin. 385 g 0  . aspirin 81 MG tablet Take 81 mg by mouth daily.    Marland Kitchen atorvastatin (LIPITOR) 20 MG tablet Take 1 tablet (20 mg total)  by mouth daily. 30 tablet 11  . desvenlafaxine (PRISTIQ) 50 MG 24 hr tablet Take 50 mg by mouth daily.    Marland Kitchen esomeprazole (NEXIUM) 40 MG capsule Take 40 mg by mouth daily before breakfast.    . ezetimibe (ZETIA) 10 MG tablet TAKE 1 TABLET BY MOUTH EVERY DAY 30 tablet 6  . ibuprofen (ADVIL,MOTRIN) 200 MG tablet Take 400 mg by mouth as needed (pain).     Marland Kitchen lisinopril (PRINIVIL,ZESTRIL) 5 MG tablet TAKE 1 TABLET BY MOUTH EVERY DAY 30 tablet 10  . meloxicam (MOBIC) 15 MG tablet TAKE 1 TABLET BY MOUTH EVERY DAY (Patient taking differently: TAKE 1 TABLET BY MOUTH AS NEEDED) 30 tablet 0  . metFORMIN (GLUCOPHAGE) 500 MG tablet Take 500 mg by mouth every evening.     . Omega-3 Fatty Acids (FISH OIL) 1200 MG CAPS Take 3 capsules by mouth daily.    . traZODone (DESYREL) 50 MG tablet Take 100 mg by mouth at bedtime.      No current facility-administered medications for this visit.    Allergies  Allergen Reactions  . Guaifenesin & Derivatives Itching  . Morphine And Related Itching    Hydrocodone and oxycodone  . Penicillins Itching  . Tramadol Itching  . Hydrocodone-Acetaminophen Itching  . Oxycodone Hcl Itching     Review of Systems: All systems reviewed and negative except where noted in HPI.    Lab Results  Component Value Date   CREATININE 0.75 08/22/2016   BUN 14 08/22/2016   NA 141 08/22/2016   K 4.3 08/22/2016   CL 102 08/22/2016   CO2 25 08/22/2016    Lab Results  Component Value Date   ALT 60 (H) 10/13/2016   AST 42 (H) 10/13/2016   ALKPHOS 83 10/13/2016   BILITOT 0.2 10/13/2016    Physical Exam: BP 114/68 (BP Location: Left Arm, Patient Position: Sitting, Cuff Size: Large)   Pulse 84   Ht 5\' 6"  (1.676 m) Comment: height measured without shoes  Wt 241 lb 4 oz (109.4 kg)   BMI 38.94 kg/m  Constitutional: Pleasant,well-developed, female in no acute distress. HEENT: Normocephalic and atraumatic. Conjunctivae are normal. No scleral icterus. Neck supple.    Cardiovascular: Normal rate, regular rhythm.  Pulmonary/chest: Effort normal and breath sounds normal. No wheezing, rales or rhonchi. Abdominal: Soft, protuberant, nontender.  There are no masses palpable. No hepatomegaly appreciated although difficult to palpate with protuberant abdomen. Extremities: no edema Lymphadenopathy: No cervical adenopathy noted. Neurological:  Alert and oriented to person place and time. Skin: Skin is warm and dry. No rashes noted. Psychiatric: Normal mood and affect. Behavior is normal.   ASSESSMENT AND PLAN: 66 year old female with history of obesity, diabetes, coronary artery disease, here for new patient evaluation of the following issues:  Elevated ALT - one year mild elevation. Discussed differential, I suspect she may likely have fatty liver disease from combination of her metabolic syndrome and alcohol use, and we discussed spectrum of fatty liver disease and risks for cirrhosis. We'll obtain ultrasound the abdomen to further evaluate for steatosis, and also screen her with labs to rule out chronic liver diseases - viral hepatitis, iron studies, autoimmune hepatitis, and test for immunity to hep A and B. Will also send CBC to assess for thrombocytopenia and INR. We'll await results of these exams. If she does have steatosis recommend diet and exercise for weight loss, and I recommend she completely abstain from alcohol at this time. Okay to continue statin and reassured her. She agreed  GERD / dysphagia - long-standing reflux relatively well controlled with PPI, now with solid food dysphagia. Discussed differential with her and offered her an EGD to further evaluate and potentially dilate as needed. I discussed risks and benefits of EGD and she wished proceed. Otherwise counseled her on long-term potential risks of PPIs, recommend low-dose daily dose needed to control symptoms.   Of note, will obtain prior colonoscopy records from [facility] to clarify when she is  next due.  Lisle Cellar, MD [facility] Pager [phone]  CC: Kathyrn Lass, MD

## 2016-11-14 NOTE — Patient Instructions (Signed)
If you are age 67 or older, your body mass index should be between 23-30. Your Body mass index is 38.94 kg/m. If this is out of the aforementioned range listed, please consider follow up with your Primary Care Provider.  If you are age 86 or younger, your body mass index should be between 19-25. Your Body mass index is 38.94 kg/m. If this is out of the aformentioned range listed, please consider follow up with your Primary Care Provider.   Your physician has requested that you go to the basement for lab work before leaving today.  You have been scheduled for an endoscopy and colonoscopy. Please follow the written instructions given to you at your visit today. Please pick up your prep supplies at the pharmacy within the next 1-3 days. If you use inhalers (even only as needed), please bring them with you on the day of your procedure. Your physician has requested that you go to www.startemmi.com and enter the access code given to you at your visit today. This web site gives a general overview about your procedure. However, you should still follow specific instructions given to you by our office regarding your preparation for the procedure.  You have been scheduled for an abdominal ultrasound at [facility] (1st floor of hospital) on _________ at _____________. Please arrive 15 minutes prior to your appointment for registration. Make certain not to have anything to eat or drink 4 hours prior to your appointment. Should you need to reschedule your appointment, please contact radiology at [phone]. This test typically takes about 30 minutes to perform.

## 2016-11-15 ENCOUNTER — Ambulatory Visit
Admission: RE | Admit: 2016-11-15 | Discharge: 2016-11-15 | Disposition: A | Discharge status: Home / Self Care | Payer: Medicare Other | Source: Ambulatory Visit | Attending: Family Medicine | Admitting: Family Medicine

## 2016-11-15 ENCOUNTER — Telehealth: Payer: Self-pay

## 2016-11-15 DIAGNOSIS — R202 Paresthesia of skin: Secondary | ICD-10-CM

## 2016-11-15 LAB — HEPATITIS C ANTIBODY: HCV AB: NEGATIVE

## 2016-11-15 LAB — IGG: IgG (Immunoglobin G), Serum: 770 mg/dL (ref 694–1618)

## 2016-11-15 LAB — ANTI-NUCLEAR AB-TITER (ANA TITER)

## 2016-11-15 LAB — HEPATITIS A ANTIBODY, TOTAL: Hep A Total Ab: NONREACTIVE

## 2016-11-15 LAB — HEPATITIS B SURFACE ANTIGEN: HEP B S AG: NEGATIVE

## 2016-11-15 LAB — ANTI-SMOOTH MUSCLE ANTIBODY, IGG

## 2016-11-15 LAB — ANA: ANA: POSITIVE — AB

## 2016-11-15 LAB — HEPATITIS B SURFACE ANTIBODY,QUALITATIVE: Hep B S Ab: NEGATIVE

## 2016-11-15 MED ORDER — GADOBENATE DIMEGLUMINE 529 MG/ML IV SOLN
20.0000 mL | Freq: Once | INTRAVENOUS | Status: AC | PRN
  Administered 2016-11-15: 20 mL via INTRAVENOUS

## 2016-11-15 NOTE — Telephone Encounter (Signed)
Patient seen yesterday, routing this call to [facility].

## 2016-11-16 ENCOUNTER — Telehealth: Payer: Self-pay

## 2016-11-16 NOTE — Telephone Encounter (Addendum)
Pt scheduled for abdominal u/s 11/23/16 at 9:30am. Pt to arrive at 9:15am. Pt is to be NPO 6-8 hour prior to scan. Pt is aware.  Egd r/s to 5/17 at 10--pt had requested for it to be r/s  1st [facility] scheduled for 5/3  Dr. Loni Muse,  Pt is very concerned about the risk of the Egd procedure. She wants to discuss with you in further detail.

## 2016-11-17 NOTE — Telephone Encounter (Signed)
Called patient answered all of her questions. EGD recommended given ongoing symptoms of dysphagia and history of reflux. I discussed all risks with her, which are low. She is agreeable to proceed. [name] if she is not yet already on the schedule can you book her for this? thanks

## 2016-11-17 NOTE — Telephone Encounter (Signed)
Pt is booked 12/14/16

## 2016-11-20 ENCOUNTER — Telehealth: Payer: Self-pay | Admitting: Gastroenterology

## 2016-11-20 NOTE — Telephone Encounter (Signed)
Let patient know about colonoscopy result date, she states she has had one done since then. She will contact [facility] and have them send over her more current results. She really is not interested in doing another colonoscopy if at all possible. She states she will call back when she has contacted them.  Patient called back, she had one done in 05/2013. She didn't however ask that they send this result over. I will contact them to have this information sent over. Patient asked that we call her back to let her know.

## 2016-11-20 NOTE — Telephone Encounter (Signed)
Patient advised of 2014 results and no need at present time for another colonoscopy.

## 2016-11-20 NOTE — Telephone Encounter (Signed)
[name] can you please relay the following to this patient.  Colonoscopy records obtained. Her last colonoscopy was 09/26/2002. No polyps, only diverticulosis noted. She is due for colon cancer screening at this time. We can add a colonoscopy to her scheduled EGD on 5/17 if she wishes to have both done at the same time (this would mean we would start at 730). If she does not want to have a colonoscopy we can consider stool based testing (FIT or Cologuard), but if these are positive she will need to have a colonoscopy in that setting.  Can you discuss with her and see if she wants to proceed with colonoscopy or stool testing?  Thanks much. I called her last week and answered several questions relating to her scheduled EGD for which she wishes to proceed. Thanks

## 2016-11-20 NOTE — Telephone Encounter (Signed)
Okay thanks for clarifying. If she had one done in 2014 and no significant abnormalities I agree we won't need to do another now. Thanks

## 2016-11-22 ENCOUNTER — Ambulatory Visit: Payer: Medicare Other | Attending: Neurosurgery | Admitting: Physical Therapy

## 2016-11-22 DIAGNOSIS — G8929 Other chronic pain: Secondary | ICD-10-CM | POA: Diagnosis present

## 2016-11-22 DIAGNOSIS — M545 Low back pain, unspecified: Secondary | ICD-10-CM

## 2016-11-22 DIAGNOSIS — R2689 Other abnormalities of gait and mobility: Secondary | ICD-10-CM | POA: Diagnosis present

## 2016-11-22 DIAGNOSIS — M6281 Muscle weakness (generalized): Secondary | ICD-10-CM | POA: Diagnosis present

## 2016-11-22 NOTE — Therapy (Signed)
[facility] [address] [place], [place], [address] Phone: [phone]   Fax:  [phone]  Physical Therapy Evaluation  Patient Details  Name: [name] MRN: [id number] Date of Birth: [birth year] Referring Provider: Dr. Jovita Gamma  Encounter Date: 11/22/2016      PT End of Session - 11/22/16 1655    Visit Number 1   Number of Visits 10   Date for PT Re-Evaluation 01/31/17   Authorization Type Medicare g-code 10; KX modifier 15 th visit   PT Start Time 1615   PT Stop Time 1650   PT Time Calculation (min) 35 min   Activity Tolerance Patient tolerated treatment well   Behavior During Therapy [facility] for tasks assessed/performed      Past Medical History:  Diagnosis Date  . Arthritis   . Chronic fatigue    and weakness  . Colon polyps   . Coronary artery disease    nonobstructive with 20% OM by cath 2006  . Depression   . Diabetes mellitus without complication ([place])   . Diastolic dysfunction    w Elevated LVEDP  . DJD (degenerative joint disease)    C3/4 Dr Sherwood Gambler  . GERD (gastroesophageal reflux disease)   . H/O hiatal hernia   . High cholesterol   . Hypertension   . Hypoglycemia   . Iron deficiency anemia   . Kidney stones   . Multiple thyroid nodules   . Neurocardiogenic syncope   . Obesity   . Prediabetes 2011 & 2012  . Right knee pain 12/2009   Dr Marlou Sa  . Sleep apnea    CPAP had Sleep Study ordered by Dr. Radford Pax  . Vasovagal syncope     Past Surgical History:  Procedure Laterality Date  . ACHILLES TENDON SURGERY Left 01/23/2013   Procedure: EXCISION PARTIAL BONE TALUS/CALCANEUS, REPAIR RUPTURE ACHILLES TENDON PRIMARY OPEN ;  Surgeon: Ninetta Lights, MD;  Location: [place];  Service: Orthopedics;  Laterality: Left;  . ANTERIOR FUSION CERVICAL SPINE     x2-cervical  . BREAST CYST EXCISION Left   . CARDIAC CATHETERIZATION  ~ 2008   Nonobstructive ASCAD, 20% OM   . CARPAL TUNNEL RELEASE Left   . LITHOTRIPSY    . TONSILLECTOMY    . TOTAL KNEE ARTHROPLASTY Right 11/2010  . TOTAL KNEE ARTHROPLASTY  12/13/2011   Procedure: TOTAL KNEE ARTHROPLASTY;  Surgeon: Ninetta Lights, MD;  Location: [place];  Service: Orthopedics;  Laterality: Left;  DR MURPHY WANTS 90 MINUTES FOR THIS CASE    There were no vitals filed for this visit.       Subjective Assessment - 11/22/16 1615    Subjective Patient fell in 2015 when she slipped on the wet floor and fractured her back. Pain since then. Past 6 months pain has become constant.    Patient Stated Goals get rid of pain, increase strength of back   Currently in Pain? Yes   Pain Score 6    Pain Location Back   Pain Orientation Mid;Lower   Pain Type Chronic pain   Pain Onset More than a month ago   Pain Frequency Intermittent   Aggravating Factors  bending, light housework, loading the dishwasher,    Pain Relieving Factors straighten badk   Multiple Pain Sites No            [facility] PT Assessment - 11/22/16 0001      Assessment   Medical Diagnosis M54.5 Chronic Low  back pain without sciatica   Referring Provider Dr. Jovita Gamma   Onset Date/Surgical Date 05/31/16   Prior Therapy none     Precautions   Precautions None     Restrictions   Weight Bearing Restrictions No     Balance Screen   Has the patient fallen in the past 6 months No   Has the patient had a decrease in activity level because of a fear of falling?  No   Is the patient reluctant to leave their home because of a fear of falling?  No     Home Ecologist residence     Prior Function   Level of Independence Independent   Vocation Retired     Associate Professor   Overall Cognitive Status Within Functional Limits for tasks assessed     Observation/Other Assessments   Focus on Therapeutic Outcomes (FOTO)  46% limitation  goal is 37% limitation     Posture/Postural Control   Posture/Postural Control  Postural limitations   Postural Limitations Rounded Shoulders;Forward head;Decreased lumbar lordosis     ROM / Strength   AROM / PROM / Strength AROM;Strength;PROM     PROM   Overall PROM Comments lumbar ROM is decreased by 25%     Strength   Right Hip Flexion 4/5   Left Hip Flexion 4/5     Flexibility   Soft Tissue Assessment /Muscle Length yes   Hamstrings decreased bilateral   Quadriceps decreased bilateral      Palpation   Palpation comment tightness in lumbar paraspinals; tenderness located in L4-S1 area     Transfers   Transfers Not assessed     Ambulation/Gait   Ambulation/Gait No     Standardized Balance Assessment   Standardized Balance Assessment Berg Balance Test     Berg Balance Test   Sit to Stand Able to stand  independently using hands   Standing Unsupported Able to stand safely 2 minutes   Sitting with Back Unsupported but Feet Supported on Floor or Stool Able to sit safely and securely 2 minutes   Stand to Sit Sits safely with minimal use of hands   Transfers Able to transfer safely, definite need of hands   Standing Unsupported with Eyes Closed Able to stand 10 seconds safely   Standing Ubsupported with Feet Together Able to place feet together independently and stand 1 minute safely   From Standing, Reach Forward with Outstretched Arm Can reach confidently >25 cm (10")   From Standing Position, Pick up Object from Floor Able to pick up shoe safely and easily   From Standing Position, Turn to Look Behind Over each Shoulder Looks behind from both sides and weight shifts well   Turn 360 Degrees Able to turn 360 degrees safely in 4 seconds or less   Standing Unsupported, Alternately Place Feet on Step/Stool Able to stand independently and safely and complete 8 steps in 20 seconds   Standing Unsupported, One Foot in Front Able to place foot tandem independently and hold 30 seconds   Standing on One Leg Tries to lift leg/unable to hold 3 seconds but remains  standing independently   Total Score 51                           PT Education - 11/22/16 1656    Education provided Yes   Education Details discussed with patient that an elliptical may not be appropriate for exercise  Person(s) Educated Patient   Methods Explanation   Comprehension Verbalized understanding          PT Short Term Goals - 11/22/16 1653      PT SHORT TERM GOAL #1   Title independent with initial HEP   Time 4   Period Weeks   Status New     PT SHORT TERM GOAL #2   Title berg balance score >/= 53/56   Time 4   Period Weeks   Status New     PT SHORT TERM GOAL #3   Title pain in lumbar decreased >/= 25% due to increased activity   Time 4   Period Weeks   Status New     PT SHORT TERM GOAL #4   Title understand correct body mechanics with daily activities due to increased activity   Time 4   Period Weeks   Status New           PT Long Term Goals - 11/22/16 1634      PT LONG TERM GOAL #1   Title independent with HEP   Time 8   Period Weeks   Status New     PT LONG TERM GOAL #2   Title pain in lumbar spine decreased >/= 50% due to increased activity    Time 8   Period Weeks   Status New     PT LONG TERM GOAL #3   Title moving in bed with back pain decreased >/= 50% due to improved flexibility   Time 8   Period Weeks   Status New     PT LONG TERM GOAL #4   Title fear of falling while gardening decreased >/= 50% due to improved balance and decrease in back pain   Time 8   Period Weeks   Status New     PT LONG TERM GOAL #5   Title FOTO score </= 37% limitation   Time 8   Period Weeks   Status New     Additional Long Term Goals   Additional Long Term Goals Yes     PT LONG TERM GOAL #6   Title Berg balance >/=55/56   Time 8   Period Weeks   Status New               Plan - 11/22/16 1657    Clinical Impression Statement Patient is a 66 year old female with chronic back pain that began in 2015 but is  not constant in the past 6 months.  Patient reports her pain is 6/10 in lumbar and thoracic. Pain is increased with  bending, standing, walking.  Lumbar ROM is limited by 25%.  Palpable tenderness located in L4-S1 area.  Increased tightness in lumbar paraspinals.  Decreased core and bilateral hip strength.  Berg balance is 51/56 and patient is fearful of falling.  Patient is low complex evaluation due to a stable condition and comorbidities such as cervical fusion and chronic fatique that may impact care provided.  Patient will benfit from skilled therapy to reduce pain , improve balance and improve overall function to improve function.    Rehab Potential Good   Clinical Impairments Affecting Rehab Potential s/p cervical fusion 2x   PT Frequency 2x / week   PT Duration 8 weeks   PT Treatment/Interventions Cryotherapy;Electrical Stimulation;Moist Heat;Traction;Therapeutic activities;Therapeutic exercise;Balance training;Neuromuscular re-education;Patient/family education;Passive range of motion;Dry needling;Energy conservation;Manual techniques   PT Next Visit Plan flexibility exercise for legs and trunk, nustep, core strength, LE  strength   PT Home Exercise Plan correct body mechanincs   Recommended Other Services None   Consulted and Agree with Plan of Care Patient      Patient will benefit from skilled therapeutic intervention in order to improve the following deficits and impairments:  Pain, Decreased strength, Decreased mobility, Decreased balance, Decreased activity tolerance, Decreased range of motion, Increased fascial restricitons, Decreased endurance, Increased muscle spasms  Visit Diagnosis: Muscle weakness (generalized) - Plan: PT plan of care cert/re-cert  Chronic bilateral low back pain without sciatica - Plan: PT plan of care cert/re-cert  Other abnormalities of gait and mobility - Plan: PT plan of care cert/re-cert      G-Codes - 01/77/93 1644    Functional Assessment Tool  Used (Outpatient Only) FOTO score is 46% limitation  goal is 37% limitaion   Functional Limitation Mobility: Walking and moving around   Mobility: Walking and Moving Around Current Status (J0300) At least 40 percent but less than 60 percent impaired, limited or restricted   Mobility: Walking and Moving Around Goal Status (P2330) At least 20 percent but less than 40 percent impaired, limited or restricted       Problem List Patient Active Problem List   Diagnosis Date Noted  . Hyperlipidemia 10/19/2016  . Obstructive sleep apnea 09/01/2013  . Pure hypercholesterolemia 09/01/2013  . Coronary atherosclerosis of native coronary artery 09/01/2013  . Diastolic dysfunction 07/62/2633  . Essential hypertension, benign 09/01/2013  . Left Achilles tendinitis 01/23/2013    Earlie Counts, PT 11/22/16 5:04 PM    [facility] [address], [place], [place], [address] Phone: [phone]   Fax:  [phone]  Name: [name] MRN: [id number] Date of Birth: [birth year]

## 2016-11-23 ENCOUNTER — Ambulatory Visit (HOSPITAL_COMMUNITY)
Admission: RE | Admit: 2016-11-23 | Discharge: 2016-11-23 | Disposition: A | Discharge status: Home / Self Care | Payer: Medicare Other | Source: Ambulatory Visit | Attending: Gastroenterology | Admitting: Gastroenterology

## 2016-11-23 ENCOUNTER — Encounter: Payer: Medicare Other | Admitting: Gastroenterology

## 2016-11-23 DIAGNOSIS — R1310 Dysphagia, unspecified: Secondary | ICD-10-CM | POA: Insufficient documentation

## 2016-11-23 DIAGNOSIS — R7401 Elevation of levels of liver transaminase levels: Secondary | ICD-10-CM

## 2016-11-23 DIAGNOSIS — R740 Nonspecific elevation of levels of transaminase and lactic acid dehydrogenase [LDH]: Secondary | ICD-10-CM | POA: Diagnosis present

## 2016-11-23 DIAGNOSIS — K760 Fatty (change of) liver, not elsewhere classified: Secondary | ICD-10-CM | POA: Insufficient documentation

## 2016-11-23 DIAGNOSIS — N200 Calculus of kidney: Secondary | ICD-10-CM | POA: Insufficient documentation

## 2016-11-23 DIAGNOSIS — K219 Gastro-esophageal reflux disease without esophagitis: Secondary | ICD-10-CM | POA: Insufficient documentation

## 2016-11-24 ENCOUNTER — Other Ambulatory Visit: Payer: Self-pay

## 2016-11-24 DIAGNOSIS — R945 Abnormal results of liver function studies: Secondary | ICD-10-CM

## 2016-11-24 DIAGNOSIS — R7989 Other specified abnormal findings of blood chemistry: Secondary | ICD-10-CM

## 2016-11-27 ENCOUNTER — Ambulatory Visit: Payer: Medicare Other | Admitting: Physical Therapy

## 2016-11-27 ENCOUNTER — Encounter: Payer: Self-pay | Admitting: Physical Therapy

## 2016-11-27 DIAGNOSIS — R2689 Other abnormalities of gait and mobility: Secondary | ICD-10-CM

## 2016-11-27 DIAGNOSIS — M6281 Muscle weakness (generalized): Secondary | ICD-10-CM | POA: Diagnosis not present

## 2016-11-27 DIAGNOSIS — G8929 Other chronic pain: Secondary | ICD-10-CM

## 2016-11-27 DIAGNOSIS — M545 Low back pain, unspecified: Secondary | ICD-10-CM

## 2016-11-27 NOTE — Patient Instructions (Addendum)
Sleeping on Back  Place pillow under knees. A pillow with cervical support and a roll around waist are also helpful. Copyright  VHI. All rights reserved.  Sleeping on Side Place pillow between knees. Use cervical support under neck and a roll around waist as needed. Copyright  VHI. All rights reserved.   Sleeping on Stomach   If this is the only desirable sleeping position, place pillow under lower legs, and under stomach or chest as needed.  Posture - Sitting   Sit upright, head facing forward. Try using a roll to support lower back. Keep shoulders relaxed, and avoid rounded back. Keep hips level with knees. Avoid crossing legs for long periods. Stand to Sit / Sit to Stand   To sit: Bend knees to lower self onto front edge of chair, then scoot back on seat. To stand: Reverse sequence by placing one foot forward, and scoot to front of seat. Use rocking motion to stand up.   Work Height and Reach  Ideal work height is no more than 2 to 4 inches below elbow level when standing, and at elbow level when sitting. Reaching should be limited to arm's length, with elbows slightly bent.  Bending  Bend at hips and knees, not back. Keep feet shoulder-width apart.    Posture - Standing   Good posture is important. Avoid slouching and forward head thrust. Maintain curve in low back and align ears over shoul- ders, hips over ankles.  Alternating Positions   Alternate tasks and change positions frequently to reduce fatigue and muscle tension. Take rest breaks. Computer Work   Position work to face forward. Use proper work and seat height. Keep shoulders back and down, wrists straight, and elbows at right angles. Use chair that provides full back support. Add footrest and lumbar roll as needed.  Getting Into / Out of Car  Lower self onto seat, scoot back, then bring in one leg at a time. Reverse sequence to get out.  Dressing  Lie on back to pull socks or slacks over feet, or sit  and bend leg while keeping back straight.    Housework - Sink  Place one foot on ledge of cabinet under sink when standing at sink for prolonged periods.   Pushing / Pulling  Pushing is preferable to pulling. Keep back in proper alignment, and use leg muscles to do the work.  Deep Squat   Squat and lift with both arms held against upper trunk. Tighten stomach muscles without holding breath. Use smooth movements to avoid jerking.  Avoid Twisting   Avoid twisting or bending back. Pivot around using foot movements, and bend at knees if needed when reaching for articles.  Carrying Luggage   Distribute weight evenly on both sides. Use a cart whenever possible. Do not twist trunk. Move body as a unit.   Lifting Principles .Maintain proper posture and head alignment. .Slide object as close as possible before lifting. .Move obstacles out of the way. .Test before lifting; ask for help if too heavy. .Tighten stomach muscles without holding breath. .Use smooth movements; do not jerk. .Use legs to do the work, and pivot with feet. .Distribute the work load symmetrically and close to the center of trunk. .Push instead of pull whenever possible.   Ask For Help   Ask for help and delegate to others when possible. Coordinate your movements when lifting together, and maintain the low back curve.  Log Roll   Lying on back, bend left knee and place left   arm across chest. Roll all in one movement to the right. Reverse to roll to the left. Always move as one unit. Housework - Sweeping  Use long-handled equipment to avoid stooping.   Housework - Wiping  Position yourself as close as possible to reach work surface. Avoid straining your back.  Laundry - Unloading Wash   To unload small items at bottom of washer, lift leg opposite to arm being used to reach.  Park close to area to be raked. Use arm movements to do the work. Keep back straight and avoid  twisting.     Cart  When reaching into cart with one arm, lift opposite leg to keep back straight.   Getting Into / Out of Bed  Lower self to lie down on one side by raising legs and lowering head at the same time. Use arms to assist moving without twisting. Bend both knees to roll onto back if desired. To sit up, start from lying on side, and use same move-ments in reverse. Housework - Vacuuming  Hold the vacuum with arm held at side. Step back and forth to move it, keeping head up. Avoid twisting.   Laundry - IT consultant so that bending and twisting can be avoided.   Laundry - Unloading Dryer  Squat down to reach into clothes dryer or use a reacher.  Gardening - Weeding / Probation officer or Kneel. Knee pads may be helpful.                    Lower Trunk Rotation Stretch    Initially, start with just rocking side to side to assess what makes your back happy. Keeping back flat and feet together, rotate knees to left side. Hold 3 breaths.  Repeat _3___ times per set. Do __1-2__ sets per session. Do _1-3___ sessions per day.  Supine Knee to Chest    Pillows!!! Lie on back. Gently pull one knee toward chest. Hold 3 breaths.  Repeat _3__ times per session. Do _1-3__ sessions per day.  Copyright  VHI. All rights reserved.   #4  Bend both knees: straighten out one leg only, leaving the other knee bent. Pull your toes back towards your knee cap and reach the heel bone away from you "lengthening/stretching leg long."  Hold stretch for 3-5 seconds. Do 3x on each leg. Do 1-2x day.   http://orth.exer.us/123   Copyright  VHI. All rights reserved.

## 2016-11-27 NOTE — Therapy (Signed)
[facility] [address] [place], [place], [address] Phone: [phone]   Fax:  [phone]  Physical Therapy Treatment  Patient Details  Name: [name] MRN: [id number] Date of Birth: 08-25-1950 Referring Provider: Dr. Jovita Gamma  Encounter Date: 11/27/2016      PT End of Session - 11/27/16 1605    Visit Number 2   Number of Visits 10   Date for PT Re-Evaluation 01/31/17   Authorization Type Medicare g-code 10; KX modifier 15 th visit   PT Start Time 1530   PT Stop Time 1614   PT Time Calculation (min) 44 min   Activity Tolerance Patient tolerated treatment well   Behavior During Therapy [facility] for tasks assessed/performed      Past Medical History:  Diagnosis Date  . Arthritis   . Chronic fatigue    and weakness  . Colon polyps   . Coronary artery disease    nonobstructive with 20% OM by cath 2006  . Depression   . Diabetes mellitus without complication ([place])   . Diastolic dysfunction    w Elevated LVEDP  . DJD (degenerative joint disease)    C3/4 Dr Sherwood Gambler  . GERD (gastroesophageal reflux disease)   . H/O hiatal hernia   . High cholesterol   . Hypertension   . Hypoglycemia   . Iron deficiency anemia   . Kidney stones   . Multiple thyroid nodules   . Neurocardiogenic syncope   . Obesity   . Prediabetes 2011 & 2012  . Right knee pain 12/2009   Dr Marlou Sa  . Sleep apnea    CPAP had Sleep Study ordered by Dr. Radford Pax  . Vasovagal syncope     Past Surgical History:  Procedure Laterality Date  . ACHILLES TENDON SURGERY Left 01/23/2013   Procedure: EXCISION PARTIAL BONE TALUS/CALCANEUS, REPAIR RUPTURE ACHILLES TENDON PRIMARY OPEN ;  Surgeon: Ninetta Lights, MD;  Location: [place];  Service: Orthopedics;  Laterality: Left;  . ANTERIOR FUSION CERVICAL SPINE     x2-cervical  . BREAST CYST EXCISION Left   . CARDIAC CATHETERIZATION  ~ 2008   Nonobstructive ASCAD, 20% OM   . CARPAL TUNNEL RELEASE Left   . LITHOTRIPSY    . TONSILLECTOMY    . TOTAL KNEE ARTHROPLASTY Right 11/2010  . TOTAL KNEE ARTHROPLASTY  12/13/2011   Procedure: TOTAL KNEE ARTHROPLASTY;  Surgeon: Ninetta Lights, MD;  Location: [place];  Service: Orthopedics;  Laterality: Left;  DR MURPHY WANTS 90 MINUTES FOR THIS CASE    There were no vitals filed for this visit.      Subjective Assessment - 11/27/16 1535    Subjective I am sore in my low back today. Rode my recumbent bike yesterday.   Currently in Pain? Yes   Pain Score 4    Pain Location Back   Pain Orientation Right;Left;Lower   Pain Descriptors / Indicators Sore   Aggravating Factors  Bending   Pain Relieving Factors Rest   Multiple Pain Sites No                         [facility] Adult PT Treatment/Exercise - 11/27/16 0001      Moist Heat Therapy   Number Minutes Moist Heat --  Concurrent with TE and education of body mechaincs                PT Education - 11/27/16 1538  Education provided Yes   Education Details Body mechanics, lower trunk rotation, single knee to chest, leg lengthener Bil   Person(s) Educated Patient   Methods Tactile cues;Verbal cues;Handout;Demonstration;Explanation   Comprehension Verbalized understanding;Returned demonstration          PT Short Term Goals - 11/27/16 1603      PT SHORT TERM GOAL #1   Title independent with initial HEP   Time 4   Period Weeks   Status On-going     PT SHORT TERM GOAL #3   Title pain in lumbar decreased >/= 25% due to increased activity   Period Weeks   Status On-going  Just started therapy     PT SHORT TERM GOAL #4   Title understand correct body mechanics with daily activities due to increased activity   Time 4   Period Weeks   Status Achieved           PT Long Term Goals - 11/22/16 1634      PT LONG TERM GOAL #1   Title independent with HEP   Time 8   Period Weeks   Status New     PT LONG TERM GOAL #2   Title  pain in lumbar spine decreased >/= 50% due to increased activity    Time 8   Period Weeks   Status New     PT LONG TERM GOAL #3   Title moving in bed with back pain decreased >/= 50% due to improved flexibility   Time 8   Period Weeks   Status New     PT LONG TERM GOAL #4   Title fear of falling while gardening decreased >/= 50% due to improved balance and decrease in back pain   Time 8   Period Weeks   Status New     PT LONG TERM GOAL #5   Title FOTO score </= 37% limitation   Time 8   Period Weeks   Status New     Additional Long Term Goals   Additional Long Term Goals Yes     PT LONG TERM GOAL #6   Title Berg balance >/=55/56   Time 8   Period Weeks   Status New               Plan - 11/27/16 1600    Clinical Impression Statement Pt was educated today in lumbar protective body mechanics today and initial HEP for LE flexibility. She also expressed interest in returning to pool exercises/ wqater class as she used to do. PTA encouraged her to do so.  Too early for goals.    Rehab Potential Good   Clinical Impairments Affecting Rehab Potential s/p cervical fusion 2x   PT Frequency 2x / week   PT Duration 8 weeks   PT Treatment/Interventions Cryotherapy;Electrical Stimulation;Moist Heat;Traction;Therapeutic activities;Therapeutic exercise;Balance training;Neuromuscular re-education;Patient/family education;Passive range of motion;Dry needling;Energy conservation;Manual techniques   PT Next Visit Plan flexibility exercise for legs and trunk, nustep, core strength, LE strength   Consulted and Agree with Plan of Care Patient      Patient will benefit from skilled therapeutic intervention in order to improve the following deficits and impairments:  Pain, Decreased strength, Decreased mobility, Decreased balance, Decreased activity tolerance, Decreased range of motion, Increased fascial restricitons, Decreased endurance, Increased muscle spasms  Visit Diagnosis: Muscle  weakness (generalized)  Chronic bilateral low back pain without sciatica  Other abnormalities of gait and mobility     Problem List Patient Active Problem List  Diagnosis Date Noted  . Hyperlipidemia 10/19/2016  . Obstructive sleep apnea 09/01/2013  . Pure hypercholesterolemia 09/01/2013  . Coronary atherosclerosis of native coronary artery 09/01/2013  . Diastolic dysfunction 79/09/4095  . Essential hypertension, benign 09/01/2013  . Left Achilles tendinitis 01/23/2013    COCHRAN,JENNIFER, PTA 11/27/2016, 4:16 PM  [facility] [address] [place], [place], [address] Phone: [phone]   Fax:  [phone]  Name: [name] MRN: [id number] Date of Birth: 1951/04/10

## 2016-11-28 DIAGNOSIS — W57XXXA Bitten or stung by nonvenomous insect and other nonvenomous arthropods, initial encounter: Secondary | ICD-10-CM

## 2016-11-28 HISTORY — DX: Bitten or stung by nonvenomous insect and other nonvenomous arthropods, initial encounter: W57.XXXA

## 2016-11-29 ENCOUNTER — Ambulatory Visit: Payer: Medicare Other | Attending: Neurosurgery | Admitting: Physical Therapy

## 2016-11-29 ENCOUNTER — Encounter: Payer: Self-pay | Admitting: Physical Therapy

## 2016-11-29 DIAGNOSIS — M6281 Muscle weakness (generalized): Secondary | ICD-10-CM | POA: Insufficient documentation

## 2016-11-29 DIAGNOSIS — M545 Low back pain, unspecified: Secondary | ICD-10-CM

## 2016-11-29 DIAGNOSIS — R2689 Other abnormalities of gait and mobility: Secondary | ICD-10-CM | POA: Diagnosis present

## 2016-11-29 DIAGNOSIS — G8929 Other chronic pain: Secondary | ICD-10-CM | POA: Diagnosis present

## 2016-11-29 NOTE — Therapy (Signed)
[facility] [address] [place], [place], [address] Phone: [phone]   Fax:  [phone]  Physical Therapy Treatment  Patient Details  Name: [name] MRN: [id number] Date of Birth: [birth year] Referring Provider: Dr. Jovita Gamma  Encounter Date: 11/29/2016      PT End of Session - 11/29/16 1400    Visit Number 3   Number of Visits 10   Date for PT Re-Evaluation 01/31/17   Authorization Type Medicare g-code 10; KX modifier 15 th visit   PT Start Time 1356   PT Stop Time 1440   PT Time Calculation (min) 44 min   Activity Tolerance Patient tolerated treatment well   Behavior During Therapy [facility] for tasks assessed/performed      Past Medical History:  Diagnosis Date  . Arthritis   . Chronic fatigue    and weakness  . Colon polyps   . Coronary artery disease    nonobstructive with 20% OM by cath 2006  . Depression   . Diabetes mellitus without complication ([place])   . Diastolic dysfunction    w Elevated LVEDP  . DJD (degenerative joint disease)    C3/4 Dr Sherwood Gambler  . GERD (gastroesophageal reflux disease)   . H/O hiatal hernia   . High cholesterol   . Hypertension   . Hypoglycemia   . Iron deficiency anemia   . Kidney stones   . Multiple thyroid nodules   . Neurocardiogenic syncope   . Obesity   . Prediabetes 2011 & 2012  . Right knee pain 12/2009   Dr Marlou Sa  . Sleep apnea    CPAP had Sleep Study ordered by Dr. Radford Pax  . Vasovagal syncope     Past Surgical History:  Procedure Laterality Date  . ACHILLES TENDON SURGERY Left 01/23/2013   Procedure: EXCISION PARTIAL BONE TALUS/CALCANEUS, REPAIR RUPTURE ACHILLES TENDON PRIMARY OPEN ;  Surgeon: Ninetta Lights, MD;  Location: [place];  Service: Orthopedics;  Laterality: Left;  . ANTERIOR FUSION CERVICAL SPINE     x2-cervical  . BREAST CYST EXCISION Left   . CARDIAC CATHETERIZATION  ~ 2008   Nonobstructive ASCAD, 20% OM   . CARPAL TUNNEL RELEASE Left   . LITHOTRIPSY    . TONSILLECTOMY    . TOTAL KNEE ARTHROPLASTY Right 11/2010  . TOTAL KNEE ARTHROPLASTY  12/13/2011   Procedure: TOTAL KNEE ARTHROPLASTY;  Surgeon: Ninetta Lights, MD;  Location: [place];  Service: Orthopedics;  Laterality: Left;  DR MURPHY WANTS 90 MINUTES FOR THIS CASE    There were no vitals filed for this visit.      Subjective Assessment - 11/29/16 1359    Subjective Back feeling ok today. Patient reports having pain in tail bone and groin especially with prolonged sitting. Patient is concerned that this could be related to spodilylosis.    Patient Stated Goals get rid of pain, increase strength of back   Currently in Pain? Yes   Pain Score 4    Pain Location Back   Pain Orientation Right;Left;Lower   Pain Descriptors / Indicators Sore   Pain Type Chronic pain   Pain Onset More than a month ago   Pain Frequency Intermittent   Aggravating Factors  Bending   Pain Relieving Factors Rest   Multiple Pain Sites No                         [facility] Adult PT Treatment/Exercise -  11/29/16 0001      Neuro Re-ed    Neuro Re-ed Details  Activation of TA, Glutes, Diaphram     Lumbar Exercises: Stretches   Single Knee to Chest Stretch 2 reps;10 seconds   Lower Trunk Rotation 10 seconds   ITB Stretch 2 reps;10 seconds   Piriformis Stretch 2 reps;10 seconds     Lumbar Exercises: Aerobic   Stationary Bike Nustep L1 x 10 minutes  Therapist present to discuss treatment     Lumbar Exercises: Supine   Ab Set 10 reps  Diapharamic breathing   Glut Set 10 reps  Then added ball squeeze   Bent Knee Raise 20 reps   Large Ball Abdominal Isometric 10 reps                PT Education - 11/29/16 1440    Education provided Yes          PT Short Term Goals - 11/27/16 1603      PT SHORT TERM GOAL #1   Title independent with initial HEP   Time 4   Period Weeks   Status On-going     PT SHORT TERM GOAL #3   Title  pain in lumbar decreased >/= 25% due to increased activity   Period Weeks   Status On-going  Just started therapy     PT SHORT TERM GOAL #4   Title understand correct body mechanics with daily activities due to increased activity   Time 4   Period Weeks   Status Achieved           PT Long Term Goals - 11/22/16 1634      PT LONG TERM GOAL #1   Title independent with HEP   Time 8   Period Weeks   Status New     PT LONG TERM GOAL #2   Title pain in lumbar spine decreased >/= 50% due to increased activity    Time 8   Period Weeks   Status New     PT LONG TERM GOAL #3   Title moving in bed with back pain decreased >/= 50% due to improved flexibility   Time 8   Period Weeks   Status New     PT LONG TERM GOAL #4   Title fear of falling while gardening decreased >/= 50% due to improved balance and decrease in back pain   Time 8   Period Weeks   Status New     PT LONG TERM GOAL #5   Title FOTO score </= 37% limitation   Time 8   Period Weeks   Status New     Additional Long Term Goals   Additional Long Term Goals Yes     PT LONG TERM GOAL #6   Title Berg balance >/=55/56   Time 8   Period Weeks   Status New               Plan - 11/29/16 1448    Clinical Impression Statement Patient reports back feeling ok but is concerned about groin pain. Patient did well with all supine exercises and able to get good contraction in abdominals. Verbal cues to not contract so hard as to cause pain in other areas such as neck or low back. Reviewed body mechanics for squatting and transfering supine to sit. Patient will continue to benefit from skilled therapy for core stability and extremity strengthening for transfers and body mechanics.    Rehab Potential Good  Clinical Impairments Affecting Rehab Potential s/p cervical fusion 2x   PT Frequency 2x / week   PT Duration 8 weeks   PT Treatment/Interventions Cryotherapy;Electrical Stimulation;Moist  Heat;Traction;Therapeutic activities;Therapeutic exercise;Balance training;Neuromuscular re-education;Patient/family education;Passive range of motion;Dry needling;Energy conservation;Manual techniques   PT Next Visit Plan Core exercises and flexibility   Consulted and Agree with Plan of Care Patient      Patient will benefit from skilled therapeutic intervention in order to improve the following deficits and impairments:  Pain, Decreased strength, Decreased mobility, Decreased balance, Decreased activity tolerance, Decreased range of motion, Increased fascial restricitons, Decreased endurance, Increased muscle spasms  Visit Diagnosis: Muscle weakness (generalized)  Chronic bilateral low back pain without sciatica  Other abnormalities of gait and mobility     Problem List Patient Active Problem List   Diagnosis Date Noted  . Hyperlipidemia 10/19/2016  . Obstructive sleep apnea 09/01/2013  . Pure hypercholesterolemia 09/01/2013  . Coronary atherosclerosis of native coronary artery 09/01/2013  . Diastolic dysfunction 97/91/5041  . Essential hypertension, benign 09/01/2013  . Left Achilles tendinitis 01/23/2013    Mikle Bosworth PTA 11/29/2016, 3:13 PM  [facility] [address] [place], [place], [address] Phone: [phone]   Fax:  [phone]  Name: [name] MRN: [id number] Date of Birth: July 16, 1951

## 2016-11-29 NOTE — Patient Instructions (Signed)
Small Ball Marching Supine    Lie supine with small ball under tailbone, knees flexed. Raise one leg a few inches. Hold briefly, return and raise other leg. Keep hips stationary. Do _1__ sets of _10__ repetitions.  *No ball under hips*  Copyright  VHI. All rights reserved.   Adduction: Hip - Knees Together (Hook-Lying)    Lie with hips and knees bent, towel roll between knees. Push knees together. Hold for _3__ seconds. Repeat _10__ times. Do _1__ times a day.   Copyright  VHI. All rights reserved.  Functional Quadriceps: Sit to Stand    Sit on edge of chair, feet flat on floor. Stand upright, extending knees fully. Repeat __10__ times per set. Do __1__ sets per session. Do _1___ sessions per day.  http://orth.exer.us/735   Copyright  VHI. All rights reserved.  Piriformis Stretch - Supine    Pull uninvolved knee across body toward opposite shoulder. Hold slight stretch for _10__ seconds. Repeat with involved leg. Repeat __2_ times. Do _as needed__ times per day.  Copyright  VHI. All rights reserved.    Mikle Bosworth, PTA 11/29/16 2:39 PM  [facility] [address] [place], [place] [address] Phone # [phone] Fax [phone]

## 2016-11-30 ENCOUNTER — Telehealth: Payer: Self-pay | Admitting: Gastroenterology

## 2016-11-30 ENCOUNTER — Ambulatory Visit (INDEPENDENT_AMBULATORY_CARE_PROVIDER_SITE_OTHER): Payer: Medicare Other | Admitting: Gastroenterology

## 2016-11-30 DIAGNOSIS — R945 Abnormal results of liver function studies: Principal | ICD-10-CM

## 2016-11-30 DIAGNOSIS — R7989 Other specified abnormal findings of blood chemistry: Secondary | ICD-10-CM

## 2016-11-30 DIAGNOSIS — Z23 Encounter for immunization: Secondary | ICD-10-CM

## 2016-11-30 NOTE — Telephone Encounter (Signed)
Spoke to patient, I have not called her and all information she has been informed about.

## 2016-12-04 ENCOUNTER — Ambulatory Visit: Payer: Medicare Other | Admitting: Physical Therapy

## 2016-12-04 ENCOUNTER — Encounter: Payer: Medicare Other | Admitting: Physical Therapy

## 2016-12-06 ENCOUNTER — Encounter: Payer: Self-pay | Admitting: Physical Therapy

## 2016-12-06 ENCOUNTER — Ambulatory Visit: Payer: Medicare Other | Admitting: Physical Therapy

## 2016-12-06 DIAGNOSIS — M545 Low back pain, unspecified: Secondary | ICD-10-CM

## 2016-12-06 DIAGNOSIS — R2689 Other abnormalities of gait and mobility: Secondary | ICD-10-CM

## 2016-12-06 DIAGNOSIS — M6281 Muscle weakness (generalized): Secondary | ICD-10-CM | POA: Diagnosis not present

## 2016-12-06 DIAGNOSIS — G8929 Other chronic pain: Secondary | ICD-10-CM

## 2016-12-06 NOTE — Therapy (Signed)
[facility] [address] [place], [place], [address] Phone: [phone]   Fax:  [phone]  Physical Therapy Treatment  Patient Details  Name: [name] MRN: [id number] Date of Birth: Oct 09, 1950 Referring Provider: Dr. Jovita Gamma  Encounter Date: 12/06/2016      PT End of Session - 12/06/16 1408    Visit Number 4   Number of Visits 10   Date for PT Re-Evaluation 01/31/17   Authorization Type Medicare g-code 10; KX modifier 15 th visit   PT Start Time 1401   PT Stop Time 1444   PT Time Calculation (min) 43 min   Activity Tolerance Patient tolerated treatment well   Behavior During Therapy [facility] for tasks assessed/performed      Past Medical History:  Diagnosis Date  . Arthritis   . Chronic fatigue    and weakness  . Colon polyps   . Coronary artery disease    nonobstructive with 20% OM by cath 2006  . Depression   . Diabetes mellitus without complication ([place])   . Diastolic dysfunction    w Elevated LVEDP  . DJD (degenerative joint disease)    C3/4 Dr Sherwood Gambler  . GERD (gastroesophageal reflux disease)   . H/O hiatal hernia   . High cholesterol   . Hypertension   . Hypoglycemia   . Iron deficiency anemia   . Kidney stones   . Multiple thyroid nodules   . Neurocardiogenic syncope   . Obesity   . Prediabetes 2011 & 2012  . Right knee pain 12/2009   Dr Marlou Sa  . Sleep apnea    CPAP had Sleep Study ordered by Dr. Radford Pax  . Vasovagal syncope     Past Surgical History:  Procedure Laterality Date  . ACHILLES TENDON SURGERY Left 01/23/2013   Procedure: EXCISION PARTIAL BONE TALUS/CALCANEUS, REPAIR RUPTURE ACHILLES TENDON PRIMARY OPEN ;  Surgeon: Ninetta Lights, MD;  Location: [place];  Service: Orthopedics;  Laterality: Left;  . ANTERIOR FUSION CERVICAL SPINE     x2-cervical  . BREAST CYST EXCISION Left   . CARDIAC CATHETERIZATION  ~ 2008   Nonobstructive ASCAD, 20% OM   . CARPAL TUNNEL RELEASE Left   . LITHOTRIPSY    . TONSILLECTOMY    . TOTAL KNEE ARTHROPLASTY Right 11/2010  . TOTAL KNEE ARTHROPLASTY  12/13/2011   Procedure: TOTAL KNEE ARTHROPLASTY;  Surgeon: Ninetta Lights, MD;  Location: [place];  Service: Orthopedics;  Laterality: Left;  DR MURPHY WANTS 90 MINUTES FOR THIS CASE    There were no vitals filed for this visit.      Subjective Assessment - 12/06/16 1407    Subjective If I didn't know better I would say I am getting better.    Currently in Pain? No/denies   Multiple Pain Sites No                         [facility] Adult PT Treatment/Exercise - 12/06/16 0001      Lumbar Exercises: Stretches   Single Knee to Chest Stretch 2 reps;20 seconds  with ankle pumps   Lower Trunk Rotation --  20x rocking   Piriformis Stretch 2 reps;20 seconds  PTA helped hold/support opposite knee/leg     Lumbar Exercises: Aerobic   Stationary Bike Nustep L2 x 10 min  PTA monitored pt     Lumbar Exercises: Supine   Ab Set 20 reps;2 seconds  with concurrent  ball squeeze   Glut Set 20 reps;2 seconds  with ball squeeze   Clam --  Bil 10x   Bent Knee Raise 20 reps                  PT Short Term Goals - 12/06/16 1409      PT SHORT TERM GOAL #1   Title independent with initial HEP   Time 4   Period Weeks   Status Achieved     PT SHORT TERM GOAL #3   Title pain in lumbar decreased >/= 25% due to increased activity   Time 4   Period Weeks   Status Achieved  50%           PT Long Term Goals - 12/06/16 1414      PT LONG TERM GOAL #2   Title pain in lumbar spine decreased >/= 50% due to increased activity    Time 8   Period Weeks   Status Achieved  50%               Plan - 12/06/16 1409    Clinical Impression Statement Pt reports her back pain is 50% improved, meeting STG. She is also compliant with her HEP. She reports general mobility seems to be improving.    Rehab Potential Good   Clinical  Impairments Affecting Rehab Potential s/p cervical fusion 2x   PT Frequency 2x / week   PT Duration 8 weeks   PT Treatment/Interventions Cryotherapy;Electrical Stimulation;Moist Heat;Traction;Therapeutic activities;Therapeutic exercise;Balance training;Neuromuscular re-education;Patient/family education;Passive range of motion;Dry needling;Energy conservation;Manual techniques   PT Next Visit Plan Core exercises and flexibility, add to HEP next session   Consulted and Agree with Plan of Care Patient      Patient will benefit from skilled therapeutic intervention in order to improve the following deficits and impairments:  Pain, Decreased strength, Decreased mobility, Decreased balance, Decreased activity tolerance, Decreased range of motion, Increased fascial restricitons, Decreased endurance, Increased muscle spasms  Visit Diagnosis: Muscle weakness (generalized)  Chronic bilateral low back pain without sciatica  Other abnormalities of gait and mobility     Problem List Patient Active Problem List   Diagnosis Date Noted  . Hyperlipidemia 10/19/2016  . Obstructive sleep apnea 09/01/2013  . Pure hypercholesterolemia 09/01/2013  . Coronary atherosclerosis of native coronary artery 09/01/2013  . Diastolic dysfunction 68/06/7516  . Essential hypertension, benign 09/01/2013  . Left Achilles tendinitis 01/23/2013    Priscila Bean, PTA 12/06/2016, 2:44 PM  [facility] [address], [place], [place], [address] Phone: [phone]   Fax:  [phone]  Name: LEITHA HYPPOLITE MRN: [id number] Date of Birth: May 19, 1951

## 2016-12-11 ENCOUNTER — Encounter: Payer: Medicare Other | Admitting: Physical Therapy

## 2016-12-13 ENCOUNTER — Ambulatory Visit: Payer: Medicare Other | Admitting: Physical Therapy

## 2016-12-13 ENCOUNTER — Encounter: Payer: Self-pay | Admitting: Physical Therapy

## 2016-12-13 DIAGNOSIS — M545 Low back pain, unspecified: Secondary | ICD-10-CM

## 2016-12-13 DIAGNOSIS — M6281 Muscle weakness (generalized): Secondary | ICD-10-CM | POA: Diagnosis not present

## 2016-12-13 DIAGNOSIS — R2689 Other abnormalities of gait and mobility: Secondary | ICD-10-CM

## 2016-12-13 DIAGNOSIS — G8929 Other chronic pain: Secondary | ICD-10-CM

## 2016-12-13 NOTE — Patient Instructions (Signed)
#  1  Seated: Yellow band scapular squeezes Sit with your best posture. Lift the lower belly up & in GENTLY Holding yellow band in your palms with elbows bent at 90 degrees by your side. Gently pull the band outwards feeling your shoulder blades squeeze.   Do 2 x 5-10   #2  Same position as above: This time hold LIGHT weights or cans of food in your hands. Perform bicep curls, bending elbows and straightening the elbows. Do 2 sets of 10-15. We used 1# in the clinic, your may PROGRESS to 2#-3# as you feel you can tolerate more wt.   #3  Yellow band straight arm pulls:   Organize your sitting posture as in the previous exercises. This time hold the yellow band at chest level with STRAIGHT arms. Really pull your lower abs in on this one. Pull your arms outward maintaining your excellent posture especially keeping your neck muscles soft.  Do 1-2 sets of 5-10x

## 2016-12-13 NOTE — Therapy (Signed)
[facility] [address] [place], [place], [address] Phone: [phone]   Fax:  [phone]  Physical Therapy Treatment  Patient Details  Name: [name] MRN: [id number] Date of Birth: 08-07-1950 Referring Provider: Dr. Jovita Gamma  Encounter Date: 12/13/2016      PT End of Session - 12/13/16 1404    Visit Number 5   Number of Visits 10   Date for PT Re-Evaluation 01/31/17   Authorization Type Medicare g-code 10; KX modifier 15 th visit   PT Start Time 1402   PT Stop Time 1445   PT Time Calculation (min) 43 min   Activity Tolerance Patient tolerated treatment well   Behavior During Therapy [facility] for tasks assessed/performed      Past Medical History:  Diagnosis Date  . Arthritis   . Chronic fatigue    and weakness  . Colon polyps   . Coronary artery disease    nonobstructive with 20% OM by cath 2006  . Depression   . Diabetes mellitus without complication ([place])   . Diastolic dysfunction    w Elevated LVEDP  . DJD (degenerative joint disease)    C3/4 Dr Sherwood Gambler  . GERD (gastroesophageal reflux disease)   . H/O hiatal hernia   . High cholesterol   . Hypertension   . Hypoglycemia   . Iron deficiency anemia   . Kidney stones   . Multiple thyroid nodules   . Neurocardiogenic syncope   . Obesity   . Prediabetes 2011 & 2012  . Right knee pain 12/2009   Dr Marlou Sa  . Sleep apnea    CPAP had Sleep Study ordered by Dr. Radford Pax  . Vasovagal syncope     Past Surgical History:  Procedure Laterality Date  . ACHILLES TENDON SURGERY Left 01/23/2013   Procedure: EXCISION PARTIAL BONE TALUS/CALCANEUS, REPAIR RUPTURE ACHILLES TENDON PRIMARY OPEN ;  Surgeon: Ninetta Lights, MD;  Location: [place];  Service: Orthopedics;  Laterality: Left;  . ANTERIOR FUSION CERVICAL SPINE     x2-cervical  . BREAST CYST EXCISION Left   . CARDIAC CATHETERIZATION  ~ 2008   Nonobstructive ASCAD, 20% OM   . CARPAL TUNNEL RELEASE Left   . LITHOTRIPSY    . TONSILLECTOMY    . TOTAL KNEE ARTHROPLASTY Right 11/2010  . TOTAL KNEE ARTHROPLASTY  12/13/2011   Procedure: TOTAL KNEE ARTHROPLASTY;  Surgeon: Ninetta Lights, MD;  Location: [place];  Service: Orthopedics;  Laterality: Left;  DR MURPHY WANTS 90 MINUTES FOR THIS CASE    There were no vitals filed for this visit.      Subjective Assessment - 12/13/16 1405    Subjective I am feeling good. Almost paid after I lifted some heavy pots.  I should not have done it.    Patient Stated Goals get rid of pain, increase strength of back   Currently in Pain? No/denies   Multiple Pain Sites No                         [facility] Adult PT Treatment/Exercise - 12/13/16 0001      Therapeutic Activites    Therapeutic Activities --  Ed on sitting posture for card playing that is lumbar suppor     Lumbar Exercises: Aerobic   Stationary Bike L2 x 12 min  PTA present for status update  PT Education - 12/13/16 1411    Education provided Yes   Education Details HEP progression: Seated exs for core & trunk strength, posture ed with sitting activities   Person(s) Educated Patient   Methods Explanation;Demonstration;Tactile cues;Verbal cues;Handout   Comprehension Verbalized understanding;Returned demonstration          PT Short Term Goals - 12/06/16 1409      PT SHORT TERM GOAL #1   Title independent with initial HEP   Time 4   Period Weeks   Status Achieved     PT SHORT TERM GOAL #3   Title pain in lumbar decreased >/= 25% due to increased activity   Time 4   Period Weeks   Status Achieved  50%           PT Long Term Goals - 12/13/16 1444      PT LONG TERM GOAL #2   Title pain in lumbar spine decreased >/= 50% due to increased activity    Time 8   Period Weeks   Status Achieved  65%-75%     PT LONG TERM GOAL #3   Title moving in bed with back pain decreased >/= 50% due to improved  flexibility   Time 8   Period Weeks   Status Achieved  65%     PT LONG TERM GOAL #4   Title fear of falling while gardening decreased >/= 50% due to improved balance and decrease in back pain   Time 8   Period Weeks   Status On-going  Has not done much yet               Plan - 12/13/16 1405    Clinical Impression Statement Pt continues to improve with lessening complaints of back pain. Today we advanced her HEP to move her from primarily supine exercises that wer emore supportive to sitting without support. All loads will be need to be light due to her prior cervical fusions and general low tolerance to "heavier" loads.  This week pt reports her pain with ADLs is 65%-75% improved.    Rehab Potential Good   Clinical Impairments Affecting Rehab Potential s/p cervical fusion 2x   PT Frequency 2x / week   PT Duration 8 weeks   PT Treatment/Interventions Cryotherapy;Electrical Stimulation;Moist Heat;Traction;Therapeutic activities;Therapeutic exercise;Balance training;Neuromuscular re-education;Patient/family education;Passive range of motion;Dry needling;Energy conservation;Manual techniques   PT Next Visit Plan Core exercises and flexibility, BERG for goals in next 1-2 visits   Consulted and Agree with Plan of Care Patient      Patient will benefit from skilled therapeutic intervention in order to improve the following deficits and impairments:  Pain, Decreased strength, Decreased mobility, Decreased balance, Decreased activity tolerance, Decreased range of motion, Increased fascial restricitons, Decreased endurance, Increased muscle spasms  Visit Diagnosis: Muscle weakness (generalized)  Chronic bilateral low back pain without sciatica  Other abnormalities of gait and mobility     Problem List Patient Active Problem List   Diagnosis Date Noted  . Hyperlipidemia 10/19/2016  . Obstructive sleep apnea 09/01/2013  . Pure hypercholesterolemia 09/01/2013  . Coronary  atherosclerosis of native coronary artery 09/01/2013  . Diastolic dysfunction 79/89/2119  . Essential hypertension, benign 09/01/2013  . Left Achilles tendinitis 01/23/2013    Marchell Froman, PTA 12/13/2016, 2:46 PM  [facility] [address], [place], [place], [address] Phone: [phone]   Fax:  [phone]  Name: [name] MRN: [id number] Date of Birth: 07-17-1951

## 2016-12-14 ENCOUNTER — Encounter: Payer: Self-pay | Admitting: Gastroenterology

## 2016-12-14 ENCOUNTER — Ambulatory Visit (AMBULATORY_SURGERY_CENTER): Payer: Medicare Other | Admitting: Gastroenterology

## 2016-12-14 ENCOUNTER — Telehealth: Payer: Self-pay | Admitting: Gastroenterology

## 2016-12-14 VITALS — BP 113/63 | HR 79 | Temp 97.8°F | Resp 13 | Ht 66.0 in | Wt 241.0 lb

## 2016-12-14 DIAGNOSIS — R1319 Other dysphagia: Secondary | ICD-10-CM

## 2016-12-14 DIAGNOSIS — K229 Disease of esophagus, unspecified: Secondary | ICD-10-CM | POA: Diagnosis not present

## 2016-12-14 DIAGNOSIS — R1310 Dysphagia, unspecified: Secondary | ICD-10-CM | POA: Diagnosis present

## 2016-12-14 DIAGNOSIS — K259 Gastric ulcer, unspecified as acute or chronic, without hemorrhage or perforation: Secondary | ICD-10-CM | POA: Diagnosis not present

## 2016-12-14 DIAGNOSIS — K317 Polyp of stomach and duodenum: Secondary | ICD-10-CM

## 2016-12-14 MED ORDER — ESOMEPRAZOLE MAGNESIUM 40 MG PO CPDR: 40.0000 mg | DELAYED_RELEASE_CAPSULE | Freq: Two times a day (BID) | ORAL | 1 refills | Status: DC

## 2016-12-14 MED ORDER — SODIUM CHLORIDE 0.9 % IV SOLN: 500.0000 mL | INTRAVENOUS | Status: DC

## 2016-12-14 NOTE — Progress Notes (Signed)
Pt's states no medical or surgical changes since previsit or office visit. 

## 2016-12-14 NOTE — Op Note (Signed)
[facility] Patient Name: Jalilah Wiltsie Procedure Date: 12/14/2016 9:42 AM MRN: [id number] Endoscopist: Remo Lipps P. Armbruster MD, MD Age: 66 Referring MD:  Date of Birth: 1950-10-13 Gender: Female Account #: [id number] Procedure:                Upper GI endoscopy Indications:              Dysphagia, history of Heartburn Medicines:                Monitored Anesthesia Care Procedure:                Pre-Anesthesia Assessment:                           - Prior to the procedure, a History and Physical                            was performed, and patient medications and                            allergies were reviewed. The patient's tolerance of                            previous anesthesia was also reviewed. The risks                            and benefits of the procedure and the sedation                            options and risks were discussed with the patient.                            All questions were answered, and informed consent                            was obtained. Prior Anticoagulants: The patient has                            taken aspirin, last dose was 1 day prior to                            procedure. ASA Grade Assessment: III - A patient                            with severe systemic disease. After reviewing the                            risks and benefits, the patient was deemed in                            satisfactory condition to undergo the procedure.                           After obtaining informed consent, the endoscope was  passed under direct vision. Throughout the                            procedure, the patient's blood pressure, pulse, and                            oxygen saturations were monitored continuously. The                            Endoscope was introduced through the mouth, and                            advanced to the second part of duodenum. The upper                            GI  endoscopy was accomplished without difficulty.                            The patient tolerated the procedure well. Scope In: Scope Out: Findings:                 Esophagogastric landmarks were identified: the                            Z-line was found at 38 cm, the gastroesophageal                            junction was found at 38 cm and the upper extent of                            the gastric folds was found at 38 cm from the                            incisors.                           The exam of the esophagus was otherwise normal.                           A guidewire was placed and the scope was withdrawn.                            Empiric dilation was performed in the entire                            esophagus with a Savary dilator with mild                            resistance at 17 mm and 18 mm. Relook endoscopy                            showed no mucosal wrents.  Localized mucosal changes were found on the lesser                            curvature of the stomach, showing either adherent                            white plaque versus superificial ulcer. It could                            not be washed off. Biopsies were taken with a cold                            forceps for histology.                           Multiple medium sessile polyps were found in the                            gastric fundus and in the gastric body, most 30mm or                            less in size. The 2 largest polyps (6-37mm) were                            removed with a hot snare as a representative                            sample. Resection and retrieval were complete.                           The exam of the stomach was otherwise normal.                           The duodenal bulb and second portion of the                            duodenum were normal. Complications:            No immediate complications. Estimated blood loss:                             Minimal. Estimated Blood Loss:     Estimated blood loss was minimal. Impression:               - Esophagogastric landmarks identified.                           - Normal esophagus otherwise, empiric dilation to                            8mm Savory performed without appreciable mucosal                            wrent                           -  Adherent white plaque versus superficial                            ulceration along the lesser curvature. Biopsied.                           - Multiple benign appearing gastric polyps. Two                            representative polyps resected and retrieved.                           - Normal duodenal bulb and second portion of the                            duodenum. Recommendation:           - Patient has a contact number available for                            emergencies. The signs and symptoms of potential                            delayed complications were discussed with the                            patient. Return to normal activities tomorrow.                            Written discharge instructions were provided to the                            patient.                           - Resume previous diet.                           - Continue present medications.                           - Increase Nexium to twice daily until results of                            biopsy return                           - No NSAIDs                           - Await pathology results.                           - Await course following empiric dilation Steven P. Armbruster MD, MD 12/14/2016 10:25:36 AM This report has been signed electronically.

## 2016-12-14 NOTE — Patient Instructions (Signed)
YOU HAD AN ENDOSCOPIC PROCEDURE TODAY AT [facility]:   Refer to the procedure report that was given to you for any specific questions about what was found during the examination.  If the procedure report does not answer your questions, please call your gastroenterologist to clarify.  If you requested that your care partner not be given the details of your procedure findings, then the procedure report has been included in a sealed envelope for you to review at your convenience later.  YOU SHOULD EXPECT: Some feelings of bloating in the abdomen. Passage of more gas than usual.  Walking can help get rid of the air that was put into your GI tract during the procedure and reduce the bloating. If you had a lower endoscopy (such as a colonoscopy or flexible sigmoidoscopy) you may notice spotting of blood in your stool or on the toilet paper. If you underwent a bowel prep for your procedure, you may not have a normal bowel movement for a few days.  Please Note:  You might notice some irritation and congestion in your nose or some drainage.  This is from the oxygen used during your procedure.  There is no need for concern and it should clear up in a day or so.  SYMPTOMS TO REPORT IMMEDIATELY:    Following upper endoscopy (EGD)  Vomiting of blood or coffee ground material  New chest pain or pain under the shoulder blades  Painful or persistently difficult swallowing  New shortness of breath  Fever of 100F or higher  Black, tarry-looking stools  For urgent or emergent issues, a gastroenterologist can be reached at any hour by calling [phone].   DIET:  We do recommend a small meal at first, but then you may proceed to your regular diet.  Drink plenty of fluids but you should avoid alcoholic beverages for 24 hours.  ACTIVITY:  You should plan to take it easy for the rest of today and you should NOT DRIVE or use heavy machinery until tomorrow (because of the sedation medicines used  during the test).    FOLLOW UP: Our staff will call the number listed on your records the next business day following your procedure to check on you and address any questions or concerns that you may have regarding the information given to you following your procedure. If we do not reach you, we will leave a message.  However, if you are feeling well and you are not experiencing any problems, there is no need to return our call.  We will assume that you have returned to your regular daily activities without incident.  If any biopsies were taken you will be contacted by phone or by letter within the next 1-3 weeks.  Please call us at [phone] if you have not heard about the biopsies in 3 weeks.   Await for biopsy results  No INSAIDS Increase Nexium to twice daily until results of biopsy return    SIGNATURES/CONFIDENTIALITY: You and/or your care partner have signed paperwork which will be entered into your electronic medical record.  These signatures attest to the fact that that the information above on your After Visit Summary has been reviewed and is understood.  Full responsibility of the confidentiality of this discharge information lies with you and/or your care-partner.

## 2016-12-14 NOTE — Progress Notes (Signed)
To recovery, report to Jones, RN, VSS 

## 2016-12-14 NOTE — Progress Notes (Signed)
Called to room to assist during endoscopic procedure.  Patient ID and intended procedure confirmed with present staff. Received instructions for my participation in the procedure from the performing physician.  

## 2016-12-14 NOTE — Telephone Encounter (Signed)
All questions answered and pt has no further questions at this time

## 2016-12-15 ENCOUNTER — Encounter: Payer: Medicare Other | Admitting: Physical Therapy

## 2016-12-15 ENCOUNTER — Telehealth: Payer: Self-pay | Admitting: *Deleted

## 2016-12-15 NOTE — Telephone Encounter (Signed)
  Follow up Call-  Call back number 12/14/2016  Post procedure Call Back phone  # [phone]  Permission to leave phone message Yes  Some recent data might be hidden     Patient questions:  Do you have a fever, pain , or abdominal swelling? No. Pain Score  0 *  Have you tolerated food without any problems? Yes.    Have you been able to return to your normal activities? Yes.    Do you have any questions about your discharge instructions: Diet   No. Medications  No. Follow up visit  No.  Do you have questions or concerns about your Care? No.Just complained of a little throat "soreness" following the dilation but patient had no problems eating or drinking.  Actions: * If pain score is 4 or above: No action needed, pain <4.

## 2016-12-18 ENCOUNTER — Encounter: Payer: Self-pay | Admitting: Physical Therapy

## 2016-12-18 ENCOUNTER — Ambulatory Visit: Payer: Medicare Other | Admitting: Physical Therapy

## 2016-12-18 DIAGNOSIS — G8929 Other chronic pain: Secondary | ICD-10-CM

## 2016-12-18 DIAGNOSIS — M6281 Muscle weakness (generalized): Secondary | ICD-10-CM

## 2016-12-18 DIAGNOSIS — R2689 Other abnormalities of gait and mobility: Secondary | ICD-10-CM

## 2016-12-18 DIAGNOSIS — M545 Low back pain, unspecified: Secondary | ICD-10-CM

## 2016-12-18 NOTE — Patient Instructions (Addendum)
Resisted Horizontal Abduction: Bilateral    Sit or stand, tubing in both hands, arms out in front. Keeping arms straight, pinch shoulder blades together and stretch arms out. Repeat _10___ times per set. Do __2__ sets per session. Do __3__ sessions per week.  Copyright  VHI. All rights reserved.   External Rotation (Eccentric), (Resistance Band)    Quickly pull band outward with forearm of affected arm. Keep elbows at sides, wrists neutral. Slowly return to start for 3-5 seconds. Use __Yellow___ resistance band. Hint: Use towel roll between elbow and hip. _10__ reps per set, _2__ sets per day, _3__ days per week.  Copyright  VHI. All rights reserved.   Arm Curl    Sit or stand with feet shoulder width apart, arms straight down at sides, palms forward. Inhale, then exhale while slowly curling weights toward shoulders and keeping elbows touching torso. Slowly return to starting position. Repeat _10___ times per set. Do _2___ sets per session. Do __3__ sessions per week. May be done with dumbbells, tubing or resistive band.  Mikle Bosworth, PTA 12/18/16 2:10 PM  [facility] [address], [place], [place] [address] Phone # [phone] Fax [phone]

## 2016-12-18 NOTE — Therapy (Signed)
[facility] [address] [place], [place], [address] Phone: [phone]   Fax:  [phone]  Physical Therapy Treatment  Patient Details  Name: [name] MRN: [id number] Date of Birth: 1950-11-10 Referring Provider: Dr. Jovita Gamma  Encounter Date: 12/18/2016      PT End of Session - 12/18/16 1406    Visit Number 6   Number of Visits 10   Date for PT Re-Evaluation 01/31/17   Authorization Type Medicare g-code 10; KX modifier 15 th visit   PT Start Time 1402   PT Stop Time 1445   PT Time Calculation (min) 43 min   Activity Tolerance Patient tolerated treatment well   Behavior During Therapy [facility] for tasks assessed/performed      Past Medical History:  Diagnosis Date  . Arthritis   . Chronic fatigue    and weakness  . Colon polyps   . Coronary artery disease    nonobstructive with 20% OM by cath 2006  . Depression   . Diabetes mellitus without complication ([place])   . Diastolic dysfunction    w Elevated LVEDP  . DJD (degenerative joint disease)    C3/4 Dr Sherwood Gambler  . GERD (gastroesophageal reflux disease)   . H/O hiatal hernia   . High cholesterol   . Hypertension   . Hypoglycemia   . Iron deficiency anemia   . Kidney stones   . Multiple thyroid nodules   . Neurocardiogenic syncope   . Obesity   . Prediabetes 2011 & 2012  . Right knee pain 12/2009   Dr Marlou Sa  . Sleep apnea    CPAP had Sleep Study ordered by Dr. Radford Pax  . Tick bite 11/2016  . Vasovagal syncope     Past Surgical History:  Procedure Laterality Date  . ACHILLES TENDON SURGERY Left 01/23/2013   Procedure: EXCISION PARTIAL BONE TALUS/CALCANEUS, REPAIR RUPTURE ACHILLES TENDON PRIMARY OPEN ;  Surgeon: Ninetta Lights, MD;  Location: [facility];  Service: Orthopedics;  Laterality: Left;  . ANTERIOR FUSION CERVICAL SPINE     x2-cervical  . BREAST CYST EXCISION Left   . CARDIAC CATHETERIZATION  ~ 2008   Nonobstructive ASCAD, 20% OM  . CARPAL TUNNEL RELEASE Left   . LITHOTRIPSY    . TONSILLECTOMY    . TOTAL KNEE ARTHROPLASTY Right 11/2010  . TOTAL KNEE ARTHROPLASTY  12/13/2011   Procedure: TOTAL KNEE ARTHROPLASTY;  Surgeon: Ninetta Lights, MD;  Location: [facility];  Service: Orthopedics;  Laterality: Left;  DR MURPHY WANTS 90 MINUTES FOR THIS CASE    There were no vitals filed for this visit.      Subjective Assessment - 12/18/16 1405    Subjective Feeling pretty good today. No pain at the moment. I think I have some bursitis in my hip right now.    Patient Stated Goals get rid of pain, increase strength of back   Currently in Pain? No/denies   Pain Score 0-No pain                         [facility] Adult PT Treatment/Exercise - 12/18/16 0001      Lumbar Exercises: Aerobic   Stationary Bike L2 x 12 min  PTA present for status update     Lumbar Exercises: Supine   Large Ball Abdominal Isometric 20 reps  Done iin sitting     Shoulder Exercises: Seated   Extension Strengthening;Both;20 reps   Theraband  Level (Shoulder Extension) Level 1 (Yellow)   Row Strengthening;Both;20 reps;Theraband   Theraband Level (Shoulder Row) Level 1 (Yellow)   Horizontal ABduction Strengthening;20 reps;Theraband   Theraband Level (Shoulder Horizontal ABduction) Level 1 (Yellow)   External Rotation Strengthening;Both;20 reps   Theraband Level (Shoulder External Rotation) Level 1 (Yellow)   Other Seated Exercises Biceps flexion x20 #1                PT Education - 12/18/16 1411    Education provided Yes   Education Details Added images to previous HEP for more clarity   Person(s) Educated Patient   Methods Explanation;Demonstration;Handout   Comprehension Verbalized understanding          PT Short Term Goals - 12/18/16 1406      PT SHORT TERM GOAL #2   Title berg balance score >/= 53/56   Time 4   Period Weeks   Status On-going           PT Long Term Goals -  12/18/16 1407      PT LONG TERM GOAL #1   Title independent with HEP   Time 8   Period Weeks   Status On-going     PT LONG TERM GOAL #4   Title fear of falling while gardening decreased >/= 50% due to improved balance and decrease in back pain   Time 8   Period Weeks   Status On-going     PT LONG TERM GOAL #5   Title FOTO score </= 37% limitation   Time 8   Period Weeks   Status On-going     PT LONG TERM GOAL #6   Title Berg balance >/=55/56   Time 8   Period Weeks   Status On-going               Plan - 12/18/16 1453    Clinical Impression Statement Patient reports no back pain upon begining of treatment. Patient wanting to review home exercises. Therapist able to find images for HEP to help with clarity. Patient able to tolerate all strengthening exercsies well. Verbal cues for abdominal brace for posture. Patient will continue to benefit from skilled therapy for core strength and stability.    Rehab Potential Good   Clinical Impairments Affecting Rehab Potential s/p cervical fusion 2x   PT Frequency 2x / week   PT Duration 8 weeks   PT Treatment/Interventions Cryotherapy;Electrical Stimulation;Moist Heat;Traction;Therapeutic activities;Therapeutic exercise;Balance training;Neuromuscular re-education;Patient/family education;Passive range of motion;Dry needling;Energy conservation;Manual techniques   PT Next Visit Plan Core exercises and flexibility, BERG for goals in next 1-2 visits   Consulted and Agree with Plan of Care Patient      Patient will benefit from skilled therapeutic intervention in order to improve the following deficits and impairments:  Pain, Decreased strength, Decreased mobility, Decreased balance, Decreased activity tolerance, Decreased range of motion, Increased fascial restricitons, Decreased endurance, Increased muscle spasms  Visit Diagnosis: Muscle weakness (generalized)  Chronic bilateral low back pain without sciatica  Other  abnormalities of gait and mobility     Problem List Patient Active Problem List   Diagnosis Date Noted  . Hyperlipidemia 10/19/2016  . Obstructive sleep apnea 09/01/2013  . Pure hypercholesterolemia 09/01/2013  . Coronary atherosclerosis of native coronary artery 09/01/2013  . Diastolic dysfunction 48/18/5631  . Essential hypertension, benign 09/01/2013  . Left Achilles tendinitis 01/23/2013    Jeanie Sewer PTA 12/18/2016, 2:56 PM  [facility] [address] [place],  [place], [address] Phone: [phone]   Fax:  [phone]  Name: [name] MRN: [id number] Date of Birth: 01/03/1951

## 2016-12-19 ENCOUNTER — Other Ambulatory Visit: Payer: Medicare Other

## 2016-12-20 ENCOUNTER — Encounter: Payer: Medicare Other | Admitting: Physical Therapy

## 2016-12-20 ENCOUNTER — Encounter: Payer: Self-pay | Admitting: Gastroenterology

## 2016-12-21 ENCOUNTER — Telehealth: Payer: Self-pay | Admitting: Gastroenterology

## 2016-12-21 ENCOUNTER — Other Ambulatory Visit: Payer: Self-pay

## 2016-12-21 NOTE — Telephone Encounter (Signed)
Spoke to patient, she had numerous questions, answered all. She is also aware that she will be getting a letter in the mail.

## 2016-12-22 ENCOUNTER — Other Ambulatory Visit: Payer: Medicare Other | Admitting: *Deleted

## 2016-12-22 ENCOUNTER — Ambulatory Visit: Payer: Medicare Other | Admitting: Physical Therapy

## 2016-12-22 DIAGNOSIS — E785 Hyperlipidemia, unspecified: Secondary | ICD-10-CM

## 2016-12-26 ENCOUNTER — Ambulatory Visit: Payer: Medicare Other | Admitting: Physical Therapy

## 2016-12-26 ENCOUNTER — Encounter: Payer: Self-pay | Admitting: Physical Therapy

## 2016-12-26 ENCOUNTER — Telehealth: Payer: Self-pay | Admitting: Gastroenterology

## 2016-12-26 DIAGNOSIS — M545 Low back pain, unspecified: Secondary | ICD-10-CM

## 2016-12-26 DIAGNOSIS — M6281 Muscle weakness (generalized): Secondary | ICD-10-CM | POA: Diagnosis not present

## 2016-12-26 DIAGNOSIS — G8929 Other chronic pain: Secondary | ICD-10-CM

## 2016-12-26 DIAGNOSIS — R2689 Other abnormalities of gait and mobility: Secondary | ICD-10-CM

## 2016-12-26 NOTE — Telephone Encounter (Signed)
Patient states that ever since EGD she has noticed an intermittent pain in her LLQ and RUQ of abdomen. She states her bowels are very soft, but she that this has been her normal since starting metformin. Patient again asked about the "white patch" and wondered if her daily 81 mg aspirin could have caused this? Patient is anxious type, tried to reassure her, referred to letter sent to her last week.

## 2016-12-26 NOTE — Therapy (Signed)
[facility] [address] [place], [place], [address] Phone: [phone]   Fax:  [phone]  Physical Therapy Treatment  Patient Details  Name: [name] MRN: [id number] Date of Birth: Jun 19, 1951 Referring Provider: Dr. Jovita Gamma  Encounter Date: 12/26/2016      PT End of Session - 12/26/16 1412    Visit Number 7   Number of Visits 10   Date for PT Re-Evaluation 01/31/17   Authorization Type Medicare g-code 10; KX modifier 15 th visit   PT Start Time 1404   PT Stop Time 1500   PT Time Calculation (min) 56 min   Activity Tolerance Patient tolerated treatment well   Behavior During Therapy [facility] for tasks assessed/performed      Past Medical History:  Diagnosis Date  . Arthritis   . Chronic fatigue    and weakness  . Colon polyps   . Coronary artery disease    nonobstructive with 20% OM by cath 2006  . Depression   . Diabetes mellitus without complication ([place])   . Diastolic dysfunction    w Elevated LVEDP  . DJD (degenerative joint disease)    C3/4 Dr Sherwood Gambler  . GERD (gastroesophageal reflux disease)   . H/O hiatal hernia   . High cholesterol   . Hypertension   . Hypoglycemia   . Iron deficiency anemia   . Kidney stones   . Multiple thyroid nodules   . Neurocardiogenic syncope   . Obesity   . Prediabetes 2011 & 2012  . Right knee pain 12/2009   Dr Marlou Sa  . Sleep apnea    CPAP had Sleep Study ordered by Dr. Radford Pax  . Tick bite 11/2016  . Vasovagal syncope     Past Surgical History:  Procedure Laterality Date  . ACHILLES TENDON SURGERY Left 01/23/2013   Procedure: EXCISION PARTIAL BONE TALUS/CALCANEUS, REPAIR RUPTURE ACHILLES TENDON PRIMARY OPEN ;  Surgeon: Ninetta Lights, MD;  Location: [place];  Service: Orthopedics;  Laterality: Left;  . ANTERIOR FUSION CERVICAL SPINE     x2-cervical  . BREAST CYST EXCISION Left   . CARDIAC CATHETERIZATION  ~ 2008   Nonobstructive ASCAD, 20% OM  . CARPAL TUNNEL RELEASE Left   . LITHOTRIPSY    . TONSILLECTOMY    . TOTAL KNEE ARTHROPLASTY Right 11/2010  . TOTAL KNEE ARTHROPLASTY  12/13/2011   Procedure: TOTAL KNEE ARTHROPLASTY;  Surgeon: Ninetta Lights, MD;  Location: [place];  Service: Orthopedics;  Laterality: Left;  DR MURPHY WANTS 90 MINUTES FOR THIS CASE    There were no vitals filed for this visit.      Subjective Assessment - 12/26/16 1411    Subjective Today is the first day I've felt a little better. The recumbent bike is what I think bothered me since that's the longest I've been on it. I think the bands irritated my neck.    Patient Stated Goals get rid of pain, increase strength of back   Currently in Pain? Yes   Pain Score 4    Pain Location Back   Pain Orientation Right;Left;Lower   Pain Descriptors / Indicators Sore   Pain Type Chronic pain   Pain Onset More than a month ago   Pain Frequency Intermittent                         [facility] Adult PT Treatment/Exercise - 12/26/16 0001  Lumbar Exercises: Stretches   Single Knee to Chest Stretch 2 reps;20 seconds  with ankle pumps   ITB Stretch 2 reps;10 seconds   Piriformis Stretch 2 reps;20 seconds  PTA helped hold/support opposite knee/leg     Lumbar Exercises: Supine   Ab Set 10 reps     Modalities   Modalities Electrical Stimulation;Moist Heat     Moist Heat Therapy   Number Minutes Moist Heat 15 Minutes   Moist Heat Location Lumbar Spine;Cervical     Electrical Stimulation   Electrical Stimulation Location Lumbar paraspinals   Electrical Stimulation Action Pre mod   Electrical Stimulation Parameters 12 ma right; 8 ma left 15 minutes   Electrical Stimulation Goals Pain     Manual Therapy   Manual Therapy Soft tissue mobilization   Manual therapy comments Patient in left sidelying, pillow between knees   Soft tissue mobilization Bil Lumbar paraspinals, SI joint                  PT  Short Term Goals - 12/26/16 1413      PT SHORT TERM GOAL #2   Title berg balance score >/= 53/56   Time 4   Period Weeks   Status On-going           PT Long Term Goals - 12/26/16 1413      PT LONG TERM GOAL #1   Title independent with HEP   Time 8   Period Weeks   Status On-going     PT LONG TERM GOAL #4   Title fear of falling while gardening decreased >/= 50% due to improved balance and decrease in back pain   Time 8   Period Weeks   Status On-going               Plan - 12/26/16 1410    Clinical Impression Statement Patient had increased pain after last session and reports today is the first day of less pain. Focus of treatment today is to continue to decrease pain. Patient able to tolerate all stretches well and reponded well to soft tissue mobilization in low back. Responded well to Estim and modalities as well.  Will slowly return to core strengthening exercsies in supine at next visit to improve core strength and lumbar stability.    Rehab Potential Good   Clinical Impairments Affecting Rehab Potential s/p cervical fusion 2x   PT Frequency 2x / week   PT Duration 8 weeks   PT Treatment/Interventions Cryotherapy;Electrical Stimulation;Moist Heat;Traction;Therapeutic activities;Therapeutic exercise;Balance training;Neuromuscular re-education;Patient/family education;Passive range of motion;Dry needling;Energy conservation;Manual techniques   PT Next Visit Plan Nust step no more than 6 minutes; supine strengthening   Consulted and Agree with Plan of Care Patient      Patient will benefit from skilled therapeutic intervention in order to improve the following deficits and impairments:  Pain, Decreased strength, Decreased mobility, Decreased balance, Decreased activity tolerance, Decreased range of motion, Increased fascial restricitons, Decreased endurance, Increased muscle spasms  Visit Diagnosis: Muscle weakness (generalized)  Chronic bilateral low back pain  without sciatica  Other abnormalities of gait and mobility     Problem List Patient Active Problem List   Diagnosis Date Noted  . Hyperlipidemia 10/19/2016  . Obstructive sleep apnea 09/01/2013  . Pure hypercholesterolemia 09/01/2013  . Coronary atherosclerosis of native coronary artery 09/01/2013  . Diastolic dysfunction 35/36/1443  . Essential hypertension, benign 09/01/2013  . Left Achilles tendinitis 01/23/2013    Jeanie Sewer PTA 12/26/2016, 3:13 PM  [facility] [address], [place], [place], [address] Phone: [phone]   Fax:  [phone]  Name: [name] MRN: [id number] Date of Birth: 1950/12/22

## 2016-12-28 ENCOUNTER — Encounter: Payer: Self-pay | Admitting: Physical Therapy

## 2016-12-28 ENCOUNTER — Ambulatory Visit: Payer: Medicare Other | Admitting: Physical Therapy

## 2016-12-28 DIAGNOSIS — M6281 Muscle weakness (generalized): Secondary | ICD-10-CM

## 2016-12-28 DIAGNOSIS — G8929 Other chronic pain: Secondary | ICD-10-CM

## 2016-12-28 DIAGNOSIS — M545 Low back pain, unspecified: Secondary | ICD-10-CM

## 2016-12-28 DIAGNOSIS — R2689 Other abnormalities of gait and mobility: Secondary | ICD-10-CM

## 2016-12-28 NOTE — Patient Instructions (Addendum)
With Support    Stand on one leg in neutral spine holding support. Hold __5__ seconds. Repeat on other leg. Do _3___ repetitions, __1__ sets.  http://bt.exer.us/34   Copyright  VHI. All rights reserved.  AMBULATION: Side Step    Lift legs up high. Step sideways 8 steps . Repeat in opposite direction. _2__ reps per set, _1__ sets per day, March sideways Copyright  VHI. All rights reserved.   High Stepping in Place (Standing)    Stand with support and feet together. Alternately lift knees as high as possible. Keep torso erect. Repeat __10__ tiTandem Stance    Right foot in front of left, heel touching toe both feet "straight ahead". Stand on Foot Triangle of Support with both feet. Balance in this position _30__ seconds. Do with left foot in front of right.  Copyright  VHI. All rights reserved.  mes, each leg.  Copyright  VHI. All rights reserved.    [facility] [address], [place], [place] [address] Phone # [phone] Fax [phone]

## 2016-12-28 NOTE — Therapy (Signed)
[facility] [address] [place], [place], [address] Phone: [phone]   Fax:  [phone]  Physical Therapy Treatment  Patient Details  Name: [name] MRN: [id number] Date of Birth: 1951/04/10 Referring Provider: Dr. Jovita Gamma  Encounter Date: 12/28/2016      PT End of Session - 12/28/16 1450    Visit Number 8   Number of Visits 10   Date for PT Re-Evaluation 01/31/17   Authorization Type Medicare g-code 10; KX modifier 15 th visit   PT Start Time 1400   PT Stop Time 1505   PT Time Calculation (min) 65 min   Activity Tolerance Patient tolerated treatment well   Behavior During Therapy [facility] for tasks assessed/performed      Past Medical History:  Diagnosis Date  . Arthritis   . Chronic fatigue    and weakness  . Colon polyps   . Coronary artery disease    nonobstructive with 20% OM by cath 2006  . Depression   . Diabetes mellitus without complication ([place])   . Diastolic dysfunction    w Elevated LVEDP  . DJD (degenerative joint disease)    C3/4 Dr Sherwood Gambler  . GERD (gastroesophageal reflux disease)   . H/O hiatal hernia   . High cholesterol   . Hypertension   . Hypoglycemia   . Iron deficiency anemia   . Kidney stones   . Multiple thyroid nodules   . Neurocardiogenic syncope   . Obesity   . Prediabetes 2011 & 2012  . Right knee pain 12/2009   Dr Marlou Sa  . Sleep apnea    CPAP had Sleep Study ordered by Dr. Radford Pax  . Tick bite 11/2016  . Vasovagal syncope     Past Surgical History:  Procedure Laterality Date  . ACHILLES TENDON SURGERY Left 01/23/2013   Procedure: EXCISION PARTIAL BONE TALUS/CALCANEUS, REPAIR RUPTURE ACHILLES TENDON PRIMARY OPEN ;  Surgeon: Ninetta Lights, MD;  Location: [place];  Service: Orthopedics;  Laterality: Left;  . ANTERIOR FUSION CERVICAL SPINE     x2-cervical  . BREAST CYST EXCISION Left   . CARDIAC CATHETERIZATION  ~ 2008   Nonobstructive ASCAD, 20% OM  . CARPAL TUNNEL RELEASE Left   . LITHOTRIPSY    . TONSILLECTOMY    . TOTAL KNEE ARTHROPLASTY Right 11/2010  . TOTAL KNEE ARTHROPLASTY  12/13/2011   Procedure: TOTAL KNEE ARTHROPLASTY;  Surgeon: Ninetta Lights, MD;  Location: [place];  Service: Orthopedics;  Laterality: Left;  DR MURPHY WANTS 90 MINUTES FOR THIS CASE    There were no vitals filed for this visit.      Subjective Assessment - 12/28/16 1410    Subjective The nustep bothers my back.  The bands I used  set off my neck pain.    Patient Stated Goals get rid of pain, increase strength of back   Currently in Pain? Yes   Pain Score 3    Pain Location Back   Pain Orientation Right;Left;Lower   Pain Descriptors / Indicators Sore   Pain Onset More than a month ago   Pain Frequency Intermittent   Aggravating Factors  bending   Pain Relieving Factors rest   Multiple Pain Sites No            [facility] PT Assessment - 12/28/16 0001      PROM   Overall PROM Comments left sidebend decreased by 25% and extension decreased by 255  [facility] Adult PT Treatment/Exercise - 12/28/16 0001      Lumbar Exercises: Stretches   Single Knee to Chest Stretch 2 reps;20 seconds  with ankle pumps   Piriformis Stretch 2 reps;20 seconds  PTA helped hold/support opposite knee/leg     Lumbar Exercises: Standing   Other Standing Lumbar Exercises walk with high step forward in ladder; side step through ladder both ways;    Other Standing Lumbar Exercises stand on one leg 10 sec.      Lumbar Exercises: Seated   Sit to Stand 10 reps   Sit to Stand Limitations no hands, feet hip width apart     Modalities   Modalities Electrical Stimulation;Moist Heat     Moist Heat Therapy   Number Minutes Moist Heat 20 Minutes   Moist Heat Location Lumbar Spine  supine     Electrical Stimulation   Electrical Stimulation Location Lumbar paraspinals   Electrical Stimulation Action IFC    Electrical Stimulation Parameters to patient tolerance, IFC   Electrical Stimulation Goals Pain                PT Education - 12/28/16 1448    Education provided Yes   Education Details Balance exercises   Person(s) Educated Patient   Methods Explanation;Demonstration;Verbal cues;Handout   Comprehension Returned demonstration;Verbalized understanding          PT Short Term Goals - 12/26/16 1413      PT SHORT TERM GOAL #2   Title berg balance score >/= 53/56   Time 4   Period Weeks   Status On-going           PT Long Term Goals - 12/26/16 1413      PT LONG TERM GOAL #1   Title independent with HEP   Time 8   Period Weeks   Status On-going     PT LONG TERM GOAL #4   Title fear of falling while gardening decreased >/= 50% due to improved balance and decrease in back pain   Time 8   Period Weeks   Status On-going               Plan - 12/28/16 1450    Clinical Impression Statement Nustep hurt patient back therefore she does not want to do it.  Patient is able to stand on one foot for longer period of time.  Patient leans to the right with standing activities and onelegged stance activities.  Patient reports her back did well with soft tissue work last visit.  Patient has not met goals this week due to flare-up in lumbar. Patient will benefit from skilled therapy to slowly return to core strengthening exercises.    Rehab Potential Good   Clinical Impairments Affecting Rehab Potential s/p cervical fusion 2x   PT Frequency 2x / week   PT Duration 8 weeks   PT Treatment/Interventions Cryotherapy;Electrical Stimulation;Moist Heat;Traction;Therapeutic activities;Therapeutic exercise;Balance training;Neuromuscular re-education;Patient/family education;Passive range of motion;Dry needling;Energy conservation;Manual techniques   PT Next Visit Plan Balance exercises; no nustep due to increased in back pain, Back stabilization; review shoulder exercises with yellow  band; soft tissue work to lumbar   PT Home Exercise Plan progress as needed   Recommended Other Services sent second attempt for MD to sign the cert on 6/97/9480   Consulted and Agree with Plan of Care Patient      Patient will benefit from skilled therapeutic intervention in order to improve the following deficits and impairments:  Pain, Decreased strength, Decreased mobility,  Decreased balance, Decreased activity tolerance, Decreased range of motion, Increased fascial restricitons, Decreased endurance, Increased muscle spasms  Visit Diagnosis: Muscle weakness (generalized)  Chronic bilateral low back pain without sciatica  Other abnormalities of gait and mobility     Problem List Patient Active Problem List   Diagnosis Date Noted  . Hyperlipidemia 10/19/2016  . Obstructive sleep apnea 09/01/2013  . Pure hypercholesterolemia 09/01/2013  . Coronary atherosclerosis of native coronary artery 09/01/2013  . Diastolic dysfunction 94/50/3888  . Essential hypertension, benign 09/01/2013  . Left Achilles tendinitis 01/23/2013    Earlie Counts, PT 12/28/16 2:57 PM   [facility] [address] [place], [place], [address] Phone: [phone]   Fax:  [phone]  Name: [name] MRN: [id number] Date of Birth: 1951-01-18

## 2016-12-28 NOTE — Telephone Encounter (Signed)
Called the patient back and answered her questions. There was no obvious ulcer on pathology result, I think the white spot in her stomach was something (medication or food) that was adherent to the stomach wall. Reassured her.  Her dysphagia is much better after the dilation. She has some mild fleeting intermittent pains in her RUQ and LLQ - doesn't seem to be related to eating or bowel habits. Prior RUQ US showed no gallstones. If this persists I asked her to see me in clinic for evaluation. She agreed

## 2016-12-29 ENCOUNTER — Other Ambulatory Visit: Payer: Self-pay | Admitting: Cardiology

## 2016-12-29 MED ORDER — LISINOPRIL 5 MG PO TABS: 5.0000 mg | ORAL_TABLET | Freq: Every day | ORAL | 1 refills | Status: DC

## 2016-12-29 MED ORDER — EZETIMIBE 10 MG PO TABS: 10.0000 mg | ORAL_TABLET | Freq: Every day | ORAL | 1 refills | Status: DC

## 2017-01-01 ENCOUNTER — Ambulatory Visit (INDEPENDENT_AMBULATORY_CARE_PROVIDER_SITE_OTHER): Payer: Medicare Other | Admitting: Gastroenterology

## 2017-01-01 ENCOUNTER — Encounter: Payer: Medicare Other | Admitting: Physical Therapy

## 2017-01-01 DIAGNOSIS — Z23 Encounter for immunization: Secondary | ICD-10-CM | POA: Diagnosis not present

## 2017-01-04 ENCOUNTER — Telehealth: Payer: Self-pay | Admitting: Cardiology

## 2017-01-04 NOTE — Telephone Encounter (Signed)
Talked to someone in Lab who said Lanny Hurst collected the blood specimen for this pt on 12/22/2016 and a note was left for Lanny Hurst to follow up on it since they are not in the system. The labs in [facility] still say need to be collected. LIPID and Hepatic Function Panel were ordered on 12/22/2016.

## 2017-01-04 NOTE — Telephone Encounter (Signed)
Called and spoke with pt who claims she had her blood drawn on 12/22/16 and she did not see her results in  [facility]. The order for the labs are in the system but they say they were not collected. Explained the situation to the patient and apologized for any inconvenience. Pt has an appointment for 01/05/17 to have the LIPIDS and Hepatic Function Panel drawn again. Advised pt she would not be charged again for the blood work. Will call her back to follow up. Pt thanked me for my call.

## 2017-01-04 NOTE — Telephone Encounter (Signed)
Follow Up:   Pt would like her lab results from 12-22-16 please.

## 2017-01-05 ENCOUNTER — Other Ambulatory Visit: Payer: Medicare Other

## 2017-01-08 NOTE — Telephone Encounter (Signed)
Patient has rescheduled blood work to 6/12.

## 2017-01-09 ENCOUNTER — Encounter: Payer: Self-pay | Admitting: Physical Therapy

## 2017-01-09 ENCOUNTER — Other Ambulatory Visit: Payer: Medicare Other

## 2017-01-11 ENCOUNTER — Ambulatory Visit: Payer: Medicare Other | Attending: Neurosurgery | Admitting: Physical Therapy

## 2017-01-11 DIAGNOSIS — M545 Low back pain, unspecified: Secondary | ICD-10-CM

## 2017-01-11 DIAGNOSIS — R2689 Other abnormalities of gait and mobility: Secondary | ICD-10-CM

## 2017-01-11 DIAGNOSIS — G8929 Other chronic pain: Secondary | ICD-10-CM

## 2017-01-11 DIAGNOSIS — M6281 Muscle weakness (generalized): Secondary | ICD-10-CM

## 2017-01-11 NOTE — Therapy (Addendum)
[facility] [address], [place], [place], [address] Phone: [phone]   Fax:  [phone]  Physical Therapy Treatment  Patient Details  Name: [name] MRN: [id number] Date of Birth: Apr 24, 1951 Referring Provider: Dr. Jovita Gamma  Encounter Date: 01/11/2017      PT End of Session - 01/11/17 1437    Visit Number 9   Number of Visits 10   Date for PT Re-Evaluation 01/31/17   Authorization Type Medicare g-code 10; KX modifier 15 th visit   PT Start Time 1402   PT Stop Time 1443   PT Time Calculation (min) 41 min   Activity Tolerance Patient tolerated treatment well   Behavior During Therapy [facility] for tasks assessed/performed      Past Medical History:  Diagnosis Date  . Arthritis   . Chronic fatigue    and weakness  . Colon polyps   . Coronary artery disease    nonobstructive with 20% OM by cath 2006  . Depression   . Diabetes mellitus without complication ([place])   . Diastolic dysfunction    w Elevated LVEDP  . DJD (degenerative joint disease)    C3/4 Dr Sherwood Gambler  . GERD (gastroesophageal reflux disease)   . H/O hiatal hernia   . High cholesterol   . Hypertension   . Hypoglycemia   . Iron deficiency anemia   . Kidney stones   . Multiple thyroid nodules   . Neurocardiogenic syncope   . Obesity   . Prediabetes 2011 & 2012  . Right knee pain 12/2009   Dr Marlou Sa  . Sleep apnea    CPAP had Sleep Study ordered by Dr. Radford Pax  . Tick bite 11/2016  . Vasovagal syncope     Past Surgical History:  Procedure Laterality Date  . ACHILLES TENDON SURGERY Left 01/23/2013   Procedure: EXCISION PARTIAL BONE TALUS/CALCANEUS, REPAIR RUPTURE ACHILLES TENDON PRIMARY OPEN ;  Surgeon: Ninetta Lights, MD;  Location: [place];  Service: Orthopedics;  Laterality: Left;  . ANTERIOR FUSION CERVICAL SPINE     x2-cervical  . BREAST CYST EXCISION Left   . CARDIAC CATHETERIZATION  ~ 2008    Nonobstructive ASCAD, 20% OM  . CARPAL TUNNEL RELEASE Left   . LITHOTRIPSY    . TONSILLECTOMY    . TOTAL KNEE ARTHROPLASTY Right 11/2010  . TOTAL KNEE ARTHROPLASTY  12/13/2011   Procedure: TOTAL KNEE ARTHROPLASTY;  Surgeon: Ninetta Lights, MD;  Location: [place];  Service: Orthopedics;  Laterality: Left;  DR MURPHY WANTS 90 MINUTES FOR THIS CASE    There were no vitals filed for this visit.      Subjective Assessment - 01/11/17 1437    Subjective I feel pretty good with the exercises I have been doing.   Patient Stated Goals get rid of pain, increase strength of back   Currently in Pain? No/denies            [facility] PT Assessment - 01/11/17 0001      Berg Balance Test   Sit to Stand Able to stand without using hands and stabilize independently   Standing Unsupported Able to stand safely 2 minutes   Sitting with Back Unsupported but Feet Supported on Floor or Stool Able to sit safely and securely 2 minutes   Stand to Sit Sits safely with minimal use of hands   Transfers Able to transfer safely, minor use of hands   Standing Unsupported with Eyes Closed Able to  stand 10 seconds safely   Standing Ubsupported with Feet Together Able to place feet together independently and stand 1 minute safely   From Standing, Reach Forward with Outstretched Arm Can reach confidently >25 cm (10")   From Standing Position, Pick up Object from [place] to pick up shoe safely and easily   From Standing Position, Turn to Look Behind Over each Shoulder Looks behind from both sides and weight shifts well   Turn 360 Degrees Able to turn 360 degrees safely in 4 seconds or less   Standing Unsupported, Alternately Place Feet on Step/Stool Able to stand independently and safely and complete 8 steps in 20 seconds   Standing Unsupported, One Foot in [place] to place foot tandem independently and hold 30 seconds   Standing on One Leg Able to lift leg independently and hold > 10 seconds   Total Score 56                      [facility] Adult PT Treatment/Exercise - 01/11/17 0001      Lumbar Exercises: Seated   Sit to Stand 10 reps   Sit to Stand Limitations UE abduction yellow band     Lumbar Exercises: Supine   Ab Set 10 reps  pelvic tilts     Manual Therapy   Manual Therapy Soft tissue mobilization   Manual therapy comments Patient in left sidelying, pillow between knees   Soft tissue mobilization Bil Lumbar paraspinals, SI joint, left glutes                  PT Short Term Goals - 01/11/17 1430      PT SHORT TERM GOAL #1   Title independent with initial HEP   Time 4   Period Weeks   Status Achieved     PT SHORT TERM GOAL #2   Title berg balance score >/= 53/56   Baseline 56/56 (01/11/17)   Period Weeks   Status Achieved     PT SHORT TERM GOAL #3   Title pain in lumbar decreased >/= 25% due to increased activity   Time 4   Period Weeks   Status Achieved     PT SHORT TERM GOAL #4   Title understand correct body mechanics with daily activities due to increased activity   Time 4   Period Weeks   Status Achieved           PT Long Term Goals - 01/11/17 1430      PT LONG TERM GOAL #1   Title independent with HEP   Time 8   Period Weeks   Status Achieved     PT LONG TERM GOAL #2   Title pain in lumbar spine decreased >/= 50% due to increased activity    Time 8   Period Weeks   Status Achieved     PT LONG TERM GOAL #3   Title moving in bed with back pain decreased >/= 50% due to improved flexibility   Time 8   Period Weeks   Status Achieved     PT LONG TERM GOAL #4   Title fear of falling while gardening decreased >/= 50% due to improved balance and decrease in back pain   Baseline I hired someone this spring because I didn't want to dig   Time 8   Period Weeks   Status Not Met     PT LONG TERM GOAL #5   Title FOTO score </=  37% limitation   Baseline 45% limited   Time 8   Period Weeks   Status Not Met     PT LONG TERM GOAL #6    Title Berg balance >/=55/56   Baseline 56/56   Time 8   Period Weeks   Status Achieved               Plan - 2017-02-07 1446    Clinical Impression Statement Pt has been feeling better and managing pain at home.  States she feels good doing the exercises at home.  Pt did well performing exercises and may benefit from regular massage for muscle tightness.   Clinical Impairments Affecting Rehab Potential s/p cervical fusion 2x   PT Treatment/Interventions Cryotherapy;Electrical Stimulation;Moist Heat;Traction;Therapeutic activities;Therapeutic exercise;Balance training;Neuromuscular re-education;Patient/family education;Passive range of motion;Dry needling;Energy conservation;Manual techniques   PT Next Visit Plan discharged today   Consulted and Agree with Plan of Care Patient      Patient will benefit from skilled therapeutic intervention in order to improve the following deficits and impairments:  Pain, Decreased strength, Decreased mobility, Decreased balance, Decreased activity tolerance, Decreased range of motion, Increased fascial restricitons, Decreased endurance, Increased muscle spasms  Visit Diagnosis: Muscle weakness (generalized)  Chronic bilateral low back pain without sciatica  Other abnormalities of gait and mobility       G-Codes - Feb 07, 2017 1447    Functional Assessment Tool Used (Outpatient Only) FOTO and Berg   Functional Limitation Mobility: Walking and moving around   Mobility: Walking and Moving Around Current Status [phone]) At least 40 percent but less than 60 percent impaired, limited or restricted   Mobility: Walking and Moving Around Goal Status [phone]) At least 20 percent but less than 40 percent impaired, limited or restricted   Mobility: Walking and Moving Around Discharge Status [phone]) At least 40 percent but less than 60 percent impaired, limited or restricted      Problem List Patient Active Problem List   Diagnosis Date Noted  .  Hyperlipidemia 10/19/2016  . Obstructive sleep apnea 09/01/2013  . Pure hypercholesterolemia 09/01/2013  . Coronary atherosclerosis of native coronary artery 09/01/2013  . Diastolic dysfunction 50/41/3643  . Essential hypertension, benign 09/01/2013  . Left Achilles tendinitis 01/23/2013    Zannie Cove, PT 2017/02/07, 2:55 PM  [facility] [address], [place], [place], [address] Phone: [phone]   Fax:  [phone]  Name: [name] MRN: [id number] Date of Birth: 07-10-1951  PHYSICAL THERAPY DISCHARGE SUMMARY  Visits from Start of Care: 9  Current functional level related to goals / functional outcomes: See goals above   Remaining deficits: See above details   Education / Equipment: HEP  Plan: Patient agrees to discharge.  Patient goals were partially met. Patient is being discharged due to being pleased with the current functional level.  ?????         Google, PT 02-07-2017 3:05 PM

## 2017-01-12 ENCOUNTER — Other Ambulatory Visit: Payer: Self-pay | Admitting: Cardiology

## 2017-01-12 ENCOUNTER — Other Ambulatory Visit: Payer: Medicare Other

## 2017-01-12 DIAGNOSIS — I10 Essential (primary) hypertension: Secondary | ICD-10-CM

## 2017-01-12 DIAGNOSIS — I2510 Atherosclerotic heart disease of native coronary artery without angina pectoris: Secondary | ICD-10-CM

## 2017-01-12 DIAGNOSIS — E7800 Pure hypercholesterolemia, unspecified: Secondary | ICD-10-CM

## 2017-01-12 LAB — LIPID PANEL
CHOL/HDL RATIO: 3.4 ratio (ref 0.0–4.4)
CHOLESTEROL TOTAL: 148 mg/dL (ref 100–199)
HDL: 43 mg/dL (ref >39)
LDL CALC: 73 mg/dL (ref 0–99)
TRIGLYCERIDES: 159 mg/dL — AB (ref 0–149)
VLDL Cholesterol Cal: 32 mg/dL (ref 5–40)

## 2017-01-12 LAB — HEPATIC FUNCTION PANEL
ALBUMIN: 4.2 g/dL (ref 3.6–4.8)
ALT: 39 IU/L — ABNORMAL HIGH (ref 0–32)
AST: 27 IU/L (ref 0–40)
Alkaline Phosphatase: 82 IU/L (ref 39–117)
Bilirubin Total: 0.3 mg/dL (ref 0.0–1.2)
Bilirubin, Direct: 0.1 mg/dL (ref 0.00–0.40)
TOTAL PROTEIN: 6.3 g/dL (ref 6.0–8.5)

## 2017-01-15 ENCOUNTER — Encounter: Payer: Medicare Other | Admitting: Physical Therapy

## 2017-01-15 ENCOUNTER — Telehealth: Payer: Self-pay

## 2017-01-15 ENCOUNTER — Other Ambulatory Visit: Payer: Self-pay

## 2017-01-15 DIAGNOSIS — R1310 Dysphagia, unspecified: Secondary | ICD-10-CM

## 2017-01-15 DIAGNOSIS — E7800 Pure hypercholesterolemia, unspecified: Secondary | ICD-10-CM

## 2017-01-15 DIAGNOSIS — R1319 Other dysphagia: Secondary | ICD-10-CM

## 2017-01-15 MED ORDER — ESOMEPRAZOLE MAGNESIUM 40 MG PO CPDR: 40.0000 mg | DELAYED_RELEASE_CAPSULE | Freq: Two times a day (BID) | ORAL | 3 refills | Status: DC

## 2017-01-15 NOTE — Telephone Encounter (Signed)
Received fax from [facility] on [address] for esomeprazole 40mg  BID  #90 with 3 refills sent

## 2017-01-15 NOTE — Telephone Encounter (Signed)
Informed patient of results and verbal understanding expressed.  Instructed patient to decrease carbs and fatty foods. Recall placed to have labs drawn in 6 months. Patient agrees with treatment plan.

## 2017-01-15 NOTE — Telephone Encounter (Signed)
-----   Message from [name], MD sent at 01/15/2017  7:47 AM EDT ----- TAGS slightly up - needs to decrease carbs and fatty foods - repeat in 6 months

## 2017-01-18 ENCOUNTER — Encounter: Payer: Medicare Other | Admitting: Physical Therapy

## 2017-01-22 ENCOUNTER — Encounter: Payer: Medicare Other | Admitting: Physical Therapy

## 2017-01-25 ENCOUNTER — Encounter: Payer: Medicare Other | Admitting: Physical Therapy

## 2017-02-23 ENCOUNTER — Telehealth: Payer: Self-pay | Admitting: Gastroenterology

## 2017-02-26 NOTE — Telephone Encounter (Signed)
Lab work is due 60m from June 18th. Pt made aware that she can go to the lab M-F between 7:30 and 5:30 to have drawn. Order has already been placed.  Pt also need to know when her next Hep injection is due. It is due 11/3, but that day is a Saturday. Appt made for 06/04/17 at 2 for final StandardTwinrix injection.

## 2017-05-03 ENCOUNTER — Other Ambulatory Visit: Payer: Self-pay

## 2017-05-28 ENCOUNTER — Other Ambulatory Visit: Payer: Self-pay

## 2017-06-04 ENCOUNTER — Ambulatory Visit (INDEPENDENT_AMBULATORY_CARE_PROVIDER_SITE_OTHER): Payer: Medicare Other | Admitting: Gastroenterology

## 2017-06-04 DIAGNOSIS — Z23 Encounter for immunization: Secondary | ICD-10-CM

## 2017-07-17 ENCOUNTER — Other Ambulatory Visit (INDEPENDENT_AMBULATORY_CARE_PROVIDER_SITE_OTHER): Payer: Medicare Other

## 2017-07-17 DIAGNOSIS — R945 Abnormal results of liver function studies: Secondary | ICD-10-CM

## 2017-07-17 DIAGNOSIS — R7989 Other specified abnormal findings of blood chemistry: Secondary | ICD-10-CM

## 2017-07-17 LAB — HEPATIC FUNCTION PANEL
ALBUMIN: 4.1 g/dL (ref 3.5–5.2)
ALK PHOS: 69 U/L (ref 39–117)
ALT: 18 U/L (ref 0–35)
AST: 16 U/L (ref 0–37)
Bilirubin, Direct: 0.1 mg/dL (ref 0.0–0.3)
TOTAL PROTEIN: 6.8 g/dL (ref 6.0–8.3)
Total Bilirubin: 0.5 mg/dL (ref 0.2–1.2)

## 2017-07-18 ENCOUNTER — Ambulatory Visit: Payer: Medicare Other | Admitting: Podiatry

## 2017-07-19 ENCOUNTER — Encounter: Payer: Self-pay | Admitting: Gastroenterology

## 2017-07-27 ENCOUNTER — Telehealth: Payer: Self-pay | Admitting: *Deleted

## 2017-07-27 NOTE — Telephone Encounter (Signed)
Pt left message concerning her toenails but not what she was needing.

## 2017-07-27 NOTE — Telephone Encounter (Signed)
I spoke with pt she has two big ingrown, and has them cut out during a pedicure, and wanted to know if she should go for a pedicure prior to the appt in office. I told pt she should not get a pedicure prior to the appt, it may worsen the problem and become infected or more painful. Pt states understanding.

## 2017-08-07 ENCOUNTER — Other Ambulatory Visit: Payer: Self-pay | Admitting: Family Medicine

## 2017-08-07 ENCOUNTER — Telehealth: Payer: Self-pay | Admitting: Podiatry

## 2017-08-07 DIAGNOSIS — Z1231 Encounter for screening mammogram for malignant neoplasm of breast: Secondary | ICD-10-CM

## 2017-08-07 NOTE — Telephone Encounter (Signed)
I told pt that barring any unseen problem, Dr. March Rummage would perform the ingrown toenail procedure tomorrow, so she should go ahead and begin the clindamycin.

## 2017-08-07 NOTE — Telephone Encounter (Signed)
I'm scheduled to see Dr. March Rummage tomorrow for painful bilateral ingrown toenails. If Dr. March Rummage should do something tomorrow like take part of the nail or all of the nail off do I need to take my clindamycin before I come in as I am diabetic and I've had two new knees put in. Please let me know. My number is [phone] and my appointment is tomorrow so it is urgent that I speak to someone today. Thank you.

## 2017-08-08 ENCOUNTER — Other Ambulatory Visit: Payer: Medicare Other | Admitting: *Deleted

## 2017-08-08 ENCOUNTER — Encounter: Payer: Self-pay | Admitting: Podiatry

## 2017-08-08 ENCOUNTER — Ambulatory Visit: Payer: Medicare Other | Admitting: Podiatry

## 2017-08-08 DIAGNOSIS — E7800 Pure hypercholesterolemia, unspecified: Secondary | ICD-10-CM

## 2017-08-08 DIAGNOSIS — L600 Ingrowing nail: Secondary | ICD-10-CM | POA: Diagnosis not present

## 2017-08-09 LAB — LIPID PANEL
CHOLESTEROL TOTAL: 163 mg/dL (ref 100–199)
Chol/HDL Ratio: 3.4 ratio (ref 0.0–4.4)
HDL: 48 mg/dL (ref >39)
LDL Calculated: 85 mg/dL (ref 0–99)
TRIGLYCERIDES: 148 mg/dL (ref 0–149)
VLDL Cholesterol Cal: 30 mg/dL (ref 5–40)

## 2017-08-09 NOTE — Progress Notes (Deleted)
Cardiology Office Note:    Date:  08/09/2017   ID:  [name], DOB Jul 05, 1951, MRN [id number]  PCP:  Kathyrn Lass, MD  Cardiologist:  No primary care provider on file.    Referring MD: Kathyrn Lass, MD   No chief complaint on file.   History of Present Illness:    [name] is a 67 y.o. female with a hx of with a history of nonobstructive ASCAD with 20% OM, HTN, dyslipidemia, and OSA on CPAP.   Last nuclear stress test 01/2015 showed normal LVF with no ischemia. This was done for atypical CP and ultimately was felt to be due to fibrocystic breast disease.   She is here today for followup and is doing well.  She denies any chest pain or pressure, SOB, DOE, PND, orthopnea, LE edema, dizziness, palpitations or syncope. She is compliant with her meds and is tolerating meds with no SE.    She is doing well with her CPAP device.  She tolerates the mask and feels the pressure is adequate.  Since going on CPAP she feels rested in the am and has no significant daytime sleepiness.  She denies any significant mouth or nasal dryness or nasal congestion.  SHe does not think that he snores.         Past Medical History:  Diagnosis Date  . Arthritis   . Chronic fatigue    and weakness  . Colon polyps   . Coronary artery disease    nonobstructive with 20% OM by cath 2006  . Depression   . Diabetes mellitus without complication ([place])   . Diastolic dysfunction    w Elevated LVEDP  . DJD (degenerative joint disease)    C3/4 Dr Sherwood Gambler  . GERD (gastroesophageal reflux disease)   . H/O hiatal hernia   . High cholesterol   . Hypertension   . Hypoglycemia   . Iron deficiency anemia   . Kidney stones   . Multiple thyroid nodules   . Neurocardiogenic syncope   . Obesity   . Prediabetes 2011 & 2012  . Right knee pain 12/2009   Dr Marlou Sa  . Sleep apnea    CPAP had Sleep Study ordered by Dr. Radford Pax  . Tick bite 11/2016  . Vasovagal syncope     Past Surgical History:    Procedure Laterality Date  . ACHILLES TENDON SURGERY Left 01/23/2013   Procedure: EXCISION PARTIAL BONE TALUS/CALCANEUS, REPAIR RUPTURE ACHILLES TENDON PRIMARY OPEN ;  Surgeon: Ninetta Lights, MD;  Location: [place];  Service: Orthopedics;  Laterality: Left;  . ANTERIOR FUSION CERVICAL SPINE     x2-cervical  . BREAST CYST EXCISION Left   . CARDIAC CATHETERIZATION  ~ 2008   Nonobstructive ASCAD, 20% OM  . CARPAL TUNNEL RELEASE Left   . LITHOTRIPSY    . TONSILLECTOMY    . TOTAL KNEE ARTHROPLASTY Right 11/2010  . TOTAL KNEE ARTHROPLASTY  12/13/2011   Procedure: TOTAL KNEE ARTHROPLASTY;  Surgeon: Ninetta Lights, MD;  Location: [place];  Service: Orthopedics;  Laterality: Left;  DR MURPHY WANTS 90 MINUTES FOR THIS CASE    Current Medications: No outpatient medications have been marked as taking for the 08/10/17 encounter (Appointment) with [name], MD.   Current Facility-Administered Medications for the 08/10/17 encounter (Appointment) with [name], MD  Medication  . 0.9 %  sodium chloride infusion     Allergies:   Guaifenesin & derivatives; Morphine and related; Penicillins; Tramadol; Hydrocodone-acetaminophen;  and Oxycodone hcl   Social History   Socioeconomic History  . Marital status: Single    Spouse name: Not on file  . Number of children: 0  . Years of education: Not on file  . Highest education level: Not on file  Social Needs  . Financial resource strain: Not on file  . Food insecurity - worry: Not on file  . Food insecurity - inability: Not on file  . Transportation needs - medical: Not on file  . Transportation needs - non-medical: Not on file  Occupational History  . Occupation: retired  Tobacco Use  . Smoking status: Never Smoker  . Smokeless tobacco: Never Used  Substance and Sexual Activity  . Alcohol use: Yes    Alcohol/week: 3.6 oz    Types: 6 Glasses of wine per week    Comment: couple times per week.  . Drug use: No  .  Sexual activity: No  Other Topics Concern  . Not on file  Social History Narrative  . Not on file     Family History: The patient's family history includes Breast cancer in her sister; CAD in her father; CVA in her mother; Cancer in her paternal aunt; Cancer - Prostate in her father; Colon cancer in her other; Crohn's disease in her paternal aunt; Diverticulosis in her father, mother, and sister; Heart disease in her father and paternal grandfather; Hypertension in her sister; Kidney disease in her mother.  ROS:   Please see the history of present illness.    ROS  All other systems reviewed and negative.   EKGs/Labs/Other Studies Reviewed:    The following studies were reviewed today: CPAP download  EKG:  EKG is not ordered today.    Recent Labs: 08/22/2016: BUN 14; Creatinine, Ser 0.75; Potassium 4.3; Sodium 141 11/14/2016: Hemoglobin 12.5; Platelets 259.0 07/17/2017: ALT 18   Recent Lipid Panel    Component Value Date/Time   CHOL 163 08/08/2017 1356   TRIG 148 08/08/2017 1356   HDL 48 08/08/2017 1356   CHOLHDL 3.4 08/08/2017 1356   CHOLHDL 3.9 08/05/2015 1149   VLDL 34 (H) 08/05/2015 1149   LDLCALC 85 08/08/2017 1356   LDLDIRECT 93.3 09/17/2013 1307    Physical Exam:    VS:  There were no vitals taken for this visit.    Wt Readings from Last 3 Encounters:  12/14/16 241 lb (109.3 kg)  11/14/16 241 lb 4 oz (109.4 kg)  08/24/16 246 lb 12.8 oz (111.9 kg)     GEN:  Well nourished, well developed in no acute distress HEENT: Normal NECK: No JVD; No carotid bruits LYMPHATICS: No lymphadenopathy CARDIAC: RRR, no murmurs, rubs, gallops RESPIRATORY:  Clear to auscultation without rales, wheezing or rhonchi  ABDOMEN: Soft, non-tender, non-distended MUSCULOSKELETAL:  No edema; No deformity  SKIN: Warm and dry NEUROLOGIC:  Alert and oriented x 3 PSYCHIATRIC:  Normal affect   ASSESSMENT:    1. Atherosclerosis of native coronary artery of native heart without angina  pectoris   2. Essential hypertension, benign   3. Obstructive sleep apnea   4. Pure hypercholesterolemia    PLAN:    In order of problems listed above:  1.  Nonobstructive ASCAD - cath showed 20% OM.  She denies any anginal CP.  She has intermittent chronic noncardiac pain felt secondary to fibrocystic breast disease. She will continue on ASA 81mg  daily and statin.   2.  HTN - her BP is well controlled on exam today.  She will continue on  Lisinopril 5mg  daily.    3.  OSA - the patient is tolerating PAP therapy well without any problems. The PAP download was reviewed today and showed an AHI of ***/hr on *** cm H2O with ***% compliance in using more than 4 hours nightly.  The patient has been using and benefiting from PAP use and will continue to benefit from therapy.   4.  Hyperlipidemia with LDL goal < 70.  She recently had lipids done with LDL at 85.  She is instructed to increase atorvastatin to 40mg  daily and repeat FLP and ALT in 6 weeks.      Medication Adjustments/Labs and Tests Ordered: Current medicines are reviewed at length with the patient today.  Concerns regarding medicines are outlined above.  No orders of the defined types were placed in this encounter.  No orders of the defined types were placed in this encounter.   Signed, Fransico Him, MD  08/09/2017 4:21 PM    [place]

## 2017-08-10 ENCOUNTER — Telehealth: Payer: Self-pay

## 2017-08-10 ENCOUNTER — Ambulatory Visit: Payer: Medicare Other | Admitting: Cardiology

## 2017-08-10 DIAGNOSIS — E785 Hyperlipidemia, unspecified: Secondary | ICD-10-CM

## 2017-08-10 MED ORDER — ATORVASTATIN CALCIUM 40 MG PO TABS: 40.0000 mg | ORAL_TABLET | Freq: Every day | ORAL | 3 refills | Status: DC

## 2017-08-10 NOTE — Telephone Encounter (Signed)
Notes recorded by Teressa Senter, RN on 08/10/2017 at 12:58 PM EST Patient made aware of results. Pt instructed to increase atorvastatin to 40 mg once a day, prescription sent to pharmacy and repeat FLP and ALT scheduled for 10/01/17. Patient in agreement with plan, verbalized understanding and thanked me for the call.    Notes recorded by [name], MD on 08/09/2017 at 1:09 PM EST LDL not at goal - increase atorvastatin to 40mg  daily and repeat FLP and ALT in 6 weeks

## 2017-08-14 ENCOUNTER — Telehealth: Payer: Self-pay | Admitting: Podiatry

## 2017-08-14 ENCOUNTER — Other Ambulatory Visit: Payer: Self-pay | Admitting: Cardiology

## 2017-08-14 NOTE — Telephone Encounter (Signed)
I saw Dr. March Rummage last week. I had ingrown's on both great toenails and he cut them back. Now my left great toe is hurting because he cut it so low. I was wondering if there is anything I can do for the pain? If you would call me on my home number [phone]. Thank you.

## 2017-08-14 NOTE — Telephone Encounter (Signed)
I told pt to begin epsom salt soaks 1/4 to 1/2 Cup epsom in warm water daily and cover the area with light coated neosporin bandaid until the nails grow out and if the area worsens call for an appt, check in Thursday or Friday to let me know how they are. Pt states the area is a little pink, but get sore each time they are cut, I told pt that she may want to discuss a more permanent procedure if pain continues.

## 2017-08-24 ENCOUNTER — Telehealth: Payer: Self-pay | Admitting: Cardiology

## 2017-08-24 NOTE — Telephone Encounter (Signed)
Informed patient that she has a scheduled annual OV with Dr. Radford Pax on 09/14/17. Explained since Lipitor was increased on 08/10/17. We will repeat labs as scheduled on 10/01/17 to re-evaluate medication management based off lab results. Patient verbalized understanding and thanked me for the call.

## 2017-08-24 NOTE — Telephone Encounter (Signed)
New message   Pt says she thought she was suppose to have blood work done before her appt with [facility] on 2/15 but she isnt schedule until 3/4 for her fasting lipid. She also had an increase in lipitor Please call

## 2017-08-29 ENCOUNTER — Other Ambulatory Visit: Payer: Self-pay | Admitting: Cardiology

## 2017-08-30 NOTE — Progress Notes (Signed)
  Subjective:  Patient ID: [name], female    DOB: 10-30-1950,  MRN: [id number]  Chief Complaint  Patient presents with  . Nail Problem    i have some toenails that are long and i do go and get pedicures   67 y.o. female returns for the above complaint.  Reports elongated toenails and states that her big toenails are painful and tend ingrowing.  States that her pedicure done to rule out.  Objective:  There were no vitals filed for this visit. General AA&O x3. Normal mood and affect.  Vascular Pedal pulses palpable.  Neurologic Epicritic sensation grossly intact.  Dermatologic No open lesions. Skin normal texture and turgor. Bilateral hallux nails ingrowing of both nail borders without erythema or drainage  Orthopedic: No pain to palpation either foot.   Assessment & Plan:  Patient was evaluated and treated and all questions answered.  Ingrowing nails bilateral great toenails -No evidence of paronychia -Nails debrided and slant back fashion.  Lesser nails trimmed -Advised to soak as needed  No Follow-up on file.

## 2017-09-04 ENCOUNTER — Ambulatory Visit
Admission: RE | Admit: 2017-09-04 | Discharge: 2017-09-04 | Disposition: A | Discharge status: Home / Self Care | Payer: Medicare Other | Source: Ambulatory Visit | Attending: Family Medicine | Admitting: Family Medicine

## 2017-09-04 DIAGNOSIS — Z1231 Encounter for screening mammogram for malignant neoplasm of breast: Secondary | ICD-10-CM

## 2017-09-05 ENCOUNTER — Other Ambulatory Visit: Payer: Self-pay | Admitting: Family Medicine

## 2017-09-05 DIAGNOSIS — N63 Unspecified lump in unspecified breast: Secondary | ICD-10-CM

## 2017-09-13 ENCOUNTER — Other Ambulatory Visit: Payer: Medicare Other

## 2017-09-14 ENCOUNTER — Ambulatory Visit: Payer: Medicare Other | Admitting: Cardiology

## 2017-09-14 ENCOUNTER — Encounter: Payer: Self-pay | Admitting: Cardiology

## 2017-09-14 VITALS — BP 122/64 | HR 69 | Ht 66.0 in | Wt 222.0 lb

## 2017-09-14 DIAGNOSIS — E7800 Pure hypercholesterolemia, unspecified: Secondary | ICD-10-CM

## 2017-09-14 DIAGNOSIS — I10 Essential (primary) hypertension: Secondary | ICD-10-CM

## 2017-09-14 DIAGNOSIS — I2510 Atherosclerotic heart disease of native coronary artery without angina pectoris: Secondary | ICD-10-CM | POA: Diagnosis not present

## 2017-09-14 DIAGNOSIS — G4733 Obstructive sleep apnea (adult) (pediatric): Secondary | ICD-10-CM | POA: Diagnosis not present

## 2017-09-14 NOTE — Patient Instructions (Signed)
Medication Instructions:  Your physician recommends that you continue on your current medications as directed. Please refer to the Current Medication list given to you today.  Labwork: None Ordered   Testing/Procedures: None Ordered   Follow-Up: Your physician recommends that you schedule a follow-up appointment in: 10 weeks with Dr. Radford Pax.   Any Other Special Instructions Will Be Listed Below (If Applicable).  CPAP orders have been placed. You will receive a call from the home health agency regarding setting up equipment. If you do not receive a call within the next week give Gae Bon, CPAP assistant a call at [phone].   Thank you for choosing [facility], RN  [phone]    If you need a refill on your cardiac medications before your next appointment, please call your pharmacy.

## 2017-09-14 NOTE — Progress Notes (Signed)
Cardiology Office Note:    Date:  09/14/2017   ID:  [name], DOB 01-Oct-1950, MRN [id number]  PCP:  Kathyrn Lass, MD  Cardiologist:  No primary care provider on file.    Referring MD: Kathyrn Lass, MD   Chief Complaint  Patient presents with  . Coronary Artery Disease  . Hypertension  . Hyperlipidemia  . Sleep Apnea    History of Present Illness:    [name] is a 67 y.o. female with a hx of nonobstructive ASCAD with 20% OM, HTN, dyslipidemia, and OSA on CPAP .  Last nuclear stress test 01/2015 showed normal LVF with no ischemia. This was done for atypical CP and ultimately was felt to be due to fibrocystic breast disease.  She has chronic CP that is a pin pricking sensation with several nuclear stress tests that were normal.  She has a history of elevated LFTs due to fatty liver but LFTs have since normalized after weight loss.   She is here today for followup and is doing well.  She denies any anginal chest pain or pressure, PND, orthopnea, LE edema, dizziness, palpitations or syncope. She has chronic DOE going up stairs that is more prominent when she stops exercising.  She has chronic pin pricking sensation in her breasts that is unchanged.  She is compliant with her meds and is tolerating meds with no SE.  She is doing well with her CPAP device.  She tolerates the mask and feels the pressure is adequate.  Since going on CPAP she feels rested in the am and has no significant daytime sleepiness.  She denies any significant mouth or nasal dryness or nasal congestion.  She does not think that he snores.    Past Medical History:  Diagnosis Date  . Arthritis   . Chronic fatigue    and weakness  . Colon polyps   . Coronary artery disease    nonobstructive with 20% OM by cath 2006  . Depression   . Diabetes mellitus without complication ([place])   . Diastolic dysfunction    w Elevated LVEDP  . DJD (degenerative joint disease)    C3/4 Dr Sherwood Gambler  . GERD  (gastroesophageal reflux disease)   . H/O hiatal hernia   . High cholesterol   . Hypertension   . Hypoglycemia   . Iron deficiency anemia   . Kidney stones   . Multiple thyroid nodules   . Neurocardiogenic syncope   . Obesity   . Prediabetes 2011 & 2012  . Right knee pain 12/2009   Dr Marlou Sa  . Sleep apnea    CPAP had Sleep Study ordered by Dr. Radford Pax  . Tick bite 11/2016  . Vasovagal syncope     Past Surgical History:  Procedure Laterality Date  . ACHILLES TENDON SURGERY Left 01/23/2013   Procedure: EXCISION PARTIAL BONE TALUS/CALCANEUS, REPAIR RUPTURE ACHILLES TENDON PRIMARY OPEN ;  Surgeon: Ninetta Lights, MD;  Location: [facility];  Service: Orthopedics;  Laterality: Left;  . ANTERIOR FUSION CERVICAL SPINE     x2-cervical  . BREAST CYST EXCISION Left   . CARDIAC CATHETERIZATION  ~ 2008   Nonobstructive ASCAD, 20% OM  . CARPAL TUNNEL RELEASE Left   . LITHOTRIPSY    . TONSILLECTOMY    . TOTAL KNEE ARTHROPLASTY Right 11/2010  . TOTAL KNEE ARTHROPLASTY  12/13/2011   Procedure: TOTAL KNEE ARTHROPLASTY;  Surgeon: Ninetta Lights, MD;  Location: [facility];  Service: Orthopedics;  Laterality: Left;  DR MURPHY WANTS 90 MINUTES FOR THIS CASE    Current Medications: Current Meds  Medication Sig  . ALPRAZolam (XANAX) 1 MG tablet Take 1 mg by mouth at bedtime.  Marland Kitchen ammonium lactate (LAC-HYDRIN) 12 % cream Apply topically as needed for dry skin.  Marland Kitchen aspirin 81 MG tablet Take 81 mg by mouth daily.  Marland Kitchen atorvastatin (LIPITOR) 40 MG tablet Take 1 tablet (40 mg total) by mouth daily.  Marland Kitchen buPROPion (WELLBUTRIN XL) 150 MG 24 hr tablet TAKE 1 TAB EVERY OTHER MORNING FOR ABOUT 10 DAYS THEN INCREASE TO 1 TAB IN THE MORNIN WITH FOOD  . desvenlafaxine (PRISTIQ) 50 MG 24 hr tablet Take 50 mg by mouth daily.  Marland Kitchen esomeprazole (NEXIUM) 40 MG capsule Take 1 capsule (40 mg total) by mouth 2 (two) times daily before a meal.  . ezetimibe (ZETIA) 10 MG tablet TAKE 1 TABLET BY MOUTH EVERY DAY  .  ibuprofen (ADVIL,MOTRIN) 200 MG tablet Take 400 mg by mouth as needed (pain).   Marland Kitchen lisinopril (PRINIVIL,ZESTRIL) 5 MG tablet Take 1 tablet (5 mg total) by mouth daily.  . metFORMIN (GLUCOPHAGE) 500 MG tablet Take 500 mg by mouth every evening.   . Omega-3 Fatty Acids (FISH OIL) 1200 MG CAPS Take 3 capsules by mouth daily.  . traZODone (DESYREL) 50 MG tablet Take 100 mg by mouth at bedtime.    Current Facility-Administered Medications for the 09/14/17 encounter (Office Visit) with [name], MD  Medication  . 0.9 %  sodium chloride infusion     Allergies:   Guaifenesin & derivatives; Morphine and related; Penicillins; Tramadol; Hydrocodone-acetaminophen; and Oxycodone hcl   Social History   Socioeconomic History  . Marital status: Single    Spouse name: None  . Number of children: 0  . Years of education: None  . Highest education level: None  Social Needs  . Financial resource strain: None  . Food insecurity - worry: None  . Food insecurity - inability: None  . Transportation needs - medical: None  . Transportation needs - non-medical: None  Occupational History  . Occupation: retired  Tobacco Use  . Smoking status: Never Smoker  . Smokeless tobacco: Never Used  Substance and Sexual Activity  . Alcohol use: Yes    Alcohol/week: 3.6 oz    Types: 6 Glasses of wine per week    Comment: couple times per week.  . Drug use: No  . Sexual activity: No  Other Topics Concern  . None  Social History Narrative  . None     Family History: The patient's family history includes Breast cancer in her sister; CAD in her father; CVA in her mother; Cancer in her paternal aunt; Cancer - Prostate in her father; Colon cancer in her other; Crohn's disease in her paternal aunt; Diverticulosis in her father, mother, and sister; Heart disease in her father and paternal grandfather; Hypertension in her sister; Kidney disease in her mother.  ROS:   Please see the history of present illness.     ROS  All other systems reviewed and negative.   EKGs/Labs/Other Studies Reviewed:    The following studies were reviewed today: CPAP download  EKG:  EKG is not ordered today.  Recent Labs: 11/14/2016: Hemoglobin 12.5; Platelets 259.0 07/17/2017: ALT 18   Recent Lipid Panel    Component Value Date/Time   CHOL 163 08/08/2017 1356   TRIG 148 08/08/2017 1356   HDL 48 08/08/2017 1356   CHOLHDL 3.4 08/08/2017 1356   CHOLHDL  3.9 08/05/2015 1149   VLDL 34 (H) 08/05/2015 1149   LDLCALC 85 08/08/2017 1356   LDLDIRECT 93.3 09/17/2013 1307    Physical Exam:    VS:  BP 122/64   Pulse 69   Ht 5\' 6"  (1.676 m)   Wt 222 lb (100.7 kg)   SpO2 97%   BMI 35.83 kg/m     Wt Readings from Last 3 Encounters:  09/14/17 222 lb (100.7 kg)  12/14/16 241 lb (109.3 kg)  11/14/16 241 lb 4 oz (109.4 kg)     GEN:  Well nourished, well developed in no acute distress HEENT: Normal NECK: No JVD; No carotid bruits LYMPHATICS: No lymphadenopathy CARDIAC: RRR, no murmurs, rubs, gallops RESPIRATORY:  Clear to auscultation without rales, wheezing or rhonchi  ABDOMEN: Soft, non-tender, non-distended MUSCULOSKELETAL:  No edema; No deformity  SKIN: Warm and dry NEUROLOGIC:  Alert and oriented x 3 PSYCHIATRIC:  Normal affect   ASSESSMENT:    1. Atherosclerosis of native coronary artery of native heart without angina pectoris   2. Essential hypertension, benign   3. Obstructive sleep apnea   4. Pure hypercholesterolemia    PLAN:    In order of problems listed above:  1.  ASCAD - cath with nonobstructive CAD with 20% OM.  SHe denies any anginal symptoms.  She will continue on ASA 81mg  daily and statin.    2.  HTN - her BP is well controlled on exam today.  She will continue on Lisinopril 5mg  daily.    3.  OSA - the patient is tolerating PAP therapy well without any problems. The PAP download was reviewed today and showed an AHI of 5.1/hr on 12 cm H2O with 67% compliance in using more than 4  hours nightly.  The patient has been using and benefiting from PAP use and will continue to benefit from therapy. I encouraged her to be more compliant.  She would like to get a new machine.  Her last machine is 68 years old.  I will order her a ResMed CPAP at 12cm H2O with heated humidity and supplies.   4.  Hyperlipidemia with LDL goal < 70.  She will continue on atorvastatin 40mg  daily and Zetia 10mg  daily.  Her last LDL was above goal at 85 and her atorvastatin was increased to 40mg  daily. She has another  FLP and ALT due soon.    Medication Adjustments/Labs and Tests Ordered: Current medicines are reviewed at length with the patient today.  Concerns regarding medicines are outlined above.  No orders of the defined types were placed in this encounter.  No orders of the defined types were placed in this encounter.   Signed, Fransico Him, MD  09/14/2017 11:43 AM    [place]

## 2017-09-19 ENCOUNTER — Ambulatory Visit
Admission: RE | Admit: 2017-09-19 | Discharge: 2017-09-19 | Disposition: A | Discharge status: Home / Self Care | Payer: Medicare Other | Source: Ambulatory Visit | Attending: Family Medicine | Admitting: Family Medicine

## 2017-09-19 DIAGNOSIS — N63 Unspecified lump in unspecified breast: Secondary | ICD-10-CM

## 2017-09-21 ENCOUNTER — Ambulatory Visit: Payer: Medicare Other | Admitting: Podiatry

## 2017-09-21 ENCOUNTER — Encounter: Payer: Self-pay | Admitting: Podiatry

## 2017-09-21 DIAGNOSIS — R234 Changes in skin texture: Secondary | ICD-10-CM | POA: Diagnosis not present

## 2017-09-21 DIAGNOSIS — L608 Other nail disorders: Secondary | ICD-10-CM

## 2017-09-21 DIAGNOSIS — L600 Ingrowing nail: Secondary | ICD-10-CM | POA: Diagnosis not present

## 2017-09-21 MED ORDER — AMMONIUM LACTATE 12 % EX CREA: TOPICAL_CREAM | CUTANEOUS | 0 refills | Status: DC | PRN

## 2017-09-21 NOTE — Progress Notes (Signed)
This patient  Presents to the office stating that her toenails are growing up and in.  She says she started having problems when she had a pedicure months ago.  She presents to the office in January and was seen by Dr. March Rummage.  Dr. March Rummage debrided  the nails at that visit and told her she would improve.  She presents today saying her nails have grown into the skin on both big toes.  She says she was in [place] and had to live with pus in the right great toenail.  She presents to the office today for continued evaluation and treatment.   General Appearance  Alert, conversant and in no acute stress.  Vascular  Dorsalis pedis and posterior pulses are palpable  bilaterally.  Capillary return is within normal limits  bilaterally. Temperature is within normal limits  Bilaterally.  Neurologic  Senn-Weinstein monofilament wire test within normal limits  bilaterally. Muscle power within normal limits bilaterally.  Nails thick disfigured discolored nails with  pincer toenail deformity noted both hallux nails in the absence of infection.  Orthopedic  No limitations of motion of motion feet bilaterally.  No crepitus or effusions noted.  Functional hallux limitus noted.  No plantar fasciitis noted.  Skin  normotropic skin with no porokeratosis noted bilaterally.  No signs of infections or ulcers noted.  Nail dystrophy  Pincer nails hallux  B/L.  ROV  Debride hallux nails.  Examination of her nails were performed and conservative and surgical correction of her pincer toenails was discussed.  We chose to have her return to the office in 10 weeks for preventative foot care services.  We can then discuss surgical correction if needed.   Gardiner Barefoot DPM

## 2017-09-25 ENCOUNTER — Other Ambulatory Visit: Payer: Self-pay | Admitting: Cardiology

## 2017-09-30 ENCOUNTER — Other Ambulatory Visit: Payer: Self-pay | Admitting: Cardiology

## 2017-10-01 ENCOUNTER — Other Ambulatory Visit: Payer: Medicare Other | Admitting: *Deleted

## 2017-10-01 DIAGNOSIS — E785 Hyperlipidemia, unspecified: Secondary | ICD-10-CM

## 2017-10-02 LAB — LIPID PANEL
CHOL/HDL RATIO: 3.1 ratio (ref 0.0–4.4)
Cholesterol, Total: 131 mg/dL (ref 100–199)
HDL: 42 mg/dL (ref >39)
LDL Calculated: 61 mg/dL (ref 0–99)
Triglycerides: 139 mg/dL (ref 0–149)
VLDL CHOLESTEROL CAL: 28 mg/dL (ref 5–40)

## 2017-10-02 LAB — ALT: ALT: 15 IU/L (ref 0–32)

## 2017-10-11 ENCOUNTER — Telehealth: Payer: Self-pay | Admitting: Cardiology

## 2017-10-11 ENCOUNTER — Telehealth: Payer: Self-pay | Admitting: *Deleted

## 2017-10-11 DIAGNOSIS — G4733 Obstructive sleep apnea (adult) (pediatric): Secondary | ICD-10-CM

## 2017-10-11 NOTE — Telephone Encounter (Signed)
Per Dr. Radford Pax:   ResMed CPAP at 12cm H2O with heated humidity and supplies.  Ordered through [facility]

## 2017-10-11 NOTE — Telephone Encounter (Signed)
Per Dr. Radford Pax: ResMed CPAP at 12cm H2O with heated humidity and supplies. Ordered.

## 2017-10-11 NOTE — Telephone Encounter (Signed)
New Message   Pt calling to check on the new cpap machine that Dr.Turner was suppose to order. Please call

## 2017-10-15 ENCOUNTER — Other Ambulatory Visit: Payer: Self-pay | Admitting: *Deleted

## 2017-11-02 ENCOUNTER — Telehealth: Payer: Self-pay | Admitting: Cardiology

## 2017-11-02 DIAGNOSIS — G4733 Obstructive sleep apnea (adult) (pediatric): Secondary | ICD-10-CM

## 2017-11-02 NOTE — Telephone Encounter (Signed)
New Message   1) What problem are you experiencing? Patient states that the air pressure for her CPAP machine needs to be lowered.   2) Who is your medical equipment company? [employer]     Please route to the sleep study assistant.

## 2017-11-02 NOTE — Telephone Encounter (Signed)
Please get an 2 week CPAP autotitration fro 4 to 18cm H2O

## 2017-11-06 ENCOUNTER — Encounter: Payer: Self-pay | Admitting: *Deleted

## 2017-11-06 NOTE — Telephone Encounter (Addendum)
Patient has a 10 week follow up appointment scheduled for APRIL 29 at 2:40  2019. Patient understands she needs to keep this appointment for insurance compliance. Patient was grateful for the call and thanked me.

## 2017-11-06 NOTE — Addendum Note (Signed)
Addended by: Freada Bergeron on: 11/06/2017 12:32 PM   Modules accepted: Orders

## 2017-11-06 NOTE — Addendum Note (Signed)
Addended by: Freada Bergeron on: 11/06/2017 12:46 PM   Modules accepted: Orders

## 2017-11-06 NOTE — Telephone Encounter (Signed)
This encounter was created in error - please disregard.

## 2017-11-06 NOTE — Telephone Encounter (Signed)
Order sent to [facility] °

## 2017-11-07 ENCOUNTER — Other Ambulatory Visit: Payer: Self-pay | Admitting: Family Medicine

## 2017-11-07 DIAGNOSIS — R221 Localized swelling, mass and lump, neck: Secondary | ICD-10-CM

## 2017-11-12 ENCOUNTER — Ambulatory Visit
Admission: RE | Admit: 2017-11-12 | Discharge: 2017-11-12 | Disposition: A | Discharge status: Home / Self Care | Payer: Medicare Other | Source: Ambulatory Visit | Attending: Family Medicine | Admitting: Family Medicine

## 2017-11-12 DIAGNOSIS — R221 Localized swelling, mass and lump, neck: Secondary | ICD-10-CM

## 2017-11-14 ENCOUNTER — Encounter: Payer: Self-pay | Admitting: Cardiology

## 2017-11-15 ENCOUNTER — Telehealth: Payer: Self-pay | Admitting: Cardiology

## 2017-11-15 DIAGNOSIS — G4733 Obstructive sleep apnea (adult) (pediatric): Secondary | ICD-10-CM

## 2017-11-15 NOTE — Telephone Encounter (Signed)
1) What problem are you experiencing? CPAP machine pressure is up and patient has tried to change pressure but it keeps blowing the mask up. Patient has two machines a new machine and and older one of the same model and has some concerns about coverage   2) Who is your medical equipment company? [employer]   Please route to the sleep study assistant.

## 2017-11-15 NOTE — Telephone Encounter (Signed)
Printed compliance report and sent to medical records for Dr Radford Pax to read.

## 2017-11-16 NOTE — Telephone Encounter (Signed)
Patient is on a 2 week auto titration but has not used it for the last 2 nights. It is a full face mask that leaks at the bottom and if she tightens it any tighter it blows out the top. Per [facility] please send an order for pressure change for 4 to 16.

## 2017-11-16 NOTE — Telephone Encounter (Signed)
Change Auto CPAP to 4 to 16cm H2o

## 2017-11-16 NOTE — Telephone Encounter (Signed)
Order sent Change via community message to [facility]

## 2017-11-19 NOTE — Telephone Encounter (Signed)
Per my previous message patient does not have an auto-titrating unit. Therefore she will need a set pressure per [facility]. Please advise.

## 2017-11-19 NOTE — Addendum Note (Signed)
Addended by: Freada Bergeron on: 11/19/2017 03:54 PM   Modules accepted: Orders

## 2017-11-19 NOTE — Telephone Encounter (Signed)
Per [facility] patient does not have an auto unit. Patient needs a new prescription for a set pressure.please.

## 2017-11-19 NOTE — Telephone Encounter (Signed)
Please change auto CPAP to 4 to 16cm H2O.  Please talk with DME about trying a Fisher [employer]kel full face mask since her current mask is leaking

## 2017-11-19 NOTE — Telephone Encounter (Signed)
Please set CPAP at 12cm H2o and get a Fisher [employer] full face mask to see if it fits better - get a download in 2 weeks

## 2017-11-19 NOTE — Telephone Encounter (Signed)
Initially [facility] requested pressure change for range of 4 to 16. They then found out patient does not have an auto titrating machine and therefore they have requested advisement for a set pressure. If you need further information I can contact [place].

## 2017-11-19 NOTE — Telephone Encounter (Signed)
Your went me a note stating that DME wanted auto pressure changed to 4-16cm H2o - see your note below at bottom

## 2017-11-20 ENCOUNTER — Telehealth: Payer: Self-pay | Admitting: *Deleted

## 2017-11-20 NOTE — Telephone Encounter (Signed)
Spoke to patient who states she had not been contacted by [facility] of the new order to change her settings. Patient was informed her settings had been changed and that she should resume using her own CPAP after she has finished her 2 week auto titration on the loaner machine so that she does not get dinged by her insurance for non compliance. Patient states she was not aware that her settings had been changed and she will get back to using her machine.

## 2017-11-20 NOTE — Telephone Encounter (Signed)
Informed patient of compliance results and verbalized understanding was indicated. Patient is aware and agreeable to AHI being within range at 4.6. Patient is aware and agreeable to improving her compliance with machine usage. Patient is aware that Dr Radford Pax has made a change to her settings to help improve her compliance. Pt is aware and agreeable to treatment.

## 2017-11-20 NOTE — Telephone Encounter (Signed)
-----   Message from [name], MD sent at 11/17/2017  3:52 PM EDT ----- Good AHI on PAP but needs to improve compliance

## 2017-11-26 ENCOUNTER — Ambulatory Visit: Payer: Medicare Other | Admitting: Cardiology

## 2017-11-26 VITALS — BP 120/76 | HR 76 | Ht 66.0 in | Wt 207.0 lb

## 2017-11-26 DIAGNOSIS — I10 Essential (primary) hypertension: Secondary | ICD-10-CM | POA: Diagnosis not present

## 2017-11-26 DIAGNOSIS — E669 Obesity, unspecified: Secondary | ICD-10-CM | POA: Diagnosis not present

## 2017-11-26 DIAGNOSIS — G4733 Obstructive sleep apnea (adult) (pediatric): Secondary | ICD-10-CM | POA: Diagnosis not present

## 2017-11-26 NOTE — Telephone Encounter (Signed)
Order faxed to [facility] to [phone].

## 2017-11-26 NOTE — Progress Notes (Signed)
Cardiology Office Note:    Date:  11/26/2017   ID:  [name], DOB 24-May-1951, MRN [id number]  PCP:  Kathyrn Lass, MD  Cardiologist:  No primary care provider on file.    Referring MD: Kathyrn Lass, MD   Chief Complaint  Patient presents with  . Sleep Apnea  . Hypertension    History of Present Illness:    [name] is a 67 y.o. female with a hx of nonobstructive ASCAD with 20% OM, HTN, dyslipidemia, and OSA on CPAP.  At her last office visit she requested getting a new CPAP machine.  Her machine was greater than 24 years of age so we went ahead and ordered a new CPAP device and she is now back per insurance requirements to document that she is compliant.  Since she got her new CPAP device she has had a lot of problems.  She says that the pressure is so hard and so much pressure that it causes her mask to leak significantly.  She was not having problems in the past with her old mask and she tried using her old mask with device but still says that the pressure is too high.  In looking back at her pressure she has been on the past she is always been on 12 cm water pressure which is what she is on now but says that it seems like the pressure is much more.  She has been very noncompliant with the device because she says the mask is leaking so much she does not want to put up with it.  Past Medical History:  Diagnosis Date  . Arthritis   . Chronic fatigue    and weakness  . Colon polyps   . Coronary artery disease    nonobstructive with 20% OM by cath 2006  . Depression   . Diabetes mellitus without complication ([place])   . Diastolic dysfunction    w Elevated LVEDP  . DJD (degenerative joint disease)    C3/4 Dr Sherwood Gambler  . GERD (gastroesophageal reflux disease)   . H/O hiatal hernia   . High cholesterol   . Hypertension   . Hypoglycemia   . Iron deficiency anemia   . Kidney stones   . Multiple thyroid nodules   . Neurocardiogenic syncope   . Obesity   .  Prediabetes 2011 & 2012  . Right knee pain 12/2009   Dr Marlou Sa  . Sleep apnea    CPAP had Sleep Study ordered by Dr. Radford Pax  . Tick bite 11/2016  . Vasovagal syncope     Past Surgical History:  Procedure Laterality Date  . ACHILLES TENDON SURGERY Left 01/23/2013   Procedure: EXCISION PARTIAL BONE TALUS/CALCANEUS, REPAIR RUPTURE ACHILLES TENDON PRIMARY OPEN ;  Surgeon: Ninetta Lights, MD;  Location: [facility];  Service: Orthopedics;  Laterality: Left;  . ANTERIOR FUSION CERVICAL SPINE     x2-cervical  . BREAST CYST EXCISION Left   . CARDIAC CATHETERIZATION  ~ 2008   Nonobstructive ASCAD, 20% OM  . CARPAL TUNNEL RELEASE Left   . LITHOTRIPSY    . TONSILLECTOMY    . TOTAL KNEE ARTHROPLASTY Right 11/2010  . TOTAL KNEE ARTHROPLASTY  12/13/2011   Procedure: TOTAL KNEE ARTHROPLASTY;  Surgeon: Ninetta Lights, MD;  Location: [facility];  Service: Orthopedics;  Laterality: Left;  DR MURPHY WANTS 90 MINUTES FOR THIS CASE    Current Medications: Current Meds  Medication Sig  . ALPRAZolam (XANAX) 1 MG tablet Take  1 mg by mouth at bedtime.  Marland Kitchen ammonium lactate (LAC-HYDRIN) 12 % cream Apply topically as needed for dry skin.  Marland Kitchen aspirin 81 MG tablet Take 81 mg by mouth daily.  Marland Kitchen buPROPion (WELLBUTRIN XL) 150 MG 24 hr tablet TAKE 1 TAB EVERY OTHER MORNING FOR ABOUT 10 DAYS THEN INCREASE TO 1 TAB IN THE MORNIN WITH FOOD  . desvenlafaxine (PRISTIQ) 50 MG 24 hr tablet Take 50 mg by mouth daily.  Marland Kitchen esomeprazole (NEXIUM) 40 MG capsule Take 1 capsule (40 mg total) by mouth 2 (two) times daily before a meal.  . ezetimibe (ZETIA) 10 MG tablet TAKE 1 TABLET BY MOUTH EVERY DAY  . ibuprofen (ADVIL,MOTRIN) 200 MG tablet Take 400 mg by mouth as needed (pain).   Marland Kitchen lisinopril (PRINIVIL,ZESTRIL) 5 MG tablet TAKE 1 TABLET BY MOUTH EVERY DAY  . metFORMIN (GLUCOPHAGE) 500 MG tablet Take 500 mg by mouth every evening.   . Omega-3 Fatty Acids (FISH OIL) 1200 MG CAPS Take 3 capsules by mouth daily.  .  traZODone (DESYREL) 50 MG tablet Take 100 mg by mouth at bedtime.    Current Facility-Administered Medications for the 11/26/17 encounter (Office Visit) with [name], MD  Medication  . 0.9 %  sodium chloride infusion     Allergies:   Guaifenesin & derivatives; Morphine and related; Penicillins; Tramadol; Hydrocodone-acetaminophen; and Oxycodone hcl   Social History   Socioeconomic History  . Marital status: Single    Spouse name: Not on file  . Number of children: 0  . Years of education: Not on file  . Highest education level: Not on file  Occupational History  . Occupation: retired  Scientific laboratory technician  . Financial resource strain: Not on file  . Food insecurity:    Worry: Not on file    Inability: Not on file  . Transportation needs:    Medical: Not on file    Non-medical: Not on file  Tobacco Use  . Smoking status: Never Smoker  . Smokeless tobacco: Never Used  Substance and Sexual Activity  . Alcohol use: Yes    Alcohol/week: 3.6 oz    Types: 6 Glasses of wine per week    Comment: couple times per week.  . Drug use: No  . Sexual activity: Never  Lifestyle  . Physical activity:    Days per week: Not on file    Minutes per session: Not on file  . Stress: Not on file  Relationships  . Social connections:    Talks on phone: Not on file    Gets together: Not on file    Attends religious service: Not on file    Active member of club or organization: Not on file    Attends meetings of clubs or organizations: Not on file    Relationship status: Not on file  Other Topics Concern  . Not on file  Social History Narrative  . Not on file     Family History: The patient's family history includes Breast cancer in her sister; CAD in her father; CVA in her mother; Cancer in her paternal aunt; Cancer - Prostate in her father; Colon cancer in her other; Crohn's disease in her paternal aunt; Diverticulosis in her father, mother, and sister; Heart disease in her father and  paternal grandfather; Hypertension in her sister; Kidney disease in her mother.  ROS:   Please see the history of present illness.    ROS  All other systems reviewed and negative.   EKGs/Labs/Other  Studies Reviewed:    The following studies were reviewed today: PAP download  EKG:  EKG is not ordered today.   Recent Labs: 10/01/2017: ALT 15   Recent Lipid Panel    Component Value Date/Time   CHOL 131 10/01/2017 1337   TRIG 139 10/01/2017 1337   HDL 42 10/01/2017 1337   CHOLHDL 3.1 10/01/2017 1337   CHOLHDL 3.9 08/05/2015 1149   VLDL 34 (H) 08/05/2015 1149   LDLCALC 61 10/01/2017 1337   LDLDIRECT 93.3 09/17/2013 1307    Physical Exam:    VS:  BP 120/76   Pulse 76   Ht 5\' 6"  (1.676 m)   Wt 207 lb (93.9 kg)   BMI 33.41 kg/m     Wt Readings from Last 3 Encounters:  11/26/17 207 lb (93.9 kg)  09/14/17 222 lb (100.7 kg)  12/14/16 241 lb (109.3 kg)     GEN:  Well nourished, well developed in no acute distress HEENT: Normal NECK: No JVD; No carotid bruits LYMPHATICS: No lymphadenopathy CARDIAC: RRR, no murmurs, rubs, gallops RESPIRATORY:  Clear to auscultation without rales, wheezing or rhonchi  ABDOMEN: Soft, non-tender, non-distended MUSCULOSKELETAL:  No edema; No deformity  SKIN: Warm and dry NEUROLOGIC:  Alert and oriented x 3 PSYCHIATRIC:  Normal affect   ASSESSMENT:    1. Obstructive sleep apnea   2. Essential hypertension, benign   3. Obesity (BMI 30-39.9)    PLAN:    In order of problems listed above:  1.  OSA - the patient has not been tolerating the new CPAP device.  Despite the device being set on 12 cm water pressure which which is what she has been on for several years she says that the pressure seems like it is very very high and slowing the mask off her face.  She has a new mask but is also tried using her old mask which she never had problems with in the past and she says the pressure blows her mask off as well with the new device.  In looking  back at her records she has always been on 12 cm of water pressure and has never been on a higher or a lower setting.  The PAP download was reviewed today and showed an AHI of 0.7/hr on 12 cm H2O with 10% compliance in using more than 4 hours nightly.  At this point I am not sure of why she is having difficulty since she has tolerated her Pap very well in the past at the exact same pressure and her apneas are completely resolved on this setting.  The patient has been using and benefiting from PAP use and will continue to benefit from therapy.  I have encouraged her to try to be more compliant with her device.  I will decrease her CPAP to 11 cm of water pressure to see if this helps improve her mask leakage and I will get a download in 2 weeks.  2.  HTN - BP is controlled on exam today.  She will continue on lisinopril 5 mg daily.  3.  Obesity - I have encouraged her to get into a routine exercise program and cut back on carbs and portions.   Medication Adjustments/Labs and Tests Ordered: Current medicines are reviewed at length with the patient today.  Concerns regarding medicines are outlined above.  No orders of the defined types were placed in this encounter.  No orders of the defined types were placed in this encounter.   Signed, Tressia Miners  Radford Pax, MD  11/26/2017 3:09 PM    [place]

## 2017-11-26 NOTE — Telephone Encounter (Signed)
Decrease PAP to 11 cm water pressure and D/L in 2 weeks

## 2017-11-26 NOTE — Patient Instructions (Signed)
Medication Instructions:  Your physician recommends that you continue on your current medications as directed. Please refer to the Current Medication list given to you today.  If you need a refill on your cardiac medications, please contact your pharmacy first.  Labwork: None ordered   Testing/Procedures: None ordered   Follow-Up: Your physician wants you to follow-up in: 1 year with Dr. Turner. You will receive a reminder letter in the mail two months in advance. If you don't receive a letter, please call our office to schedule the follow-up appointment.  Any Other Special Instructions Will Be Listed Below (If Applicable).   Thank you for choosing [facility]    Rena Jaz Laningham, RN  [phone]  If you need a refill on your cardiac medications before your next appointment, please call your pharmacy.   

## 2017-11-26 NOTE — Telephone Encounter (Signed)
  Robertson, Donnisha, RN  Samina Weekes G, CMA        dme order placed   Thanks  Rena    

## 2017-12-03 ENCOUNTER — Other Ambulatory Visit: Payer: Self-pay | Admitting: Family Medicine

## 2017-12-03 DIAGNOSIS — R911 Solitary pulmonary nodule: Secondary | ICD-10-CM

## 2017-12-05 ENCOUNTER — Ambulatory Visit
Admission: RE | Admit: 2017-12-05 | Discharge: 2017-12-05 | Disposition: A | Discharge status: Home / Self Care | Payer: Medicare Other | Source: Ambulatory Visit | Attending: Family Medicine | Admitting: Family Medicine

## 2017-12-05 DIAGNOSIS — R911 Solitary pulmonary nodule: Secondary | ICD-10-CM

## 2017-12-06 NOTE — Telephone Encounter (Signed)
2 week auto sent to be scanned.

## 2017-12-14 NOTE — Telephone Encounter (Signed)
Patient is aware and agreeable to AHI being within range at 4.2. Patient is aware and agreeable to improving her compliance with machine usage Patient says she met with [facility] and got her mask adjustment and it is working better now.

## 2017-12-20 ENCOUNTER — Telehealth: Payer: Self-pay | Admitting: *Deleted

## 2017-12-20 NOTE — Telephone Encounter (Signed)
Informed patient of compliance results and verbalized understanding was indicated. Patient is aware and agreeable to AHI being within range at 4.4. Patient is aware and agreeable to improving her compliance with machine usage. Patient states she ran out of distilled water and she did not use her cpap for 2 days but is back on track now. Patient is aware and agreeable to no change in current pressures.

## 2017-12-20 NOTE — Telephone Encounter (Signed)
-----   Message from [name], MD sent at 12/19/2017  3:40 PM EDT ----- Good AHI on PAP but needs to improve compliance

## 2017-12-26 ENCOUNTER — Ambulatory Visit: Payer: Medicare Other | Admitting: Podiatry

## 2017-12-26 ENCOUNTER — Encounter: Payer: Self-pay | Admitting: Podiatry

## 2017-12-26 DIAGNOSIS — L608 Other nail disorders: Secondary | ICD-10-CM

## 2017-12-26 DIAGNOSIS — M79675 Pain in left toe(s): Secondary | ICD-10-CM

## 2017-12-26 DIAGNOSIS — L600 Ingrowing nail: Secondary | ICD-10-CM

## 2017-12-26 DIAGNOSIS — B351 Tinea unguium: Secondary | ICD-10-CM

## 2017-12-26 NOTE — Progress Notes (Addendum)
This patient  Presents to the office stating that her toenails are growing up and in.      She presents today saying her nails have grown into the skin on both big toes.  .  She presents to the office today for continued evaluation and treatment..  She wishes to discuss permanent nail correction. Patient is diabetic.   General Appearance  Alert, conversant and in no acute stress.  Vascular  Dorsalis pedis and posterior pulses are palpable  bilaterally.  Capillary return is within normal limits  bilaterally. Temperature is within normal limits  Bilaterally.  Neurologic  Senn-Weinstein monofilament wire test within normal limits  bilaterally. Muscle power within normal limits bilaterally.  Nails thick disfigured discolored nails with  pincer toenail deformity noted both hallux nails in the absence of infection.  Orthopedic  No limitations of motion of motion feet bilaterally.  No crepitus or effusions noted.  Functional hallux limitus noted.  No plantar fasciitis noted.  Skin  normotropic skin with no porokeratosis noted bilaterally.  No signs of infections or ulcers noted.  Nail dystrophy  Pincer nails hallux  B/L.  ROV  Debride hallux nails.  Examination of her nails were performed and conservative and surgical correction of her pincer toenails was discussed. Patient to make an appointment for nail surgery. Right hallux needs both corners corrected.  Left hallux needs total nail removal with cauterization.   Gardiner Barefoot DPM

## 2017-12-28 ENCOUNTER — Telehealth: Payer: Self-pay | Admitting: *Deleted

## 2017-12-28 ENCOUNTER — Ambulatory Visit: Payer: Medicare Other | Admitting: Podiatry

## 2017-12-28 NOTE — Telephone Encounter (Signed)
Pt asked what type of shoe to wear, and could she exercise. I told pt to wear an open-toed shoe, one without a thong between the toe with a strap that opened at the base of the toe worked better. Pt asked if she could exercise, I told her she would be able to do her regular duties but may want to hold off on exercise. Pt asked if she would get pain medication. I told her it was a flammatory process while healing, so an antiinflammatory medication like ibuprofen or aleve. Pt states understanding.

## 2017-12-28 NOTE — Telephone Encounter (Signed)
-----   Message from [name], MD sent at 12/27/2017 11:47 PM EDT ----- Good AHI on PAP but needs to improve compliance

## 2017-12-28 NOTE — Telephone Encounter (Signed)
Pt states she has questions about toenail removal procedure.

## 2017-12-28 NOTE — Telephone Encounter (Signed)
Informed patient of compliance results and verbalized understanding was indicated. Patient is aware and agreeable to AHI being within range at 6.6. Patient is aware and agreeable to improving compliance with machine usage.

## 2018-01-14 ENCOUNTER — Encounter: Payer: Self-pay | Admitting: Cardiology

## 2018-01-14 ENCOUNTER — Ambulatory Visit: Payer: Medicare Other | Admitting: Cardiology

## 2018-01-14 VITALS — BP 136/60 | HR 77 | Ht 66.0 in | Wt 207.2 lb

## 2018-01-14 DIAGNOSIS — G4733 Obstructive sleep apnea (adult) (pediatric): Secondary | ICD-10-CM | POA: Diagnosis not present

## 2018-01-14 DIAGNOSIS — I10 Essential (primary) hypertension: Secondary | ICD-10-CM

## 2018-01-14 DIAGNOSIS — E669 Obesity, unspecified: Secondary | ICD-10-CM | POA: Diagnosis not present

## 2018-01-14 NOTE — Patient Instructions (Signed)
Medication Instructions:  Your physician recommends that you continue on your current medications as directed. Please refer to the Current Medication list given to you today.  If you need a refill on your cardiac medications, please contact your pharmacy first.  Labwork: None ordered   Testing/Procedures: None ordered   Follow-Up: Your physician wants you to follow-up in: 1 year with Dr. Turner. You will receive a reminder letter in the mail two months in advance. If you don't receive a letter, please call our office to schedule the follow-up appointment.  Any Other Special Instructions Will Be Listed Below (If Applicable).   Thank you for choosing [facility]    Rena Renley Banwart, RN  [phone]  If you need a refill on your cardiac medications before your next appointment, please call your pharmacy.   

## 2018-01-14 NOTE — Progress Notes (Signed)
Cardiology Office Note:    Date:  01/14/2018   ID:  [name], DOB 08-Apr-1951, MRN [id number]  PCP:  Carol Lass, MD  Cardiologist:  No primary care provider on file.    Referring MD: Carol Lass, MD   Chief Complaint  Patient presents with  . Sleep Apnea  . Hypertension    History of Present Illness:    [name] is a 67 y.o. female with a hx of nonobstructive ASCAD with 20% OM, HTN, dyslipidemia, and OSA on CPAP.    She is here today for insurance reasons because she just got a new mask.  She was having problems with her other mask when she got her new CPAP device.  It was a nasal mask.  She is now switched to a medium size full facemask which she is tolerating much better.  She says the only time she has a problem is when her mouth drops open and then the mask will start to leak.  She says she would like to try a chinstrap to see if that would help.  Otherwise she is tolerating her device and feels the pressure is adequate.  She feels rested in the morning and has no significant daytime sleepiness and does not have to take a daytime nap.  She does not have any real significant mouth dryness.   Past Medical History:  Diagnosis Date  . Arthritis   . Chronic fatigue    and weakness  . Colon polyps   . Coronary artery disease    nonobstructive with 20% OM by cath 2006  . Depression   . Diabetes mellitus without complication ([place])   . Diastolic dysfunction    w Elevated LVEDP  . DJD (degenerative joint disease)    C3/4 Dr Sherwood Gambler  . GERD (gastroesophageal reflux disease)   . H/O hiatal hernia   . High cholesterol   . Hypertension   . Hypoglycemia   . Iron deficiency anemia   . Kidney stones   . Multiple thyroid nodules   . Neurocardiogenic syncope   . Obesity   . Prediabetes 2011 & 2012  . Right knee pain 12/2009   Dr Marlou Sa  . Sleep apnea    CPAP had Sleep Study ordered by Dr. Radford Pax  . Tick bite 11/2016  . Vasovagal syncope     Past Surgical  History:  Procedure Laterality Date  . ACHILLES TENDON SURGERY Left 01/23/2013   Procedure: EXCISION PARTIAL BONE TALUS/CALCANEUS, REPAIR RUPTURE ACHILLES TENDON PRIMARY OPEN ;  Surgeon: Ninetta Lights, MD;  Location: [facility];  Service: Orthopedics;  Laterality: Left;  . ANTERIOR FUSION CERVICAL SPINE     x2-cervical  . BREAST CYST EXCISION Left   . CARDIAC CATHETERIZATION  ~ 2008   Nonobstructive ASCAD, 20% OM  . CARPAL TUNNEL RELEASE Left   . LITHOTRIPSY    . TONSILLECTOMY    . TOTAL KNEE ARTHROPLASTY Right 11/2010  . TOTAL KNEE ARTHROPLASTY  12/13/2011   Procedure: TOTAL KNEE ARTHROPLASTY;  Surgeon: Ninetta Lights, MD;  Location: [facility];  Service: Orthopedics;  Laterality: Left;  DR MURPHY WANTS 90 MINUTES FOR THIS CASE    Current Medications: Current Meds  Medication Sig  . ALPRAZolam (XANAX) 1 MG tablet Take 1 mg by mouth at bedtime.  Marland Kitchen ammonium lactate (LAC-HYDRIN) 12 % cream Apply topically as needed for dry skin.  Marland Kitchen aspirin 81 MG tablet Take 81 mg by mouth daily.  Marland Kitchen desvenlafaxine (PRISTIQ) 50 MG  24 hr tablet Take 50 mg by mouth daily.  Marland Kitchen esomeprazole (NEXIUM) 40 MG capsule Take 1 capsule (40 mg total) by mouth 2 (two) times daily before a meal.  . ezetimibe (ZETIA) 10 MG tablet TAKE 1 TABLET BY MOUTH EVERY DAY  . ibuprofen (ADVIL,MOTRIN) 200 MG tablet Take 400 mg by mouth as needed (pain).   Marland Kitchen lisinopril (PRINIVIL,ZESTRIL) 5 MG tablet TAKE 1 TABLET BY MOUTH EVERY DAY  . metFORMIN (GLUCOPHAGE) 500 MG tablet Take 500 mg by mouth every evening.   . Omega-3 Fatty Acids (FISH OIL) 1200 MG CAPS Take 3 capsules by mouth daily.  . traZODone (DESYREL) 50 MG tablet Take 100 mg by mouth at bedtime.    Current Facility-Administered Medications for the 01/14/18 encounter (Office Visit) with Carol Margarita, MD  Medication  . 0.9 %  sodium chloride infusion     Allergies:   Guaifenesin & derivatives; Morphine and related; Penicillins; Tramadol;  Hydrocodone-acetaminophen; and Oxycodone hcl   Social History   Socioeconomic History  . Marital status: Single    Spouse name: Not on file  . Number of children: 0  . Years of education: Not on file  . Highest education level: Not on file  Occupational History  . Occupation: retired  Scientific laboratory technician  . Financial resource strain: Not on file  . Food insecurity:    Worry: Not on file    Inability: Not on file  . Transportation needs:    Medical: Not on file    Non-medical: Not on file  Tobacco Use  . Smoking status: Never Smoker  . Smokeless tobacco: Never Used  Substance and Sexual Activity  . Alcohol use: Yes    Alcohol/week: 3.6 oz    Types: 6 Glasses of wine per week    Comment: couple times per week.  . Drug use: No  . Sexual activity: Never  Lifestyle  . Physical activity:    Days per week: Not on file    Minutes per session: Not on file  . Stress: Not on file  Relationships  . Social connections:    Talks on phone: Not on file    Gets together: Not on file    Attends religious service: Not on file    Active member of club or organization: Not on file    Attends meetings of clubs or organizations: Not on file    Relationship status: Not on file  Other Topics Concern  . Not on file  Social History Narrative  . Not on file     Family History: The patient's family history includes Breast cancer in her sister; CAD in her father; CVA in her mother; Cancer in her paternal aunt; Cancer - Prostate in her father; Colon cancer in her other; Crohn's disease in her paternal aunt; Diverticulosis in her father, mother, and sister; Heart disease in her father and paternal grandfather; Hypertension in her sister; Kidney disease in her mother.  ROS:   Please see the history of present illness.    ROS  All other systems reviewed and negative.   EKGs/Labs/Other Studies Reviewed:    The following studies were reviewed today: PAP download  EKG:  EKG is not ordered today.      Recent Labs: 10/01/2017: ALT 15   Recent Lipid Panel    Component Value Date/Time   CHOL 131 10/01/2017 1337   TRIG 139 10/01/2017 1337   HDL 42 10/01/2017 1337   CHOLHDL 3.1 10/01/2017 1337   CHOLHDL 3.9  08/05/2015 1149   VLDL 34 (H) 08/05/2015 1149   LDLCALC 61 10/01/2017 1337   LDLDIRECT 93.3 09/17/2013 1307    Physical Exam:    VS:  BP 136/60   Pulse 77   Ht 5\' 6"  (1.676 m)   Wt 207 lb 3.2 oz (94 kg)   SpO2 95%   BMI 33.44 kg/m     Wt Readings from Last 3 Encounters:  01/14/18 207 lb 3.2 oz (94 kg)  11/26/17 207 lb (93.9 kg)  09/14/17 222 lb (100.7 kg)     GEN:  Well nourished, well developed in no acute distress HEENT: Normal NECK: No JVD; No carotid bruits LYMPHATICS: No lymphadenopathy CARDIAC: RRR, no murmurs, rubs, gallops RESPIRATORY:  Clear to auscultation without rales, wheezing or rhonchi  ABDOMEN: Soft, non-tender, non-distended MUSCULOSKELETAL:  No edema; No deformity  SKIN: Warm and dry NEUROLOGIC:  Alert and oriented x 3 PSYCHIATRIC:  Normal affect   ASSESSMENT:    1. OSA (obstructive sleep apnea)   2. Essential hypertension, benign   3. Obesity (BMI 30-39.9)    PLAN:    In order of problems listed above:  1.  OSA - the patient is tolerating PAP therapy well without any problems.  She is now on a medium size full facemask which she is tolerating the best out of 8 and the mass she is tried.  It still occasionally leaks when her mouth drops open.  The PAP download was reviewed today and showed an AHI of 6.6/hr on 11 cm H2O with 87% compliance in using more than 4 hours nightly.  The patient has been using and benefiting from PAP use and will continue to benefit from therapy.  I am going to order her a chin strap to wear with her full facemask in hopes that that will reduce the amount of leakage of the mask.  2.  HTN -BP is well controlled on exam today.  She will continue on lisinopril 5 mg daily.  3.  Obesity - I have encouraged her to get  into a routine exercise program and cut back on carbs and portions.   3.  Obesity -    Medication Adjustments/Labs and Tests Ordered: Current medicines are reviewed at length with the patient today.  Concerns regarding medicines are outlined above.  Orders Placed This Encounter  Procedures  . DME Other see comment   No orders of the defined types were placed in this encounter.   Signed, Fransico Him, MD  01/14/2018 12:07 PM    [place]

## 2018-01-18 ENCOUNTER — Ambulatory Visit: Payer: Medicare Other | Admitting: Podiatry

## 2018-01-21 ENCOUNTER — Telehealth: Payer: Self-pay | Admitting: *Deleted

## 2018-01-21 NOTE — Telephone Encounter (Signed)
Chin strap sent to [facility] via community message.

## 2018-03-12 ENCOUNTER — Other Ambulatory Visit: Payer: Self-pay

## 2018-03-12 ENCOUNTER — Telehealth: Payer: Self-pay | Admitting: Gastroenterology

## 2018-03-12 DIAGNOSIS — R7989 Other specified abnormal findings of blood chemistry: Secondary | ICD-10-CM

## 2018-03-12 DIAGNOSIS — R945 Abnormal results of liver function studies: Principal | ICD-10-CM

## 2018-03-12 NOTE — Telephone Encounter (Signed)
Pt just made appt for f/u liver on 05/09/18 w. Dr. Havery Moros, she is requesting to have labs before appt. Pls call pt to let know when she can go to get them done.

## 2018-03-12 NOTE — Telephone Encounter (Signed)
Patient due for repeat LFT's, orders placed and patient aware to come in closer to the time for her appointment.

## 2018-05-07 ENCOUNTER — Other Ambulatory Visit (INDEPENDENT_AMBULATORY_CARE_PROVIDER_SITE_OTHER): Payer: Medicare Other

## 2018-05-07 DIAGNOSIS — R945 Abnormal results of liver function studies: Secondary | ICD-10-CM

## 2018-05-07 DIAGNOSIS — R7989 Other specified abnormal findings of blood chemistry: Secondary | ICD-10-CM

## 2018-05-07 LAB — HEPATIC FUNCTION PANEL
ALT: 18 U/L (ref 0–35)
AST: 14 U/L (ref 0–37)
Albumin: 4.3 g/dL (ref 3.5–5.2)
Alkaline Phosphatase: 85 U/L (ref 39–117)
BILIRUBIN TOTAL: 0.3 mg/dL (ref 0.2–1.2)
Bilirubin, Direct: 0.1 mg/dL (ref 0.0–0.3)
Total Protein: 7.2 g/dL (ref 6.0–8.3)

## 2018-05-09 ENCOUNTER — Ambulatory Visit: Payer: Medicare Other | Admitting: Gastroenterology

## 2018-06-12 ENCOUNTER — Encounter: Payer: Self-pay | Admitting: Podiatry

## 2018-06-12 ENCOUNTER — Ambulatory Visit: Payer: Medicare Other | Admitting: Podiatry

## 2018-06-12 ENCOUNTER — Encounter

## 2018-06-12 DIAGNOSIS — L608 Other nail disorders: Secondary | ICD-10-CM | POA: Diagnosis not present

## 2018-06-12 DIAGNOSIS — L600 Ingrowing nail: Secondary | ICD-10-CM

## 2018-06-12 HISTORY — PX: OTHER SURGICAL HISTORY: SHX169

## 2018-06-12 MED ORDER — DOXYCYCLINE HYCLATE 100 MG PO TABS: 100.0000 mg | ORAL_TABLET | Freq: Two times a day (BID) | ORAL | 0 refills | Status: DC

## 2018-06-12 NOTE — Progress Notes (Signed)
This patient  Presents to the office stating that her toenails are growing up and in.      She presents today saying her nails have grown into the skin on both big toes.  .  She presents to the office today for continued evaluation and treatment..  She requests  permanent nail correction. Patient is diabetic.   General Appearance  Alert, conversant and in no acute stress.  Vascular  Dorsalis pedis and posterior pulses are palpable  bilaterally.  Capillary return is within normal limits  bilaterally. Temperature is within normal limits  Bilaterally.  Neurologic  Senn-Weinstein monofilament wire test within normal limits  bilaterally. Muscle power within normal limits bilaterally.  Nails thick disfigured discolored nails with  pincer toenail deformity noted both hallux nails in the absence of infection. Left hallux is pincer nail with no infection.  Orthopedic  No limitations of motion of motion feet bilaterally.  No crepitus or effusions noted.  Functional hallux limitus noted.  No plantar fasciitis noted.  Skin  normotropic skin with no porokeratosis noted bilaterally.  No signs of infections or ulcers noted.  Nail dystrophy  Pincer nails hallux  B/L.  ROV  Nail surgery.  Treatment options and alternatives discussed.  Recommended permanent phenol matrixectomy and patient agreed.  Left hallux was prepped with alcohol and a toe block of 3cc of 2% lidocaine plain was administered in a digital toe block. .  The toe was then prepped with betadine solution . A tourniquet was applied to toe. The offending nail  was then excised and matrix tissue exposed.  Phenol was then applied to the matrix tissue followed by an alcohol wash.  Antibiotic ointment and a dry sterile dressing was applied.  The patient was dispensed instructions for aftercare. Prescribe doxycycline.  RTC 10 days.     Gardiner Barefoot DPM

## 2018-06-12 NOTE — Patient Instructions (Signed)

## 2018-06-14 ENCOUNTER — Encounter: Payer: Self-pay | Admitting: Gastroenterology

## 2018-06-14 ENCOUNTER — Ambulatory Visit: Payer: Medicare Other | Admitting: Gastroenterology

## 2018-06-14 ENCOUNTER — Ambulatory Visit: Payer: Medicare Other | Admitting: Physical Therapy

## 2018-06-14 VITALS — BP 126/76 | HR 76 | Ht 66.5 in | Wt 226.2 lb

## 2018-06-14 DIAGNOSIS — R151 Fecal smearing: Secondary | ICD-10-CM | POA: Diagnosis not present

## 2018-06-14 DIAGNOSIS — K760 Fatty (change of) liver, not elsewhere classified: Secondary | ICD-10-CM | POA: Diagnosis not present

## 2018-06-14 DIAGNOSIS — K219 Gastro-esophageal reflux disease without esophagitis: Secondary | ICD-10-CM | POA: Diagnosis not present

## 2018-06-14 NOTE — Patient Instructions (Signed)
If you are age 68 or older, your body mass index should be between 23-30. Your Body mass index is 35.97 kg/m. If this is out of the aforementioned range listed, please consider follow up with your Primary Care Provider.  If you are age 47 or younger, your body mass index should be between 19-25. Your Body mass index is 35.97 kg/m. If this is out of the aformentioned range listed, please consider follow up with your Primary Care Provider.     Thank you for entrusting me with your care and for choosing [facility], Dr. Converse Cellar

## 2018-06-14 NOTE — Progress Notes (Signed)
HPI :  67 y/o female here for a follow up visit. She's previously been seen here for evaluation of GERD, dysphagia, elevated liver enzymes.  At her last visit over a year ago she was noted to have a mild ALT elevation. She underwent a serologic workup which was unremarkable under then a positive ANA. She had an ultrasound showing fatty liver which was thought to be a likely cause. She was not immune to hepatitis A being completed the vaccination series. She reports drinking 1 cup of coffee a day for this issue. She had an lost some weight down to around 200 pounds, unfortunately she reports her weight is back up and the 220s. She has been trying to manage this with diet. She had LFTs repeated in October and they were completely normal.   She had an upper endoscopy in May of last year for reflux and dysphagia. Her endoscopy was normal, no evidence of Barrett's esophagus, she had some benign gastric polyps. Her esophagus looked normal but empiric dilation was performed to 18 mm. She reports this result her dysphagia and she is not had recurrence of it since. She is taking Nexium every day and if she takes this on a daily basis her symptoms are controlled. She is on poorly on other regimens in the past.  She otherwise reports she has some looser stools on metformin. She does have some fecal leakage in difficult cleaning herself at times, asking about what she can do to help with this.   [name] 11/23/16 - fatty liver  Colonoscopy 06/06/2013 - diverticulosis, otherwise normal EGD 12/14/2016 - normal esophagus, empiric dilation performed to 53mm, benign gastric polyps - biopsies unremarkable  Past Medical History:  Diagnosis Date  . Arthritis   . Chronic fatigue    and weakness  . Colon polyps   . Coronary artery disease    nonobstructive with 20% OM by cath 2006  . Depression   . Diabetes mellitus without complication ([place])   . Diastolic dysfunction    w Elevated LVEDP  . DJD (degenerative joint  disease)    C3/4 Dr Sherwood Gambler  . GERD (gastroesophageal reflux disease)   . H/O hiatal hernia   . High cholesterol   . Hypertension   . Hypoglycemia   . Iron deficiency anemia   . Kidney stones   . Multiple thyroid nodules   . Neurocardiogenic syncope   . Obesity   . Prediabetes 2011 & 2012  . Right knee pain 12/2009   Dr Marlou Sa  . Sleep apnea    CPAP had Sleep Study ordered by Dr. Radford Pax  . Tick bite 11/2016  . Vasovagal syncope      Past Surgical History:  Procedure Laterality Date  . ACHILLES TENDON SURGERY Left 01/23/2013   Procedure: EXCISION PARTIAL BONE TALUS/CALCANEUS, REPAIR RUPTURE ACHILLES TENDON PRIMARY OPEN ;  Surgeon: Ninetta Lights, MD;  Location: [place];  Service: Orthopedics;  Laterality: Left;  . ANTERIOR FUSION CERVICAL SPINE     x2-cervical  . BREAST CYST EXCISION Left   . CARDIAC CATHETERIZATION  ~ 2008   Nonobstructive ASCAD, 20% OM  . CARPAL TUNNEL RELEASE Left   . LITHOTRIPSY    . OTHER SURGICAL HISTORY  06/12/2018   Left big toe nail surgery. [facility]   . TONSILLECTOMY    . TOTAL KNEE ARTHROPLASTY Right 11/2010  . TOTAL KNEE ARTHROPLASTY  12/13/2011   Procedure: TOTAL KNEE ARTHROPLASTY;  Surgeon: Ninetta Lights, MD;  Location: [place];  Service: Orthopedics;  Laterality: Left;  DR MURPHY WANTS 32 MINUTES FOR THIS CASE   Family History  Problem Relation Age of Onset  . CAD Father   . Cancer - Prostate Father   . Heart disease Father   . Diverticulosis Father   . Hypertension Sister   . Diverticulosis Sister   . Breast cancer Sister   . CVA Mother   . Diverticulosis Mother   . Kidney disease Mother   . Heart disease Paternal Grandfather   . Crohn's disease Paternal Aunt   . Cancer Paternal Aunt        x 2, type unknown  . Colon cancer Other        maternal great Aunt   Social History   Tobacco Use  . Smoking status: Never Smoker  . Smokeless tobacco: Never Used  Substance Use Topics  . Alcohol use: Yes     Alcohol/week: 6.0 standard drinks    Types: 6 Glasses of wine per week    Comment: couple times per week.  . Drug use: No   Current Outpatient Medications  Medication Sig Dispense Refill  . ALPRAZolam (XANAX) 1 MG tablet Take 1 mg by mouth at bedtime.  5  . ammonium lactate (LAC-HYDRIN) 12 % cream Apply topically as needed for dry skin. 385 g 0  . aspirin 81 MG tablet Take 81 mg by mouth daily.    Marland Kitchen atorvastatin (LIPITOR) 40 MG tablet Take 40 mg by mouth daily.  3  . clonazePAM (KLONOPIN) 0.5 MG tablet TAKE 1 TABLET BY MOUTH AT BEDTIME EVERY DAY  2  . desvenlafaxine (PRISTIQ) 50 MG 24 hr tablet Take 50 mg by mouth daily.    Marland Kitchen doxycycline (VIBRA-TABS) 100 MG tablet Take 1 tablet (100 mg total) by mouth 2 (two) times daily. 20 tablet 0  . esomeprazole (NEXIUM) 40 MG capsule Take 1 capsule (40 mg total) by mouth 2 (two) times daily before a meal. 90 capsule 3  . ezetimibe (ZETIA) 10 MG tablet TAKE 1 TABLET BY MOUTH EVERY DAY 90 tablet 3  . ibuprofen (ADVIL,MOTRIN) 200 MG tablet Take 400 mg by mouth as needed (pain).     Marland Kitchen lisinopril (PRINIVIL,ZESTRIL) 5 MG tablet TAKE 1 TABLET BY MOUTH EVERY DAY 90 tablet 3  . meloxicam (MOBIC) 15 MG tablet Take 15 mg by mouth daily.  0  . metFORMIN (GLUCOPHAGE) 500 MG tablet Take 500 mg by mouth every evening.     . Omega-3 Fatty Acids (FISH OIL) 1200 MG CAPS Take 3 capsules by mouth daily.    . traZODone (DESYREL) 50 MG tablet Take 100 mg by mouth at bedtime.      Current Facility-Administered Medications  Medication Dose Route Frequency Provider Last Rate Last Dose  . 0.9 %  sodium chloride infusion  500 mL Intravenous Continuous Tarri Guilfoil, Carlota Raspberry, MD       Allergies  Allergen Reactions  . Guaifenesin & Derivatives Itching  . Morphine And Related Itching    Hydrocodone and oxycodone  . Penicillins Itching  . Tramadol Itching  . Hydrocodone-Acetaminophen Itching  . Oxycodone Hcl Itching     Review of Systems: All systems reviewed and  negative except where noted in HPI.   Lab Results  Component Value Date   WBC 6.7 11/14/2016   HGB 12.5 11/14/2016   HCT 38.1 11/14/2016   MCV 77.6 (L) 11/14/2016   PLT 259.0 11/14/2016    Lab Results  Component Value Date  CREATININE 0.75 08/22/2016   BUN 14 08/22/2016   NA 141 08/22/2016   K 4.3 08/22/2016   CL 102 08/22/2016   CO2 25 08/22/2016    Lab Results  Component Value Date   ALT 18 05/07/2018   AST 14 05/07/2018   ALKPHOS 85 05/07/2018   BILITOT 0.3 05/07/2018      Physical Exam: BP 126/76   Pulse 76   Ht 5' 6.5" (1.689 m)   Wt 226 lb 4 oz (102.6 kg)   BMI 35.97 kg/m  Constitutional: Pleasant,well-developed, female in no acute distress. HEENT: Normocephalic and atraumatic. Conjunctivae are normal. No scleral icterus. Neck supple.  Cardiovascular: Normal rate, regular rhythm.  Pulmonary/chest: Effort normal and breath sounds normal. No wheezing, rales or rhonchi. Abdominal: Soft, nondistended, nontender.  There are no masses palpable. No hepatomegaly. Extremities: no edema Lymphadenopathy: No cervical adenopathy noted. Neurological: Alert and oriented to person place and time. Skin: Skin is warm and dry. No rashes noted. Psychiatric: Normal mood and affect. Behavior is normal.   ASSESSMENT AND PLAN: 67 year old female here for reassessment following issues:  Fatty liver - she has gained weight recently although surprisingly her LFTs are now normal. Discussed fatty liver in general. Recommend she abstain from alcohol, and work on weight loss via dieting. She will continue work on weight loss and seems motivated to do it. No evidence of cirrhosis thus far. She is vaccinated hepatitis A and B. She will continue routine coffee consumption. We'll plan on checking her LFTs yearly and follow-up yearly for this issue. She agreed.  GERD / Dysphagia - dysphagia is resolved post-dilation. No evidence of Barrett's on EGD. Recommend lowest-dose of PPI needed to  control symptoms or use of an alternative. I discussed risks and benefits of long-term PPI use with the patient, she reports she does not do well on alternative regimen, and wishes to continue PPI present dose for now.  Fecal leakage - discussed options, recommend a daily fiber supplement initially to see if that helps. If no improvement she should contact me for reassessment.  [place] Cellar, MD [facility]

## 2018-06-19 ENCOUNTER — Ambulatory Visit: Payer: Medicare Other | Admitting: Podiatry

## 2018-06-19 ENCOUNTER — Ambulatory Visit (INDEPENDENT_AMBULATORY_CARE_PROVIDER_SITE_OTHER): Payer: Medicare Other

## 2018-06-19 DIAGNOSIS — L608 Other nail disorders: Secondary | ICD-10-CM

## 2018-06-19 NOTE — Patient Instructions (Signed)

## 2018-06-20 NOTE — Progress Notes (Signed)
Patient is here today for follow-up appointment, recent procedure performed 06/12/2018, removal of ingrown toenail left hallux.  She states that overall the area is healing well and she is not having pain or complications at this time.  No redness, no erythema, no swelling, no drainage, no other signs and symptoms of infection.  Area is healing well and appears to be scabbing over at this time.  Discussed signs symptoms of infection.  Verbal written instructions were given to the patient.  She is to follow-up as needed with any acute symptom changes.

## 2018-07-03 ENCOUNTER — Telehealth: Payer: Self-pay | Admitting: Podiatry

## 2018-07-03 NOTE — Telephone Encounter (Signed)
Pt had toenail removed on 11/13 and is still getting a little blood on Band-Aid. Pt would like to also know when she will be able to walk again for exercise. Please give pt a call

## 2018-07-04 NOTE — Telephone Encounter (Addendum)
I called pt, informed that she may have oozing and weeping up to the end of the 3rd or 4th week, and should decrease the further she got from the procedure date. I told pt to continue the soaks twice a day with the epsom salt and warm water, cover with the topical antibiotic ordered like neosporin. Pt states she was told to allow to air dry at night, to do the soaks and antibiotic ointment once daily. I asked pt is the ointment was possibly reconstituting the scab and pt states it could be possible. Pt asked if she could exercise walk. I told pt if it was doctor ordered for therapy to continue to but cut back on the strenuousness, and time, but if problems stop. I told pt she should continue the daily soaks until there was a dry hard scab without redness, swelling or drainage.

## 2018-07-08 ENCOUNTER — Ambulatory Visit: Payer: Medicare Other | Attending: Family Medicine | Admitting: Physical Therapy

## 2018-07-08 ENCOUNTER — Encounter: Payer: Self-pay | Admitting: Physical Therapy

## 2018-07-08 ENCOUNTER — Other Ambulatory Visit: Payer: Self-pay

## 2018-07-08 DIAGNOSIS — R2689 Other abnormalities of gait and mobility: Secondary | ICD-10-CM | POA: Diagnosis present

## 2018-07-08 DIAGNOSIS — G8929 Other chronic pain: Secondary | ICD-10-CM | POA: Diagnosis present

## 2018-07-08 DIAGNOSIS — M545 Low back pain, unspecified: Secondary | ICD-10-CM

## 2018-07-08 DIAGNOSIS — M25552 Pain in left hip: Secondary | ICD-10-CM | POA: Diagnosis present

## 2018-07-08 DIAGNOSIS — M6281 Muscle weakness (generalized): Secondary | ICD-10-CM | POA: Insufficient documentation

## 2018-07-08 NOTE — Therapy (Signed)
[facility] [address] [place], [place], [address] Phone: [phone]   Fax:  [phone]  Physical Therapy Evaluation  Patient Details  Name: [name] MRN: [id number] Date of Birth: 28-Jun-1951 Referring Provider (PT): Dr. Kathyrn Lass   Encounter Date: 07/08/2018  PT End of Session - 07/08/18 1613    Visit Number  1    Date for PT Re-Evaluation  09/02/18    Authorization Type  [employer] medicare    PT Start Time  9030    PT Stop Time  1605    PT Time Calculation (min)  35 min    Activity Tolerance  Patient tolerated treatment well    Behavior During Therapy  [facility] for tasks assessed/performed       Past Medical History:  Diagnosis Date  . Arthritis   . Chronic fatigue    and weakness  . Colon polyps   . Coronary artery disease    nonobstructive with 20% OM by cath 2006  . Depression   . Diabetes mellitus without complication ([place])   . Diastolic dysfunction    w Elevated LVEDP  . DJD (degenerative joint disease)    C3/4 Dr Sherwood Gambler  . GERD (gastroesophageal reflux disease)   . H/O hiatal hernia   . High cholesterol   . Hypertension   . Hypoglycemia   . Iron deficiency anemia   . Kidney stones   . Multiple thyroid nodules   . Neurocardiogenic syncope   . Obesity   . Prediabetes 2011 & 2012  . Right knee pain 12/2009   Dr Marlou Sa  . Sleep apnea    CPAP had Sleep Study ordered by Dr. Radford Pax  . Tick bite 11/2016  . Vasovagal syncope     Past Surgical History:  Procedure Laterality Date  . ACHILLES TENDON SURGERY Left 01/23/2013   Procedure: EXCISION PARTIAL BONE TALUS/CALCANEUS, REPAIR RUPTURE ACHILLES TENDON PRIMARY OPEN ;  Surgeon: Ninetta Lights, MD;  Location: [place];  Service: Orthopedics;  Laterality: Left;  . ANTERIOR FUSION CERVICAL SPINE     x2-cervical  . BREAST CYST EXCISION Left   . CARDIAC CATHETERIZATION  ~ 2008   Nonobstructive ASCAD, 20% OM  . CARPAL TUNNEL  RELEASE Left   . LITHOTRIPSY    . OTHER SURGICAL HISTORY  06/12/2018   Left big toe nail surgery. [facility]   . TONSILLECTOMY    . TOTAL KNEE ARTHROPLASTY Right 11/2010  . TOTAL KNEE ARTHROPLASTY  12/13/2011   Procedure: TOTAL KNEE ARTHROPLASTY;  Surgeon: Ninetta Lights, MD;  Location: [place];  Service: Orthopedics;  Laterality: Left;  DR MURPHY WANTS 90 MINUTES FOR THIS CASE    There were no vitals filed for this visit.   Subjective Assessment - 07/08/18 1536    Subjective  No back pain. When she had her knee surgery she has had a limp. Patient reports her left leg pain is sudden onset for 3 months. Sometimes gets a tingling in left leg.     Patient Stated Goals  reduce pain in left leg    Currently in Pain?  Yes    Pain Score  4     Pain Location  Leg    Pain Orientation  Left    Pain Descriptors / Indicators  Sharp    Pain Type  Acute pain    Pain Onset  More than a month ago    Pain Frequency  Intermittent  Aggravating Factors   laying on back    Pain Relieving Factors  nothing    Multiple Pain Sites  No         [facility] PT Assessment - 07/08/18 0001      Assessment   Medical Diagnosis  M79.605 Left leg pain    Referring Provider (PT)  Dr. Kathyrn Lass    Onset Date/Surgical Date  04/08/18    Prior Therapy  None      Precautions   Precautions  None      Restrictions   Weight Bearing Restrictions  No      Balance Screen   Has the patient fallen in the past 6 months  No    Has the patient had a decrease in activity level because of a fear of falling?   No    Is the patient reluctant to leave their home because of a fear of falling?   No      Home Film/video editor residence      Prior Function   Level of Independence  Independent    Leisure  walking has not done in 3 weeks ago due to toenail removed      Cognition   Overall Cognitive Status  Within Functional Limits for tasks assessed      Observation/Other Assessments    Focus on Therapeutic Outcomes (FOTO)   61% limitation; goal is 48% limitation      Posture/Postural Control   Posture/Postural Control  Postural limitations    Posture Comments  holds head to the right      ROM / Strength   AROM / PROM / Strength  PROM;AROM;Strength      AROM   Lumbar Flexion  decreased by 25% with less movement in the lumbar spine    Lumbar Extension  decreased by 25%    Lumbar - Right Side Bend  decreased by 50%    Lumbar - Left Side Bend  decreased by 50%      Strength   Right Hip Extension  4-/5    Right Hip ABduction  3+/5    Left Hip Flexion  4/5    Left Hip Extension  4-/5    Left Hip ABduction  3+/5      Palpation   SI assessment   decreased mobility of T10-L5    Palpation comment  tightness in the lumbar paraspinals      Ambulation/Gait   Gait Pattern  Decreased step length - right;Decreased step length - left;Decreased dorsiflexion - right;Decreased dorsiflexion - left                Objective measurements completed on examination: See above findings.              PT Education - 07/08/18 1612    Education Details  Access Code: [email or web address] ; informaiton on dry needling, education on lumbar stenosis and disc herniation    Person(s) Educated  Patient    Methods  Explanation;Demonstration;Verbal cues;Handout    Comprehension  Returned demonstration;Verbalized understanding       PT Short Term Goals - 07/08/18 1618      PT SHORT TERM GOAL #1   Title  independent with initial HEP    Time  4    Period  Weeks    Status  New    Target Date  08/05/18      PT SHORT TERM GOAL #2   Title  --  Baseline  ---      PT SHORT TERM GOAL #3   Title  ---      PT SHORT TERM GOAL #4   Title  ---        PT Long Term Goals - 07/08/18 1606      PT LONG TERM GOAL #1   Title  independent with HEP    Time  8    Period  Weeks    Status  New    Target Date  09/02/18      PT LONG TERM GOAL #2   Title  pain in left leg  decreased  >/= 50% due to increased activity     Time  8    Period  Weeks    Status  New    Target Date  09/02/18      PT LONG TERM GOAL #3   Title  moving in bed with back pain decreased >/= 50% due to improved flexibility    Time  8    Period  Weeks    Status  New    Target Date  09/02/18      PT LONG TERM GOAL #4   Title  lifting items in the dishwasher with greater ease due to increased in back strength    Time  8    Period  Weeks    Status  New    Target Date  09/02/18      PT LONG TERM GOAL #5   Title  FOTO score </= 48% limitation    Baseline  45% limited    Time  8    Period  Weeks    Status  New    Target Date  09/02/18             Plan - 07/08/18 1613    Clinical Impression Statement  Patient is a 67 year old female with sudden onset of left hip pain 3 months ago. Patient reports her pain level is intermittent at 4/10 with laying in bed. Patient lumbar ROM is limited by 50% with increased tightness in the lumbar spine and fascia. Patient hip strength bilaterally is 3-4/5. Patient has difficulty with pushing a vacuum and unloading the dishwasher due to pain. Patient walks with decreased heel strike and step length  while holding her head to the left. Patient will benefit from skilled therapy to improve her mobility and strength to perfrom her daily tasks.     History and Personal Factors relevant to plan of care:  none pertinent    Clinical Presentation  Stable    Clinical Presentation due to:  stable condition    Clinical Decision Making  Low    Rehab Potential  Excellent    Clinical Impairments Affecting Rehab Potential  none    PT Frequency  2x / week    PT Duration  8 weeks    PT Treatment/Interventions  Cryotherapy;Electrical Stimulation;Moist Heat;Ultrasound;Therapeutic exercise;Therapeutic activities;Neuromuscular re-education;Patient/family education;Manual techniques;Passive range of motion;Dry needling    PT Next Visit Plan  dry needling  to lumbar, lumbar joint  mobilization, soft tissue work to lumbar, lumbar stabilization, hip strength, lumbar mobility exercises, stretches    PT Home Exercise Plan  Access Code: [email or web address]     Consulted and Agree with Plan of Care  Patient       Patient will benefit from skilled therapeutic intervention in order to improve the following deficits and impairments:  Abnormal gait, Increased fascial restricitons, Pain, Decreased mobility, Increased  muscle spasms, Decreased activity tolerance, Decreased range of motion, Impaired flexibility  Visit Diagnosis: Chronic midline low back pain without sciatica - Plan: PT plan of care cert/re-cert  Pain in left hip - Plan: PT plan of care cert/re-cert  Muscle weakness (generalized) - Plan: PT plan of care cert/re-cert     Problem List Patient Active Problem List   Diagnosis Date Noted  . Obstructive sleep apnea 09/01/2013  . Pure hypercholesterolemia 09/01/2013  . Coronary atherosclerosis of native coronary artery 09/01/2013  . Diastolic dysfunction 32/44/0102  . Essential hypertension, benign 09/01/2013  . Left Achilles tendinitis 01/23/2013    Earlie Counts, PT 07/08/18 4:21 PM    [facility] [address] [place], [place], [address] Phone: [phone]   Fax:  [phone]  Name: [name] MRN: [id number] Date of Birth: 08/22/1950

## 2018-07-08 NOTE — Patient Instructions (Addendum)
Trigger Point Dry Needling  . What is Trigger Point Dry Needling (DN)? o DN is a physical therapy technique used to treat muscle pain and dysfunction. Specifically, DN helps deactivate muscle trigger points (muscle knots).  o A thin filiform needle is used to penetrate the skin and stimulate the underlying trigger point. The goal is for a local twitch response (LTR) to occur and for the trigger point to relax. No medication of any kind is injected during the procedure.   . What Does Trigger Point Dry Needling Feel Like?  o The procedure feels different for each individual patient. Some patients report that they do not actually feel the needle enter the skin and overall the process is not painful. Very mild bleeding may occur. However, many patients feel a deep cramping in the muscle in which the needle was inserted. This is the local twitch response.   Marland Kitchen How Will I feel after the treatment? o Soreness is normal, and the onset of soreness may not occur for a few hours. Typically this soreness does not last longer than two days.  o Bruising is uncommon, however; ice can be used to decrease any possible bruising.  o In rare cases feeling tired or nauseous after the treatment is normal. In addition, your symptoms may get worse before they get better, this period will typically not last longer than 24 hours.   . What Can I do After My Treatment? o Increase your hydration by drinking more water for the next 24 hours. o You may place ice or heat on the areas treated that have become sore, however, do not use heat on inflamed or bruised areas. Heat often brings more relief post needling. o You can continue your regular activities, but vigorous activity is not recommended initially after the treatment for 24 hours. o DN is best combined with other physical therapy such as strengthening, stretching, and other therapies.    [facility] [address], [place], [place]  [address] Phone # [phone] Fax [phone] Access Code: [email or web address]  URL: [email or web address]  Date: 07/08/2018  Prepared by: Earlie Counts   Exercises  Seated Hamstring Stretch - 2 reps - 1 sets - 30 sec hold - 1x daily - 7x weekly  Seated Piriformis Stretch with Trunk Bend - 2 reps - 1 sets - 30 sec hold - 1x daily - 7x weekly  Seated Trunk Rotation Stretch - 2 reps - 1 sets - 15 sec hold - 1x daily - 7x weekly  Patient Education  Trigger Point Dry Needling  Lumbar Stenosis  Lumbar Disk Herniation

## 2018-07-10 ENCOUNTER — Ambulatory Visit: Payer: Medicare Other | Admitting: Physical Therapy

## 2018-07-10 DIAGNOSIS — M545 Low back pain, unspecified: Secondary | ICD-10-CM

## 2018-07-10 DIAGNOSIS — M6281 Muscle weakness (generalized): Secondary | ICD-10-CM

## 2018-07-10 DIAGNOSIS — R2689 Other abnormalities of gait and mobility: Secondary | ICD-10-CM

## 2018-07-10 DIAGNOSIS — G8929 Other chronic pain: Secondary | ICD-10-CM

## 2018-07-10 DIAGNOSIS — M25552 Pain in left hip: Secondary | ICD-10-CM

## 2018-07-10 NOTE — Therapy (Signed)
[facility] [address], [place], [place], [address] Phone: [phone]   Fax:  [phone]  Physical Therapy Treatment  Patient Details  Name: [name] MRN: [id number] Date of Birth: 1951-03-27 Referring Provider (PT): Dr. Kathyrn Lass   Encounter Date: 07/10/2018  PT End of Session - 07/10/18 1326    Visit Number  2    Date for PT Re-Evaluation  09/02/18    Authorization Type  UHC medicare    PT Start Time  1237    PT Stop Time  1330    PT Time Calculation (min)  53 min    Activity Tolerance  Patient tolerated treatment well    Behavior During Therapy  [facility] for tasks assessed/performed       Past Medical History:  Diagnosis Date  . Arthritis   . Chronic fatigue    and weakness  . Colon polyps   . Coronary artery disease    nonobstructive with 20% OM by cath 2006  . Depression   . Diabetes mellitus without complication ([place])   . Diastolic dysfunction    w Elevated LVEDP  . DJD (degenerative joint disease)    C3/4 Dr Sherwood Gambler  . GERD (gastroesophageal reflux disease)   . H/O hiatal hernia   . High cholesterol   . Hypertension   . Hypoglycemia   . Iron deficiency anemia   . Kidney stones   . Multiple thyroid nodules   . Neurocardiogenic syncope   . Obesity   . Prediabetes 2011 & 2012  . Right knee pain 12/2009   Dr Marlou Sa  . Sleep apnea    CPAP had Sleep Study ordered by Dr. Radford Pax  . Tick bite 11/2016  . Vasovagal syncope     Past Surgical History:  Procedure Laterality Date  . ACHILLES TENDON SURGERY Left 01/23/2013   Procedure: EXCISION PARTIAL BONE TALUS/CALCANEUS, REPAIR RUPTURE ACHILLES TENDON PRIMARY OPEN ;  Surgeon: Ninetta Lights, MD;  Location: [place];  Service: Orthopedics;  Laterality: Left;  . ANTERIOR FUSION CERVICAL SPINE     x2-cervical  . BREAST CYST EXCISION Left   . CARDIAC CATHETERIZATION  ~ 2008   Nonobstructive ASCAD, 20% OM  . CARPAL TUNNEL  RELEASE Left   . LITHOTRIPSY    . OTHER SURGICAL HISTORY  06/12/2018   Left big toe nail surgery. [facility]   . TONSILLECTOMY    . TOTAL KNEE ARTHROPLASTY Right 11/2010  . TOTAL KNEE ARTHROPLASTY  12/13/2011   Procedure: TOTAL KNEE ARTHROPLASTY;  Surgeon: Ninetta Lights, MD;  Location: [place];  Service: Orthopedics;  Laterality: Left;  DR MURPHY WANTS 90 MINUTES FOR THIS CASE    There were no vitals filed for this visit.  Subjective Assessment - 07/10/18 1242    Subjective  Pt arrving today reporting tingling, itching, and burning down L LE. Pt reporting she usually walks but she hasn't been able to walk due to toe rail removal.     Patient Stated Goals  reduce pain in left leg    Currently in Pain?  Yes    Pain Score  2     Pain Location  Back    Pain Orientation  Left    Pain Descriptors / Indicators  Aching;Dull    Pain Type  Acute pain    Pain Onset  More than a month ago  [facility] Adult PT Treatment/Exercise - 07/10/18 0001      Exercises   Exercises  Lumbar      Lumbar Exercises: Stretches   Active Hamstring Stretch  Right;Left;2 reps;30 seconds      Lumbar Exercises: Supine   Ab Set  5 reps;5 seconds    Clam  10 reps;5 seconds    Bridge  10 reps    Straight Leg Raise  10 reps;2 seconds    Other Supine Lumbar Exercises  PPT x 10 holding 5 seconds each    Other Supine Lumbar Exercises  supine marching x 20 reps       Modalities   Modalities  Moist Heat      Moist Heat Therapy   Number Minutes Moist Heat  10 Minutes    Moist Heat Location  Lumbar Spine      Manual Therapy   Manual Therapy  Soft tissue mobilization    Manual therapy comments  x 15 minutes    Soft tissue mobilization  STM to lumbar paraspinals and QL bilaterally, and L piriformis             PT Education - 07/10/18 1326    Education Details  Importance of mobilty and stretching    Person(s) Educated  Patient    Methods  Explanation     Comprehension  Verbalized understanding       PT Short Term Goals - 07/10/18 1343      PT SHORT TERM GOAL #1   Title  independent with initial HEP    Time  4    Period  Weeks    Status  On-going        PT Long Term Goals - 07/08/18 1606      PT LONG TERM GOAL #1   Title  independent with HEP    Time  8    Period  Weeks    Status  New    Target Date  09/02/18      PT LONG TERM GOAL #2   Title  pain in left leg  decreased >/= 50% due to increased activity     Time  8    Period  Weeks    Status  New    Target Date  09/02/18      PT LONG TERM GOAL #3   Title  moving in bed with back pain decreased >/= 50% due to improved flexibility    Time  8    Period  Weeks    Status  New    Target Date  09/02/18      PT LONG TERM GOAL #4   Title  lifting items in the dishwasher with greater ease due to increased in back strength    Time  8    Period  Weeks    Status  New    Target Date  09/02/18      PT LONG TERM GOAL #5   Title  FOTO score </= 48% limitation    Baseline  45% limited    Time  8    Period  Weeks    Status  New    Target Date  09/02/18              Patient will benefit from skilled therapeutic intervention in order to improve the following deficits and impairments:     Visit Diagnosis: Chronic midline low back pain without sciatica  Pain in left hip  Muscle weakness (generalized)  Chronic  bilateral low back pain without sciatica  Other abnormalities of gait and mobility     Problem List Patient Active Problem List   Diagnosis Date Noted  . Obstructive sleep apnea 09/01/2013  . Pure hypercholesterolemia 09/01/2013  . Coronary atherosclerosis of native coronary artery 09/01/2013  . Diastolic dysfunction 63/84/5364  . Essential hypertension, benign 09/01/2013  . Left Achilles tendinitis 01/23/2013    Oretha Caprice, PT 07/10/2018, 1:44 PM  [facility] [address] [place], [place], [address] Phone: [phone]   Fax:  [phone]  Name: [name] MRN: [id number] Date of Birth: 09-17-1950

## 2018-07-15 ENCOUNTER — Encounter: Payer: Medicare Other | Admitting: Physical Therapy

## 2018-07-17 ENCOUNTER — Encounter: Payer: Self-pay | Admitting: Physical Therapy

## 2018-07-17 ENCOUNTER — Ambulatory Visit: Payer: Medicare Other | Admitting: Physical Therapy

## 2018-07-17 DIAGNOSIS — M6281 Muscle weakness (generalized): Secondary | ICD-10-CM

## 2018-07-17 DIAGNOSIS — G8929 Other chronic pain: Secondary | ICD-10-CM

## 2018-07-17 DIAGNOSIS — M545 Low back pain, unspecified: Secondary | ICD-10-CM

## 2018-07-17 DIAGNOSIS — M25552 Pain in left hip: Secondary | ICD-10-CM

## 2018-07-17 NOTE — Patient Instructions (Signed)
Access Code: [email or web address]  URL: [email or web address]  Date: 07/17/2018  Prepared by: Venetia Night Beuhring   Exercises  Seated Hamstring Stretch - 2 reps - 1 sets - 30 sec hold - 1x daily - 7x weekly  Seated Piriformis Stretch with Trunk Bend - 2 reps - 1 sets - 30 sec hold - 1x daily - 7x weekly  Seated Trunk Rotation Stretch - 2 reps - 1 sets - 30 sec hold - 1x daily - 7x weekly  Seated Thoracic Flexion and Rotation with Swiss Ball - 5 reps - 1 sets - 5 hold - 1x daily - 7x weekly  Straight Leg Raise - 10 reps - 2 sets - 1x daily - 7x weekly  Patient Education  Trigger Point Dry Needling  Lumbar Stenosis  Lumbar Disk Herniation

## 2018-07-17 NOTE — Therapy (Signed)
[facility] [address] [place], [place], [address] Phone: [phone]   Fax:  [phone]  Physical Therapy Treatment  Patient Details  Name: [name] MRN: [id number] Date of Birth: June 11, 1951 Referring Provider (PT): Dr. Kathyrn Lass   Encounter Date: 07/17/2018  PT End of Session - 07/17/18 1414    Visit Number  3    Date for PT Re-Evaluation  09/02/18    Authorization Type  [employer] medicare    PT Start Time  1405   Pt arrived late   PT Stop Time  1446    PT Time Calculation (min)  41 min    Activity Tolerance  Patient tolerated treatment well    Behavior During Therapy  [facility] for tasks assessed/performed       Past Medical History:  Diagnosis Date  . Arthritis   . Chronic fatigue    and weakness  . Colon polyps   . Coronary artery disease    nonobstructive with 20% OM by cath 2006  . Depression   . Diabetes mellitus without complication ([place])   . Diastolic dysfunction    w Elevated LVEDP  . DJD (degenerative joint disease)    C3/4 Dr Sherwood Gambler  . GERD (gastroesophageal reflux disease)   . H/O hiatal hernia   . High cholesterol   . Hypertension   . Hypoglycemia   . Iron deficiency anemia   . Kidney stones   . Multiple thyroid nodules   . Neurocardiogenic syncope   . Obesity   . Prediabetes 2011 & 2012  . Right knee pain 12/2009   Dr Marlou Sa  . Sleep apnea    CPAP had Sleep Study ordered by Dr. Radford Pax  . Tick bite 11/2016  . Vasovagal syncope     Past Surgical History:  Procedure Laterality Date  . ACHILLES TENDON SURGERY Left 01/23/2013   Procedure: EXCISION PARTIAL BONE TALUS/CALCANEUS, REPAIR RUPTURE ACHILLES TENDON PRIMARY OPEN ;  Surgeon: Ninetta Lights, MD;  Location: [place];  Service: Orthopedics;  Laterality: Left;  . ANTERIOR FUSION CERVICAL SPINE     x2-cervical  . BREAST CYST EXCISION Left   . CARDIAC CATHETERIZATION  ~ 2008   Nonobstructive ASCAD, 20% OM   . CARPAL TUNNEL RELEASE Left   . LITHOTRIPSY    . OTHER SURGICAL HISTORY  06/12/2018   Left big toe nail surgery. [facility]   . TONSILLECTOMY    . TOTAL KNEE ARTHROPLASTY Right 11/2010  . TOTAL KNEE ARTHROPLASTY  12/13/2011   Procedure: TOTAL KNEE ARTHROPLASTY;  Surgeon: Ninetta Lights, MD;  Location: [place];  Service: Orthopedics;  Laterality: Left;  DR MURPHY WANTS 90 MINUTES FOR THIS CASE    There were no vitals filed for this visit.  Subjective Assessment - 07/17/18 1410    Subjective  Lt leg has less pain but the itching is driving her crazy.  Itching is located in anterior lateral left thigh.     Patient Stated Goals  reduce pain in left leg    Currently in Pain?  No/denies    Pain Onset  More than a month ago                       [facility] Adult PT Treatment/Exercise - 07/17/18 0001      Exercises   Exercises  Lumbar;Knee/Hip      Lumbar Exercises: Stretches   Active Hamstring Stretch  Right;Left;2 reps;30 seconds  Active Hamstring Stretch Limitations  seated    Figure 4 Stretch  1 rep;30 seconds;Seated   bil, with trunk bend   Other Lumbar Stretch Exercise  seated rotation x 30 sec bil 1 rep each    Other Lumbar Stretch Exercise  seated physioball rollouts Lt/flexion, Rt/flexion seated      Lumbar Exercises: Supine   Straight Leg Raise  10 reps   bil     Manual Therapy   Manual Therapy  Soft tissue mobilization    Soft tissue mobilization  Lt lumbar and SI region s/p dry needling       Trigger Point Dry Needling - 07/17/18 1446    Consent Given?  Yes    Muscles Treated Lower Body  --   deep multifidus on Lt L4-S1          PT Education - 07/17/18 1444    Education Details  Access Code: [email or web address]    Person(s) Educated  Patient    Methods  Explanation;Handout;Verbal cues    Comprehension  Verbalized understanding;Returned demonstration;Verbal cues required       PT Short Term Goals - 07/10/18 1343      PT SHORT TERM GOAL #1    Title  independent with initial HEP    Time  4    Period  Weeks    Status  On-going        PT Long Term Goals - 07/08/18 1606      PT LONG TERM GOAL #1   Title  independent with HEP    Time  8    Period  Weeks    Status  New    Target Date  09/02/18      PT LONG TERM GOAL #2   Title  pain in left leg  decreased >/= 50% due to increased activity     Time  8    Period  Weeks    Status  New    Target Date  09/02/18      PT LONG TERM GOAL #3   Title  moving in bed with back pain decreased >/= 50% due to improved flexibility    Time  8    Period  Weeks    Status  New    Target Date  09/02/18      PT LONG TERM GOAL #4   Title  lifting items in the dishwasher with greater ease due to increased in back strength    Time  8    Period  Weeks    Status  New    Target Date  09/02/18      PT LONG TERM GOAL #5   Title  FOTO score </= 48% limitation    Baseline  45% limited    Time  8    Period  Weeks    Status  New    Target Date  09/02/18            Plan - 07/17/18 1449    Clinical Impression Statement  Pt with good tolerance of dry needling.  Needed verbal cueing and demo of HEP with new handout as Pt didn't recall exercises or know where handout was.  PT recommended using bed adjustments to elevate legs to promote lumbar flexion while back sleeping to reduce Lt LE symptoms.  Pt will continue to benefit from dry needling, stretching and core stabilization to addres deficits along POC.    Rehab Potential  Excellent    Clinical Impairments Affecting Rehab  Potential  none    PT Frequency  2x / week    PT Duration  8 weeks    PT Treatment/Interventions  Cryotherapy;Electrical Stimulation;Moist Heat;Ultrasound;Therapeutic exercise;Therapeutic activities;Neuromuscular re-education;Patient/family education;Manual techniques;Passive range of motion;Dry needling    PT Next Visit Plan  f/u on HEP, dry needling  to lumbar, lumbar joint mobilization, soft tissue work to lumbar,  lumbar stabilization, hip strength, lumbar mobility exercises, stretches    PT Home Exercise Plan  Access Code: [email or web address]     Consulted and Agree with Plan of Care  Patient       Patient will benefit from skilled therapeutic intervention in order to improve the following deficits and impairments:  Abnormal gait, Increased fascial restricitons, Pain, Decreased mobility, Increased muscle spasms, Decreased activity tolerance, Decreased range of motion, Impaired flexibility  Visit Diagnosis: Chronic midline low back pain without sciatica  Pain in left hip  Muscle weakness (generalized)  Chronic bilateral low back pain without sciatica     Problem List Patient Active Problem List   Diagnosis Date Noted  . Obstructive sleep apnea 09/01/2013  . Pure hypercholesterolemia 09/01/2013  . Coronary atherosclerosis of native coronary artery 09/01/2013  . Diastolic dysfunction 41/32/4401  . Essential hypertension, benign 09/01/2013  . Left Achilles tendinitis 01/23/2013   Baruch Merl, PT 07/17/18 2:53 PM    [facility] [address] [place], [place], [address] Phone: [phone]   Fax:  [phone]  Name: [name] MRN: [id number] Date of Birth: [birth year]

## 2018-08-02 ENCOUNTER — Ambulatory Visit: Payer: Medicare Other | Admitting: Physical Therapy

## 2018-08-06 ENCOUNTER — Ambulatory Visit: Payer: Medicare Other | Attending: Family Medicine | Admitting: Physical Therapy

## 2018-08-06 ENCOUNTER — Encounter: Payer: Self-pay | Admitting: Physical Therapy

## 2018-08-06 DIAGNOSIS — R2689 Other abnormalities of gait and mobility: Secondary | ICD-10-CM | POA: Insufficient documentation

## 2018-08-06 DIAGNOSIS — M6281 Muscle weakness (generalized): Secondary | ICD-10-CM | POA: Diagnosis present

## 2018-08-06 DIAGNOSIS — M25552 Pain in left hip: Secondary | ICD-10-CM | POA: Insufficient documentation

## 2018-08-06 DIAGNOSIS — G8929 Other chronic pain: Secondary | ICD-10-CM | POA: Insufficient documentation

## 2018-08-06 DIAGNOSIS — M545 Low back pain, unspecified: Secondary | ICD-10-CM

## 2018-08-06 NOTE — Patient Instructions (Signed)
Access Code: [email or web address]  URL: [email or web address]  Date: 08/06/2018  Prepared by: Earlie Counts   Exercises  Seated Hamstring Stretch - 2 reps - 1 sets - 30 sec hold - 1x daily - 7x weekly  Seated Piriformis Stretch with Trunk Bend - 2 reps - 1 sets - 30 sec hold - 1x daily - 7x weekly  Seated Trunk Rotation Stretch - 2 reps - 1 sets - 30 sec hold - 1x daily - 7x weekly  Straight Leg Raise - 10 reps - 2 sets - 1x daily - 7x weekly  Hooklying Hamstring Stretch with Strap - 1 reps - 1 sets - 30 sec hold hold - 1x daily - 7x weekly  Patient Education  Trigger Point Dry Needling  Lumbar Stenosis  Lumbar Disk Herniation [facility] [address], [place], [place] [address] Phone # [phone] Fax [phone]

## 2018-08-06 NOTE — Therapy (Signed)
[facility] [address] [place], [place], [address] Phone: [phone]   Fax:  [phone]  Physical Therapy Treatment  Patient Details  Name: [name] MRN: [id number] Date of Birth: 10-Mar-1951 Referring Provider (PT): Dr. Kathyrn Lass   Encounter Date: 08/06/2018  PT End of Session - 08/06/18 1713    Visit Number  4    Date for PT Re-Evaluation  09/02/18    Authorization Type  [employer]HC medicare    PT Start Time  1400    PT Stop Time  1445    PT Time Calculation (min)  45 min    Activity Tolerance  Patient tolerated treatment well    Behavior During Therapy  [facility] for tasks assessed/performed       Past Medical History:  Diagnosis Date  . Arthritis   . Chronic fatigue    and weakness  . Colon polyps   . Coronary artery disease    nonobstructive with 20% OM by cath 2006  . Depression   . Diabetes mellitus without complication ([place])   . Diastolic dysfunction    w Elevated LVEDP  . DJD (degenerative joint disease)    C3/4 Dr Sherwood Gambler  . GERD (gastroesophageal reflux disease)   . H/O hiatal hernia   . High cholesterol   . Hypertension   . Hypoglycemia   . Iron deficiency anemia   . Kidney stones   . Multiple thyroid nodules   . Neurocardiogenic syncope   . Obesity   . Prediabetes 2011 & 2012  . Right knee pain 12/2009   Dr Marlou Sa  . Sleep apnea    CPAP had Sleep Study ordered by Dr. Radford Pax  . Tick bite 11/2016  . Vasovagal syncope     Past Surgical History:  Procedure Laterality Date  . ACHILLES TENDON SURGERY Left 01/23/2013   Procedure: EXCISION PARTIAL BONE TALUS/CALCANEUS, REPAIR RUPTURE ACHILLES TENDON PRIMARY OPEN ;  Surgeon: Ninetta Lights, MD;  Location: [place];  Service: Orthopedics;  Laterality: Left;  . ANTERIOR FUSION CERVICAL SPINE     x2-cervical  . BREAST CYST EXCISION Left   . CARDIAC CATHETERIZATION  ~ 2008   Nonobstructive ASCAD, 20% OM  . CARPAL TUNNEL  RELEASE Left   . LITHOTRIPSY    . OTHER SURGICAL HISTORY  06/12/2018   Left big toe nail surgery. [facility]   . TONSILLECTOMY    . TOTAL KNEE ARTHROPLASTY Right 11/2010  . TOTAL KNEE ARTHROPLASTY  12/13/2011   Procedure: TOTAL KNEE ARTHROPLASTY;  Surgeon: Ninetta Lights, MD;  Location: [place];  Service: Orthopedics;  Laterality: Left;  DR MURPHY WANTS 90 MINUTES FOR THIS CASE    There were no vitals filed for this visit.  Subjective Assessment - 08/06/18 1410    Subjective  The dry needling stopped the itching. I did not come due to catching a cold. The left leg is the same. Time it is really bad is when I am in bed at night. When layin on my back and turn left foot inward the pain is ont there. No tingling or itching.     Patient Stated Goals  reduce pain in left leg    Currently in Pain?  Yes    Pain Score  2     Pain Location  Hip    Pain Orientation  Left    Pain Descriptors / Indicators  Aching;Dull    Pain Type  Acute pain  Pain Onset  More than a month ago    Pain Frequency  Intermittent    Aggravating Factors   laying on back    Pain Relieving Factors  turn left foot inward    Multiple Pain Sites  No         [facility] PT Assessment - 08/06/18 0001      Strength   Right Hip Extension  4/5    Right Hip ABduction  4/5    Left Hip Flexion  4+/5    Left Hip Extension  4/5    Left Hip ABduction  4-/5                   [facility] Adult PT Treatment/Exercise - 08/06/18 0001      Lumbar Exercises: Stretches   Active Hamstring Stretch  Left;5 reps   5 sec; left foot turned inward   Active Hamstring Stretch Limitations  seated, neural tension stretch    Passive Hamstring Stretch  Left;1 rep;30 seconds   supine with strap   Passive Hamstring Stretch Limitations  moving the left foot inward and left hp adduction to perform neural tension stretch      Lumbar Exercises: Aerobic   Nustep  Seat #9, arm #10, level 1 for 6 min      Lumbar Exercises: Supine    Straight Leg Raise  20 reps   bil     Manual Therapy   Manual Therapy  Soft tissue mobilization    Manual therapy comments  using alladay with blue head to work on the lumbar paraspinals, quadratus, and left gluteal muscles in prone       Trigger Point Dry Needling - 08/06/18 1433    Consent Given?  Yes    Muscles Treated Upper Body  Quadratus Lumborum   left L1-L5 multifidi   Muscles Treated Lower Body  Gluteus minimus;Gluteus maximus    Gluteus Maximus Response  Twitch response elicited;Palpable increased muscle length    Gluteus Minimus Response  Twitch response elicited;Palpable increased muscle length           PT Education - 08/06/18 1433    Education Details  Access Code: [email or web address]     Person(s) Educated  Patient    Methods  Explanation;Demonstration;Verbal cues;Handout    Comprehension  Returned demonstration;Verbalized understanding       PT Short Term Goals - 08/06/18 1715      PT SHORT TERM GOAL #1   Title  independent with initial HEP    Time  4    Period  Weeks    Status  On-going        PT Long Term Goals - 07/08/18 1606      PT LONG TERM GOAL #1   Title  independent with HEP    Time  8    Period  Weeks    Status  New    Target Date  09/02/18      PT LONG TERM GOAL #2   Title  pain in left leg  decreased >/= 50% due to increased activity     Time  8    Period  Weeks    Status  New    Target Date  09/02/18      PT LONG TERM GOAL #3   Title  moving in bed with back pain decreased >/= 50% due to improved flexibility    Time  8    Period  Weeks    Status  New  Target Date  09/02/18      PT LONG TERM GOAL #4   Title  lifting items in the dishwasher with greater ease due to increased in back strength    Time  8    Period  Weeks    Status  New    Target Date  09/02/18      PT LONG TERM GOAL #5   Title  FOTO score </= 48% limitation    Baseline  45% limited    Time  8    Period  Weeks    Status  New    Target Date  09/02/18             Plan - 08/06/18 1420    Clinical Impression Statement  Patient is not having the tingling in left hip since last visit. Patient had no pain after manual work. Patient had tight left paraspinals. Patient has not met goals at this time. Patient needed to have HEP reviewed. Patient reports she is able to take away her pain with laying supine and turning left foot inward. Patient will benefit from skilled therapy to perform dry needling, stretching and core stabilization.     Rehab Potential  Excellent    Clinical Impairments Affecting Rehab Potential  none    PT Frequency  2x / week    PT Duration  8 weeks    PT Treatment/Interventions  Cryotherapy;Electrical Stimulation;Moist Heat;Ultrasound;Therapeutic exercise;Therapeutic activities;Neuromuscular re-education;Patient/family education;Manual techniques;Passive range of motion;Dry needling    PT Next Visit Plan  lumbar joint mobilization, soft tissue work to lumbar, lumbar stabilization, hip strength, lumbar mobility exercises, stretches    PT Home Exercise Plan  Access Code: [email or web address]     Consulted and Agree with Plan of Care  Patient       Patient will benefit from skilled therapeutic intervention in order to improve the following deficits and impairments:  Abnormal gait, Increased fascial restricitons, Pain, Decreased mobility, Increased muscle spasms, Decreased activity tolerance, Decreased range of motion, Impaired flexibility  Visit Diagnosis: Chronic midline low back pain without sciatica  Pain in left hip  Muscle weakness (generalized)  Chronic bilateral low back pain without sciatica     Problem List Patient Active Problem List   Diagnosis Date Noted  . Obstructive sleep apnea 09/01/2013  . Pure hypercholesterolemia 09/01/2013  . Coronary atherosclerosis of native coronary artery 09/01/2013  . Diastolic dysfunction 28/83/3744  . Essential hypertension, benign 09/01/2013  . Left Achilles tendinitis 01/23/2013     Earlie Counts, PT 08/06/18 5:16 PM   [facility] [address] [place], [place], [address] Phone: [phone]   Fax:  [phone]  Name: [name] MRN: [id number] Date of Birth: [birth year]

## 2018-08-08 ENCOUNTER — Ambulatory Visit: Payer: Medicare Other | Admitting: Physical Therapy

## 2018-08-08 ENCOUNTER — Encounter: Payer: Self-pay | Admitting: Physical Therapy

## 2018-08-08 DIAGNOSIS — M545 Low back pain, unspecified: Secondary | ICD-10-CM

## 2018-08-08 DIAGNOSIS — M25552 Pain in left hip: Secondary | ICD-10-CM

## 2018-08-08 DIAGNOSIS — G8929 Other chronic pain: Secondary | ICD-10-CM

## 2018-08-08 DIAGNOSIS — M6281 Muscle weakness (generalized): Secondary | ICD-10-CM

## 2018-08-08 NOTE — Therapy (Signed)
[facility] [address], [place], [place], [address] Phone: [phone]   Fax:  [phone]  Physical Therapy Treatment  Patient Details  Name: [name] MRN: [id number] Date of Birth: May 25, 1951 Referring Provider (PT): Dr. Kathyrn Lass   Encounter Date: 08/08/2018  PT End of Session - 08/08/18 1405    Visit Number  5    Date for PT Re-Evaluation  09/02/18    Authorization Type  [employer] medicare    PT Start Time  6962    PT Stop Time  1443    PT Time Calculation (min)  38 min    Activity Tolerance  Patient tolerated treatment well    Behavior During Therapy  [facility] for tasks assessed/performed       Past Medical History:  Diagnosis Date  . Arthritis   . Chronic fatigue    and weakness  . Colon polyps   . Coronary artery disease    nonobstructive with 20% OM by cath 2006  . Depression   . Diabetes mellitus without complication ([place])   . Diastolic dysfunction    w Elevated LVEDP  . DJD (degenerative joint disease)    C3/4 Dr Sherwood Gambler  . GERD (gastroesophageal reflux disease)   . H/O hiatal hernia   . High cholesterol   . Hypertension   . Hypoglycemia   . Iron deficiency anemia   . Kidney stones   . Multiple thyroid nodules   . Neurocardiogenic syncope   . Obesity   . Prediabetes 2011 & 2012  . Right knee pain 12/2009   Dr Marlou Sa  . Sleep apnea    CPAP had Sleep Study ordered by Dr. Radford Pax  . Tick bite 11/2016  . Vasovagal syncope     Past Surgical History:  Procedure Laterality Date  . ACHILLES TENDON SURGERY Left 01/23/2013   Procedure: EXCISION PARTIAL BONE TALUS/CALCANEUS, REPAIR RUPTURE ACHILLES TENDON PRIMARY OPEN ;  Surgeon: Ninetta Lights, MD;  Location: [place];  Service: Orthopedics;  Laterality: Left;  . ANTERIOR FUSION CERVICAL SPINE     x2-cervical  . BREAST CYST EXCISION Left   . CARDIAC CATHETERIZATION  ~ 2008   Nonobstructive ASCAD, 20% OM  . CARPAL TUNNEL  RELEASE Left   . LITHOTRIPSY    . OTHER SURGICAL HISTORY  06/12/2018   Left big toe nail surgery. [facility]   . TONSILLECTOMY    . TOTAL KNEE ARTHROPLASTY Right 11/2010  . TOTAL KNEE ARTHROPLASTY  12/13/2011   Procedure: TOTAL KNEE ARTHROPLASTY;  Surgeon: Ninetta Lights, MD;  Location: [place];  Service: Orthopedics;  Laterality: Left;  DR MURPHY WANTS 90 MINUTES FOR THIS CASE    There were no vitals filed for this visit.  Subjective Assessment - 08/08/18 1410    Subjective  The dry needling. The stretches are helping. I am still on the Meloxicam.     Patient Stated Goals  reduce pain in left leg    Currently in Pain?  Yes    Pain Score  1     Pain Location  Hip    Pain Orientation  Left    Pain Descriptors / Indicators  Aching;Tingling    Pain Type  Acute pain    Pain Onset  More than a month ago    Pain Frequency  Intermittent    Aggravating Factors   laying on back    Pain Relieving Factors  turn left foot inward  Multiple Pain Sites  No                       [facility] Adult PT Treatment/Exercise - 08/08/18 0001      Lumbar Exercises: Stretches   Passive Hamstring Stretch  Left;5 reps   therapist pulling left leg up and over in supine   Piriformis Stretch  Left;5 reps;10 seconds   with contract relax   Piriformis Stretch Limitations  then stretch posterior left hip in supine with contract relax    Other Lumbar Stretch Exercise  seated physioball rollouts Lt/flexion, Rt/flexion seated      Lumbar Exercises: Aerobic   Nustep  Seat #9, arm #10, level 1 for 6 min      Lumbar Exercises: Supine   Ab Set  10 reps;5 seconds    Clam  20 reps;1 second   abdominal bracing   Bridge with Ball Squeeze  15 reps;1 second    Isometric Hip Flexion  20 reps;5 seconds   10 times each side            PT Education - 08/08/18 1438    Education Details  Access Code: [email or web address]     Person(s) Educated  Patient    Methods  Explanation;Demonstration;Verbal  cues;Handout    Comprehension  Verbalized understanding;Returned demonstration       PT Short Term Goals - 08/08/18 1443      PT SHORT TERM GOAL #1   Title  independent with initial HEP    Time  4    Period  Weeks    Status  Achieved        PT Long Term Goals - 07/08/18 1606      PT LONG TERM GOAL #1   Title  independent with HEP    Time  8    Period  Weeks    Status  New    Target Date  09/02/18      PT LONG TERM GOAL #2   Title  pain in left leg  decreased >/= 50% due to increased activity     Time  8    Period  Weeks    Status  New    Target Date  09/02/18      PT LONG TERM GOAL #3   Title  moving in bed with back pain decreased >/= 50% due to improved flexibility    Time  8    Period  Weeks    Status  New    Target Date  09/02/18      PT LONG TERM GOAL #4   Title  lifting items in the dishwasher with greater ease due to increased in back strength    Time  8    Period  Weeks    Status  New    Target Date  09/02/18      PT LONG TERM GOAL #5   Title  FOTO score </= 48% limitation    Baseline  45% limited    Time  8    Period  Weeks    Status  New    Target Date  09/02/18            Plan - 08/08/18 1406    Clinical Impression Statement  Patient is having less tingling in left hip. Patient is learning lumbar stabilization exercises to stabilize her trunk. Patient has made progress with dry needling. Patient had no tingling after therapy. Patient will benefit from skilled therapy  to perform dry needling, stretching, and core stabilization.     Rehab Potential  Excellent    Clinical Impairments Affecting Rehab Potential  none    PT Frequency  2x / week    PT Duration  8 weeks    PT Treatment/Interventions  Cryotherapy;Electrical Stimulation;Moist Heat;Ultrasound;Therapeutic exercise;Therapeutic activities;Neuromuscular re-education;Patient/family education;Manual techniques;Passive range of motion;Dry needling    PT Next Visit Plan  lumbar joint  mobilization, soft tissue work to lumbar, lumbar stabilization, check hip strength, lumbar mobility exercises, stretches    PT Home Exercise Plan  Access Code: [email or web address]     Recommended Other Services  MD signed initial eval    Consulted and Agree with Plan of Care  Patient       Patient will benefit from skilled therapeutic intervention in order to improve the following deficits and impairments:  Abnormal gait, Increased fascial restricitons, Pain, Decreased mobility, Increased muscle spasms, Decreased activity tolerance, Decreased range of motion, Impaired flexibility  Visit Diagnosis: Chronic midline low back pain without sciatica  Pain in left hip  Muscle weakness (generalized)     Problem List Patient Active Problem List   Diagnosis Date Noted  . Obstructive sleep apnea 09/01/2013  . Pure hypercholesterolemia 09/01/2013  . Coronary atherosclerosis of native coronary artery 09/01/2013  . Diastolic dysfunction 35/59/7416  . Essential hypertension, benign 09/01/2013  . Left Achilles tendinitis 01/23/2013    Earlie Counts, PT 08/08/18 2:44 PM   [facility] [address] [place], [place], [address] Phone: [phone]   Fax:  [phone]  Name: ANNAKATE SOULIER MRN: [id number] Date of Birth: 1951-05-21

## 2018-08-08 NOTE — Patient Instructions (Signed)
Access Code: [email or web address]  URL: [email or web address]  Date: 08/08/2018  Prepared by: Earlie Counts   Exercises  Seated Hamstring Stretch - 2 reps - 1 sets - 30 sec hold - 1x daily - 7x weekly  Seated Piriformis Stretch with Trunk Bend - 2 reps - 1 sets - 30 sec hold - 1x daily - 7x weekly  Seated Trunk Rotation Stretch - 2 reps - 1 sets - 30 sec hold - 1x daily - 7x weekly  Straight Leg Raise - 10 reps - 2 sets - 1x daily - 7x weekly  Hooklying Hamstring Stretch with Strap - 1 reps - 1 sets - 30 sec hold hold - 1x daily - 7x weekly  Hooklying Isometric Hip Flexion - 10 reps - 1 sets - 5 sec hold - 1x daily - 7x weekly  Bent Knee Fallouts with Alternating Legs - 10 reps - 2 sets - 1x daily - 7x weekly  Supine Bridge with Mini Swiss Ball Between Knees - 10 reps - 1 sets - 1x daily - 7x weekly  Patient Education  Trigger Point Dry Needling  Lumbar Stenosis  Lumbar Disk Herniation [facility] [address], [place], [place] [address] Phone # [phone] Fax [phone]

## 2018-08-12 ENCOUNTER — Telehealth: Payer: Self-pay | Admitting: Podiatry

## 2018-08-12 ENCOUNTER — Telehealth: Payer: Self-pay | Admitting: Physical Therapy

## 2018-08-12 ENCOUNTER — Ambulatory Visit: Payer: Medicare Other | Admitting: Physical Therapy

## 2018-08-12 NOTE — Telephone Encounter (Signed)
Pt had toenail removed about 3 months ago, has large scab that is uncomfortable. Please give pt a call.

## 2018-08-12 NOTE — Telephone Encounter (Signed)
Called patient about no-show for her appointment today at 2:00 PM.  Earlie Counts, PT @1 /13/2020@ 2:26 PM

## 2018-08-12 NOTE — Telephone Encounter (Signed)
I called pt and she states there is small area that drains and still some redness, and it catches on the bed sheet at night so she has to keep it covered. Pt states she saw her PCP today and her doctor wanted the area checked. I told pt to soak, gently rub over the scab with a clean wash cloth, and I would have scheduler call tomorrow to schedule on the nurses schedule. Pt asked if she was to use neosporin after and I told her that may help also.

## 2018-08-14 ENCOUNTER — Ambulatory Visit: Payer: Medicare Other | Admitting: Podiatry

## 2018-08-15 ENCOUNTER — Encounter: Payer: Self-pay | Admitting: Physical Therapy

## 2018-08-15 ENCOUNTER — Ambulatory Visit: Payer: Medicare Other | Admitting: Physical Therapy

## 2018-08-15 DIAGNOSIS — M545 Low back pain, unspecified: Secondary | ICD-10-CM

## 2018-08-15 DIAGNOSIS — M25552 Pain in left hip: Secondary | ICD-10-CM

## 2018-08-15 DIAGNOSIS — M6281 Muscle weakness (generalized): Secondary | ICD-10-CM

## 2018-08-15 DIAGNOSIS — G8929 Other chronic pain: Secondary | ICD-10-CM

## 2018-08-15 NOTE — Therapy (Signed)
[facility] [address], [place], [place], [address] Phone: [phone]   Fax:  [phone]  Physical Therapy Treatment  Patient Details  Name: [name] MRN: [id number] Date of Birth: 10-30-1950 Referring Provider (PT): Dr. Kathyrn Lass   Encounter Date: 08/15/2018  PT End of Session - 08/15/18 1408    Visit Number  6    Date for PT Re-Evaluation  09/02/18    Authorization Type  [employer] medicare    PT Start Time  1400    PT Stop Time  1324    PT Time Calculation (min)  45 min    Activity Tolerance  Patient tolerated treatment well;No increased pain    Behavior During Therapy  WFL for tasks assessed/performed       Past Medical History:  Diagnosis Date  . Arthritis   . Chronic fatigue    and weakness  . Colon polyps   . Coronary artery disease    nonobstructive with 20% OM by cath 2006  . Depression   . Diabetes mellitus without complication ([place])   . Diastolic dysfunction    w Elevated LVEDP  . DJD (degenerative joint disease)    C3/4 Dr Sherwood Gambler  . GERD (gastroesophageal reflux disease)   . H/O hiatal hernia   . High cholesterol   . Hypertension   . Hypoglycemia   . Iron deficiency anemia   . Kidney stones   . Multiple thyroid nodules   . Neurocardiogenic syncope   . Obesity   . Prediabetes 2011 & 2012  . Right knee pain 12/2009   Dr Marlou Sa  . Sleep apnea    CPAP had Sleep Study ordered by Dr. Radford Pax  . Tick bite 11/2016  . Vasovagal syncope     Past Surgical History:  Procedure Laterality Date  . ACHILLES TENDON SURGERY Left 01/23/2013   Procedure: EXCISION PARTIAL BONE TALUS/CALCANEUS, REPAIR RUPTURE ACHILLES TENDON PRIMARY OPEN ;  Surgeon: Ninetta Lights, MD;  Location: [facility];  Service: Orthopedics;  Laterality: Left;  . ANTERIOR FUSION CERVICAL SPINE     x2-cervical  . BREAST CYST EXCISION Left   . CARDIAC CATHETERIZATION  ~ 2008   Nonobstructive ASCAD, 20% OM   . CARPAL TUNNEL RELEASE Left   . LITHOTRIPSY    . OTHER SURGICAL HISTORY  06/12/2018   Left big toe nail surgery. [facility]   . TONSILLECTOMY    . TOTAL KNEE ARTHROPLASTY Right 11/2010  . TOTAL KNEE ARTHROPLASTY  12/13/2011   Procedure: TOTAL KNEE ARTHROPLASTY;  Surgeon: Ninetta Lights, MD;  Location: [facility];  Service: Orthopedics;  Laterality: Left;  DR MURPHY WANTS 90 MINUTES FOR THIS CASE    There were no vitals filed for this visit.  Subjective Assessment - 08/15/18 1404    Subjective  The pain is 25% better.     Patient Stated Goals  reduce pain in left leg    Currently in Pain?  Yes    Pain Score  1     Pain Location  Hip    Pain Orientation  Left    Pain Descriptors / Indicators  Aching;Tingling    Pain Type  Acute pain    Pain Onset  More than a month ago    Pain Frequency  Intermittent    Aggravating Factors   laying in back    Pain Relieving Factors  turn left foot inward; lay on left side pain goes away  Multiple Pain Sites  No         [facility] PT Assessment - 08/15/18 0001      AROM   Lumbar Flexion  full    Lumbar Extension  decreased by 25%    Lumbar - Right Side Bend  decreased by 50%    Lumbar - Left Side Bend  decreased by 50%                   [facility] Adult PT Treatment/Exercise - 08/15/18 0001      Lumbar Exercises: Stretches   Passive Hamstring Stretch  Left;5 reps   therapist pulling left leg up and over in supine   Piriformis Stretch  Left;5 reps;10 seconds   with contract relax   Piriformis Stretch Limitations  then stretch posterior left hip in supine with contract relax    Other Lumbar Stretch Exercise  hip adductor stretch      Lumbar Exercises: Supine   Other Supine Lumbar Exercises  hold red band and pull downward to contract the abdominals 15 times      Knee/Hip Exercises: Supine   Other Supine Knee/Hip Exercises  diagonal curl up 10 each with VC breath out      Manual Therapy   Manual Therapy  Soft tissue  mobilization    Manual therapy comments  using alladay with blue head to work on the lumbar paraspinals, quadratus, and left gluteal muscles in prone, and left ITB, quads, and hamstring in sidely       Trigger Point Dry Needling - 08/15/18 1426    Consent Given?  Yes           PT Education - 08/15/18 1513    Education Details  information on massage tools to use at home    Person(s) Educated  Patient    Methods  Explanation;Handout    Comprehension  Verbalized understanding       PT Short Term Goals - 08/08/18 1443      PT SHORT TERM GOAL #1   Title  independent with initial HEP    Time  4    Period  Weeks    Status  Achieved        PT Long Term Goals - 08/15/18 1519      PT LONG TERM GOAL #1   Title  independent with HEP    Time  8    Period  Weeks    Status  On-going      PT LONG TERM GOAL #2   Title  pain in left leg  decreased >/= 50% due to increased activity     Time  8    Period  Weeks    Status  On-going      PT LONG TERM GOAL #3   Title  moving in bed with back pain decreased >/= 50% due to improved flexibility    Time  8    Period  Weeks    Status  On-going      PT LONG TERM GOAL #4   Title  lifting items in the dishwasher with greater ease due to increased in back strength    Time  8    Period  Weeks    Status  On-going            Plan - 08/15/18 1407    Clinical Impression Statement  Patient continues to work on her core stabilization exercises. Patient reports no pain or tingling after manual work. Patient  lumbar ROM has not changed. Patient reports her pain and tingling has decreased by 25%. Patient has fascial tightness in lumbar paraspinals and left thigh. Patient will benefit from skilled therapy to perform dry needling, stretching, and core stabilitzation.     Rehab Potential  Excellent    Clinical Impairments Affecting Rehab Potential  none    PT Frequency  2x / week    PT Duration  8 weeks    PT Treatment/Interventions   Cryotherapy;Electrical Stimulation;Moist Heat;Ultrasound;Therapeutic exercise;Therapeutic activities;Neuromuscular re-education;Patient/family education;Manual techniques;Passive range of motion;Dry needling    PT Next Visit Plan  lumbar joint mobilization, soft tissue work to lumbar, lumbar stabilization, update HEP    PT Home Exercise Plan  Access Code: [email or web address]     Consulted and Agree with Plan of Care  Patient       Patient will benefit from skilled therapeutic intervention in order to improve the following deficits and impairments:  Abnormal gait, Increased fascial restricitons, Pain, Decreased mobility, Increased muscle spasms, Decreased activity tolerance, Decreased range of motion, Impaired flexibility  Visit Diagnosis: Chronic midline low back pain without sciatica  Pain in left hip  Muscle weakness (generalized)     Problem List Patient Active Problem List   Diagnosis Date Noted  . Obstructive sleep apnea 09/01/2013  . Pure hypercholesterolemia 09/01/2013  . Coronary atherosclerosis of native coronary artery 09/01/2013  . Diastolic dysfunction 96/29/5284  . Essential hypertension, benign 09/01/2013  . Left Achilles tendinitis 01/23/2013    Earlie Counts, PT 08/15/18 3:20 PM   [facility] [address] [place], [place], [address] Phone: [phone]   Fax:  [phone]  Name: [name] MRN: [id number] Date of Birth: Nov 24, 1950

## 2018-08-15 NOTE — Patient Instructions (Signed)
   [facility] [address], [address] [place], [place] [address] Phone # [phone] Fax [phone]  

## 2018-08-16 ENCOUNTER — Telehealth: Payer: Self-pay | Admitting: Cardiology

## 2018-08-16 ENCOUNTER — Other Ambulatory Visit: Payer: Self-pay | Admitting: Family Medicine

## 2018-08-16 ENCOUNTER — Ambulatory Visit: Payer: Medicare Other | Admitting: Podiatry

## 2018-08-16 ENCOUNTER — Encounter: Payer: Self-pay | Admitting: Podiatry

## 2018-08-16 DIAGNOSIS — I2510 Atherosclerotic heart disease of native coronary artery without angina pectoris: Secondary | ICD-10-CM

## 2018-08-16 DIAGNOSIS — Z09 Encounter for follow-up examination after completed treatment for conditions other than malignant neoplasm: Secondary | ICD-10-CM

## 2018-08-16 DIAGNOSIS — I10 Essential (primary) hypertension: Secondary | ICD-10-CM

## 2018-08-16 DIAGNOSIS — Z1231 Encounter for screening mammogram for malignant neoplasm of breast: Secondary | ICD-10-CM

## 2018-08-16 DIAGNOSIS — E785 Hyperlipidemia, unspecified: Secondary | ICD-10-CM

## 2018-08-16 NOTE — Telephone Encounter (Signed)
Patient has yearly sleep follow up appt with Dr. Radford Pax in April, she want to know if lab work is going to be ordered for her. If so she would like to have them done before her appt.

## 2018-08-17 NOTE — Telephone Encounter (Signed)
FLP, ALT and BMET

## 2018-08-17 NOTE — Progress Notes (Signed)
This patient returns to the office to be seen for a scab which is present following nail surgery for the removal of nail plate left hallux.  She says there is pain when her socks catch on her scab.  She is having no problem with the rest of the surgery.  General Appearance  Alert, conversant and in no acute stress.  Vascular  Dorsalis pedis and posterior tibial  pulses are palpable  bilaterally.  Capillary return is within normal limits  bilaterally. Temperature is within normal limits  bilaterally.  Neurologic  Senn-Weinstein monofilament wire test within normal limits  bilaterally. Muscle power within normal limits bilaterally.  Nails Thick disfigured discolored nails with subungual debris  from hallux to fifth toes bilaterally except left hallux.  There is dark necrotic tissue noted on nail plate left hallux.  No infection noted.. No evidence of bacterial infection or drainage bilaterally.  Orthopedic  No limitations of motion  feet .  No crepitus or effusions noted.  No bony pathology or digital deformities noted.  Skin  normotropic skin with no porokeratosis noted bilaterally.  No signs of infections or ulcers noted.    S/P nail surgery.   ROV  Debride necrotic tissue.  Told her to apply vaseline and use a pumice stone until healing is complete.   Gardiner Barefoot DPM

## 2018-08-19 ENCOUNTER — Ambulatory Visit: Payer: Medicare Other | Admitting: Physical Therapy

## 2018-08-19 ENCOUNTER — Encounter: Payer: Self-pay | Admitting: Physical Therapy

## 2018-08-19 DIAGNOSIS — M25552 Pain in left hip: Secondary | ICD-10-CM

## 2018-08-19 DIAGNOSIS — M6281 Muscle weakness (generalized): Secondary | ICD-10-CM

## 2018-08-19 DIAGNOSIS — M545 Low back pain, unspecified: Secondary | ICD-10-CM

## 2018-08-19 DIAGNOSIS — G8929 Other chronic pain: Secondary | ICD-10-CM

## 2018-08-19 NOTE — Therapy (Signed)
[facility] [address], [place], [place], [address] Phone: [phone]   Fax:  [phone]  Physical Therapy Treatment  Patient Details  Name: [name] MRN: [id number] Date of Birth: Jul 17, 1951 Referring Provider (PT): Dr. Kathyrn Lass   Encounter Date: 08/19/2018  PT End of Session - 08/19/18 1409    Visit Number  7    Date for PT Re-Evaluation  09/02/18    Authorization Type  [employer] medicare    PT Start Time  1400    PT Stop Time  1440    PT Time Calculation (min)  40 min    Activity Tolerance  Patient tolerated treatment well;No increased pain    Behavior During Therapy  WFL for tasks assessed/performed       Past Medical History:  Diagnosis Date  . Arthritis   . Chronic fatigue    and weakness  . Colon polyps   . Coronary artery disease    nonobstructive with 20% OM by cath 2006  . Depression   . Diabetes mellitus without complication ([place])   . Diastolic dysfunction    w Elevated LVEDP  . DJD (degenerative joint disease)    C3/4 Dr Sherwood Gambler  . GERD (gastroesophageal reflux disease)   . H/O hiatal hernia   . High cholesterol   . Hypertension   . Hypoglycemia   . Iron deficiency anemia   . Kidney stones   . Multiple thyroid nodules   . Neurocardiogenic syncope   . Obesity   . Prediabetes 2011 & 2012  . Right knee pain 12/2009   Dr Marlou Sa  . Sleep apnea    CPAP had Sleep Study ordered by Dr. Radford Pax  . Tick bite 11/2016  . Vasovagal syncope     Past Surgical History:  Procedure Laterality Date  . ACHILLES TENDON SURGERY Left 01/23/2013   Procedure: EXCISION PARTIAL BONE TALUS/CALCANEUS, REPAIR RUPTURE ACHILLES TENDON PRIMARY OPEN ;  Surgeon: Ninetta Lights, MD;  Location: [facility];  Service: Orthopedics;  Laterality: Left;  . ANTERIOR FUSION CERVICAL SPINE     x2-cervical  . BREAST CYST EXCISION Left   . CARDIAC CATHETERIZATION  ~ 2008   Nonobstructive ASCAD, 20% OM   . CARPAL TUNNEL RELEASE Left   . LITHOTRIPSY    . OTHER SURGICAL HISTORY  06/12/2018   Left big toe nail surgery. [facility]   . TONSILLECTOMY    . TOTAL KNEE ARTHROPLASTY Right 11/2010  . TOTAL KNEE ARTHROPLASTY  12/13/2011   Procedure: TOTAL KNEE ARTHROPLASTY;  Surgeon: Ninetta Lights, MD;  Location: [facility];  Service: Orthopedics;  Laterality: Left;  DR MURPHY WANTS 90 MINUTES FOR THIS CASE    There were no vitals filed for this visit.  Subjective Assessment - 08/19/18 1405    Subjective  I feel last Saturday. The heel of my shoe got caught on the door and I feel on left knee and left side. After last visit I was good for a long time and the tinling came back but not as strong.     Patient Stated Goals  reduce pain in left leg    Currently in Pain?  Yes    Pain Score  1     Pain Location  Hip    Pain Orientation  Left    Pain Descriptors / Indicators  Aching;Tingling    Pain Type  Acute pain    Pain Onset  More than a month  ago    Pain Frequency  Intermittent    Aggravating Factors   laying in back     Pain Relieving Factors  turn left foot inward; lay on left side pain goes away    Multiple Pain Sites  No                       [facility] Adult PT Treatment/Exercise - 08/19/18 0001      Self-Care   Self-Care  Other Self-Care Comments    Other Self-Care Comments   using a tennis ball to massage at home to her muscles to reduce the tingling      Lumbar Exercises: Stretches   Passive Hamstring Stretch  Left;5 reps;3 reps   therapist pulling left leg up and over in supine   Piriformis Stretch  Left;5 reps;10 seconds   with contract relax   Piriformis Stretch Limitations  then stretch posterior left hip in supine with contract relax      Manual Therapy   Manual Therapy  Soft tissue mobilization    Manual therapy comments  using alladay with blue head and fascial head to work  left quadriceps and iliotibial band       Trigger Point Dry Needling -  08/19/18 1436    Consent Given?  Yes    Muscles Treated Lower Body  Tensor fascia lata;Quadriceps   left   Tensor Fascia Lata Response  Twitch response elicited;Palpable increased muscle length    Quadriceps Response  Twitch response elicited;Palpable increased muscle length             PT Short Term Goals - 08/08/18 1443      PT SHORT TERM GOAL #1   Title  independent with initial HEP    Time  4    Period  Weeks    Status  Achieved        PT Long Term Goals - 08/19/18 1410      PT LONG TERM GOAL #1   Title  independent with HEP    Time  8    Period  Weeks    Status  On-going      PT LONG TERM GOAL #2   Title  pain in left leg  decreased >/= 50% due to increased activity     Baseline  just fell, 1/18 and increased pain    Time  8    Period  Weeks    Status  On-going      PT LONG TERM GOAL #3   Title  moving in bed with back pain decreased >/= 50% due to improved flexibility    Time  8    Period  Weeks    Status  On-going      PT LONG TERM GOAL #4   Title  lifting items in the dishwasher with greater ease due to increased in back strength    Baseline  ---    Time  8    Period  Weeks    Status  Achieved      PT LONG TERM GOAL #5   Title  FOTO score </= 48% limitation    Time  8    Period  Weeks    Status  New            Plan - 08/19/18 1410    Clinical Impression Statement  Patient has fascial restrictions in the left quadriceps and iliotibial band. Patient fell this past Saturday and hurt her  leg. Patient reports she did well after therapy until she fell. Patient left therapy without pain. Patient is able to unload dishwasher without issued when she is bending her knees and using correct body mechanincs.  Patient will benefit from skilled therapy to perform dry needling stretcing, and core stabilization.     Rehab Potential  Excellent    Clinical Impairments Affecting Rehab Potential  none    PT Frequency  2x / week    PT Duration  8 weeks    PT  Treatment/Interventions  Cryotherapy;Electrical Stimulation;Moist Heat;Ultrasound;Therapeutic exercise;Therapeutic activities;Neuromuscular re-education;Patient/family education;Manual techniques;Passive range of motion;Dry needling    PT Next Visit Plan  soft tissue work to lumbar, lumbar stabilization, update HEP    PT Home Exercise Plan  Access Code: [email or web address]     Consulted and Agree with Plan of Care  Patient       Patient will benefit from skilled therapeutic intervention in order to improve the following deficits and impairments:  Abnormal gait, Increased fascial restricitons, Pain, Decreased mobility, Increased muscle spasms, Decreased activity tolerance, Decreased range of motion, Impaired flexibility  Visit Diagnosis: Chronic midline low back pain without sciatica  Pain in left hip  Muscle weakness (generalized)     Problem List Patient Active Problem List   Diagnosis Date Noted  . Obstructive sleep apnea 09/01/2013  . Pure hypercholesterolemia 09/01/2013  . Coronary atherosclerosis of native coronary artery 09/01/2013  . Diastolic dysfunction 56/25/6389  . Essential hypertension, benign 09/01/2013  . Left Achilles tendinitis 01/23/2013    Earlie Counts, PT 08/19/18 2:46 PM   [facility] [address] [place], [place], [address] Phone: [phone]   Fax:  [phone]  Name: [name] MRN: [id number] Date of Birth: February 27, 1951

## 2018-08-19 NOTE — Telephone Encounter (Signed)
Spoke with the patient, she scheduled her fasting labs on 4/24. She had no further questions.

## 2018-08-22 ENCOUNTER — Encounter: Payer: Self-pay | Admitting: Physical Therapy

## 2018-08-22 ENCOUNTER — Ambulatory Visit: Payer: Medicare Other | Admitting: Physical Therapy

## 2018-08-22 DIAGNOSIS — M545 Low back pain, unspecified: Secondary | ICD-10-CM

## 2018-08-22 DIAGNOSIS — M25552 Pain in left hip: Secondary | ICD-10-CM

## 2018-08-22 DIAGNOSIS — M6281 Muscle weakness (generalized): Secondary | ICD-10-CM

## 2018-08-22 DIAGNOSIS — G8929 Other chronic pain: Secondary | ICD-10-CM

## 2018-08-22 NOTE — Therapy (Signed)
[facility] [address] [name], [place], [address] Phone: [phone]   Fax:  [phone]  Physical Therapy Treatment  Patient Details  Name: [name] MRN: [id number] Date of Birth: 04/26/1951 Referring Provider (PT): Dr. Kathyrn Lass   Encounter Date: 08/22/2018  PT End of Session - 08/22/18 1408    Visit Number  8    Date for PT Re-Evaluation  09/02/18    Authorization Type  [employer] medicare    PT Start Time  6789    PT Stop Time  3810    PT Time Calculation (min)  40 min    Activity Tolerance  Patient tolerated treatment well;No increased pain    Behavior During Therapy  WFL for tasks assessed/performed       Past Medical History:  Diagnosis Date  . Arthritis   . Chronic fatigue    and weakness  . Colon polyps   . Coronary artery disease    nonobstructive with 20% OM by cath 2006  . Depression   . Diabetes mellitus without complication ([place])   . Diastolic dysfunction    w Elevated LVEDP  . DJD (degenerative joint disease)    C3/4 Dr Sherwood Gambler  . GERD (gastroesophageal reflux disease)   . H/O hiatal hernia   . High cholesterol   . Hypertension   . Hypoglycemia   . Iron deficiency anemia   . Kidney stones   . Multiple thyroid nodules   . Neurocardiogenic syncope   . Obesity   . Prediabetes 2011 & 2012  . Right knee pain 12/2009   Dr Marlou Sa  . Sleep apnea    CPAP had Sleep Study ordered by Dr. Radford Pax  . Tick bite 11/2016  . Vasovagal syncope     Past Surgical History:  Procedure Laterality Date  . ACHILLES TENDON SURGERY Left 01/23/2013   Procedure: EXCISION PARTIAL BONE TALUS/CALCANEUS, REPAIR RUPTURE ACHILLES TENDON PRIMARY OPEN ;  Surgeon: Ninetta Lights, MD;  Location: [facility];  Service: Orthopedics;  Laterality: Left;  . ANTERIOR FUSION CERVICAL SPINE     x2-cervical  . BREAST CYST EXCISION Left   . CARDIAC CATHETERIZATION  ~ 2008   Nonobstructive ASCAD, 20% OM   . CARPAL TUNNEL RELEASE Left   . LITHOTRIPSY    . OTHER SURGICAL HISTORY  06/12/2018   Left big toe nail surgery. [facility]   . TONSILLECTOMY    . TOTAL KNEE ARTHROPLASTY Right 11/2010  . TOTAL KNEE ARTHROPLASTY  12/13/2011   Procedure: TOTAL KNEE ARTHROPLASTY;  Surgeon: Ninetta Lights, MD;  Location: [facility];  Service: Orthopedics;  Laterality: Left;  DR MURPHY WANTS 90 MINUTES FOR THIS CASE    There were no vitals filed for this visit.  Subjective Assessment - 08/22/18 1407    Subjective  Since last visit I have had less pain in left thigh.     Patient Stated Goals  reduce pain in left leg    Currently in Pain?  No/denies    Multiple Pain Sites  No                       [facility] Adult PT Treatment/Exercise - 08/22/18 0001      Lumbar Exercises: Aerobic   Nustep  Seat #10, arm #11, level 3, 6 min      Knee/Hip Exercises: Standing   Step Down  1 set;Right;Left;10 reps;Hand Hold: 2;Step Height: 6"  Wall Squat  1 set;10 reps;5 seconds   wall against ball   SLS  stand on slider and push out to side, push backwards on bilateral legs 10x each    Other Standing Knee Exercises  stand with one leg ahead of other bringing hands away form the steps with yellow band for trunk strength      Manual Therapy   Manual Therapy  Soft tissue mobilization    Manual therapy comments  using the addaday to the lumbar and thoracic paraspinals       Trigger Point Dry Needling - 08/22/18 1430    Consent Given?  Yes    Muscles Treated Upper Body  --   T9-L5 multifidi bil.    Muscles Treated Lower Body  --   elongation of tissue, trigger point response            PT Short Term Goals - 08/08/18 1443      PT SHORT TERM GOAL #1   Title  independent with initial HEP    Time  4    Period  Weeks    Status  Achieved        PT Long Term Goals - 08/19/18 1410      PT LONG TERM GOAL #1   Title  independent with HEP    Time  8    Period  Weeks    Status   On-going      PT LONG TERM GOAL #2   Title  pain in left leg  decreased >/= 50% due to increased activity     Baseline  just fell, 1/18 and increased pain    Time  8    Period  Weeks    Status  On-going      PT LONG TERM GOAL #3   Title  moving in bed with back pain decreased >/= 50% due to improved flexibility    Time  8    Period  Weeks    Status  On-going      PT LONG TERM GOAL #4   Title  lifting items in the dishwasher with greater ease due to increased in back strength    Baseline  ---    Time  8    Period  Weeks    Status  Achieved      PT LONG TERM GOAL #5   Title  FOTO score </= 48% limitation    Time  8    Period  Weeks    Status  New            Plan - 08/22/18 1444    Clinical Impression Statement  Patient has had less tingling and pain in left thigh after last dry needling session. Patient had trigger points in the lower thoracic and higher lumbar area. Patient is work on core strength. Patient will benefit from skilled therapy to perform dry needling, stretching, and core stabilization.     Rehab Potential  Excellent    Clinical Impairments Affecting Rehab Potential  none    PT Frequency  2x / week    PT Duration  8 weeks    PT Treatment/Interventions  Cryotherapy;Electrical Stimulation;Moist Heat;Ultrasound;Therapeutic exercise;Therapeutic activities;Neuromuscular re-education;Patient/family education;Manual techniques;Passive range of motion;Dry needling    PT Next Visit Plan  dry needling to left thigh,  update HEP    PT Home Exercise Plan  Access Code: [email or web address]     Consulted and Agree with Plan of Care  Patient  Patient will benefit from skilled therapeutic intervention in order to improve the following deficits and impairments:  Abnormal gait, Increased fascial restricitons, Pain, Decreased mobility, Increased muscle spasms, Decreased activity tolerance, Decreased range of motion, Impaired flexibility  Visit Diagnosis: Chronic midline low back  pain without sciatica  Pain in left hip  Muscle weakness (generalized)     Problem List Patient Active Problem List   Diagnosis Date Noted  . Obstructive sleep apnea 09/01/2013  . Pure hypercholesterolemia 09/01/2013  . Coronary atherosclerosis of native coronary artery 09/01/2013  . Diastolic dysfunction 73/71/0626  . Essential hypertension, benign 09/01/2013  . Left Achilles tendinitis 01/23/2013    Earlie Counts, PT 08/22/18 2:47 PM   [facility] [address] [place], [place], [address] Phone: [phone]   Fax:  [phone]  Name: CHESNIE CAPELL MRN: [id number] Date of Birth: 1950-11-29

## 2018-08-26 ENCOUNTER — Ambulatory Visit: Payer: Medicare Other | Admitting: Physical Therapy

## 2018-08-26 ENCOUNTER — Encounter: Payer: Self-pay | Admitting: Physical Therapy

## 2018-08-26 DIAGNOSIS — M545 Low back pain, unspecified: Secondary | ICD-10-CM

## 2018-08-26 DIAGNOSIS — G8929 Other chronic pain: Secondary | ICD-10-CM

## 2018-08-26 DIAGNOSIS — M25552 Pain in left hip: Secondary | ICD-10-CM

## 2018-08-26 DIAGNOSIS — M6281 Muscle weakness (generalized): Secondary | ICD-10-CM

## 2018-08-26 DIAGNOSIS — R2689 Other abnormalities of gait and mobility: Secondary | ICD-10-CM

## 2018-08-26 NOTE — Therapy (Signed)
[facility] [address] [place], [place], [address] Phone: [phone]   Fax:  [phone]  Physical Therapy Treatment  Patient Details  Name: [name] MRN: [id number] Date of Birth: 16-Jun-1951 Referring Provider (PT): Dr. Kathyrn Lass   Encounter Date: 08/26/2018  PT End of Session - 08/26/18 1527    Visit Number  9    Date for PT Re-Evaluation  09/02/18    Authorization Type  [employer] medicare    PT Start Time  3570   Pt late   PT Stop Time  1528    PT Time Calculation (min)  33 min    Activity Tolerance  Patient tolerated treatment well;No increased pain    Behavior During Therapy  WFL for tasks assessed/performed       Past Medical History:  Diagnosis Date  . Arthritis   . Chronic fatigue    and weakness  . Colon polyps   . Coronary artery disease    nonobstructive with 20% OM by cath 2006  . Depression   . Diabetes mellitus without complication ([place])   . Diastolic dysfunction    w Elevated LVEDP  . DJD (degenerative joint disease)    C3/4 Dr Sherwood Gambler  . GERD (gastroesophageal reflux disease)   . H/O hiatal hernia   . High cholesterol   . Hypertension   . Hypoglycemia   . Iron deficiency anemia   . Kidney stones   . Multiple thyroid nodules   . Neurocardiogenic syncope   . Obesity   . Prediabetes 2011 & 2012  . Right knee pain 12/2009   Dr Marlou Sa  . Sleep apnea    CPAP had Sleep Study ordered by Dr. Radford Pax  . Tick bite 11/2016  . Vasovagal syncope     Past Surgical History:  Procedure Laterality Date  . ACHILLES TENDON SURGERY Left 01/23/2013   Procedure: EXCISION PARTIAL BONE TALUS/CALCANEUS, REPAIR RUPTURE ACHILLES TENDON PRIMARY OPEN ;  Surgeon: Ninetta Lights, MD;  Location: [facility];  Service: Orthopedics;  Laterality: Left;  . ANTERIOR FUSION CERVICAL SPINE     x2-cervical  . BREAST CYST EXCISION Left   . CARDIAC CATHETERIZATION  ~ 2008   Nonobstructive  ASCAD, 20% OM  . CARPAL TUNNEL RELEASE Left   . LITHOTRIPSY    . OTHER SURGICAL HISTORY  06/12/2018   Left big toe nail surgery. [facility]   . TONSILLECTOMY    . TOTAL KNEE ARTHROPLASTY Right 11/2010  . TOTAL KNEE ARTHROPLASTY  12/13/2011   Procedure: TOTAL KNEE ARTHROPLASTY;  Surgeon: Ninetta Lights, MD;  Location: [facility];  Service: Orthopedics;  Laterality: Left;  DR MURPHY WANTS 90 MINUTES FOR THIS CASE    There were no vitals filed for this visit.  Subjective Assessment - 08/26/18 1455    Subjective  Low back pain is better.  I feel like I have worms or bugs crawling along my thigh, especially noticable at night. Not so much itching but "crawling."  (Pended)     Patient Stated Goals  reduce pain in left leg  (Pended)     Currently in Pain?  Yes  (Pended)     Pain Score  1   (Pended)     Pain Location  Leg  (Pended)     Pain Orientation  Left;Anterior;Lateral  (Pended)     Pain Descriptors / Indicators  --  (Pended)    crawling, pain   Pain Type  Acute pain  (Pended)     Pain Onset  More than a month ago  (Pended)     Pain Frequency  Intermittent  (Pended)                        [facility] Adult PT Treatment/Exercise - 08/26/18 0001      Lumbar Exercises: Standing   Other Standing Lumbar Exercises  sidestepping, forward, backward reactive trunk isometrics yellow band 3x10      Manual Therapy   Manual Therapy  Soft tissue mobilization    Manual therapy comments  ITB distally, quads       Trigger Point Dry Needling - 08/26/18 1520    Consent Given?  Yes    Muscles Treated Lower Body  Tensor fascia lata;Quadriceps   Left   Gluteus Maximus Response  --   Left   Tensor Fascia Lata Response  Twitch response elicited;Palpable increased muscle length    Quadriceps Response  Twitch response elicited;Palpable increased muscle length             PT Short Term Goals - 08/08/18 1443      PT SHORT TERM GOAL #1   Title  independent with initial HEP     Time  4    Period  Weeks    Status  Achieved        PT Long Term Goals - 08/19/18 1410      PT LONG TERM GOAL #1   Title  independent with HEP    Time  8    Period  Weeks    Status  On-going      PT LONG TERM GOAL #2   Title  pain in left leg  decreased >/= 50% due to increased activity     Baseline  just fell, 1/18 and increased pain    Time  8    Period  Weeks    Status  On-going      PT LONG TERM GOAL #3   Title  moving in bed with back pain decreased >/= 50% due to improved flexibility    Time  8    Period  Weeks    Status  On-going      PT LONG TERM GOAL #4   Title  lifting items in the dishwasher with greater ease due to increased in back strength    Baseline  ---    Time  8    Period  Weeks    Status  Achieved      PT LONG TERM GOAL #5   Title  FOTO score </= 48% limitation    Time  8    Period  Weeks    Status  New            Plan - 08/26/18 1528    Clinical Impression Statement  Pt with report of "bugs crawling" along Lt lateral and anterior thigh especially at night.  PT noted signif tenderness along Lt lateral quad and ITB.  PT performed dry needling along these muscles with improved pain.  Pt with improving core awareness in standing with reactive isometrics. Pt will continue to benefit from skilled PT along POC for dry needling, core stab and stretching.    History and Personal Factors relevant to plan of care:       Rehab Potential  Excellent    Clinical Impairments Affecting Rehab Potential  none    PT Frequency  2x / week  PT Treatment/Interventions  Cryotherapy;Electrical Stimulation;Moist Heat;Ultrasound;Therapeutic exercise;Therapeutic activities;Neuromuscular re-education;Patient/family education;Manual techniques;Passive range of motion;Dry needling    PT Next Visit Plan  f/u on dry needling to Lt thigh, progress core stabilization     PT Home Exercise Plan  Access Code: [email or web address]     Consulted and Agree with Plan of Care  Patient        Patient will benefit from skilled therapeutic intervention in order to improve the following deficits and impairments:  Abnormal gait, Increased fascial restricitons, Pain, Decreased mobility, Increased muscle spasms, Decreased activity tolerance, Decreased range of motion, Impaired flexibility  Visit Diagnosis: Chronic midline low back pain without sciatica  Pain in left hip  Muscle weakness (generalized)  Chronic bilateral low back pain without sciatica  Other abnormalities of gait and mobility     Problem List Patient Active Problem List   Diagnosis Date Noted  . Obstructive sleep apnea 09/01/2013  . Pure hypercholesterolemia 09/01/2013  . Coronary atherosclerosis of native coronary artery 09/01/2013  . Diastolic dysfunction 90/30/0923  . Essential hypertension, benign 09/01/2013  . Left Achilles tendinitis 01/23/2013   Baruch Merl, PT 08/26/18 3:32 PM   [facility] [address] [place], [place], [address] Phone: [phone]   Fax:  [phone]  Name: [name] MRN: [id number] Date of Birth: 09/21/1950

## 2018-08-27 ENCOUNTER — Other Ambulatory Visit: Payer: Medicare Other | Admitting: Orthotics

## 2018-08-29 ENCOUNTER — Ambulatory Visit: Payer: Medicare Other | Admitting: Physical Therapy

## 2018-08-29 ENCOUNTER — Encounter: Payer: Self-pay | Admitting: Physical Therapy

## 2018-08-29 DIAGNOSIS — M545 Low back pain, unspecified: Secondary | ICD-10-CM

## 2018-08-29 DIAGNOSIS — M6281 Muscle weakness (generalized): Secondary | ICD-10-CM

## 2018-08-29 DIAGNOSIS — G8929 Other chronic pain: Secondary | ICD-10-CM

## 2018-08-29 DIAGNOSIS — M25552 Pain in left hip: Secondary | ICD-10-CM

## 2018-08-29 NOTE — Therapy (Signed)
[facility] [address] [place], [place], [address] Phone: [phone]   Fax:  [phone]  Physical Therapy Treatment  Patient Details  Name: [name] MRN: [id number] Date of Birth: 31-Oct-1950 Referring Provider (PT): Dr. Kathyrn Lass  Progress Note Reporting Period 07/08/2018 to 08/29/2018  See note below for Objective Data and Assessment of Progress/Goals.      Encounter Date: 08/29/2018  PT End of Session - 08/29/18 1529    Visit Number  10    Date for PT Re-Evaluation  09/02/18    Authorization Type  [employer] medicare    PT Start Time  4403    PT Stop Time  1528    PT Time Calculation (min)  43 min    Activity Tolerance  Patient tolerated treatment well;No increased pain    Behavior During Therapy  WFL for tasks assessed/performed       Past Medical History:  Diagnosis Date  . Arthritis   . Chronic fatigue    and weakness  . Colon polyps   . Coronary artery disease    nonobstructive with 20% OM by cath 2006  . Depression   . Diabetes mellitus without complication ([place])   . Diastolic dysfunction    w Elevated LVEDP  . DJD (degenerative joint disease)    C3/4 Dr Sherwood Gambler  . GERD (gastroesophageal reflux disease)   . H/O hiatal hernia   . High cholesterol   . Hypertension   . Hypoglycemia   . Iron deficiency anemia   . Kidney stones   . Multiple thyroid nodules   . Neurocardiogenic syncope   . Obesity   . Prediabetes 2011 & 2012  . Right knee pain 12/2009   Dr Marlou Sa  . Sleep apnea    CPAP had Sleep Study ordered by Dr. Radford Pax  . Tick bite 11/2016  . Vasovagal syncope     Past Surgical History:  Procedure Laterality Date  . ACHILLES TENDON SURGERY Left 01/23/2013   Procedure: EXCISION PARTIAL BONE TALUS/CALCANEUS, REPAIR RUPTURE ACHILLES TENDON PRIMARY OPEN ;  Surgeon: Ninetta Lights, MD;  Location: [place];  Service: Orthopedics;  Laterality: Left;  . ANTERIOR  FUSION CERVICAL SPINE     x2-cervical  . BREAST CYST EXCISION Left   . CARDIAC CATHETERIZATION  ~ 2008   Nonobstructive ASCAD, 20% OM  . CARPAL TUNNEL RELEASE Left   . LITHOTRIPSY    . OTHER SURGICAL HISTORY  06/12/2018   Left big toe nail surgery. [facility]   . TONSILLECTOMY    . TOTAL KNEE ARTHROPLASTY Right 11/2010  . TOTAL KNEE ARTHROPLASTY  12/13/2011   Procedure: TOTAL KNEE ARTHROPLASTY;  Surgeon: Ninetta Lights, MD;  Location: [place];  Service: Orthopedics;  Laterality: Left;  DR MURPHY WANTS 90 MINUTES FOR THIS CASE    There were no vitals filed for this visit.  Subjective Assessment - 08/29/18 1452    Subjective  I feel better. The itching tinling has gone down.     Patient Stated Goals  reduce pain in left leg    Currently in Pain?  No/denies                       [facility] Adult PT Treatment/Exercise - 08/29/18 0001      Lumbar Exercises: Standing   Other Standing Lumbar Exercises  sidestepping, forward, backward reactive trunk isometrics yellow band 3x10    Other Standing Lumbar Exercises  stand and move red band side to side to work trunk; mini squat bil shoulder extension      Lumbar Exercises: Supine   Bent Knee Raise  15 reps    Bridge with clamshell  10 reps    Other Supine Lumbar Exercises  hookly alternate shoulder flexion 2 # wt. with abdominal contraction,       Manual Therapy   Manual Therapy  Soft tissue mobilization    Manual therapy comments  using the addaday to the lumbar and thoracic paraspinals, left ilitibial , left gluteal, left hamstring       Trigger Point Dry Needling - 08/29/18 1525    Muscles Treated Upper Body  --   T10-T10 multifidi bil.    Muscles Treated Lower Body  Gluteus minimus   left   Gluteus Minimus Response  Twitch response elicited;Palpable increased muscle length             PT Short Term Goals - 08/08/18 1443      PT SHORT TERM GOAL #1   Title  independent with initial HEP    Time  4     Period  Weeks    Status  Achieved        PT Long Term Goals - 08/29/18 1550      PT LONG TERM GOAL #1   Title  independent with HEP    Time  8    Period  Weeks    Status  On-going      PT LONG TERM GOAL #2   Title  pain in left leg  decreased >/= 50% due to increased activity     Time  8    Period  Weeks    Status  Achieved      PT LONG TERM GOAL #3   Title  moving in bed with back pain decreased >/= 50% due to improved flexibility    Time  8    Period  Weeks    Status  On-going      PT LONG TERM GOAL #4   Title  lifting items in the dishwasher with greater ease due to increased in back strength    Time  8    Period  Weeks    Status  Achieved      PT LONG TERM GOAL #5   Title  FOTO score </= 48% limitation    Baseline  45% limited    Time  8    Period  Weeks    Status  New            Plan - 08/29/18 1453    Clinical Impression Statement  Patient has less fascial restrictions in the lumbar, left hip and left thigh. Patient is having minimal feeling of bugs crawling on the left lateral and anterior thigh especially at night. Pateint has increased strength of her hips and improve muscle mobility of her back and left thigh. Patient will beneift from skilled therapy to work on strength and tissue mobility.     PT Frequency  2x / week    PT Duration  8 weeks    PT Treatment/Interventions  Cryotherapy;Electrical Stimulation;Moist Heat;Ultrasound;Therapeutic exercise;Therapeutic activities;Neuromuscular re-education;Patient/family education;Manual techniques;Passive range of motion;Dry needling    PT Next Visit Plan  f/u on dry needling to Lt thigh, progress core stabilization     PT Home Exercise Plan  Access Code: [email or web address]     Consulted and Agree with Plan of Care  Patient  Patient will benefit from skilled therapeutic intervention in order to improve the following deficits and impairments:  Abnormal gait, Increased fascial restricitons, Pain, Decreased  mobility, Increased muscle spasms, Decreased activity tolerance, Decreased range of motion, Impaired flexibility  Visit Diagnosis: Chronic midline low back pain without sciatica  Pain in left hip  Muscle weakness (generalized)     Problem List Patient Active Problem List   Diagnosis Date Noted  . Obstructive sleep apnea 09/01/2013  . Pure hypercholesterolemia 09/01/2013  . Coronary atherosclerosis of native coronary artery 09/01/2013  . Diastolic dysfunction 21/94/7125  . Essential hypertension, benign 09/01/2013  . Left Achilles tendinitis 01/23/2013    Earlie Counts, PT 08/29/18 5:13 PM   [facility] [address], [place], [place], [address] Phone: [phone]   Fax:  [phone]  Name: [name] MRN: [id number] Date of Birth: Jan 01, 1951

## 2018-09-02 ENCOUNTER — Encounter: Payer: Self-pay | Admitting: Physical Therapy

## 2018-09-02 ENCOUNTER — Ambulatory Visit: Payer: Medicare Other | Attending: Family Medicine | Admitting: Physical Therapy

## 2018-09-02 DIAGNOSIS — M6281 Muscle weakness (generalized): Secondary | ICD-10-CM | POA: Diagnosis present

## 2018-09-02 DIAGNOSIS — G8929 Other chronic pain: Secondary | ICD-10-CM | POA: Diagnosis present

## 2018-09-02 DIAGNOSIS — M545 Low back pain, unspecified: Secondary | ICD-10-CM

## 2018-09-02 DIAGNOSIS — M25552 Pain in left hip: Secondary | ICD-10-CM | POA: Insufficient documentation

## 2018-09-02 NOTE — Therapy (Signed)
[facility] [address], [place], [place], [address] Phone: [phone]   Fax:  [phone]  Physical Therapy Treatment  Patient Details  Name: [name] MRN: [id number] Date of Birth: 1951/02/25 Referring Provider (PT): Dr. Kathyrn Lass   Encounter Date: 09/02/2018  PT End of Session - 09/02/18 1529    Visit Number  11    Date for PT Re-Evaluation  09/02/18    Authorization Type  [employer] medicare    PT Start Time  6237    PT Stop Time  1528    PT Time Calculation (min)  43 min    Activity Tolerance  Patient tolerated treatment well;No increased pain    Behavior During Therapy  WFL for tasks assessed/performed       Past Medical History:  Diagnosis Date  . Arthritis   . Chronic fatigue    and weakness  . Colon polyps   . Coronary artery disease    nonobstructive with 20% OM by cath 2006  . Depression   . Diabetes mellitus without complication ([place])   . Diastolic dysfunction    w Elevated LVEDP  . DJD (degenerative joint disease)    C3/4 Dr Sherwood Gambler  . GERD (gastroesophageal reflux disease)   . H/O hiatal hernia   . High cholesterol   . Hypertension   . Hypoglycemia   . Iron deficiency anemia   . Kidney stones   . Multiple thyroid nodules   . Neurocardiogenic syncope   . Obesity   . Prediabetes 2011 & 2012  . Right knee pain 12/2009   Dr Marlou Sa  . Sleep apnea    CPAP had Sleep Study ordered by Dr. Radford Pax  . Tick bite 11/2016  . Vasovagal syncope     Past Surgical History:  Procedure Laterality Date  . ACHILLES TENDON SURGERY Left 01/23/2013   Procedure: EXCISION PARTIAL BONE TALUS/CALCANEUS, REPAIR RUPTURE ACHILLES TENDON PRIMARY OPEN ;  Surgeon: Ninetta Lights, MD;  Location: [facility];  Service: Orthopedics;  Laterality: Left;  . ANTERIOR FUSION CERVICAL SPINE     x2-cervical  . BREAST CYST EXCISION Left   . CARDIAC CATHETERIZATION  ~ 2008   Nonobstructive ASCAD, 20% OM   . CARPAL TUNNEL RELEASE Left   . LITHOTRIPSY    . OTHER SURGICAL HISTORY  06/12/2018   Left big toe nail surgery. [facility]   . TONSILLECTOMY    . TOTAL KNEE ARTHROPLASTY Right 11/2010  . TOTAL KNEE ARTHROPLASTY  12/13/2011   Procedure: TOTAL KNEE ARTHROPLASTY;  Surgeon: Ninetta Lights, MD;  Location: [facility];  Service: Orthopedics;  Laterality: Left;  DR MURPHY WANTS 90 MINUTES FOR THIS CASE    There were no vitals filed for this visit.  Subjective Assessment - 09/02/18 1451    Subjective  I always have some tingling in the lateral left thigh. The tingling in left thigh is 95% better. My right hip has been bothering me.     Patient Stated Goals  reduce pain in left leg    Currently in Pain?  No/denies         [facility] PT Assessment - 09/02/18 0001      Assessment   Medical Diagnosis  M79.605 Left leg pain    Referring Provider (PT)  Dr. Kathyrn Lass    Onset Date/Surgical Date  04/08/18    Prior Therapy  None      Precautions   Precautions  None  Restrictions   Weight Bearing Restrictions  No      Home Film/video editor residence      Prior Function   Level of Independence  Independent      Cognition   Overall Cognitive Status  Within Functional Limits for tasks assessed      Observation/Other Assessments   Focus on Therapeutic Outcomes (FOTO)    42% limitation      AROM   Lumbar Flexion  full    Lumbar Extension  full    Lumbar - Right Side Bend  full    Lumbar - Left Side Bend  full      Strength   Right Hip Extension  5/5    Right Hip ABduction  4+/5    Left Hip Flexion  5/5    Left Hip Extension  5/5    Left Hip ABduction  4+/5                   [facility] Adult PT Treatment/Exercise - 09/02/18 0001      Self-Care   Self-Care  Other Self-Care Comments    Other Self-Care Comments   instruction on what spinal stenosis is and how it affects the thigh tingling      Lumbar Exercises: Aerobic   Nustep  Seat  #10, level 3, 7 min   while assessing patient     Manual Therapy   Manual Therapy  Soft tissue mobilization    Manual therapy comments  using the addaday to the  left ilitibial ,, left hamstring       Trigger Point Dry Needling - 09/02/18 1514    Consent Given?  Yes    Muscles Treated Lower Body  Quadriceps;Tensor fascia lata    Tensor Fascia Lata Response  Twitch response elicited;Palpable increased muscle length    Quadriceps Response  Twitch response elicited;Palpable increased muscle length           PT Education - 09/02/18 1528    Education Details  information on muscle massager    Person(s) Educated  Patient    Methods  Explanation;Handout    Comprehension  Verbalized understanding       PT Short Term Goals - 08/08/18 1443      PT SHORT TERM GOAL #1   Title  independent with initial HEP    Time  4    Period  Weeks    Status  Achieved        PT Long Term Goals - 09/02/18 1531      PT LONG TERM GOAL #1   Title  independent with HEP    Time  8    Period  Weeks    Status  Achieved      PT LONG TERM GOAL #2   Title  pain in left leg  decreased >/= 50% due to increased activity     Time  8    Period  Weeks    Status  Achieved      PT LONG TERM GOAL #3   Title  moving in bed with back pain decreased >/= 50% due to improved flexibility    Time  8    Period  Weeks    Status  Achieved      PT LONG TERM GOAL #4   Title  lifting items in the dishwasher with greater ease due to increased in back strength    Time  8    Period  Weeks    Status  Achieved      PT LONG TERM GOAL #5   Title  FOTO score </= 48% limitation    Time  8    Period  Weeks    Status  Achieved            Plan - 09/02/18 1530    Clinical Impression Statement  Patient has full lumbar ROM. Patient has increased strength of bilateral hips. Patient has less trigger points in the left ITB and lateral quadriceps. Patient reports the numbness in the left lateral thigh is 90% better.  Patient is not having the itching feeling in the left lateral thigh. Patient is independent with her HEP. Patient is ready for discharge.     Rehab Potential  Excellent    Clinical Impairments Affecting Rehab Potential  none    PT Treatment/Interventions  Cryotherapy;Electrical Stimulation;Moist Heat;Ultrasound;Therapeutic exercise;Therapeutic activities;Neuromuscular re-education;Patient/family education;Manual techniques;Passive range of motion;Dry needling    PT Next Visit Plan  Discharge to HEP this visit    PT Home Exercise Plan  Access Code: [email or web address]     Consulted and Agree with Plan of Care  Patient       Patient will benefit from skilled therapeutic intervention in order to improve the following deficits and impairments:  Abnormal gait, Increased fascial restricitons, Pain, Decreased mobility, Increased muscle spasms, Decreased activity tolerance, Decreased range of motion, Impaired flexibility  Visit Diagnosis: Chronic midline low back pain without sciatica  Pain in left hip  Muscle weakness (generalized)  Chronic bilateral low back pain without sciatica     Problem List Patient Active Problem List   Diagnosis Date Noted  . Obstructive sleep apnea 09/01/2013  . Pure hypercholesterolemia 09/01/2013  . Coronary atherosclerosis of native coronary artery 09/01/2013  . Diastolic dysfunction 91/63/8466  . Essential hypertension, benign 09/01/2013  . Left Achilles tendinitis 01/23/2013    Earlie Counts, PT 09/02/18 3:33 PM   [facility] [address], [place], [place], [address] Phone: [phone]   Fax:  [phone]  Name: [name] MRN: [id number] Date of Birth: Apr 03, 1951  PHYSICAL THERAPY DISCHARGE SUMMARY  Visits from Start of Care: 11  Current functional level related to goals / functional outcomes: See above.    Remaining deficits: See above.    Education /  Equipment: HEP Plan: Patient agrees to discharge.  Patient goals were met. Patient is being discharged due to meeting the stated rehab goals.  Thank you for the referral. Earlie Counts, PT 09/02/18 3:33 PM  ?????

## 2018-09-02 NOTE — Patient Instructions (Addendum)
   [facility] [address], [address] [place], [place] [address] Phone # [phone] Fax [phone]  

## 2018-09-03 ENCOUNTER — Other Ambulatory Visit: Payer: Medicare Other | Admitting: Orthotics

## 2018-09-10 ENCOUNTER — Ambulatory Visit: Payer: Medicare Other | Admitting: Orthotics

## 2018-09-10 DIAGNOSIS — L600 Ingrowing nail: Secondary | ICD-10-CM

## 2018-09-10 DIAGNOSIS — M722 Plantar fascial fibromatosis: Secondary | ICD-10-CM

## 2018-09-10 DIAGNOSIS — M79675 Pain in left toe(s): Secondary | ICD-10-CM

## 2018-09-10 DIAGNOSIS — Z09 Encounter for follow-up examination after completed treatment for conditions other than malignant neoplasm: Secondary | ICD-10-CM

## 2018-09-10 DIAGNOSIS — B351 Tinea unguium: Secondary | ICD-10-CM

## 2018-09-10 NOTE — Progress Notes (Signed)
Patient came into today to be cast for [employer]. Upon recommendation of Dr. Prudence Davidson Patient presents with hx of plantar fas, been a patient of dr. Prudence Davidson for several years Goals are RF stability, long arch support  Plan vendor [employer]

## 2018-09-22 ENCOUNTER — Other Ambulatory Visit: Payer: Self-pay | Admitting: Cardiology

## 2018-09-23 ENCOUNTER — Ambulatory Visit: Payer: Medicare Other

## 2018-10-10 ENCOUNTER — Other Ambulatory Visit: Payer: Medicare Other | Admitting: Orthotics

## 2018-10-11 ENCOUNTER — Ambulatory Visit
Admission: RE | Admit: 2018-10-11 | Discharge: 2018-10-11 | Disposition: A | Discharge status: Home / Self Care | Payer: Medicare Other | Source: Ambulatory Visit | Attending: Family Medicine | Admitting: Family Medicine

## 2018-10-11 ENCOUNTER — Other Ambulatory Visit: Payer: Self-pay

## 2018-10-11 DIAGNOSIS — Z1231 Encounter for screening mammogram for malignant neoplasm of breast: Secondary | ICD-10-CM

## 2018-10-24 ENCOUNTER — Other Ambulatory Visit: Payer: Medicare Other | Admitting: Orthotics

## 2018-10-31 ENCOUNTER — Other Ambulatory Visit: Payer: Self-pay | Admitting: Cardiology

## 2018-11-22 ENCOUNTER — Telehealth: Payer: Self-pay

## 2018-11-22 ENCOUNTER — Other Ambulatory Visit: Payer: Self-pay

## 2018-11-22 ENCOUNTER — Other Ambulatory Visit: Payer: Medicare Other | Admitting: *Deleted

## 2018-11-22 DIAGNOSIS — I2510 Atherosclerotic heart disease of native coronary artery without angina pectoris: Secondary | ICD-10-CM

## 2018-11-22 DIAGNOSIS — I10 Essential (primary) hypertension: Secondary | ICD-10-CM

## 2018-11-22 DIAGNOSIS — E785 Hyperlipidemia, unspecified: Secondary | ICD-10-CM

## 2018-11-22 DIAGNOSIS — E7800 Pure hypercholesterolemia, unspecified: Secondary | ICD-10-CM

## 2018-11-22 NOTE — Telephone Encounter (Signed)
Lab ordered needed to be reordered per [facility].

## 2018-11-23 LAB — BASIC METABOLIC PANEL
BUN/Creatinine Ratio: 21 (ref 12–28)
BUN: 16 mg/dL (ref 8–27)
CO2: 23 mmol/L (ref 20–29)
Calcium: 9.5 mg/dL (ref 8.7–10.3)
Chloride: 104 mmol/L (ref 96–106)
Creatinine, Ser: 0.75 mg/dL (ref 0.57–1.00)
GFR calc Af Amer: 95 mL/min/{1.73_m2} (ref >59)
GFR calc non Af Amer: 82 mL/min/{1.73_m2} (ref >59)
Glucose: 111 mg/dL — ABNORMAL HIGH (ref 65–99)
Potassium: 4.2 mmol/L (ref 3.5–5.2)
Sodium: 145 mmol/L — ABNORMAL HIGH (ref 134–144)

## 2018-11-23 LAB — ALT: ALT: 19 IU/L (ref 0–32)

## 2018-11-23 LAB — LIPID PANEL
Chol/HDL Ratio: 3.1 ratio (ref 0.0–4.4)
Cholesterol, Total: 153 mg/dL (ref 100–199)
HDL: 50 mg/dL (ref >39)
LDL Calculated: 72 mg/dL (ref 0–99)
Triglycerides: 154 mg/dL — ABNORMAL HIGH (ref 0–149)
VLDL Cholesterol Cal: 31 mg/dL (ref 5–40)

## 2018-11-25 ENCOUNTER — Telehealth: Payer: Self-pay | Admitting: Cardiology

## 2018-11-25 DIAGNOSIS — E669 Obesity, unspecified: Secondary | ICD-10-CM | POA: Insufficient documentation

## 2018-11-25 NOTE — Progress Notes (Signed)
Virtual Visit via Video Note   This visit type was conducted due to national recommendations for restrictions regarding the COVID-19 Pandemic (e.g. social distancing) in an effort to limit this patient's exposure and mitigate transmission in our community.  Due to her co-morbid illnesses, this patient is at least at moderate risk for complications without adequate follow up.  This format is felt to be most appropriate for this patient at this time.  All issues noted in this document were discussed and addressed.  A limited physical exam was performed with this format.  Please refer to the patient's chart for her consent to telehealth for [facility].  Evaluation Performed:  Follow-up visit  This visit type was conducted due to national recommendations for restrictions regarding the COVID-19 Pandemic (e.g. social distancing).  This format is felt to be most appropriate for this patient at this time.  All issues noted in this document were discussed and addressed.  No physical exam was performed (except for noted visual exam findings with Video Visits).  Please refer to the patient's chart (MyChart message for video visits and phone note for telephone visits) for the patient's consent to telehealth for [facility].  Date:  11/26/2018   ID:  [name], DOB [birth year], MRN [id number]  Patient Location:  Home  Provider location:   [place]  PCP:  Kathyrn Lass, MD  Cardiologist:  Fransico Him MD Electrophysiologist:  None   Chief Complaint:  OSA and HTN  History of Present Illness:    [name] is a 68 y.o. female who presents via audio/video conferencing for a telehealth visit today.    [name] is a 68 y.o. female with a hx of nonobstructive ASCAD with 20% OM, HTN, dyslipidemia, and OSA on CPAP.  She is here today for followup and is doing well.  She denies any  PND, orthopnea, LE edema, dizziness, palpitations or syncope. She has had some problems with  achiness under her left breast that she thinks is related to her fibrocystic breast disease.  It is nonexertional and never occurs when she is walking and there is no radiation of the discomfort and no associated N/diapohresis or SOB.  She has noticed increased DOE when walking up hills only and as soon as the hill levels off she is fine.  She has gained over 20lbs in the past year.  She is compliant with her meds and is tolerating meds with no SE.    She is doing well with her CPAP device and thinks that she has gotten used to it.  She tolerates the mask and feels the pressure is adequate.  Since going on CPAP she feels rested in the am and has no significant daytime sleepiness.  She denies any significant mouth or nasal dryness or nasal congestion.  She does not think that he snores.    The patient does not have symptoms concerning for COVID-19 infection (fever, chills, cough, or new shortness of breath).   Prior CV studies:   The following studies were reviewed today:  PAP compliance download  Past Medical History:  Diagnosis Date  . Arthritis   . Chronic fatigue    and weakness  . Colon polyps   . Coronary artery disease    nonobstructive with 20% OM by cath 2006  . Depression   . Diabetes mellitus without complication ([place])   . Diastolic dysfunction    w Elevated LVEDP  . DJD (degenerative joint disease)    C3/4  Dr Sherwood Gambler  . GERD (gastroesophageal reflux disease)   . H/O hiatal hernia   . High cholesterol   . Hypertension   . Hypoglycemia   . Iron deficiency anemia   . Kidney stones   . Multiple thyroid nodules   . Neurocardiogenic syncope   . Obesity   . Prediabetes 2011 & 2012  . Right knee pain 12/2009   Dr Marlou Sa  . Sleep apnea    CPAP had Sleep Study ordered by Dr. Radford Pax  . Tick bite 11/2016  . Vasovagal syncope    Past Surgical History:  Procedure Laterality Date  . ACHILLES TENDON SURGERY Left 01/23/2013   Procedure: EXCISION PARTIAL BONE TALUS/CALCANEUS,  REPAIR RUPTURE ACHILLES TENDON PRIMARY OPEN ;  Surgeon: Ninetta Lights, MD;  Location: [facility];  Service: Orthopedics;  Laterality: Left;  . ANTERIOR FUSION CERVICAL SPINE     x2-cervical  . BREAST CYST EXCISION Left   . CARDIAC CATHETERIZATION  ~ 2008   Nonobstructive ASCAD, 20% OM  . CARPAL TUNNEL RELEASE Left   . LITHOTRIPSY    . OTHER SURGICAL HISTORY  06/12/2018   Left big toe nail surgery. [facility]   . TONSILLECTOMY    . TOTAL KNEE ARTHROPLASTY Right 11/2010  . TOTAL KNEE ARTHROPLASTY  12/13/2011   Procedure: TOTAL KNEE ARTHROPLASTY;  Surgeon: Ninetta Lights, MD;  Location: [facility];  Service: Orthopedics;  Laterality: Left;  DR MURPHY WANTS 90 MINUTES FOR THIS CASE     Current Meds  Medication Sig  . aspirin 81 MG tablet Take 81 mg by mouth daily.  Marland Kitchen atorvastatin (LIPITOR) 40 MG tablet Take 40 mg by mouth daily.  Marland Kitchen CALCIUM PO Take 800 mg by mouth daily.  . Cholecalciferol (VITAMIN D3 PO) Take 2,000 Units by mouth daily.  . clonazePAM (KLONOPIN) 0.5 MG tablet TAKE 1 TABLET BY MOUTH AT BEDTIME EVERY DAY  . desvenlafaxine (PRISTIQ) 50 MG 24 hr tablet Take 50 mg by mouth daily.  Marland Kitchen esomeprazole (NEXIUM) 40 MG capsule Take 1 capsule (40 mg total) by mouth 2 (two) times daily before a meal.  . ezetimibe (ZETIA) 10 MG tablet TAKE 1 TABLET BY MOUTH EVERY DAY  . ibuprofen (ADVIL,MOTRIN) 200 MG tablet Take 400 mg by mouth as needed (pain).   Marland Kitchen lisinopril (PRINIVIL,ZESTRIL) 5 MG tablet TAKE 1 TABLET BY MOUTH EVERY DAY  . meloxicam (MOBIC) 15 MG tablet Take 15 mg by mouth daily.  . metFORMIN (GLUCOPHAGE) 500 MG tablet Take 500 mg by mouth 2 (two) times daily with a meal.   . Omega-3 Fatty Acids (FISH OIL) 1200 MG CAPS Take 3 capsules by mouth daily.  . traZODone (DESYREL) 50 MG tablet Take 100 mg by mouth at bedtime.    Current Facility-Administered Medications for the 11/26/18 encounter (Telemedicine) with [name], MD  Medication  . 0.9 %  sodium  chloride infusion     Allergies:   Guaifenesin & derivatives; Morphine and related; Penicillins; Tramadol; Hydrocodone-acetaminophen; and Oxycodone hcl   Social History   Tobacco Use  . Smoking status: Never Smoker  . Smokeless tobacco: Never Used  Substance Use Topics  . Alcohol use: Yes    Alcohol/week: 6.0 standard drinks    Types: 6 Glasses of wine per week    Comment: couple times per week.  . Drug use: No     Family Hx: The patient's family history includes Breast cancer in her sister; CAD in her father; CVA in her  mother; Cancer in her paternal aunt; Cancer - Prostate in her father; Colon cancer in an other family member; Crohn's disease in her paternal aunt; Diverticulosis in her father, mother, and sister; Heart disease in her father and paternal grandfather; Hypertension in her sister; Kidney disease in her mother.  ROS:   Please see the history of present illness.     All other systems reviewed and are negative.   Labs/Other Tests and Data Reviewed:    Recent Labs: 11/22/2018: ALT 19; BUN 16; Creatinine, Ser 0.75; Potassium 4.2; Sodium 145   Recent Lipid Panel Lab Results  Component Value Date/Time   CHOL 153 11/22/2018 01:21 PM   TRIG 154 (H) 11/22/2018 01:21 PM   HDL 50 11/22/2018 01:21 PM   CHOLHDL 3.1 11/22/2018 01:21 PM   CHOLHDL 3.9 08/05/2015 11:49 AM   LDLCALC 72 11/22/2018 01:21 PM   LDLDIRECT 93.3 09/17/2013 01:07 PM    Wt Readings from Last 3 Encounters:  11/26/18 226 lb 6.4 oz (102.7 kg)  06/14/18 226 lb 4 oz (102.6 kg)  01/14/18 207 lb 3.2 oz (94 kg)     Objective:    Vital Signs:  Ht 5' 6.5" (1.689 m)   Wt 226 lb 6.4 oz (102.7 kg)   BMI 35.99 kg/m    CONSTITUTIONAL:  Well nourished, well developed female in no acute distress.  EYES: anicteric MOUTH: oral mucosa is pink RESPIRATORY: Normal respiratory effort, symmetric expansion CARDIOVASCULAR: No peripheral edema SKIN: No rash, lesions or ulcers MUSCULOSKELETAL: no digital  cyanosis NEURO: Cranial Nerves II-XII grossly intact, moves all extremities PSYCH: Intact judgement and insight.  A&O x 3, Mood/affect appropriate   ASSESSMENT & PLAN:    1.  OSA - the patient is tolerating PAP therapy well without any problems. The PAP download was reviewed today and showed an AHI of 1/hr on 11 cm H2O with 97% compliance in using more than 4 hours nightly.  The patient has been using and benefiting from PAP use and will continue to benefit from therapy.   2.  Hypertension -  She will continue on Lisinopril 5mg  daily.  Her creatinine was stable at 0.75 on 11/22/2018.    3.  Obesity -  I have encouraged her to get into a routine exercise program and cut back on carbs and portions.   4.  Nonobstructive ASCAD - cath in 2006 showed 20% OM.  She has had some DOE when walking up an incline but once it levels out her SOB resolves.  She has gained 20lbs in the past 10 months.  She has had some pain under her left breast but thinks it is related to her fibrocystic breast disease.  It is nonexertional and never occurs when she is out walking and there is no associated diaphoresis, nausea or SOB. I suspect this is related to gas or her cystic breast disease.  I will get a Lexiscan myoview in 8 weeks to rule out ischemia since I think she low risk.   She will continue on ASA 81mg  daily and statin.   5.   Hyperlipidemia - her LDL goal is < 70.  Her last LDL was 72 earlier this month.  She will continue on Zetia 10mg  daily and atorvastatin 40mg  daily.    5.  COVID-19 Education:The signs and symptoms of COVID-19 were discussed with the patient and how to seek care for testing (follow up with PCP or arrange E-visit).  The importance of social distancing was discussed today.  Patient Risk:  After full review of this patient's clinical status, I feel that they are at least moderate risk at this time.  Time:   Today, I have spent 20 minutes directly with the patient on video discussing medical  problems including CAD, HTN, lipids, OSA, obesity with healthy diet and exercise.  We also reviewed the symptoms of COVID 19 and the ways to protect against contracting the virus with telehealth technology.  I spent an additional 5 minutes reviewing patient's chart including PAP compliance download.  Medication Adjustments/Labs and Tests Ordered: Current medicines are reviewed at length with the patient today.  Concerns regarding medicines are outlined above.  Tests Ordered: No orders of the defined types were placed in this encounter.  Medication Changes: No orders of the defined types were placed in this encounter.   Disposition:  Follow up in 1 year(s)  Signed, Fransico Him, MD  11/26/2018 2:28 PM    [place]

## 2018-11-25 NOTE — Telephone Encounter (Signed)
Video/[email or web address]/Smartphone [phone]/verbal consent 11/25/18   YOUR CARDIOLOGY TEAM HAS ARRANGED FOR AN E-VISIT FOR YOUR APPOINTMENT - PLEASE REVIEW IMPORTANT INFORMATION BELOW SEVERAL DAYS PRIOR TO YOUR APPOINTMENT  Due to the recent COVID-19 pandemic, we are transitioning in-person office visits to tele-medicine visits in an effort to decrease unnecessary exposure to our patients, their families, and staff. These visits are billed to your insurance just like a normal visit is. We also encourage you to sign up for MyChart if you have not already done so. You will need a smartphone if possible. For patients that do not have this, we can still complete the visit using a regular telephone but do prefer a smartphone to enable video when possible. You may have a family member that lives with you that can help. If possible, we also ask that you have a blood pressure cuff and scale at home to measure your blood pressure, heart rate and weight prior to your scheduled appointment. Patients with clinical needs that need an in-person evaluation and testing will still be able to come to the office if absolutely necessary. If you have any questions, feel free to call our office.     YOUR PROVIDER WILL BE USING THE FOLLOWING PLATFORM TO COMPLETE YOUR VISIT:   [email or web address]   IF USING MYCHART - How to Download the MyChart App to Your SmartPhone   - If Apple, go to [employer] and type in MyChart in the search bar and download the app. If Android, ask patient to go to [employer] and type in [place] in the search bar and download the app. The app is free but as with any other app downloads, your phone may require you to verify saved payment information or Apple/Android password.  - You will need to then log into the app with your MyChart username and password, and select [facility] as your healthcare provider to link the account.  - When it is time for your visit, go to the MyChart app, find appointments, and  click Begin Video Visit. Be sure to Select Allow for your device to access the Microphone and Camera for your visit. You will then be connected, and your provider will be with you shortly.  **If you have any issues connecting or need assistance, please contact MyChart service desk [phone])**  **If using a computer, in order to ensure the best quality for your visit, you will need to use either of the following Internet Browsers: Insurance underwriter or Microsoft Edge**   IF USING DOXIMITY or [email or web address].ME - The staff will give you instructions on receiving your link to join the meeting the day of your visit.      2-3 DAYS BEFORE YOUR APPOINTMENT  You will receive a telephone call from one of our [place] team members - your caller ID may say "Unknown caller." If this is a video visit, we will walk you through how to get the video launched on your phone. We will remind you check your blood pressure, heart rate and weight prior to your scheduled appointment. If you have an Apple Watch or Kardia, please upload any pertinent ECG strips the day before or morning of your appointment to [place]. Our staff will also make sure you have reviewed the consent and agree to move forward with your scheduled tele-health visit.     THE DAY OF YOUR APPOINTMENT  Approximately 15 minutes prior to your scheduled appointment, you will receive a telephone call from one of [facility]  team - your caller ID may say "Unknown caller."  Our staff will confirm medications, vital signs for the day and any symptoms you may be experiencing. Please have this information available prior to the time of visit start. It may also be helpful for you to have a pad of paper and pen handy for any instructions given during your visit. They will also walk you through joining the smartphone meeting if this is a video visit.    CONSENT FOR TELE-HEALTH VISIT - PLEASE REVIEW  I hereby voluntarily request, consent and authorize [facility] and its employed or contracted physicians, physician assistants, nurse practitioners or other licensed health care professionals (the Practitioner), to provide me with telemedicine health care services (the Services") as deemed necessary by the treating Practitioner. I acknowledge and consent to receive the Services by the Practitioner via telemedicine. I understand that the telemedicine visit will involve communicating with the Practitioner through live audiovisual communication technology and the disclosure of certain medical information by electronic transmission. I acknowledge that I have been given the opportunity to request an in-person assessment or other available alternative prior to the telemedicine visit and am voluntarily participating in the telemedicine visit.  I understand that I have the right to withhold or withdraw my consent to the use of telemedicine in the course of my care at any time, without affecting my right to future care or treatment, and that the Practitioner or I may terminate the telemedicine visit at any time. I understand that I have the right to inspect all information obtained and/or recorded in the course of the telemedicine visit and may receive copies of available information for a reasonable fee.  I understand that some of the potential risks of receiving the Services via telemedicine include:   Delay or interruption in medical evaluation due to technological equipment failure or disruption;  Information transmitted may not be sufficient (e.g. poor resolution of images) to allow for appropriate medical decision making by the Practitioner; and/or   In rare instances, security protocols could fail, causing a breach of personal health information.  Furthermore, I acknowledge that it is my responsibility to provide information about my medical history, conditions and care that is complete and accurate to the best of my ability. I acknowledge that Practitioner's  advice, recommendations, and/or decision may be based on factors not within their control, such as incomplete or inaccurate data provided by me or distortions of diagnostic images or specimens that may result from electronic transmissions. I understand that the practice of medicine is not an exact science and that Practitioner makes no warranties or guarantees regarding treatment outcomes. I acknowledge that I will receive a copy of this consent concurrently upon execution via email to the email address I last provided but may also request a printed copy by calling the office of [facility].    I understand that my insurance will be billed for this visit.   I have read or had this consent read to me.  I understand the contents of this consent, which adequately explains the benefits and risks of the Services being provided via telemedicine.   I have been provided ample opportunity to ask questions regarding this consent and the Services and have had my questions answered to my satisfaction.  I give my informed consent for the services to be provided through the use of telemedicine in my medical care  By participating in this telemedicine visit I agree to the above.

## 2018-11-26 ENCOUNTER — Encounter: Payer: Self-pay | Admitting: Cardiology

## 2018-11-26 ENCOUNTER — Other Ambulatory Visit: Payer: Self-pay

## 2018-11-26 ENCOUNTER — Telehealth (INDEPENDENT_AMBULATORY_CARE_PROVIDER_SITE_OTHER): Payer: Medicare Other | Admitting: Cardiology

## 2018-11-26 VITALS — Ht 66.5 in | Wt 226.4 lb

## 2018-11-26 DIAGNOSIS — G4733 Obstructive sleep apnea (adult) (pediatric): Secondary | ICD-10-CM | POA: Diagnosis not present

## 2018-11-26 DIAGNOSIS — Z7189 Other specified counseling: Secondary | ICD-10-CM

## 2018-11-26 DIAGNOSIS — E7800 Pure hypercholesterolemia, unspecified: Secondary | ICD-10-CM

## 2018-11-26 DIAGNOSIS — E669 Obesity, unspecified: Secondary | ICD-10-CM

## 2018-11-26 DIAGNOSIS — I10 Essential (primary) hypertension: Secondary | ICD-10-CM | POA: Diagnosis not present

## 2018-11-26 DIAGNOSIS — I2510 Atherosclerotic heart disease of native coronary artery without angina pectoris: Secondary | ICD-10-CM

## 2018-11-26 DIAGNOSIS — R0602 Shortness of breath: Secondary | ICD-10-CM

## 2018-11-26 NOTE — Patient Instructions (Addendum)
Medication Instructions:  Your physician recommends that you continue on your current medications as directed. Please refer to the Current Medication list given to you today.  If you need a refill on your cardiac medications before your next appointment, please call your pharmacy.   Lab work: None If you have labs (blood work) drawn today and your tests are completely normal, you will receive your results only by: Marland Kitchen MyChart Message (if you have MyChart) OR . A paper copy in the mail If you have any lab test that is abnormal or we need to change your treatment, we will call you to review the results.  Testing/Procedures: Your physician has requested that you have a lexiscan myoview. For further information please visit [email or web address]. Please follow instruction sheet, as given.  Follow-Up: At [facility], you and your health needs are our priority.  As part of our continuing mission to provide you with exceptional heart care, we have created designated Provider Care Teams.  These Care Teams include your primary Cardiologist (physician) and Advanced Practice Providers (APPs -  Physician Assistants and Nurse Practitioners) who all work together to provide you with the care you need, when you need it. You will need a follow up appointment in 1 years.  Please call our office 2 months in advance to schedule this appointment.  You may see Dr. Radford Pax or one of the following Advanced Practice Providers on your designated Care Team:   [place], PA-C Melina Copa, PA-C . Ermalinda Barrios, PA-C  You are scheduled for a Myocardial Perfusion Imaging Study. Please arrive 15 minutes prior to your appointment time for registration and insurance purposes.  The test will take approximately 3 to 4 hours to complete; you may bring reading material.  If someone comes with you to your appointment, they will need to remain in the main lobby due to limited space in the testing area. **If you are pregnant  or breastfeeding, please notify the nuclear lab prior to your appointment**  How to prepare for your Myocardial Perfusion Test: . Do not eat or drink 3 hours prior to your test, except you may have water. . Do not consume products containing caffeine (regular or decaffeinated) 12 hours prior to your test. (ex: coffee, chocolate, sodas, tea). . Do wear comfortable clothes (no dresses or overalls) and walking shoes, tennis shoes preferred (No heels or open toe shoes are allowed). . Do NOT wear cologne, perfume, aftershave, or lotions (deodorant is allowed). . If these instructions are not followed, your test will have to be rescheduled.  Please report to [address], [address] for your test.  If you have questions or concerns about your appointment, you can call the Nuclear Lab at [phone].  If you cannot keep your appointment, please provide 24 hours notification to the Nuclear Lab, to avoid a possible $50 charge to your account.

## 2018-11-27 ENCOUNTER — Telehealth: Payer: Self-pay | Admitting: Cardiology

## 2018-11-27 NOTE — Telephone Encounter (Signed)
Encounter not needed

## 2018-11-27 NOTE — Telephone Encounter (Signed)
New message:    Patient calling stating some one called her.

## 2018-11-27 NOTE — Telephone Encounter (Signed)
Confirmed with nuclear scheduler she called the patient to schedule stress test.  To Jari Sportsman for follow-up.

## 2018-12-02 ENCOUNTER — Ambulatory Visit: Payer: Medicare Other | Admitting: Orthotics

## 2018-12-02 ENCOUNTER — Other Ambulatory Visit: Payer: Self-pay | Admitting: Cardiology

## 2018-12-02 ENCOUNTER — Other Ambulatory Visit: Payer: Self-pay

## 2018-12-02 DIAGNOSIS — M722 Plantar fascial fibromatosis: Secondary | ICD-10-CM

## 2018-12-02 DIAGNOSIS — M79675 Pain in left toe(s): Principal | ICD-10-CM

## 2018-12-02 DIAGNOSIS — B351 Tinea unguium: Secondary | ICD-10-CM

## 2018-12-02 MED ORDER — ATORVASTATIN CALCIUM 40 MG PO TABS: 40.0000 mg | ORAL_TABLET | Freq: Every day | ORAL | 3 refills | Status: DC

## 2018-12-02 NOTE — Progress Notes (Signed)
Added shoe lift RT (to go in/out of shoes, not on f/o)

## 2018-12-15 ENCOUNTER — Other Ambulatory Visit: Payer: Self-pay | Admitting: Cardiology

## 2018-12-19 ENCOUNTER — Encounter: Payer: Self-pay | Admitting: Cardiology

## 2018-12-26 ENCOUNTER — Telehealth: Payer: Self-pay | Admitting: Podiatry

## 2018-12-26 NOTE — Telephone Encounter (Signed)
I informed pt I did not see a ammonium lactate prescription from our office, I would inform Dr. Prudence Davidson, but if she liked she could use OTC AmLactin. Pt requested the refill be requested.

## 2018-12-26 NOTE — Telephone Encounter (Signed)
Pt has requested Ammonium Lactate refill. Pharmacy has sent it twice

## 2018-12-27 MED ORDER — AMMONIUM LACTATE 12 % EX CREA: TOPICAL_CREAM | CUTANEOUS | 3 refills | Status: DC | PRN

## 2018-12-27 NOTE — Telephone Encounter (Signed)
Dr. Prudence Davidson states he prescribes LacHydrin and can order for pt. Left message informing pt of the LacHydrin order.

## 2018-12-27 NOTE — Addendum Note (Signed)
Addended by: Harriett Sine D on: 12/27/2018 08:38 AM   Modules accepted: Orders

## 2019-01-06 ENCOUNTER — Telehealth (HOSPITAL_COMMUNITY): Payer: Self-pay | Admitting: *Deleted

## 2019-01-06 NOTE — Telephone Encounter (Signed)
Patient given detailed instructions per Myocardial Perfusion Study Information Sheet for the test on 01/08/19. Patient notified to arrive 15 minutes early and that it is imperative to arrive on time for appointment to keep from having the test rescheduled.  If you need to cancel or reschedule your appointment, please call the office within 24 hours of your appointment. . Patient verbalized understanding. Kirstie Peri

## 2019-01-08 ENCOUNTER — Ambulatory Visit (HOSPITAL_COMMUNITY): Payer: Medicare Other | Attending: Cardiovascular Disease

## 2019-01-08 ENCOUNTER — Other Ambulatory Visit: Payer: Self-pay

## 2019-01-08 DIAGNOSIS — R0602 Shortness of breath: Secondary | ICD-10-CM | POA: Diagnosis present

## 2019-01-08 LAB — MYOCARDIAL PERFUSION IMAGING
LV dias vol: 70 mL (ref 46–106)
LV sys vol: 26 mL
Peak HR: 97 {beats}/min
Rest HR: 69 {beats}/min
SDS: 2
SRS: 0
SSS: 2
TID: 1

## 2019-01-08 MED ORDER — REGADENOSON 0.4 MG/5ML IV SOLN
0.4000 mg | Freq: Once | INTRAVENOUS | Status: AC
  Administered 2019-01-08: 0.4 mg via INTRAVENOUS

## 2019-01-08 MED ORDER — TECHNETIUM TC 99M TETROFOSMIN IV KIT
31.8000 | PACK | Freq: Once | INTRAVENOUS | Status: AC | PRN
  Administered 2019-01-08: 31.8 via INTRAVENOUS
  Filled 2019-01-08: qty 32

## 2019-01-08 MED ORDER — TECHNETIUM TC 99M TETROFOSMIN IV KIT
10.8000 | PACK | Freq: Once | INTRAVENOUS | Status: AC | PRN
  Administered 2019-01-08: 10.8 via INTRAVENOUS
  Filled 2019-01-08: qty 11

## 2019-01-09 ENCOUNTER — Encounter: Payer: Self-pay | Admitting: Cardiology

## 2019-01-09 NOTE — Telephone Encounter (Signed)
Error

## 2019-01-09 NOTE — Telephone Encounter (Signed)
Follow Up:; ° ° °Returning your call. °

## 2019-01-10 ENCOUNTER — Other Ambulatory Visit: Payer: Self-pay

## 2019-01-10 MED ORDER — AMMONIUM LACTATE 12 % EX CREA: TOPICAL_CREAM | CUTANEOUS | 3 refills | Status: DC | PRN

## 2019-01-16 ENCOUNTER — Other Ambulatory Visit: Payer: Medicare Other | Admitting: Orthotics

## 2019-04-18 ENCOUNTER — Other Ambulatory Visit: Payer: Self-pay | Admitting: Cardiology

## 2019-04-18 MED ORDER — LISINOPRIL 5 MG PO TABS: 5.0000 mg | ORAL_TABLET | Freq: Every day | ORAL | 2 refills | Status: DC

## 2019-04-18 MED ORDER — ATORVASTATIN CALCIUM 40 MG PO TABS: 40.0000 mg | ORAL_TABLET | Freq: Every day | ORAL | 2 refills | Status: DC

## 2019-04-18 MED ORDER — EZETIMIBE 10 MG PO TABS: 10.0000 mg | ORAL_TABLET | Freq: Every day | ORAL | 2 refills | Status: DC

## 2019-04-18 NOTE — Telephone Encounter (Signed)
Pt's medications were sent to pt's pharmacy as requested. Confirmation received.  

## 2019-04-18 NOTE — Telephone Encounter (Signed)
New Message    *STAT* If patient is at the pharmacy, call can be transferred to refill team.   1. Which medications need to be refilled? (please list name of each medication and dose if known) atorvastatin (LIPITOR) 40 MG tablet, ezetimibe (ZETIA) 10 MG tablet and lisinopril (ZESTRIL) 5 MG tablet     2. Which pharmacy/location (including street and city if local pharmacy) is medication to be sent to? [place]  3. Do they need a 30 day or 90 day supply? 90  Patient is switching pharmacy needs a new rx sent to [facility]

## 2019-08-19 ENCOUNTER — Ambulatory Visit: Payer: Medicare PPO | Attending: Internal Medicine

## 2019-08-19 DIAGNOSIS — Z23 Encounter for immunization: Secondary | ICD-10-CM | POA: Insufficient documentation

## 2019-08-19 NOTE — Progress Notes (Signed)
   Covid-19 Vaccination Clinic  Name:  [name]    MRN: [id number] DOB: 1951-05-03  08/19/2019  [name] was observed post Covid-19 immunization for 15 minutes without incidence. She was provided with Vaccine Information Sheet and instruction to access the V-Safe system.   [name] was instructed to call 911 with any severe reactions post vaccine: Marland Kitchen Difficulty breathing  . Swelling of your face and throat  . A fast heartbeat  . A bad rash all over your body  . Dizziness and weakness    Immunizations Administered    Name Date Dose VIS Date Route   Pfizer COVID-19 Vaccine 08/19/2019  4:01 PM 0.3 mL 07/11/2019 Intramuscular   Manufacturer: [place]   Lot: [id number]   [place]: [id number]

## 2019-09-06 ENCOUNTER — Ambulatory Visit: Payer: Medicare PPO | Attending: Internal Medicine

## 2019-09-06 DIAGNOSIS — Z23 Encounter for immunization: Secondary | ICD-10-CM | POA: Insufficient documentation

## 2019-09-06 NOTE — Progress Notes (Signed)
   Covid-19 Vaccination Clinic  Name:  [name]    MRN: [id number] DOB: 1951-01-06  09/06/2019  [name] was observed post Covid-19 immunization for 15 minutes without incidence. She was provided with Vaccine Information Sheet and instruction to access the V-Safe system.   [name] was instructed to call 911 with any severe reactions post vaccine: Marland Kitchen Difficulty breathing  . Swelling of your face and throat  . A fast heartbeat  . A bad rash all over your body  . Dizziness and weakness    Immunizations Administered    Name Date Dose VIS Date Route   Pfizer COVID-19 Vaccine 09/06/2019 11:00 AM 0.3 mL 07/11/2019 Intramuscular   Manufacturer: [place]   Lot: [place]   [place]: [phone]

## 2019-09-29 ENCOUNTER — Other Ambulatory Visit: Payer: Self-pay | Admitting: Family Medicine

## 2019-09-29 DIAGNOSIS — Z1231 Encounter for screening mammogram for malignant neoplasm of breast: Secondary | ICD-10-CM

## 2019-10-15 ENCOUNTER — Other Ambulatory Visit: Payer: Self-pay | Admitting: Family Medicine

## 2019-10-15 DIAGNOSIS — N6310 Unspecified lump in the right breast, unspecified quadrant: Secondary | ICD-10-CM

## 2019-11-05 DIAGNOSIS — M1990 Unspecified osteoarthritis, unspecified site: Secondary | ICD-10-CM | POA: Diagnosis not present

## 2019-11-05 DIAGNOSIS — I10 Essential (primary) hypertension: Secondary | ICD-10-CM | POA: Diagnosis not present

## 2019-11-05 DIAGNOSIS — I2510 Atherosclerotic heart disease of native coronary artery without angina pectoris: Secondary | ICD-10-CM | POA: Diagnosis not present

## 2019-11-05 DIAGNOSIS — E1149 Type 2 diabetes mellitus with other diabetic neurological complication: Secondary | ICD-10-CM | POA: Diagnosis not present

## 2019-11-05 DIAGNOSIS — E7800 Pure hypercholesterolemia, unspecified: Secondary | ICD-10-CM | POA: Diagnosis not present

## 2019-11-05 DIAGNOSIS — E1169 Type 2 diabetes mellitus with other specified complication: Secondary | ICD-10-CM | POA: Diagnosis not present

## 2019-11-05 DIAGNOSIS — G4700 Insomnia, unspecified: Secondary | ICD-10-CM | POA: Diagnosis not present

## 2019-11-05 DIAGNOSIS — F329 Major depressive disorder, single episode, unspecified: Secondary | ICD-10-CM | POA: Diagnosis not present

## 2019-11-05 DIAGNOSIS — D649 Anemia, unspecified: Secondary | ICD-10-CM | POA: Diagnosis not present

## 2019-11-07 ENCOUNTER — Other Ambulatory Visit: Payer: Medicare PPO

## 2019-11-07 ENCOUNTER — Telehealth: Payer: Self-pay | Admitting: *Deleted

## 2019-11-07 NOTE — Telephone Encounter (Signed)
Pt called states she has a split in the heel and the ingrown toenail that was removed, looks like it is growing back.

## 2019-11-07 NOTE — Telephone Encounter (Signed)
I spoke with pt and she states she is using the antibiotic cream Dr. Prudence Davidson gave her. I asked pt for the name of the ointment and she stated Ammonium Lactate. I told pt it was a type of moisturizer not an antimitotic. I instructed pt to use an antibiotic ointment on a gauze pad for both the heel and the toe so the areas would not get too moist then dry too quickly worsening the crack, and since she had not been seen in almost a year and was diabetic she needed to come in. Pt agreed and I transferred to scheduler.

## 2019-11-10 ENCOUNTER — Other Ambulatory Visit: Payer: Self-pay | Admitting: Podiatrist

## 2019-11-10 ENCOUNTER — Other Ambulatory Visit: Payer: Self-pay

## 2019-11-10 ENCOUNTER — Ambulatory Visit (INDEPENDENT_AMBULATORY_CARE_PROVIDER_SITE_OTHER): Payer: Medicare PPO

## 2019-11-10 ENCOUNTER — Ambulatory Visit: Payer: Medicare PPO | Admitting: Podiatrist

## 2019-11-10 VITALS — Temp 96.7°F

## 2019-11-10 DIAGNOSIS — K219 Gastro-esophageal reflux disease without esophagitis: Secondary | ICD-10-CM | POA: Diagnosis not present

## 2019-11-10 DIAGNOSIS — R234 Changes in skin texture: Secondary | ICD-10-CM | POA: Diagnosis not present

## 2019-11-10 DIAGNOSIS — M7751 Other enthesopathy of right foot: Secondary | ICD-10-CM

## 2019-11-10 DIAGNOSIS — Z7984 Long term (current) use of oral hypoglycemic drugs: Secondary | ICD-10-CM | POA: Diagnosis not present

## 2019-11-10 DIAGNOSIS — L923 Foreign body granuloma of the skin and subcutaneous tissue: Secondary | ICD-10-CM | POA: Diagnosis not present

## 2019-11-10 DIAGNOSIS — I10 Essential (primary) hypertension: Secondary | ICD-10-CM | POA: Diagnosis not present

## 2019-11-10 DIAGNOSIS — L84 Corns and callosities: Secondary | ICD-10-CM | POA: Diagnosis not present

## 2019-11-10 DIAGNOSIS — R319 Hematuria, unspecified: Secondary | ICD-10-CM | POA: Diagnosis not present

## 2019-11-10 DIAGNOSIS — R310 Gross hematuria: Secondary | ICD-10-CM | POA: Diagnosis not present

## 2019-11-10 DIAGNOSIS — E7800 Pure hypercholesterolemia, unspecified: Secondary | ICD-10-CM | POA: Diagnosis not present

## 2019-11-10 DIAGNOSIS — E1169 Type 2 diabetes mellitus with other specified complication: Secondary | ICD-10-CM | POA: Diagnosis not present

## 2019-11-10 DIAGNOSIS — M1990 Unspecified osteoarthritis, unspecified site: Secondary | ICD-10-CM | POA: Diagnosis not present

## 2019-11-10 DIAGNOSIS — R809 Proteinuria, unspecified: Secondary | ICD-10-CM | POA: Diagnosis not present

## 2019-11-10 NOTE — Patient Instructions (Signed)
Go on [employer] and they have silicone heel sleeves-  "sleep n heel" was a name brand but there are several you can choose from  Keep using your lotion during the day-  i'll order you a stronger version to take at night-  It may be from a compounding pharmacy.  I have ordered a medication for you that will come from [facility] in [place]. They should be calling you to verify insurance and will mail the medication to you. If you live close by then you can go by their pharmacy to pick up the medication. Their phone number is [phone]. If you do not hear from them in the next few days, please give [name] a call at [phone].

## 2019-11-11 ENCOUNTER — Telehealth: Payer: Self-pay

## 2019-11-11 NOTE — Telephone Encounter (Signed)
Pt called in regards to medication prescribed. Pt informed medication through [facility] was not covered by insurance and would be $180 out of pocket. Pt informed office had sent in an alternative to [facility] which is cheaper and just as effective. Pt elected to pick up [facility] prescription and disregard the one at [facility].

## 2019-11-13 NOTE — Progress Notes (Signed)
Chief Complaint  Patient presents with  . Skin Problem    C/o dry, cracked skin - worse on R foot. Pt stated, "The fissure in my heel has improved since I started applying Neosporin".  . Skin Problem    Dark spots - R heel and R plantar forefoot submet 4. Pt stated, "I think I might have slivers [of wood] from walking on my deck. The areas are tender.  . Nail Problem    L hallux - nail previously removed. Pt believes that the nail is growing back. Also c/o thickened, painful toenails.  . Foot Problem    After previous concerns were addressed, pt added, "I have a lump on the back of my heel. It's not painful, but I also have a bone spur that hurts sometimes. x3 years".     HPI: Patient is 69 y.o. female who presents today for several concerns as stated above.  Review of Systems  DATA OBTAINED: from patient  GENERAL: Feels well no fevers, no fatigue, no changes in appetite SKIN: No itching, no rashes, no open wounds EYES: No eye pain,no redness, no discharge EARS: No earache,no ringing of ears, NOSE: No congestion, no drainage, no bleeding  MOUTH/THROAT: No mouth pain, No sore throat, No difficulty chewing or swallowing  RESPIRATORY: No cough, no wheezing, no SOB CARDIAC: No chest pain,no heart palpitations, GI: No abdominal pain, No Nausea, no vomiting, no diarrhea, no heartburn or no reflux  GU: No dysuria, no increased frequency or urgency MUSCULOSKELETAL: No unrelieved bone/joint pain,  NEUROLOGIC: Awake, alert, appropriate to situation, No change in mental status. PSYCHIATRIC: No overt anxiety or sadness.No behavior issue.      Physical Exam  GENERAL APPEARANCE: Alert, conversant. Appropriately groomed. No acute distress.   VASCULAR: Pedal pulses palpable DP and PT bilateral.  Capillary refill time is immediate to all digits,  Proximal to distal cooling it warm to warm.  Digital hair growth is present bilateral   NEUROLOGIC: sensation is intact epicritically and  protectively to 5.07 monofilament at 5/5 sites bilateral.  Light touch is intact bilateral, vibratory sensation intact bilateral, achilles tendon reflex is intact bilateral.   MUSCULOSKELETAL: acceptable muscle strength, tone and stability bilateral.  Intrinsic muscluature intact bilateral.  Range of motion at ankle and first MPJ is normal bilateral.  A palpable protrusion on the posterior aspect of the left heel is appreciated.  Most likely secondary to the previous Achilles surgery and where the Achilles tendon was reattached.  The integrity of the tendon Achilles tendon does appear to be intact.  DERMATOLOGIC: skin is warm, supple, and dry.  Several calluses are present bilateral feet pain at the first metatarsal head medially the left heel medially the right heel central lateral and a heel fissure on the right which is painful with pressure.  There are also 2 tiny darkened lesions measuring 1 mm in diameter on the center of the heel in the center of the metatarsals which appears to be bruising in nature likely from accidentally stepping on a rock or stick in the driveway.  The left hallux has residual nail spicules on the medial and lateral borders from the previous ingrown nail procedure.  Heel fissure posterior aspect of the right heel is noted.  X-ray evaluation 3 views of the right foot are obtained.  Small posterior spur is noted on the calcaneus.  Soft tissue prominence over the posterior heel was also seen.  Otherwise x-rays appear normal   Assessment     ICD-10-CM  1. Foreign body granuloma of skin and subcutaneous tissue  L92.3 CANCELED: DG Foot Complete Right  2. Callus of foot  L84   3. Fissured skin  R23.4    igrowing nail borders s/p prior ingrown nail removal left hallux Achilles tendonitis s/p prior achilles tendon surgery and likely bone spur resection   Plan  Discussed treatment options and alternatives for her concerns.  Debrided the calluses with a #15 blade without  complication.  Debrided the right heel fissure as well.  The small dark spots do appear to be contusions and I did instructed her to keep an eye on these and make sure that they go away.  Discussed that the medial lateral nail borders could be removed again however the need to be longer so that they can be completely excised.  In the left heel I discussed that this was likely from her previous surgery and that no treatment is recommended at this time.  She will be seen back as needed for follow-up.

## 2019-11-18 ENCOUNTER — Ambulatory Visit
Admission: RE | Admit: 2019-11-18 | Discharge: 2019-11-18 | Disposition: A | Discharge status: Home / Self Care | Payer: Medicare PPO | Source: Ambulatory Visit | Attending: Family Medicine | Admitting: Family Medicine

## 2019-11-18 ENCOUNTER — Other Ambulatory Visit: Payer: Self-pay | Admitting: Family Medicine

## 2019-11-18 ENCOUNTER — Other Ambulatory Visit: Payer: Self-pay

## 2019-11-18 DIAGNOSIS — R2232 Localized swelling, mass and lump, left upper limb: Secondary | ICD-10-CM

## 2019-11-18 DIAGNOSIS — R922 Inconclusive mammogram: Secondary | ICD-10-CM | POA: Diagnosis not present

## 2019-11-18 DIAGNOSIS — N6489 Other specified disorders of breast: Secondary | ICD-10-CM | POA: Diagnosis not present

## 2019-11-18 DIAGNOSIS — N6310 Unspecified lump in the right breast, unspecified quadrant: Secondary | ICD-10-CM

## 2019-12-08 ENCOUNTER — Telehealth: Payer: Self-pay | Admitting: Cardiology

## 2019-12-08 NOTE — Telephone Encounter (Signed)
Spoke with the patient who states that she normally has lab work done prior to her annual visits with Dr. Radford Pax and is wondering why this is not scheduled. I advised her that I could reach out to Dr. Radford Pax for lab work orders if she would like to come in tomorrow to get that done or if she was unable to do so we could set up lab work after her visit with Dr. Radford Pax. She states that she will just wait until she talks with Dr.Turner on Wednesday.

## 2019-12-08 NOTE — Telephone Encounter (Signed)
   Pt wanted to know If she needs any blood work before for televisit with Dr. Radford Pax

## 2019-12-09 NOTE — Progress Notes (Addendum)
Virtual Visit via Telephone Note   This visit type was conducted due to national recommendations for restrictions regarding the COVID-19 Pandemic (e.g. social distancing) in an effort to limit this patient's exposure and mitigate transmission in our community.  Due to her co-morbid illnesses, this patient is at least at moderate risk for complications without adequate follow up.  This format is felt to be most appropriate for this patient at this time.  The patient did not have access to video technology/had technical difficulties with video requiring transitioning to audio format only (telephone).  All issues noted in this document were discussed and addressed.  No physical exam could be performed with this format.  Please refer to the patient's chart for her  consent to telehealth for [facility].   Evaluation Performed:  Follow-up visit  This visit type was conducted due to national recommendations for restrictions regarding the COVID-19 Pandemic (e.g. social distancing).  This format is felt to be most appropriate for this patient at this time.  All issues noted in this document were discussed and addressed.  No physical exam was performed (except for noted visual exam findings with Video Visits).  Please refer to the patient's chart (MyChart message for video visits and phone note for telephone visits) for the patient's consent to telehealth for [facility].  Date:  12/10/2019   ID:  [name], DOB 1950-12-22, MRN [id number]  Patient Location:  Home  Provider location:   [place]  PCP:  Kathyrn Lass, MD  Cardiologist:  Fransico Him, MD  Electrophysiologist:  None   Chief Complaint:  OSA/CAD/HTN/Obesity  History of Present Illness:    [name] is a 69 y.o. female who presents via audio/video conferencing for a telehealth visit today.    [name] Egekvistis a 69 y.o.femalewith a hx of nonobstructive ASCAD with 20% OM, HTN, dyslipidemia, and OSA on CPAP.   She is here today for followup and is doing well.  She has had some DOE with walking but has been very sedentary during the COVID crisis.  She has some intermittent ankle edema.  She denies any chest pain or pressure,  PND, orthopnea,  Dizziness (except when she rolls over in bed some), palpitations (except for a skipped beat once in a while) or syncope. She is compliant with her meds and is tolerating meds with no SE.     She is doing well with her CPAP device and thinks that she has gotten used to it.  She tolerates the nasal mask and feels the pressure is adequate.  Since going on CPAP she feels rested in the am and has no significant daytime sleepiness.  She denies any significant mouth or nasal dryness or nasal congestion.  She does not think that he snores.    The patient does not have symptoms concerning for COVID-19 infection (fever, chills, cough, or new shortness of breath).   Prior CV studies:   The following studies were reviewed today:  PAP compliance download  Past Medical History:  Diagnosis Date  . Arthritis   . Chronic fatigue    and weakness  . Colon polyps   . Coronary artery disease    nonobstructive with 20% OM by cath 2006  . Depression   . Diabetes mellitus without complication ([place])   . Diastolic dysfunction    w Elevated LVEDP  . DJD (degenerative joint disease)    C3/4 Dr Sherwood Gambler  . GERD (gastroesophageal reflux disease)   . H/O hiatal hernia   .  High cholesterol   . Hypertension   . Hypoglycemia   . Iron deficiency anemia   . Kidney stones   . Multiple thyroid nodules   . Neurocardiogenic syncope   . Obesity   . Prediabetes 2011 & 2012  . Right knee pain 12/2009   Dr Marlou Sa  . Sleep apnea    CPAP had Sleep Study ordered by Dr. Radford Pax  . Tick bite 11/2016  . Vasovagal syncope    Past Surgical History:  Procedure Laterality Date  . ACHILLES TENDON SURGERY Left 01/23/2013   Procedure: EXCISION PARTIAL BONE TALUS/CALCANEUS, REPAIR RUPTURE ACHILLES  TENDON PRIMARY OPEN ;  Surgeon: Ninetta Lights, MD;  Location: [facility];  Service: Orthopedics;  Laterality: Left;  . ANTERIOR FUSION CERVICAL SPINE     x2-cervical  . BREAST BIOPSY Right 2007  . BREAST CYST EXCISION Left   . CARDIAC CATHETERIZATION  ~ 2008   Nonobstructive ASCAD, 20% OM  . CARPAL TUNNEL RELEASE Left   . LITHOTRIPSY    . OTHER SURGICAL HISTORY  06/12/2018   Left big toe nail surgery. [facility]   . TONSILLECTOMY    . TOTAL KNEE ARTHROPLASTY Right 11/2010  . TOTAL KNEE ARTHROPLASTY  12/13/2011   Procedure: TOTAL KNEE ARTHROPLASTY;  Surgeon: Ninetta Lights, MD;  Location: [facility];  Service: Orthopedics;  Laterality: Left;  DR MURPHY WANTS 90 MINUTES FOR THIS CASE     Current Meds  Medication Sig  . ammonium lactate (LAC-HYDRIN) 12 % cream Apply topically as needed for dry skin.  Marland Kitchen aspirin 81 MG tablet Take 81 mg by mouth daily.  Marland Kitchen atorvastatin (LIPITOR) 40 MG tablet Take 1 tablet (40 mg total) by mouth daily.  Marland Kitchen CALCIUM PO Take 800 mg by mouth daily.  . Cholecalciferol (VITAMIN D3 PO) Take 2,000 Units by mouth daily.  . clonazePAM (KLONOPIN) 0.5 MG tablet TAKE 1 TABLET BY MOUTH AT BEDTIME EVERY DAY  . desvenlafaxine (PRISTIQ) 50 MG 24 hr tablet Take 50 mg by mouth daily.  Marland Kitchen esomeprazole (NEXIUM) 40 MG capsule Take 1 capsule (40 mg total) by mouth 2 (two) times daily before a meal. (Patient taking differently: Take 40 mg by mouth as needed. )  . ezetimibe (ZETIA) 10 MG tablet Take 1 tablet (10 mg total) by mouth daily.  Marland Kitchen FERROUS SULFATE PO Take 65 mg by mouth daily.  Marland Kitchen ibuprofen (ADVIL,MOTRIN) 200 MG tablet Take 400 mg by mouth as needed (pain).   Marland Kitchen lisinopril (ZESTRIL) 20 MG tablet 20 mg daily.   . meloxicam (MOBIC) 15 MG tablet Take 15 mg by mouth as needed.   . metFORMIN (GLUCOPHAGE) 500 MG tablet Take 500 mg by mouth 2 (two) times daily with a meal.   . NONFORMULARY OR COMPOUNDED ITEM Apply topically 2 (two) times daily. Salicylic Acid  [id number], Urea [id number] Cream, apply to the affected area once or twice daily  . Omega-3 Fatty Acids (FISH OIL) 1200 MG CAPS Take 3 capsules by mouth daily.  . traZODone (DESYREL) 50 MG tablet Take 100 mg by mouth at bedtime.    Current Facility-Administered Medications for the 12/10/19 encounter (Video Visit) with [name], MD  Medication  . 0.9 %  sodium chloride infusion     Allergies:   Guaifenesin & derivatives, Morphine and related, Penicillins, Tramadol, Hydrocodone-acetaminophen, and Oxycodone hcl   Social History   Tobacco Use  . Smoking status: Never Smoker  . Smokeless tobacco: Never Used  Substance Use Topics  .  Alcohol use: Yes    Alcohol/week: 6.0 standard drinks    Types: 6 Glasses of wine per week    Comment: couple times per week.  . Drug use: No     Family Hx: The patient's family history includes Breast cancer in her sister; CAD in her father; CVA in her mother; Cancer in her paternal aunt; Cancer - Prostate in her father; Colon cancer in an other family member; Crohn's disease in her paternal aunt; Diverticulosis in her father, mother, and sister; Heart disease in her father and paternal grandfather; Hypertension in her sister; Kidney disease in her mother.  ROS:   Please see the history of present illness.     All other systems reviewed and are negative.   Labs/Other Tests and Data Reviewed:    Recent Labs: No results found for requested labs within last 8760 hours.   Recent Lipid Panel Lab Results  Component Value Date/Time   CHOL 153 11/22/2018 01:21 PM   TRIG 154 (H) 11/22/2018 01:21 PM   HDL 50 11/22/2018 01:21 PM   CHOLHDL 3.1 11/22/2018 01:21 PM   CHOLHDL 3.9 08/05/2015 11:49 AM   LDLCALC 72 11/22/2018 01:21 PM   LDLDIRECT 93.3 09/17/2013 01:07 PM    Wt Readings from Last 3 Encounters:  12/10/19 225 lb (102.1 kg)  01/08/19 230 lb (104.3 kg)  11/26/18 226 lb 6.4 oz (102.7 kg)     Objective:    Vital Signs:  BP 129/70   Pulse 68   Ht 5'  6.5" (1.689 m)   Wt 225 lb (102.1 kg)   BMI 35.77 kg/m     ASSESSMENT & PLAN:    1.  OSA - The patient is tolerating PAP therapy well without any problems. The PAP download was reviewed today and showed an AHI of 0.9/hr on 11 cm H2O with 90% compliance in using more than 4 hours nightly.  The patient has been using and benefiting from PAP use and will continue to benefit from therapy.   2.  Hypertension  -BP controlled on exam -continue Lisinopril 20mg  daily  3.  Obesity  -  I have encouraged her to get into a routine exercise program and cut back on carbs and portions.  -I will refer her to the [facility] weight loss and healthy lifestyle program  4.  Nonobstructive ASCAD  -cath in 2006 showed 20% OM.   -She has had some DOE when walking up an incline but once it levels out her SOB resolves.  This is a chronic problem.  Leane Call 12/2018 showed no ischemia -continue ASA and statin  5.   Hyperlipidemia  - her LDL goal is < 70.  - I will get a copy of FLP from PCP - continue Zetia 10mg  daily and Atorvastatin 40mg  daily   COVID-19 Education: The signs and symptoms of COVID-19 were discussed with the patient and how to seek care for testing (follow up with PCP or arrange E-visit).  The importance of social distancing was discussed today.  Patient Risk:   After full review of this patient's clinical status, I feel that they are at least moderate risk at this time.  Time:   Today, I have spent 20 minutes on telemedicine discussing medical problems including OSA, CAD, HTN, HLD and reviewing patient's chart including outside labs from PCP and PAP compliance download.  Medication Adjustments/Labs and Tests Ordered: Current medicines are reviewed at length with the patient today.  Concerns regarding medicines are outlined above.  Tests  Ordered: No orders of the defined types were placed in this encounter.  Medication Changes: No orders of the defined types were placed in this  encounter.   Disposition:  Follow up in 1 year(s)  Signed, Fransico Him, MD  12/10/2019 9:29 AM    [facility]

## 2019-12-10 ENCOUNTER — Telehealth (INDEPENDENT_AMBULATORY_CARE_PROVIDER_SITE_OTHER): Payer: Medicare PPO | Admitting: Cardiology

## 2019-12-10 ENCOUNTER — Other Ambulatory Visit: Payer: Self-pay

## 2019-12-10 ENCOUNTER — Encounter: Payer: Self-pay | Admitting: Cardiology

## 2019-12-10 VITALS — BP 129/70 | HR 68 | Ht 66.5 in | Wt 225.0 lb

## 2019-12-10 DIAGNOSIS — G4733 Obstructive sleep apnea (adult) (pediatric): Secondary | ICD-10-CM

## 2019-12-10 DIAGNOSIS — E669 Obesity, unspecified: Secondary | ICD-10-CM

## 2019-12-10 DIAGNOSIS — E7800 Pure hypercholesterolemia, unspecified: Secondary | ICD-10-CM | POA: Diagnosis not present

## 2019-12-10 DIAGNOSIS — I10 Essential (primary) hypertension: Secondary | ICD-10-CM | POA: Diagnosis not present

## 2019-12-10 DIAGNOSIS — F341 Dysthymic disorder: Secondary | ICD-10-CM | POA: Diagnosis not present

## 2019-12-10 DIAGNOSIS — I2510 Atherosclerotic heart disease of native coronary artery without angina pectoris: Secondary | ICD-10-CM | POA: Diagnosis not present

## 2019-12-10 NOTE — Addendum Note (Signed)
Addended by: Antonieta Iba on: 12/10/2019 10:05 AM   Modules accepted: Orders

## 2019-12-10 NOTE — Patient Instructions (Addendum)
Medication Instructions:  Your physician recommends that you continue on your current medications as directed. Please refer to the Current Medication list given to you today.  *If you need a refill on your cardiac medications before your next appointment, please call your pharmacy*  Follow-Up: At [facility], you and your health needs are our priority.  As part of our continuing mission to provide you with exceptional heart care, we have created designated Provider Care Teams.  These Care Teams include your primary Cardiologist (physician) and Advanced Practice Providers (APPs -  Physician Assistants and Nurse Practitioners) who all work together to provide you with the care you need, when you need it.   Your next appointment:   1 year(s)  The format for your next appointment:   Either In Person or Virtual  Provider:   Fransico Him, MD   Other Instructions You have been referred to the weight loss program.

## 2019-12-11 DIAGNOSIS — G4700 Insomnia, unspecified: Secondary | ICD-10-CM | POA: Diagnosis not present

## 2019-12-11 DIAGNOSIS — F3341 Major depressive disorder, recurrent, in partial remission: Secondary | ICD-10-CM | POA: Diagnosis not present

## 2019-12-11 DIAGNOSIS — F900 Attention-deficit hyperactivity disorder, predominantly inattentive type: Secondary | ICD-10-CM | POA: Diagnosis not present

## 2019-12-11 DIAGNOSIS — F341 Dysthymic disorder: Secondary | ICD-10-CM | POA: Diagnosis not present

## 2019-12-11 DIAGNOSIS — F422 Mixed obsessional thoughts and acts: Secondary | ICD-10-CM | POA: Diagnosis not present

## 2019-12-11 DIAGNOSIS — F411 Generalized anxiety disorder: Secondary | ICD-10-CM | POA: Diagnosis not present

## 2019-12-15 ENCOUNTER — Telehealth: Payer: Self-pay

## 2019-12-15 ENCOUNTER — Other Ambulatory Visit: Payer: Self-pay | Admitting: Family Medicine

## 2019-12-15 DIAGNOSIS — E7800 Pure hypercholesterolemia, unspecified: Secondary | ICD-10-CM

## 2019-12-15 DIAGNOSIS — R310 Gross hematuria: Secondary | ICD-10-CM

## 2019-12-15 NOTE — Telephone Encounter (Signed)
Patient returning call.

## 2019-12-15 NOTE — Telephone Encounter (Signed)
-----   Message from Freada Bergeron, [place] sent at 12/12/2019 11:54 AM EDT ----- I think this goes to you. ----- Message ----- From: Elberta Fortis Sent: 12/12/2019  10:04 AM EDT To: Freada Bergeron, CMA  Patient needs to come in for FLP and ALT  Traci ----- Message ----- From: Elberta Fortis Sent: 12/11/2019  11:21 AM EDT To: [name], MD

## 2019-12-15 NOTE — Telephone Encounter (Signed)
Spoke with the patient and she will come in tomorrow fasting for blood work.

## 2019-12-16 ENCOUNTER — Other Ambulatory Visit: Payer: Medicare PPO

## 2019-12-17 ENCOUNTER — Other Ambulatory Visit: Payer: Self-pay

## 2019-12-17 ENCOUNTER — Other Ambulatory Visit: Payer: Medicare PPO

## 2019-12-17 ENCOUNTER — Ambulatory Visit
Admission: RE | Admit: 2019-12-17 | Discharge: 2019-12-17 | Disposition: A | Discharge status: Home / Self Care | Payer: Medicare PPO | Source: Ambulatory Visit | Attending: Family Medicine | Admitting: Family Medicine

## 2019-12-17 DIAGNOSIS — R310 Gross hematuria: Secondary | ICD-10-CM

## 2019-12-17 DIAGNOSIS — N132 Hydronephrosis with renal and ureteral calculous obstruction: Secondary | ICD-10-CM | POA: Diagnosis not present

## 2019-12-18 ENCOUNTER — Other Ambulatory Visit: Payer: Medicare PPO | Admitting: *Deleted

## 2019-12-18 DIAGNOSIS — E7800 Pure hypercholesterolemia, unspecified: Secondary | ICD-10-CM

## 2019-12-18 LAB — LIPID PANEL
Chol/HDL Ratio: 2.8 ratio (ref 0.0–4.4)
Cholesterol, Total: 133 mg/dL (ref 100–199)
HDL: 48 mg/dL (ref >39)
LDL Chol Calc (NIH): 58 mg/dL (ref 0–99)
Triglycerides: 158 mg/dL — ABNORMAL HIGH (ref 0–149)
VLDL Cholesterol Cal: 27 mg/dL (ref 5–40)

## 2019-12-18 LAB — ALT: ALT: 23 IU/L (ref 0–32)

## 2019-12-19 ENCOUNTER — Other Ambulatory Visit (HOSPITAL_COMMUNITY)
Admission: RE | Admit: 2019-12-19 | Discharge: 2019-12-19 | Disposition: A | Discharge status: Home / Self Care | Payer: Medicare Other | Source: Ambulatory Visit | Attending: Urology | Admitting: Urology

## 2019-12-19 ENCOUNTER — Telehealth: Payer: Self-pay | Admitting: Cardiology

## 2019-12-19 ENCOUNTER — Other Ambulatory Visit: Payer: Self-pay | Admitting: Urology

## 2019-12-19 DIAGNOSIS — Z885 Allergy status to narcotic agent status: Secondary | ICD-10-CM | POA: Diagnosis not present

## 2019-12-19 DIAGNOSIS — D509 Iron deficiency anemia, unspecified: Secondary | ICD-10-CM | POA: Diagnosis not present

## 2019-12-19 DIAGNOSIS — N132 Hydronephrosis with renal and ureteral calculous obstruction: Secondary | ICD-10-CM | POA: Diagnosis not present

## 2019-12-19 DIAGNOSIS — N200 Calculus of kidney: Secondary | ICD-10-CM

## 2019-12-19 DIAGNOSIS — E7800 Pure hypercholesterolemia, unspecified: Secondary | ICD-10-CM | POA: Diagnosis not present

## 2019-12-19 DIAGNOSIS — G4733 Obstructive sleep apnea (adult) (pediatric): Secondary | ICD-10-CM | POA: Diagnosis not present

## 2019-12-19 DIAGNOSIS — Z01812 Encounter for preprocedural laboratory examination: Secondary | ICD-10-CM | POA: Insufficient documentation

## 2019-12-19 DIAGNOSIS — Z880 Allergy status to penicillin: Secondary | ICD-10-CM | POA: Diagnosis not present

## 2019-12-19 DIAGNOSIS — K219 Gastro-esophageal reflux disease without esophagitis: Secondary | ICD-10-CM | POA: Diagnosis not present

## 2019-12-19 DIAGNOSIS — Z7982 Long term (current) use of aspirin: Secondary | ICD-10-CM | POA: Diagnosis not present

## 2019-12-19 DIAGNOSIS — E785 Hyperlipidemia, unspecified: Secondary | ICD-10-CM | POA: Diagnosis not present

## 2019-12-19 DIAGNOSIS — I2510 Atherosclerotic heart disease of native coronary artery without angina pectoris: Secondary | ICD-10-CM | POA: Diagnosis not present

## 2019-12-19 DIAGNOSIS — E669 Obesity, unspecified: Secondary | ICD-10-CM | POA: Diagnosis not present

## 2019-12-19 DIAGNOSIS — F329 Major depressive disorder, single episode, unspecified: Secondary | ICD-10-CM | POA: Diagnosis not present

## 2019-12-19 DIAGNOSIS — Z86010 Personal history of colonic polyps: Secondary | ICD-10-CM | POA: Diagnosis not present

## 2019-12-19 DIAGNOSIS — N99840 Postprocedural hematoma of a genitourinary system organ or structure following a genitourinary system procedure: Secondary | ICD-10-CM | POA: Diagnosis not present

## 2019-12-19 DIAGNOSIS — I10 Essential (primary) hypertension: Secondary | ICD-10-CM | POA: Diagnosis not present

## 2019-12-19 DIAGNOSIS — R55 Syncope and collapse: Secondary | ICD-10-CM | POA: Diagnosis not present

## 2019-12-19 DIAGNOSIS — R310 Gross hematuria: Secondary | ICD-10-CM | POA: Diagnosis not present

## 2019-12-19 DIAGNOSIS — Z6837 Body mass index (BMI) 37.0-37.9, adult: Secondary | ICD-10-CM | POA: Diagnosis not present

## 2019-12-19 DIAGNOSIS — Y838 Other surgical procedures as the cause of abnormal reaction of the patient, or of later complication, without mention of misadventure at the time of the procedure: Secondary | ICD-10-CM | POA: Diagnosis not present

## 2019-12-19 DIAGNOSIS — N281 Cyst of kidney, acquired: Secondary | ICD-10-CM | POA: Diagnosis not present

## 2019-12-19 DIAGNOSIS — N179 Acute kidney failure, unspecified: Secondary | ICD-10-CM | POA: Diagnosis not present

## 2019-12-19 DIAGNOSIS — E1165 Type 2 diabetes mellitus with hyperglycemia: Secondary | ICD-10-CM | POA: Diagnosis not present

## 2019-12-19 DIAGNOSIS — Z981 Arthrodesis status: Secondary | ICD-10-CM | POA: Diagnosis not present

## 2019-12-19 DIAGNOSIS — R339 Retention of urine, unspecified: Secondary | ICD-10-CM | POA: Diagnosis present

## 2019-12-19 DIAGNOSIS — Z20822 Contact with and (suspected) exposure to covid-19: Secondary | ICD-10-CM | POA: Diagnosis not present

## 2019-12-19 DIAGNOSIS — J9601 Acute respiratory failure with hypoxia: Secondary | ICD-10-CM | POA: Diagnosis not present

## 2019-12-19 DIAGNOSIS — M1990 Unspecified osteoarthritis, unspecified site: Secondary | ICD-10-CM | POA: Diagnosis not present

## 2019-12-19 LAB — SARS CORONAVIRUS 2 (TAT 6-24 HRS): SARS Coronavirus 2: NEGATIVE

## 2019-12-19 NOTE — Telephone Encounter (Signed)
° °  ° °  [facility] Pre-operative Risk Assessment    HEARTCARE STAFF: - Please ensure there is not already an duplicate clearance open for this procedure. - Under Visit Info/Reason for Call, type in Other and utilize the format Clearance MM/DD/YY or Clearance TBD. Do not use dashes or single digits.  Request for surgical clearance:  1. What type of surgery is being performed? lithotripsy  2. When is this surgery scheduled? 12/22/19  3. What type of clearance is required (medical clearance vs. Pharmacy clearance to hold med vs. Both)? medical  4. Are there any medications that need to be held prior to surgery and how long? Aspirin 72 hours  5. Practice name and name of physician performing surgery? Dr. Louis Meckel   6. What is the office phone number? [place]   7.   What is the office fax number? [phone]  8.   Anesthesia type (None, local, MAC, general) ? Local   Angeline S Hammer 12/19/2019, 3:01 PM  _________________________________________________________________   (provider comments below)

## 2019-12-19 NOTE — Progress Notes (Signed)
Patient to arrive at [facility] on 12/22/19.Instructed to stop Mobic, Fish oil and ASA. NPO after midnight 12/21/19. Lisinopril with sip of water in am. To hold Metformin morning of procedure.Patient had tela-health appointment with her cardiologist this week. Has not had an EKG in the past 6 months. Pre-procedure instructions given. Driver secured.

## 2019-12-22 ENCOUNTER — Ambulatory Visit (HOSPITAL_COMMUNITY): Payer: Medicare Other

## 2019-12-22 ENCOUNTER — Inpatient Hospital Stay (HOSPITAL_COMMUNITY)
Admission: EM | Admit: 2019-12-22 | Discharge: 2019-12-24 | DRG: 907 | Disposition: A | Discharge status: Home / Self Care | Payer: Medicare Other | Attending: Internal Medicine | Admitting: Internal Medicine

## 2019-12-22 ENCOUNTER — Encounter (HOSPITAL_BASED_OUTPATIENT_CLINIC_OR_DEPARTMENT_OTHER): Payer: Self-pay | Admitting: Urology

## 2019-12-22 ENCOUNTER — Ambulatory Visit (HOSPITAL_BASED_OUTPATIENT_CLINIC_OR_DEPARTMENT_OTHER)
Admission: RE | Admit: 2019-12-22 | Discharge: 2019-12-22 | Disposition: A | Discharge status: Home / Self Care | Payer: Medicare Other | Source: Home / Self Care | Attending: Urology | Admitting: Urology

## 2019-12-22 ENCOUNTER — Other Ambulatory Visit: Payer: Self-pay

## 2019-12-22 ENCOUNTER — Emergency Department (HOSPITAL_COMMUNITY): Payer: Medicare Other

## 2019-12-22 ENCOUNTER — Encounter (HOSPITAL_BASED_OUTPATIENT_CLINIC_OR_DEPARTMENT_OTHER)
Admission: RE | Disposition: A | Discharge status: Home / Self Care | Payer: Self-pay | Source: Home / Self Care | Attending: Urology

## 2019-12-22 DIAGNOSIS — N132 Hydronephrosis with renal and ureteral calculous obstruction: Secondary | ICD-10-CM | POA: Insufficient documentation

## 2019-12-22 DIAGNOSIS — J9601 Acute respiratory failure with hypoxia: Secondary | ICD-10-CM | POA: Diagnosis not present

## 2019-12-22 DIAGNOSIS — I2510 Atherosclerotic heart disease of native coronary artery without angina pectoris: Secondary | ICD-10-CM | POA: Diagnosis present

## 2019-12-22 DIAGNOSIS — M1990 Unspecified osteoarthritis, unspecified site: Secondary | ICD-10-CM | POA: Diagnosis present

## 2019-12-22 DIAGNOSIS — R338 Other retention of urine: Secondary | ICD-10-CM

## 2019-12-22 DIAGNOSIS — Z880 Allergy status to penicillin: Secondary | ICD-10-CM | POA: Diagnosis not present

## 2019-12-22 DIAGNOSIS — F329 Major depressive disorder, single episode, unspecified: Secondary | ICD-10-CM | POA: Diagnosis present

## 2019-12-22 DIAGNOSIS — Y838 Other surgical procedures as the cause of abnormal reaction of the patient, or of later complication, without mention of misadventure at the time of the procedure: Secondary | ICD-10-CM | POA: Diagnosis present

## 2019-12-22 DIAGNOSIS — Z7982 Long term (current) use of aspirin: Secondary | ICD-10-CM

## 2019-12-22 DIAGNOSIS — Z86010 Personal history of colonic polyps: Secondary | ICD-10-CM | POA: Diagnosis not present

## 2019-12-22 DIAGNOSIS — N179 Acute kidney failure, unspecified: Secondary | ICD-10-CM | POA: Diagnosis present

## 2019-12-22 DIAGNOSIS — N281 Cyst of kidney, acquired: Secondary | ICD-10-CM | POA: Diagnosis present

## 2019-12-22 DIAGNOSIS — R4020 Unspecified coma: Secondary | ICD-10-CM | POA: Diagnosis not present

## 2019-12-22 DIAGNOSIS — E669 Obesity, unspecified: Secondary | ICD-10-CM | POA: Diagnosis present

## 2019-12-22 DIAGNOSIS — I491 Atrial premature depolarization: Secondary | ICD-10-CM | POA: Diagnosis not present

## 2019-12-22 DIAGNOSIS — K5900 Constipation, unspecified: Secondary | ICD-10-CM | POA: Diagnosis not present

## 2019-12-22 DIAGNOSIS — N99840 Postprocedural hematoma of a genitourinary system organ or structure following a genitourinary system procedure: Secondary | ICD-10-CM | POA: Diagnosis present

## 2019-12-22 DIAGNOSIS — Z87442 Personal history of urinary calculi: Secondary | ICD-10-CM

## 2019-12-22 DIAGNOSIS — E785 Hyperlipidemia, unspecified: Secondary | ICD-10-CM | POA: Diagnosis present

## 2019-12-22 DIAGNOSIS — Z791 Long term (current) use of non-steroidal anti-inflammatories (NSAID): Secondary | ICD-10-CM | POA: Insufficient documentation

## 2019-12-22 DIAGNOSIS — K219 Gastro-esophageal reflux disease without esophagitis: Secondary | ICD-10-CM | POA: Insufficient documentation

## 2019-12-22 DIAGNOSIS — S37012A Minor contusion of left kidney, initial encounter: Secondary | ICD-10-CM | POA: Diagnosis not present

## 2019-12-22 DIAGNOSIS — R55 Syncope and collapse: Secondary | ICD-10-CM | POA: Diagnosis present

## 2019-12-22 DIAGNOSIS — Z79899 Other long term (current) drug therapy: Secondary | ICD-10-CM | POA: Insufficient documentation

## 2019-12-22 DIAGNOSIS — Z8719 Personal history of other diseases of the digestive system: Secondary | ICD-10-CM

## 2019-12-22 DIAGNOSIS — Z981 Arthrodesis status: Secondary | ICD-10-CM

## 2019-12-22 DIAGNOSIS — D509 Iron deficiency anemia, unspecified: Secondary | ICD-10-CM | POA: Diagnosis present

## 2019-12-22 DIAGNOSIS — Z885 Allergy status to narcotic agent status: Secondary | ICD-10-CM | POA: Insufficient documentation

## 2019-12-22 DIAGNOSIS — Z03818 Encounter for observation for suspected exposure to other biological agents ruled out: Secondary | ICD-10-CM | POA: Diagnosis not present

## 2019-12-22 DIAGNOSIS — Z888 Allergy status to other drugs, medicaments and biological substances status: Secondary | ICD-10-CM | POA: Insufficient documentation

## 2019-12-22 DIAGNOSIS — Z6837 Body mass index (BMI) 37.0-37.9, adult: Secondary | ICD-10-CM | POA: Diagnosis not present

## 2019-12-22 DIAGNOSIS — S37019A Minor contusion of unspecified kidney, initial encounter: Secondary | ICD-10-CM

## 2019-12-22 DIAGNOSIS — N201 Calculus of ureter: Secondary | ICD-10-CM | POA: Diagnosis not present

## 2019-12-22 DIAGNOSIS — Z8249 Family history of ischemic heart disease and other diseases of the circulatory system: Secondary | ICD-10-CM | POA: Insufficient documentation

## 2019-12-22 DIAGNOSIS — R339 Retention of urine, unspecified: Secondary | ICD-10-CM | POA: Diagnosis present

## 2019-12-22 DIAGNOSIS — G4733 Obstructive sleep apnea (adult) (pediatric): Secondary | ICD-10-CM | POA: Diagnosis not present

## 2019-12-22 DIAGNOSIS — E7800 Pure hypercholesterolemia, unspecified: Secondary | ICD-10-CM | POA: Diagnosis present

## 2019-12-22 DIAGNOSIS — E1165 Type 2 diabetes mellitus with hyperglycemia: Secondary | ICD-10-CM | POA: Diagnosis present

## 2019-12-22 DIAGNOSIS — N200 Calculus of kidney: Secondary | ICD-10-CM | POA: Diagnosis not present

## 2019-12-22 DIAGNOSIS — Z20822 Contact with and (suspected) exposure to covid-19: Secondary | ICD-10-CM | POA: Diagnosis present

## 2019-12-22 DIAGNOSIS — R0602 Shortness of breath: Secondary | ICD-10-CM

## 2019-12-22 DIAGNOSIS — R52 Pain, unspecified: Secondary | ICD-10-CM | POA: Diagnosis not present

## 2019-12-22 DIAGNOSIS — I10 Essential (primary) hypertension: Secondary | ICD-10-CM | POA: Diagnosis present

## 2019-12-22 DIAGNOSIS — Z7984 Long term (current) use of oral hypoglycemic drugs: Secondary | ICD-10-CM

## 2019-12-22 DIAGNOSIS — R0902 Hypoxemia: Secondary | ICD-10-CM | POA: Diagnosis not present

## 2019-12-22 DIAGNOSIS — G4730 Sleep apnea, unspecified: Secondary | ICD-10-CM | POA: Insufficient documentation

## 2019-12-22 DIAGNOSIS — Z841 Family history of disorders of kidney and ureter: Secondary | ICD-10-CM

## 2019-12-22 DIAGNOSIS — Z8379 Family history of other diseases of the digestive system: Secondary | ICD-10-CM

## 2019-12-22 DIAGNOSIS — R109 Unspecified abdominal pain: Secondary | ICD-10-CM | POA: Diagnosis not present

## 2019-12-22 HISTORY — PX: EXTRACORPOREAL SHOCK WAVE LITHOTRIPSY: SHX1557

## 2019-12-22 LAB — URINALYSIS, ROUTINE W REFLEX MICROSCOPIC
Bilirubin Urine: NEGATIVE
Glucose, UA: NEGATIVE mg/dL
Ketones, ur: NEGATIVE mg/dL
Leukocytes,Ua: NEGATIVE
Nitrite: NEGATIVE
Protein, ur: 100 mg/dL — AB
RBC / HPF: >50 RBC/hpf — ABNORMAL HIGH (ref 0–5)
Specific Gravity, Urine: 1.01 (ref 1.005–1.030)
pH: 6 (ref 5.0–8.0)

## 2019-12-22 LAB — CBC WITH DIFFERENTIAL/PLATELET
Abs Immature Granulocytes: 0.05 10*3/uL (ref 0.00–0.07)
Basophils Absolute: 0 10*3/uL (ref 0.0–0.1)
Basophils Relative: 0 %
Eosinophils Absolute: 0 10*3/uL (ref 0.0–0.5)
Eosinophils Relative: 0 %
HCT: 35.3 % — ABNORMAL LOW (ref 36.0–46.0)
Hemoglobin: 11.2 g/dL — ABNORMAL LOW (ref 12.0–15.0)
Immature Granulocytes: 1 %
Lymphocytes Relative: 5 %
Lymphs Abs: 0.5 10*3/uL — ABNORMAL LOW (ref 0.7–4.0)
MCH: 28.3 pg (ref 26.0–34.0)
MCHC: 31.7 g/dL (ref 30.0–36.0)
MCV: 89.1 fL (ref 80.0–100.0)
Monocytes Absolute: 0.4 10*3/uL (ref 0.1–1.0)
Monocytes Relative: 5 %
Neutro Abs: 8 10*3/uL — ABNORMAL HIGH (ref 1.7–7.7)
Neutrophils Relative %: 89 %
Platelets: 188 10*3/uL (ref 150–400)
RBC: 3.96 MIL/uL (ref 3.87–5.11)
RDW: 14.7 % (ref 11.5–15.5)
WBC: 8.9 10*3/uL (ref 4.0–10.5)
nRBC: 0 % (ref 0.0–0.2)

## 2019-12-22 LAB — COMPREHENSIVE METABOLIC PANEL
ALT: 25 U/L (ref 0–44)
AST: 20 U/L (ref 15–41)
Albumin: 4 g/dL (ref 3.5–5.0)
Alkaline Phosphatase: 68 U/L (ref 38–126)
Anion gap: 10 (ref 5–15)
BUN: 19 mg/dL (ref 8–23)
CO2: 27 mmol/L (ref 22–32)
Calcium: 9.8 mg/dL (ref 8.9–10.3)
Chloride: 103 mmol/L (ref 98–111)
Creatinine, Ser: 1.02 mg/dL — ABNORMAL HIGH (ref 0.44–1.00)
GFR calc Af Amer: >60 mL/min (ref >60)
GFR calc non Af Amer: 56 mL/min — ABNORMAL LOW (ref >60)
Glucose, Bld: 145 mg/dL — ABNORMAL HIGH (ref 70–99)
Potassium: 3.9 mmol/L (ref 3.5–5.1)
Sodium: 140 mmol/L (ref 135–145)
Total Bilirubin: 0.3 mg/dL (ref 0.3–1.2)
Total Protein: 6.8 g/dL (ref 6.5–8.1)

## 2019-12-22 LAB — GLUCOSE, CAPILLARY
Glucose-Capillary: 105 mg/dL — ABNORMAL HIGH (ref 70–99)
Glucose-Capillary: 83 mg/dL (ref 70–99)

## 2019-12-22 SURGERY — LITHOTRIPSY, ESWL
Anesthesia: LOCAL | Laterality: Left

## 2019-12-22 MED ORDER — LISINOPRIL 20 MG PO TABS
20.0000 mg | ORAL_TABLET | Freq: Every day | ORAL | Status: DC
  Administered 2019-12-23 – 2019-12-24 (×2): 20 mg via ORAL
  Filled 2019-12-22 (×2): qty 1

## 2019-12-22 MED ORDER — CLONAZEPAM 0.5 MG PO TABS
0.5000 mg | ORAL_TABLET | Freq: Every day | ORAL | Status: DC
  Filled 2019-12-22: qty 1

## 2019-12-22 MED ORDER — TRAMADOL HCL 50 MG PO TABS: 50.0000 mg | ORAL_TABLET | Freq: Four times a day (QID) | ORAL | 0 refills | Status: DC | PRN

## 2019-12-22 MED ORDER — DIPHENHYDRAMINE HCL 25 MG PO CAPS
ORAL_CAPSULE | ORAL | Status: AC
  Filled 2019-12-22: qty 1

## 2019-12-22 MED ORDER — TAMSULOSIN HCL 0.4 MG PO CAPS
0.4000 mg | ORAL_CAPSULE | Freq: Every day | ORAL | Status: DC
  Administered 2019-12-23 – 2019-12-24 (×2): 0.4 mg via ORAL
  Filled 2019-12-22 (×2): qty 1

## 2019-12-22 MED ORDER — CIPROFLOXACIN HCL 500 MG PO TABS
500.0000 mg | ORAL_TABLET | ORAL | Status: AC
  Administered 2019-12-22: 500 mg via ORAL

## 2019-12-22 MED ORDER — FENTANYL CITRATE (PF) 100 MCG/2ML IJ SOLN
50.0000 ug | Freq: Once | INTRAMUSCULAR | Status: AC
  Administered 2019-12-22: 50 ug via INTRAVENOUS
  Filled 2019-12-22: qty 2

## 2019-12-22 MED ORDER — DIAZEPAM 5 MG PO TABS
ORAL_TABLET | ORAL | Status: AC
  Filled 2019-12-22: qty 2

## 2019-12-22 MED ORDER — ATORVASTATIN CALCIUM 40 MG PO TABS
40.0000 mg | ORAL_TABLET | Freq: Every day | ORAL | Status: DC
  Administered 2019-12-23 – 2019-12-24 (×2): 40 mg via ORAL
  Filled 2019-12-22 (×2): qty 1

## 2019-12-22 MED ORDER — DIPHENHYDRAMINE HCL 50 MG/ML IJ SOLN
12.5000 mg | Freq: Once | INTRAMUSCULAR | Status: AC
  Administered 2019-12-22: 12.5 mg via INTRAVENOUS
  Filled 2019-12-22: qty 1

## 2019-12-22 MED ORDER — CALCIUM CARBONATE ANTACID 500 MG PO CHEW
1.0000 | CHEWABLE_TABLET | Freq: Every day | ORAL | Status: DC
  Administered 2019-12-23 – 2019-12-24 (×2): 200 mg via ORAL
  Filled 2019-12-22 (×2): qty 1

## 2019-12-22 MED ORDER — MORPHINE SULFATE (PF) 2 MG/ML IV SOLN: 2.0000 mg | INTRAVENOUS | Status: DC | PRN

## 2019-12-22 MED ORDER — DIPHENHYDRAMINE HCL 25 MG PO CAPS
25.0000 mg | ORAL_CAPSULE | ORAL | Status: AC
  Administered 2019-12-22: 25 mg via ORAL

## 2019-12-22 MED ORDER — ASPIRIN EC 81 MG PO TBEC
81.0000 mg | DELAYED_RELEASE_TABLET | Freq: Every day | ORAL | Status: DC
  Administered 2019-12-23 – 2019-12-24 (×2): 81 mg via ORAL
  Filled 2019-12-22 (×2): qty 1

## 2019-12-22 MED ORDER — TRAZODONE HCL 100 MG PO TABS
100.0000 mg | ORAL_TABLET | Freq: Every day | ORAL | Status: DC
  Administered 2019-12-22 – 2019-12-23 (×2): 100 mg via ORAL
  Filled 2019-12-22 (×3): qty 1

## 2019-12-22 MED ORDER — ACETAMINOPHEN 325 MG PO TABS: 650.0000 mg | ORAL_TABLET | Freq: Four times a day (QID) | ORAL | Status: DC | PRN

## 2019-12-22 MED ORDER — DIAZEPAM 5 MG PO TABS
10.0000 mg | ORAL_TABLET | ORAL | Status: AC
  Administered 2019-12-22: 10 mg via ORAL

## 2019-12-22 MED ORDER — ONDANSETRON HCL 4 MG/2ML IJ SOLN
4.0000 mg | Freq: Four times a day (QID) | INTRAMUSCULAR | Status: DC | PRN
  Administered 2019-12-22: 4 mg via INTRAVENOUS
  Filled 2019-12-22: qty 2

## 2019-12-22 MED ORDER — EZETIMIBE 10 MG PO TABS
10.0000 mg | ORAL_TABLET | Freq: Every day | ORAL | Status: DC
  Administered 2019-12-23 – 2019-12-24 (×2): 10 mg via ORAL
  Filled 2019-12-22 (×2): qty 1

## 2019-12-22 MED ORDER — HYDROMORPHONE HCL 2 MG PO TABS: 2.0000 mg | ORAL_TABLET | Freq: Four times a day (QID) | ORAL | Status: DC | PRN

## 2019-12-22 MED ORDER — CIPROFLOXACIN HCL 500 MG PO TABS
ORAL_TABLET | ORAL | Status: AC
  Filled 2019-12-22: qty 1

## 2019-12-22 MED ORDER — TRAMADOL HCL 50 MG PO TABS
100.0000 mg | ORAL_TABLET | Freq: Once | ORAL | Status: AC
  Administered 2019-12-22: 100 mg via ORAL

## 2019-12-22 MED ORDER — VENLAFAXINE HCL ER 75 MG PO CP24
75.0000 mg | ORAL_CAPSULE | Freq: Every day | ORAL | Status: DC
  Administered 2019-12-23 – 2019-12-24 (×2): 75 mg via ORAL
  Filled 2019-12-22 (×2): qty 1

## 2019-12-22 MED ORDER — PROMETHAZINE HCL 25 MG/ML IJ SOLN
12.5000 mg | Freq: Four times a day (QID) | INTRAMUSCULAR | Status: DC | PRN
  Administered 2019-12-23: 12.5 mg via INTRAVENOUS
  Filled 2019-12-22: qty 1

## 2019-12-22 MED ORDER — SODIUM CHLORIDE 0.9 % IV SOLN: INTRAVENOUS | Status: DC

## 2019-12-22 MED ORDER — SODIUM CHLORIDE 0.9 % IV BOLUS
1000.0000 mL | Freq: Once | INTRAVENOUS | Status: AC
  Administered 2019-12-22: 1000 mL via INTRAVENOUS

## 2019-12-22 MED ORDER — TRAMADOL HCL 50 MG PO TABS
ORAL_TABLET | ORAL | Status: AC
  Filled 2019-12-22: qty 2

## 2019-12-22 NOTE — H&P (Addendum)
History and Physical    [name] [id number] DOB: 1951/05/31 DOA: 12/22/2019  PCP: Kathyrn Lass, MD  Patient coming from: Home  I have personally briefly reviewed patient's old medical records in [place]  Chief Complaint: syncope, urinary retention   HPI: [name] is a 69 y.o. female with medical history significant for CAD, hypertension, hyperlipidemia, OSA on CPAP, morbid obesity, nephrolithiasis who presents with concerns of syncope and urinary retention.  Patient had an elective outpatient ESWL today for a left renal stone with urology.  Upon returning home she reports feeling much worse.  Had worsening pain to her left lower back and left lower quadrant abdomen.  She had taken docusate yesterday and today had 2 bowel movements but has not had urine output since 7 AM this morning.  She also felt nauseous today and try to get to the bathroom but had loss of consciousness and woke up on her back.  Fall was unwitnessed but sister heard her fall and another roommate came to check on her.  She initially awoke and was confused.  She reports that later in the day she again felt nauseous while in bed and passed out in the bed.  Both times she has felt very dizzy suddenly.  Patient reports that she has had syncope associated with her nausea many years ago and has had a work-up with no findings.  She denies any chest pain or shortness of breath with her syncope.  No fevers.  Labs notable no leukocytosis, mild anemia with Hgb of 11.2. Creatinine of 1.02.   CT renal stone search shows interval development of mild to moderate size left renal subcapsular hematoma and 72mm calculus in left renal pelvis.   ED physician Dr. Roslynn Amble spoke with urology and no emergent procedures at this time. They recommend repeat Hgb in the morning and they will see her in consultation in the morning   She was given 1L of NS fluid in ED.     Review of Systems: Constitutional: No Weight  Change, No Fever ENT/Mouth: No sore throat, No Rhinorrhea Eyes: No Eye Pain, No Vision Changes Cardiovascular: No Chest Pain, no SOB Respiratory: No Cough, No Sputum, No Wheezing, no Dyspnea  Gastrointestinal: + Nausea, + Vomiting, No Diarrhea, No Constipation, + Pain Genitourinary: no Urinary Incontinence, No Urgency, + Flank Pain Musculoskeletal: No Arthralgias, No Myalgias Skin: No Skin Lesions, No Pruritus, Neuro: no Weakness, No Numbness,  +Loss of Consciousness, + Syncope Psych: No Anxiety/Panic, No Depression, no decrease appetite Heme/Lymph: No Bruising, No Bleeding  Past Medical History:  Diagnosis Date  . Arthritis   . Chronic fatigue    and weakness  . Colon polyps   . Coronary artery disease    nonobstructive with 20% OM by cath 2006  . Depression   . Diabetes mellitus without complication ([place])   . Diastolic dysfunction    w Elevated LVEDP  . DJD (degenerative joint disease)    C3/4 Dr Sherwood Gambler  . GERD (gastroesophageal reflux disease)   . H/O hiatal hernia   . High cholesterol   . Hypertension   . Hypoglycemia   . Iron deficiency anemia   . Kidney stones   . Multiple thyroid nodules   . Neurocardiogenic syncope   . Obesity   . Prediabetes 2011 & 2012  . Right knee pain 12/2009   Dr Marlou Sa  . Sleep apnea    CPAP had Sleep Study ordered by Dr. Radford Pax  . Tick bite 11/2016  .  Vasovagal syncope     Past Surgical History:  Procedure Laterality Date  . ACHILLES TENDON SURGERY Left 01/23/2013   Procedure: EXCISION PARTIAL BONE TALUS/CALCANEUS, REPAIR RUPTURE ACHILLES TENDON PRIMARY OPEN ;  Surgeon: Ninetta Lights, MD;  Location: [facility];  Service: Orthopedics;  Laterality: Left;  . ANTERIOR FUSION CERVICAL SPINE     x2-cervical  . BREAST BIOPSY Right 2007  . BREAST CYST EXCISION Left   . CARDIAC CATHETERIZATION  ~ 2008   Nonobstructive ASCAD, 20% OM  . CARPAL TUNNEL RELEASE Left   . LITHOTRIPSY    . OTHER SURGICAL HISTORY  06/12/2018    Left big toe nail surgery. [facility]   . TONSILLECTOMY    . TOTAL KNEE ARTHROPLASTY Right 11/2010  . TOTAL KNEE ARTHROPLASTY  12/13/2011   Procedure: TOTAL KNEE ARTHROPLASTY;  Surgeon: Ninetta Lights, MD;  Location: [facility];  Service: Orthopedics;  Laterality: Left;  DR MURPHY WANTS 90 MINUTES FOR THIS CASE     reports that she has never smoked. She has never used smokeless tobacco. She reports current alcohol use of about 6.0 standard drinks of alcohol per week. She reports that she does not use drugs.  Allergies  Allergen Reactions  . Guaifenesin & Derivatives Itching  . Morphine And Related Itching    Hydrocodone and oxycodone  . Penicillins Itching  . Tramadol Itching  . Hydrocodone-Acetaminophen Itching  . Oxycodone Hcl Itching    Family History  Problem Relation Age of Onset  . CAD Father   . Cancer - Prostate Father   . Heart disease Father   . Diverticulosis Father   . Hypertension Sister   . Diverticulosis Sister   . Breast cancer Sister   . CVA Mother   . Diverticulosis Mother   . Kidney disease Mother   . Heart disease Paternal Grandfather   . Crohn's disease Paternal Aunt   . Cancer Paternal Aunt        x 2, type unknown  . Colon cancer Other        maternal great Aunt     Prior to Admission medications   Medication Sig Start Date End Date Taking? Authorizing Provider  aspirin 81 MG tablet Take 81 mg by mouth daily.   Yes [provider]  atorvastatin (LIPITOR) 40 MG tablet Take 1 tablet (40 mg total) by mouth daily. 04/18/19  Yes Turner, Eber Hong, MD  CALCIUM PO Take 800 mg by mouth daily.   Yes [provider]  Cholecalciferol (VITAMIN D3 PO) Take 2,000 Units by mouth daily.   Yes [provider]  clonazePAM (KLONOPIN) 0.5 MG tablet Take 0.5 mg by mouth daily.  06/04/18  Yes [provider]  desvenlafaxine (PRISTIQ) 50 MG 24 hr tablet Take 50 mg by mouth daily.   Yes [provider]  esomeprazole  (NEXIUM) 40 MG capsule Take 1 capsule (40 mg total) by mouth 2 (two) times daily before a meal. Patient taking differently: Take 40 mg by mouth as needed.  01/15/17  Yes Armbruster, Carlota Raspberry, MD  ezetimibe (ZETIA) 10 MG tablet Take 1 tablet (10 mg total) by mouth daily. 04/18/19  Yes Turner, Eber Hong, MD  FERROUS SULFATE PO Take 65 mg by mouth daily.   Yes [provider]  HYDROmorphone (DILAUDID) 2 MG tablet Take 2 mg by mouth every 6 (six) hours as needed. 12/19/19  Yes [provider]  ibuprofen (ADVIL,MOTRIN) 200 MG tablet Take 400 mg by  mouth as needed (pain).    Yes [provider]  lisinopril (ZESTRIL) 20 MG tablet Take 20 mg by mouth daily.  10/24/19  Yes [provider]  meloxicam (MOBIC) 15 MG tablet Take 15 mg by mouth as needed.  05/30/18  Yes [provider]  metFORMIN (GLUCOPHAGE) 500 MG tablet Take 500 mg by mouth 2 (two) times daily with a meal.    Yes [provider]  Multiple Vitamins-Minerals (PRESERVISION AREDS 2 PO) Take 1 capsule by mouth daily.   Yes [provider]  NONFORMULARY OR COMPOUNDED ITEM Apply topically 2 (two) times daily. Salicylic Acid [id number], Urea [id number] Cream, apply to the affected area once or twice daily   Yes Bronson Ing, DPM  Omega-3 Fatty Acids (FISH OIL) 1200 MG CAPS Take 3 capsules by mouth daily.   Yes [provider]  tamsulosin (FLOMAX) 0.4 MG CAPS capsule Take 0.4 mg by mouth daily. 12/19/19  Yes [provider]  traZODone (DESYREL) 50 MG tablet Take 100 mg by mouth at bedtime.    Yes [provider]  ammonium lactate (LAC-HYDRIN) 12 % cream Apply topically as needed for dry skin. Patient not taking: Reported on 12/22/2019 01/10/19   Gardiner Barefoot, DPM  traMADol (ULTRAM) 50 MG tablet Take 1-2 tablets (50-100 mg total) by mouth every 6 (six) hours as needed for moderate pain. Patient not taking: Reported on 12/22/2019 12/22/19   Ardis Hughs, MD    Physical  Exam: Vitals:   12/22/19 1945 12/22/19 2000 12/22/19 2010 12/22/19 2015  BP: 125/70 129/67 129/67 125/75  Pulse: 79 87 81 82  Resp:   18   Temp:      TempSrc:      SpO2: 94% 100% 98% 92%  Weight:      Height:        Constitutional: NAD, calm, comfortable, nontoxic well-appearing obese female sitting upright in bed Vitals:   12/22/19 1945 12/22/19 2000 12/22/19 2010 12/22/19 2015  BP: 125/70 129/67 129/67 125/75  Pulse: 79 87 81 82  Resp:   18   Temp:      TempSrc:      SpO2: 94% 100% 98% 92%  Weight:      Height:       Eyes: PERRL, lids and conjunctivae normal ENMT: Mucous membranes are moist.  Neck: normal, supple Respiratory: clear to auscultation bilaterally, no wheezing, no crackles. Normal respiratory effort on room air. No accessory muscle use.  Cardiovascular: Regular rate and rhythm, no murmurs / rubs / gallops. No extremity edema. 2+ pedal pulses.  Abdomen: no tenderness, no masses palpated.Bowel sounds positive.  No CVA tenderness. GU: Pure wick catheter in place with no urine output Musculoskeletal: no clubbing / cyanosis. No joint deformity upper and lower extremities. Good ROM, no contractures. Normal muscle tone.  Skin: no rashes, lesions, ulcers. No induration Neurologic: CN 2-12 grossly intact. Sensation intact,Strength 5/5 in all 4.  Psychiatric: Normal judgment and insight. Alert and oriented x 3. Normal mood.    Labs on Admission: I have personally reviewed following labs and imaging studies  CBC: Recent Labs  Lab 12/22/19 1845  WBC 8.9  NEUTROABS 8.0*  HGB 11.2*  HCT 35.3*  MCV 89.1  PLT 0000000   Basic Metabolic Panel: Recent Labs  Lab 12/22/19 1845  NA 140  K 3.9  CL 103  CO2 27  GLUCOSE 145*  BUN 19  CREATININE 1.02*  CALCIUM 9.8   GFR: Estimated Creatinine Clearance: 63.1 mL/min (  A) (by C-G formula based on SCr of 1.02 mg/dL (H)). Liver Function Tests: Recent Labs  Lab 12/18/19 1056 12/22/19 1845  AST  --  20  ALT 23 25   ALKPHOS  --  68  BILITOT  --  0.3  PROT  --  6.8  ALBUMIN  --  4.0   No results for input(s): LIPASE, AMYLASE in the last 168 hours. No results for input(s): AMMONIA in the last 168 hours. Coagulation Profile: No results for input(s): INR, PROTIME in the last 168 hours. Cardiac Enzymes: No results for input(s): CKTOTAL, CKMB, CKMBINDEX, TROPONINI in the last 168 hours. BNP (last 3 results) No results for input(s): PROBNP in the last 8760 hours. HbA1C: No results for input(s): HGBA1C in the last 72 hours. CBG: Recent Labs  Lab 12/22/19 0832 12/22/19 1132  GLUCAP 105* 83   Lipid Profile: No results for input(s): CHOL, HDL, LDLCALC, TRIG, CHOLHDL, LDLDIRECT in the last 72 hours. Thyroid Function Tests: No results for input(s): TSH, T4TOTAL, FREET4, T3FREE, THYROIDAB in the last 72 hours. Anemia Panel: No results for input(s): VITAMINB12, FOLATE, FERRITIN, TIBC, IRON, RETICCTPCT in the last 72 hours. Urine analysis:    Component Value Date/Time   COLORURINE YELLOW 12/06/2011 1346   APPEARANCEUR CLEAR 12/06/2011 1346   LABSPEC 1.022 12/06/2011 1346   PHURINE 7.0 12/06/2011 1346   GLUCOSEU NEGATIVE 12/06/2011 1346   HGBUR NEGATIVE 12/06/2011 1346   BILIRUBINUR NEGATIVE 12/06/2011 1346   KETONESUR NEGATIVE 12/06/2011 1346   PROTEINUR NEGATIVE 12/06/2011 1346   UROBILINOGEN 0.2 12/06/2011 1346   NITRITE NEGATIVE 12/06/2011 1346   LEUKOCYTESUR SMALL (A) 12/06/2011 1346    Radiological Exams on Admission: DG Abd 1 View  Result Date: 12/22/2019 CLINICAL DATA:  Preop evaluation for upcoming lithotripsy EXAM: ABDOMEN - 1 VIEW COMPARISON:  12/17/2019 FINDINGS: Scattered large and small bowel gas is noted. Mild retained fecal material is seen. Stable left renal calculi are noted most prominent in the renal pelvis again measuring approximately 16 mm in greatest dimension. Degenerative changes of lumbar spine are seen. IMPRESSION: Mild constipation. Stable left renal calculi similar  to that seen on prior CT. Electronically Signed   By: Inez Catalina M.D.   On: 12/22/2019 07:57   CT Renal Stone Study  Result Date: 12/22/2019 CLINICAL DATA:  Left flank pain status post lithotripsy today. EXAM: CT ABDOMEN AND PELVIS WITHOUT CONTRAST TECHNIQUE: Multidetector CT imaging of the abdomen and pelvis was performed following the standard protocol without IV contrast. COMPARISON:  Dec 17, 2019. FINDINGS: Lower chest: No acute abnormality. Hepatobiliary: No focal liver abnormality is seen. No gallstones, gallbladder wall thickening, or biliary dilatation. Pancreas: Unremarkable. No pancreatic ductal dilatation or surrounding inflammatory changes. Spleen: Normal in size without focal abnormality. Adrenals/Urinary Tract: Adrenal glands appear normal. Stable large simple cyst is seen arising from upper pole of right kidney. Right kidney and ureter are unremarkable. Urinary bladder is unremarkable. Left nephrolithiasis is noted, including 15 mm calculus in left renal pelvis. No significant hydronephrosis is noted. However, there is the interval development of mild to moderate size left renal subcapsular hematoma. Stomach/Bowel: Stomach is within normal limits. Appendix appears normal. No evidence of bowel wall thickening, distention, or inflammatory changes. Vascular/Lymphatic: Aortic atherosclerosis. No enlarged abdominal or pelvic lymph nodes. Reproductive: Uterus and bilateral adnexa are unremarkable. Other: No abdominal wall hernia or abnormality. No abdominopelvic ascites. Musculoskeletal: No acute or significant osseous findings. IMPRESSION: 1. Interval development of mild to moderate size left renal subcapsular hematoma. 2. Left nephrolithiasis,  including 15 mm calculus in left renal pelvis. No significant hydronephrosis is noted. Aortic Atherosclerosis (ICD10-I70.0). Electronically Signed   By: Marijo Conception M.D.   On: 12/22/2019 20:17      Assessment/PlaN  Post ESWL complication/left renal  subcapsular hematoma POD 0 of elective ESWL with urology today Urology advised monitoring hemoglobin which is stable at this time.  They will consult in the morning.  Will repeat CBC in the morning Continue as needed p.o. Dilaudid, IV morphine for severe pain  Nephrolithiasis 58mm calculus remains in left renal pelvis without obstruction Continue PRN pain management Urology to consult  Syncope Nausea.  Likely vasovagal Will monitor on telemetry.  Has received 1 L of fluid in the ED.  Urinary retention Bladder scan showing 66 mL and was placed on pure wick.  However continues to not be able to urinate.  Will repeat bladder scan and pending results will place Foley Continue Flomax  OSA Continue CPAP  History of CAD Aspirin  Hypertension Continue lisinopril  Hyperlipidemia Continue statin, Zetia  Morbid obesity BMI of greater than 36  DVT prophylaxis:.SCDs Code Status: Full Family Communication: Plan discussed with patient at bedside  disposition Plan: Home with at least 2 midnight stays  Consults called: Urology to consult the morning Admission status: inpatient Status is: Inpatient  Remains inpatient appropriate because:Inpatient level of care appropriate due to severity of illness   Dispo: The patient is from: Home              Anticipated d/c is to: Home              Anticipated d/c date is: 2 days              Patient currently is not medically stable to d/c.          Orene Desanctis DO [facility]   If 7PM-7AM, please contact night-coverage [email or web address]   12/22/2019, 10:04 PM

## 2019-12-22 NOTE — ED Notes (Signed)
Pt is unable to provide urine sample at this time. 

## 2019-12-22 NOTE — ED Triage Notes (Signed)
69 yo female from home s/p Lithotripsy for left KS today at [place] am in the [facility]. Pt sts unable to urinate since procedure. Last voided at 0800 this morning. Also c/o nausea. S/P two syncopal episodes today, each time before vomitting. 1st time was from a standing position in the BR. Pt woke up on floor. Denies head injury. 2nd time was in bed. Arrives A/Ox3.   Received Zofran 4 mg IV en route and Fentanyl 50 mcg  IV en route with effect.

## 2019-12-22 NOTE — ED Notes (Signed)
Bladder scanner revealed 66 ml of urine. Pt sts she has no urge to urinate.

## 2019-12-22 NOTE — Op Note (Signed)
See [facility] OP note scanned into chart. Also because of the size, density, location and other factors that cannot be anticipated I feel this will likely be a staged procedure. This fact supersedes any indication in the scanned [facility] operative note to the contrary.  

## 2019-12-22 NOTE — Discharge Instructions (Signed)
See [facility] discharge instructions in chart. ° ° °Lithotripsy, Care After °This sheet gives you information about how to care for yourself after your procedure. Your health care provider may also give you more specific instructions. If you have problems or questions, contact your health care provider. °What can I expect after the procedure? °After the procedure, it is common to have: °· Some blood in your urine. This should only last for a few days. °· Soreness in your back, sides, or upper abdomen for a few days. °· Blotches or bruises on your back where the pressure wave entered the skin. °· Pain, discomfort, or nausea when pieces (fragments) of the kidney stone move through the tube that carries urine from the kidney to the bladder (ureter). Stone fragments may pass soon after the procedure, but they may continue to pass for up to 4-8 weeks. °? If you have severe pain or nausea, contact your health care provider. This may be caused by a large stone that was not broken up, and this may mean that you need more treatment. °· Some pain or discomfort during urination. °· Some pain or discomfort in the lower abdomen or (in men) at the base of the penis. °Follow these instructions at home: °Medicines °· Take over-the-counter and prescription medicines only as told by your health care provider. °· If you were prescribed an antibiotic medicine, take it as told by your health care provider. Do not stop taking the antibiotic even if you start to feel better. °· Do not drive for 24 hours if you were given a medicine to help you relax (sedative). °· Do not drive or use heavy machinery while taking prescription pain medicine. °Eating and drinking ° °  ° °· Drink enough water and fluids to keep your urine clear or pale yellow. This helps any remaining pieces of the stone to pass. It can also help prevent new stones from forming. °· Eat plenty of fresh fruits and vegetables. °· Follow instructions from your health  care provider about eating and drinking restrictions. You may be instructed: °? To reduce how much salt (sodium) you eat or drink. Check ingredients and nutrition facts on packaged foods and beverages. °? To reduce how much meat you eat. °· Eat the recommended amount of calcium for your age and gender. Ask your health care provider how much calcium you should have. °General instructions °· Get plenty of rest. °· Most people can resume normal activities 1-2 days after the procedure. Ask your health care provider what activities are safe for you. °· Your health care provider may direct you to lie in a certain position (postural drainage) and tap firmly (percuss) over your kidney area to help stone fragments pass. Follow instructions as told by your health care provider. °· If directed, strain all urine through the strainer that was provided by your health care provider. °? Keep all fragments for your health care provider to see. Any stones that are found may be sent to a medical lab for examination. The stone may be as small as a grain of salt. °· Keep all follow-up visits as told by your health care provider. This is important. °Contact a health care provider if: °· You have pain that is severe or does not get better with medicine. °· You have nausea that is severe or does not go away. °· You have blood in your urine longer than your health care provider told you to expect. °· You have more blood in   your urine. °· You have pain during urination that does not go away. °· You urinate more frequently than usual and this does not go away. °· You develop a rash or any other possible signs of an allergic reaction. °Get help right away if: °· You have severe pain in your back, sides, or upper abdomen. °· You have severe pain while urinating. °· Your urine is very dark red. °· You have blood in your stool (feces). °· You cannot pass any urine at all. °· You feel a strong urge to urinate after emptying your bladder. °· You  have a fever or chills. °· You develop shortness of breath, difficulty breathing, or chest pain. °· You have severe nausea that leads to persistent vomiting. °· You faint. °Summary °· After this procedure, it is common to have some pain, discomfort, or nausea when pieces (fragments) of the kidney stone move through the tube that carries urine from the kidney to the bladder (ureter). If this pain or nausea is severe, however, you should contact your health care provider. °· Most people can resume normal activities 1-2 days after the procedure. Ask your health care provider what activities are safe for you. °· Drink enough water and fluids to keep your urine clear or pale yellow. This helps any remaining pieces of the stone to pass, and it can help prevent new stones from forming. °· If directed, strain your urine and keep all fragments for your health care provider to see. Fragments or stones may be as small as a grain of salt. °· Get help right away if you have severe pain in your back, sides, or upper abdomen or have severe pain while urinating. °This information is not intended to replace advice given to you by your health care provider. Make sure you discuss any questions you have with your health care provider. °Document Revised: 10/28/2018 Document Reviewed: 06/07/2016 °Elsevier Patient Education © 2020 [employer] ° ° °

## 2019-12-22 NOTE — H&P (Signed)
I have kidney stones.  HPI: [name] is a 69 year-old female patient who was referred by Dr. Kathyrn Lass, MD who is here for renal calculi.  The problem is on the left side. This is not her first kidney stone.   [name] is a former patient with a history of stones who is sent back in consultation by Dr. Kathyrn Lass for a 1.4cm left renal pelvic stone found on recent CT for evaluation of gross hematuria that had been intermittent for a couple of months. She has intermittent LLQ pain. She last saw [name] in 2014 after passing a ureteral stone. She has had lithotripsy in the past. She has a 7cm right renal cyst and a few smaller stones in the left kidney. She has no history of UTI's.      ALLERGIES: Demerol TABS GuaiFENesin AC SYRP Hydrocodone CP SYRP Morphine Derivatives OxyCODONE HCl TABS Penicillin Tramadol    MEDICATIONS: Aleve  Aspirin 81 MG TABS Oral  Atorvastatin Calcium  Calcium Citrate TABS Oral  Clonazepam  Fish Oil CAPS Oral  Iron  Lisinopril 5 MG Oral Tablet Oral  Meloxicam  NexIUM 40 MG Oral Capsule Delayed Release Oral  Pristiq 50 MG Oral Tablet Extended Release 24 Hour Oral  Trazodone Hcl  Zetia 10 MG Oral Tablet Oral     GU PSH: None     PSH Notes: Primary Repair Of Ruptured Achilles Tendon, Neuroplasty Decompression Median Nerve At Carpal Tunnel, Breast Surgery Lumpectomy, Back Surgery, Tonsillectomy, Knee Replacement   NON-GU PSH: Anesth, Catheterize Heart Carpal tunnel surgery, Left Carpal Tunnel Surgery.. - 2014 Knee Arthroscopy, Right Remove Breast Lesion - 2014 Remove Tonsils - 2014 Repair Achilles Tendon - 2014 Revise Knee Joint - 2014 Tonsillectomy..     GU PMH: Gross hematuria, Gross hematuria - 2014 Hematuria, Unspec, Blood in urine - 2014 Nocturia, Nocturia - 2014 Renal calculus, Nephrolithiasis - 2014      PMH Notes:  1898-07-31 00:00:00 - Note: Normal Routine History And Physical Adult  2013-03-06 10:30:11 - Note: Arthritis  Pre  diabetes    NON-GU PMH: Personal history of other diseases of the circulatory system, History of cardiac disorder - 2014, History of hypertension, - 2014 Personal history of other diseases of the digestive system, History of esophageal reflux - 2014 Personal history of other diseases of the nervous system and sense organs, History of sleep apnea - 2014 Personal history of other endocrine, nutritional and metabolic disease, History of hypercholesterolemia - 2014 Personal history of other mental and behavioral disorders, History of depression - 2014 Arthritis Coronary Artery Disease Depression Diabetes Type 2 GERD Heart disease, unspecified Hypercholesterolemia Hypertension Personal history of colonic polyps Sleep Apnea    FAMILY HISTORY: Death In The Family Father - Runs In Family Death In The Family Mother - Runs In Family Diverticulosis Of Intestine - Mother Heart Disease - Father renal failure - Mother   SOCIAL HISTORY: Marital Status: Single Preferred Language: English; Race: White Current Smoking Status: Patient has never smoked.   Tobacco Use Assessment Completed: Used Tobacco in last 30 days? Drinks 1 caffeinated drink per day.     Notes: Never A Smoker, Marital History - Single, Caffeine Use, Alcohol Use, Occupation: Retired   REVIEW OF SYSTEMS:    GU Review Female:   Patient denies get up at night to urinate, frequent urination, being pregnant, hard to postpone urination, leakage of urine, trouble starting your stream, burning /pain with urination, stream starts and stops, and have to strain to urinate.  Gastrointestinal (Upper):   Patient denies nausea, vomiting, and indigestion/ heartburn.  Gastrointestinal (Lower):   Patient reports constipation. Patient denies diarrhea.  Constitutional:   Patient denies fever, night sweats, weight loss, and fatigue.  Skin:   Patient denies skin rash/ lesion and itching.  Eyes:   Patient denies blurred vision and double vision.   Ears/ Nose/ Throat:   Patient denies sore throat and sinus problems.  Hematologic/Lymphatic:   Patient reports easy bruising. Patient denies swollen glands.  Cardiovascular:   Patient denies leg swelling and chest pains.  Respiratory:   Patient denies cough and shortness of breath.  Endocrine:   Patient denies excessive thirst.  Musculoskeletal:   Patient reports back pain. Patient denies joint pain.  Neurological:   Patient denies headaches.  Psychologic:   Patient reports depression. Patient denies anxiety.   Notes: hematuria    VITAL SIGNS:      12/19/2019 11:11 AM  Weight 225 lb / 102.06 kg  Height 66 in / 167.64 cm  BP 123/63 mmHg  Heart Rate 85 /min  Temperature 97.8 F / 36.5 C  BMI 36.3 kg/m   GU PHYSICAL EXAMINATION:    Breast: Symmetrical. No tenderness, no nipple discharge, no skin changes. No mass.  Digital Rectal Exam: Normal sphincter tone. No rectal mass.  External Genitalia: No hirsutism, no rash, no scarring, no cyst, no erythematous lesion, no papular lesion, no blanched lesion, no warty lesion. No edema.  Urethral Meatus: Normal size. Normal position. No discharge.  Urethra: No tenderness, no mass, no scarring. No hypermobility. No leakage.  Bladder: Normal to palpation, no tenderness, no mass, normal size.  Vagina: No atrophy, no stenosis. No rectocele. No cystocele. No enterocele.  Cervix: No inflammation, no discharge, no lesion, no tenderness, no wart.  Uterus: Normal size. Normal consistency. Normal position. No mobility. No descent.  Adnexa / Parametria: No tenderness. No adnexal mass. Normal left ovary. Normal right ovary.  Anus and Perineum: No hemorrhoids. No anal stenosis. No rectal fissure, no anal fissure. No edema, no dimple, no perineal tenderness, no anal tenderness.   MULTI-SYSTEM PHYSICAL EXAMINATION:    Constitutional: Well-nourished. No physical deformities. Normally developed. Good grooming.  Neck: Neck symmetrical, not swollen. Normal  tracheal position.  Respiratory: No labored breathing, no use of accessory muscles.   Cardiovascular: Normal temperature, normal extremity pulses, no swelling, no varicosities.  Lymphatic: No enlargement of neck, axillae, groin.  Skin: No paleness, no jaundice, no cyanosis. No lesion, no ulcer, no rash.  Neurologic / Psychiatric: Oriented to time, oriented to place, oriented to person. No depression, no anxiety, no agitation.  Gastrointestinal: No mass, no tenderness, no rigidity, non obese abdomen.  Eyes: Normal conjunctivae. Normal eyelids.  Ears, Nose, Mouth, and Throat: Left ear no scars, no lesions, no masses. Right ear no scars, no lesions, no masses. Nose no scars, no lesions, no masses. Normal hearing. Normal lips.  Musculoskeletal: Normal gait and station of head and neck.     Complexity of Data:  Records Review:   Previous Doctor Records, Previous Patient Records  Urine Test Review:   Urinalysis  X-Ray Review: [facility] Protocol: Reviewed Films. Reviewed Report. Discussed With Patient.    Notes:                     Records from Dr. Sabra Heck reviewed along with labs.    PROCEDURES:         In and Out Catheterization - 51701  A 14 French red catheter  was inserted into the bladder using sterile technique. 110 cc of urine was obtained. A Urinalysis Auto W/Scope was sent to the lab.         Urinalysis w/Scope Dipstick Dipstick Cont'd Micro  Color: Yellow Bilirubin: Neg mg/dL WBC/hpf: 0 - 5/hpf  Appearance: Clear Ketones: Neg mg/dL RBC/hpf: 0 - 2/hpf  Specific Gravity: 1.020 Blood: 1+ ery/uL Bacteria: Few (10-25/hpf)  pH: 6.5 Protein: 2+ mg/dL Cystals: NS (Not Seen)  Glucose: Neg mg/dL Urobilinogen: 0.2 mg/dL Casts: NS (Not Seen)    Nitrites: Neg Trichomonas: Not Present    Leukocyte Esterase: 3+ leu/uL Mucous: Not Present      Epithelial Cells: 0 - 5/hpf      Yeast: NS (Not Seen)      Sperm: Not Present    Notes: QNS TO SPIN          Urinalysis w/Scope Dipstick Dipstick  Cont'd Micro  Color: Yellow Bilirubin: Neg mg/dL WBC/hpf: 0 - 5/hpf  Appearance: Slightly Cloudy Ketones: Neg mg/dL RBC/hpf: 3 - 10/hpf  Specific Gravity: 1.020 Blood: 2+ ery/uL Bacteria: Rare (0-9/hpf)  pH: 6.5 Protein: 2+ mg/dL Cystals: NS (Not Seen)  Glucose: Neg mg/dL Urobilinogen: 0.2 mg/dL Casts: NS (Not Seen)    Nitrites: Neg Trichomonas: Not Present    Leukocyte Esterase: 1+ leu/uL Mucous: Not Present      Epithelial Cells: 0 - 5/hpf      Yeast: NS (Not Seen)      Sperm: Not Present         Notes:   Cath UA doesn't look infected.    ASSESSMENT:      ICD-10 Details  1 GU:   Renal calculus - N20.0 Chronic, Severe Exacerbation - She has a 1.4cm left renal pelvic stone with intermittent pain and hematuria. I discussed ESWL, URS and PCNL with her and will get her scheduled for ESWL. I reviewed the risks of ESWL including bleeding, infection, injury to the kidney or adjacent structures, failure to fragment the stone, need for ancillary procedures, thrombotic events, cardiac arrhythmias and sedation complications.   2   Gross hematuria - R31.0 Chronic, Stable - An unspun urine today has 3+ LE with some bacteria. The cath UA is unremarkable so no culture is indicated.   3   Renal cyst - N28.1 Chronic, Worsening - The cyst is somewhat larger than in 2014 but she is not symptomatic. No treatment needed.    PLAN:            Medications New Meds: Tamsulosin Hcl 0.4 mg capsule 1 capsule PO Daily   #30  1 Refill(s)  Hydromorphone Hcl 2 mg tablet 1 tablet PO Q 6 H PRN   #15  0 Refill(s)            Schedule Return Visit/Planned Activity: ASAP - Schedule Surgery             Note: Left ESWL  Procedure: Unspecified Date - ESWL - 50590 Notes: Next available.

## 2019-12-22 NOTE — ED Provider Notes (Signed)
[facility] DEPT Provider Note   CSN: [id number] Arrival date & time: 12/22/19  1736     History Chief Complaint  Patient presents with  . Urinary Retention    [name] is a 69 y.o. female.  Presents to ER with concern for flank pain.  Patient had lithotripsy for left-sided kidney stone this morning at outpatient surgical center.  States that she has not urinated since procedure.  Also has been having left flank pain.  States that due to her pain and nausea she had 2 episodes of passing out.  No chest.  Vomit is nonbloody nonbilious emesis.  Pain similar in quality to prior issues with kidney stones but seems to be much worse than normal.  Denies fever chills.  Obtained additional history via chart review, review of recent op notes by Dr. Louis Meckel.  HPI     Past Medical History:  Diagnosis Date  . Arthritis   . Chronic fatigue    and weakness  . Colon polyps   . Coronary artery disease    nonobstructive with 20% OM by cath 2006  . Depression   . Diabetes mellitus without complication ([place])   . Diastolic dysfunction    w Elevated LVEDP  . DJD (degenerative joint disease)    C3/4 Dr Sherwood Gambler  . GERD (gastroesophageal reflux disease)   . H/O hiatal hernia   . High cholesterol   . Hypertension   . Hypoglycemia   . Iron deficiency anemia   . Kidney stones   . Multiple thyroid nodules   . Neurocardiogenic syncope   . Obesity   . Prediabetes 2011 & 2012  . Right knee pain 12/2009   Dr Marlou Sa  . Sleep apnea    CPAP had Sleep Study ordered by Dr. Radford Pax  . Tick bite 11/2016  . Vasovagal syncope     Patient Active Problem List   Diagnosis Date Noted  . SOB (shortness of breath) 11/26/2018  . Obesity (BMI 30-39.9) 11/25/2018  . Obstructive sleep apnea 09/01/2013  . Pure hypercholesterolemia 09/01/2013  . Coronary atherosclerosis of native coronary artery 09/01/2013  . Diastolic dysfunction [id number]  . Essential hypertension,  benign 09/01/2013  . Left Achilles tendinitis 01/23/2013    Past Surgical History:  Procedure Laterality Date  . ACHILLES TENDON SURGERY Left 01/23/2013   Procedure: EXCISION PARTIAL BONE TALUS/CALCANEUS, REPAIR RUPTURE ACHILLES TENDON PRIMARY OPEN ;  Surgeon: Ninetta Lights, MD;  Location: [facility];  Service: Orthopedics;  Laterality: Left;  . ANTERIOR FUSION CERVICAL SPINE     x2-cervical  . BREAST BIOPSY Right 2007  . BREAST CYST EXCISION Left   . CARDIAC CATHETERIZATION  ~ 2008   Nonobstructive ASCAD, 20% OM  . CARPAL TUNNEL RELEASE Left   . LITHOTRIPSY    . OTHER SURGICAL HISTORY  06/12/2018   Left big toe nail surgery. [facility]   . TONSILLECTOMY    . TOTAL KNEE ARTHROPLASTY Right 11/2010  . TOTAL KNEE ARTHROPLASTY  12/13/2011   Procedure: TOTAL KNEE ARTHROPLASTY;  Surgeon: Ninetta Lights, MD;  Location: [facility];  Service: Orthopedics;  Laterality: Left;  DR MURPHY WANTS 50 MINUTES FOR THIS CASE     OB History   No obstetric history on file.     Family History  Problem Relation Age of Onset  . CAD Father   . Cancer - Prostate Father   . Heart disease Father   . Diverticulosis Father   . Hypertension  Sister   . Diverticulosis Sister   . Breast cancer Sister   . CVA Mother   . Diverticulosis Mother   . Kidney disease Mother   . Heart disease Paternal Grandfather   . Crohn's disease Paternal Aunt   . Cancer Paternal Aunt        x 2, type unknown  . Colon cancer Other        maternal great Aunt    Social History   Tobacco Use  . Smoking status: Never Smoker  . Smokeless tobacco: Never Used  Substance Use Topics  . Alcohol use: Yes    Alcohol/week: 6.0 standard drinks    Types: 6 Glasses of wine per week    Comment: couple times per week.  . Drug use: No    Home Medications Prior to Admission medications   Medication Sig Start Date End Date Taking? Authorizing Provider  ammonium lactate (LAC-HYDRIN) 12 % cream Apply topically  as needed for dry skin. 01/10/19   Gardiner Barefoot, DPM  aspirin 81 MG tablet Take 81 mg by mouth daily.    [provider]  atorvastatin (LIPITOR) 40 MG tablet Take 1 tablet (40 mg total) by mouth daily. 04/18/19   [name], MD  CALCIUM PO Take 800 mg by mouth daily.    [provider]  Cholecalciferol (VITAMIN D3 PO) Take 2,000 Units by mouth daily.    [provider]  clonazePAM (KLONOPIN) 0.5 MG tablet TAKE 1 TABLET BY MOUTH AT BEDTIME EVERY DAY 06/04/18   [provider]  desvenlafaxine (PRISTIQ) 50 MG 24 hr tablet Take 50 mg by mouth daily.    [provider]  esomeprazole (NEXIUM) 40 MG capsule Take 1 capsule (40 mg total) by mouth 2 (two) times daily before a meal. Patient taking differently: Take 40 mg by mouth as needed.  01/15/17   Armbruster, Carlota Raspberry, MD  ezetimibe (ZETIA) 10 MG tablet Take 1 tablet (10 mg total) by mouth daily. 04/18/19   [name], MD  FERROUS SULFATE PO Take 65 mg by mouth daily.    [provider]  ibuprofen (ADVIL,MOTRIN) 200 MG tablet Take 400 mg by mouth as needed (pain).     [provider]  lisinopril (ZESTRIL) 20 MG tablet 20 mg daily.  10/24/19   [provider]  meloxicam (MOBIC) 15 MG tablet Take 15 mg by mouth as needed.  05/30/18   [provider]  metFORMIN (GLUCOPHAGE) 500 MG tablet Take 500 mg by mouth 2 (two) times daily with a meal.     [provider]  NONFORMULARY OR COMPOUNDED ITEM Apply topically 2 (two) times daily. Salicylic Acid [id number], Urea [id number] Cream, apply to the affected area once or twice daily    Bronson Ing, DPM  Omega-3 Fatty Acids (FISH OIL) 1200 MG CAPS Take 3 capsules by mouth daily.    [provider]  traMADol (ULTRAM) 50 MG tablet Take 1-2 tablets (50-100 mg total) by mouth every 6 (six) hours as needed for moderate pain. 12/22/19   Ardis Hughs, MD  traZODone (DESYREL) 50 MG tablet Take 100 mg by mouth at bedtime.      [provider]    Allergies    Guaifenesin & derivatives, Morphine and related, Penicillins, Tramadol, Hydrocodone-acetaminophen, and Oxycodone hcl  Review of Systems   Review of Systems  Constitutional: Negative for chills and fever.  HENT: Negative for ear pain and sore throat.   Eyes: Negative for  pain and visual disturbance.  Respiratory: Negative for cough and shortness of breath.   Cardiovascular: Negative for chest pain and palpitations.  Gastrointestinal: Positive for nausea and vomiting. Negative for abdominal pain.  Genitourinary: Positive for flank pain. Negative for dysuria and hematuria.  Musculoskeletal: Negative for arthralgias and back pain.  Skin: Negative for color change and rash.  Neurological: Negative for seizures and syncope.  All other systems reviewed and are negative.   Physical Exam Updated Vital Signs BP 125/75   Pulse 82   Temp 98.2 F (36.8 C) (Oral)   Resp 18   Ht 5\' 6"  (1.676 m)   Wt 103 kg   SpO2 92%   BMI 36.64 kg/m   Physical Exam Vitals and nursing note reviewed.  Constitutional:      General: She is not in acute distress.    Appearance: She is well-developed.  HENT:     Head: Normocephalic and atraumatic.  Eyes:     Conjunctiva/sclera: Conjunctivae normal.  Cardiovascular:     Rate and Rhythm: Normal rate and regular rhythm.     Heart sounds: No murmur.  Pulmonary:     Effort: Pulmonary effort is normal. No respiratory distress.     Breath sounds: Normal breath sounds.  Abdominal:     Palpations: Abdomen is soft.     Tenderness: There is no abdominal tenderness.     Comments: Left flank tenderness to palpation  Musculoskeletal:     Cervical back: Neck supple.     Comments: Left flank tenderness to palpation  Skin:    General: Skin is warm and dry.  Neurological:     General: No focal deficit present.     Mental Status: She is alert.     ED Results / Procedures / Treatments   Labs (all labs ordered are  listed, but only abnormal results are displayed) Labs Reviewed  CBC WITH DIFFERENTIAL/PLATELET - Abnormal; Notable for the following components:      Result Value   Hemoglobin 11.2 (*)    HCT 35.3 (*)    Neutro Abs 8.0 (*)    Lymphs Abs 0.5 (*)    All other components within normal limits  COMPREHENSIVE METABOLIC PANEL - Abnormal; Notable for the following components:   Glucose, Bld 145 (*)    Creatinine, Ser 1.02 (*)    GFR calc non Af Amer 56 (*)    All other components within normal limits  SARS CORONAVIRUS 2 BY RT PCR (HOSPITAL ORDER, [facility])  URINALYSIS, ROUTINE W REFLEX MICROSCOPIC    EKG None  Radiology DG Abd 1 View  Result Date: 12/22/2019 CLINICAL DATA:  Preop evaluation for upcoming lithotripsy EXAM: ABDOMEN - 1 VIEW COMPARISON:  12/17/2019 FINDINGS: Scattered large and small bowel gas is noted. Mild retained fecal material is seen. Stable left renal calculi are noted most prominent in the renal pelvis again measuring approximately 16 mm in greatest dimension. Degenerative changes of lumbar spine are seen. IMPRESSION: Mild constipation. Stable left renal calculi similar to that seen on prior CT. Electronically Signed   By: Inez Catalina M.D.   On: 12/22/2019 07:57   CT Renal Stone Study  Result Date: 12/22/2019 CLINICAL DATA:  Left flank pain status post lithotripsy today. EXAM: CT ABDOMEN AND PELVIS WITHOUT CONTRAST TECHNIQUE: Multidetector CT imaging of the abdomen and pelvis was performed following the standard protocol without IV contrast. COMPARISON:  Dec 17, 2019. FINDINGS: Lower chest: No acute abnormality. Hepatobiliary: No focal liver  abnormality is seen. No gallstones, gallbladder wall thickening, or biliary dilatation. Pancreas: Unremarkable. No pancreatic ductal dilatation or surrounding inflammatory changes. Spleen: Normal in size without focal abnormality. Adrenals/Urinary Tract: Adrenal glands appear normal. Stable large simple cyst  is seen arising from upper pole of right kidney. Right kidney and ureter are unremarkable. Urinary bladder is unremarkable. Left nephrolithiasis is noted, including 15 mm calculus in left renal pelvis. No significant hydronephrosis is noted. However, there is the interval development of mild to moderate size left renal subcapsular hematoma. Stomach/Bowel: Stomach is within normal limits. Appendix appears normal. No evidence of bowel wall thickening, distention, or inflammatory changes. Vascular/Lymphatic: Aortic atherosclerosis. No enlarged abdominal or pelvic lymph nodes. Reproductive: Uterus and bilateral adnexa are unremarkable. Other: No abdominal wall hernia or abnormality. No abdominopelvic ascites. Musculoskeletal: No acute or significant osseous findings. IMPRESSION: 1. Interval development of mild to moderate size left renal subcapsular hematoma. 2. Left nephrolithiasis, including 15 mm calculus in left renal pelvis. No significant hydronephrosis is noted. Aortic Atherosclerosis (ICD10-I70.0). Electronically Signed   By: Marijo Conception M.D.   On: 12/22/2019 20:17    Procedures Procedures (including critical care time)  Medications Ordered in ED Medications  fentaNYL (SUBLIMAZE) injection 50 mcg (has no administration in time range)  diphenhydrAMINE (BENADRYL) injection 12.5 mg (has no administration in time range)  sodium chloride 0.9 % bolus 1,000 mL (1,000 mLs Intravenous New Bag/Given 12/22/19 1844)  diphenhydrAMINE (BENADRYL) injection 12.5 mg (12.5 mg Intravenous Given 12/22/19 1858)  fentaNYL (SUBLIMAZE) injection 50 mcg (50 mcg Intravenous Given 12/22/19 1858)    ED Course  I have reviewed the triage vital signs and the nursing notes.  Pertinent labs & imaging results that were available during my care of the patient were reviewed by me and considered in my medical decision making (see chart for details).  Clinical Course as of Dec 21 2120  Mon Dec 22, 2019  2104 D/w [facility] - rec  hosp admit, repeat Hgb in am, if stable, then likely dc home tomorrow  CT Renal Laren Everts [RD]  2121 D/w hosp   [RD]    Clinical Course User Index [RD] Lucrezia Starch, MD   MDM Rules/Calculators/A&P                      69 year old lady presenting to ER with worsening left flank pain, inability to urinate post lithotripsy.  CT scan concerning for mild to moderate subcapsular hematoma.  Hemoglobin stable at 11.  Reviewed with urology on-call, Dr. Claudia Desanctis, she recommends hospitalist admission, repeat hemoglobin in morning, no emergent procedure needed tonight.  She will see in a.m.  Discussed case with hospitalist who will admit.  Regarding concern for possible urinary retention, on initial bladder scan, patient had minimal urine in the bladder, suspect this is related to her poor p.o. intake and vomiting.  Provided fluids, will repeat bladder scan and Place Foley as needed later tonight.    Final Clinical Impression(s) / ED Diagnoses Final diagnoses:  Hematoma of left kidney, initial encounter  Nephrolithiasis    Rx / DC Orders ED Discharge Orders    None       Lucrezia Starch, MD 12/22/19 2127

## 2019-12-22 NOTE — Interval H&P Note (Signed)
History and Physical Interval Note:  12/22/2019 8:33 AM  [name]  has presented today for surgery, with the diagnosis of left renal stone.  The various methods of treatment have been discussed with the patient and family. After consideration of risks, benefits and other options for treatment, the patient has consented to  Procedure(s): LEFT EXTRACORPOREAL SHOCK WAVE LITHOTRIPSY (ESWL) (Left) as a surgical intervention.  The patient's history has been reviewed, patient examined, no change in status, stable for surgery.  I have reviewed the patient's chart and labs.  Questions were answered to the patient's satisfaction.     Ardis Hughs

## 2019-12-23 ENCOUNTER — Inpatient Hospital Stay (HOSPITAL_COMMUNITY): Payer: Medicare Other | Admitting: Certified Registered Nurse Anesthetist

## 2019-12-23 ENCOUNTER — Inpatient Hospital Stay (HOSPITAL_COMMUNITY): Payer: Medicare Other

## 2019-12-23 ENCOUNTER — Encounter (HOSPITAL_COMMUNITY)
Admission: EM | Disposition: A | Discharge status: Home / Self Care | Payer: Self-pay | Source: Home / Self Care | Attending: Internal Medicine

## 2019-12-23 DIAGNOSIS — E669 Obesity, unspecified: Secondary | ICD-10-CM | POA: Diagnosis not present

## 2019-12-23 DIAGNOSIS — E785 Hyperlipidemia, unspecified: Secondary | ICD-10-CM | POA: Diagnosis not present

## 2019-12-23 DIAGNOSIS — E119 Type 2 diabetes mellitus without complications: Secondary | ICD-10-CM | POA: Diagnosis not present

## 2019-12-23 DIAGNOSIS — N132 Hydronephrosis with renal and ureteral calculous obstruction: Secondary | ICD-10-CM | POA: Diagnosis not present

## 2019-12-23 DIAGNOSIS — S37012A Minor contusion of left kidney, initial encounter: Secondary | ICD-10-CM | POA: Diagnosis not present

## 2019-12-23 DIAGNOSIS — J9601 Acute respiratory failure with hypoxia: Secondary | ICD-10-CM | POA: Diagnosis not present

## 2019-12-23 DIAGNOSIS — N200 Calculus of kidney: Secondary | ICD-10-CM | POA: Diagnosis not present

## 2019-12-23 DIAGNOSIS — I10 Essential (primary) hypertension: Secondary | ICD-10-CM | POA: Diagnosis not present

## 2019-12-23 DIAGNOSIS — N179 Acute kidney failure, unspecified: Secondary | ICD-10-CM | POA: Diagnosis not present

## 2019-12-23 DIAGNOSIS — E7800 Pure hypercholesterolemia, unspecified: Secondary | ICD-10-CM | POA: Diagnosis not present

## 2019-12-23 DIAGNOSIS — J9811 Atelectasis: Secondary | ICD-10-CM | POA: Diagnosis not present

## 2019-12-23 DIAGNOSIS — R339 Retention of urine, unspecified: Secondary | ICD-10-CM | POA: Diagnosis not present

## 2019-12-23 DIAGNOSIS — G4733 Obstructive sleep apnea (adult) (pediatric): Secondary | ICD-10-CM | POA: Diagnosis not present

## 2019-12-23 DIAGNOSIS — R338 Other retention of urine: Secondary | ICD-10-CM | POA: Diagnosis not present

## 2019-12-23 DIAGNOSIS — N99840 Postprocedural hematoma of a genitourinary system organ or structure following a genitourinary system procedure: Secondary | ICD-10-CM | POA: Diagnosis not present

## 2019-12-23 DIAGNOSIS — I2510 Atherosclerotic heart disease of native coronary artery without angina pectoris: Secondary | ICD-10-CM | POA: Diagnosis not present

## 2019-12-23 DIAGNOSIS — N201 Calculus of ureter: Secondary | ICD-10-CM | POA: Diagnosis not present

## 2019-12-23 DIAGNOSIS — R55 Syncope and collapse: Secondary | ICD-10-CM | POA: Diagnosis not present

## 2019-12-23 DIAGNOSIS — Z20822 Contact with and (suspected) exposure to covid-19: Secondary | ICD-10-CM | POA: Diagnosis not present

## 2019-12-23 HISTORY — PX: CYSTOSCOPY W/ URETERAL STENT PLACEMENT: SHX1429

## 2019-12-23 LAB — CBC
HCT: 33.7 % — ABNORMAL LOW (ref 36.0–46.0)
Hemoglobin: 10.5 g/dL — ABNORMAL LOW (ref 12.0–15.0)
MCH: 27.6 pg (ref 26.0–34.0)
MCHC: 31.2 g/dL (ref 30.0–36.0)
MCV: 88.7 fL (ref 80.0–100.0)
Platelets: 176 10*3/uL (ref 150–400)
RBC: 3.8 MIL/uL — ABNORMAL LOW (ref 3.87–5.11)
RDW: 14.6 % (ref 11.5–15.5)
WBC: 7.2 10*3/uL (ref 4.0–10.5)
nRBC: 0 % (ref 0.0–0.2)

## 2019-12-23 LAB — BASIC METABOLIC PANEL
Anion gap: 10 (ref 5–15)
BUN: 16 mg/dL (ref 8–23)
CO2: 25 mmol/L (ref 22–32)
Calcium: 9.7 mg/dL (ref 8.9–10.3)
Chloride: 104 mmol/L (ref 98–111)
Creatinine, Ser: 1.17 mg/dL — ABNORMAL HIGH (ref 0.44–1.00)
GFR calc Af Amer: 55 mL/min — ABNORMAL LOW (ref >60)
GFR calc non Af Amer: 48 mL/min — ABNORMAL LOW (ref >60)
Glucose, Bld: 122 mg/dL — ABNORMAL HIGH (ref 70–99)
Potassium: 4.1 mmol/L (ref 3.5–5.1)
Sodium: 139 mmol/L (ref 135–145)

## 2019-12-23 LAB — SARS CORONAVIRUS 2 BY RT PCR (HOSPITAL ORDER, PERFORMED IN ~~LOC~~ HOSPITAL LAB): SARS Coronavirus 2: NEGATIVE

## 2019-12-23 LAB — GLUCOSE, CAPILLARY: Glucose-Capillary: 148 mg/dL — ABNORMAL HIGH (ref 70–99)

## 2019-12-23 LAB — HIV ANTIBODY (ROUTINE TESTING W REFLEX): HIV Screen 4th Generation wRfx: NONREACTIVE

## 2019-12-23 SURGERY — CYSTOSCOPY, WITH RETROGRADE PYELOGRAM AND URETERAL STENT INSERTION
Anesthesia: General | Site: Ureter | Laterality: Left

## 2019-12-23 MED ORDER — LIDOCAINE 2% (20 MG/ML) 5 ML SYRINGE
INTRAMUSCULAR | Status: AC
  Filled 2019-12-23: qty 5

## 2019-12-23 MED ORDER — SODIUM CHLORIDE 0.9 % IV SOLN
INTRAVENOUS | Status: DC | PRN
  Administered 2019-12-23: 5 mL

## 2019-12-23 MED ORDER — FENTANYL CITRATE (PF) 100 MCG/2ML IJ SOLN: 25.0000 ug | INTRAMUSCULAR | Status: DC | PRN

## 2019-12-23 MED ORDER — ONDANSETRON HCL 4 MG/2ML IJ SOLN
INTRAMUSCULAR | Status: AC
  Filled 2019-12-23: qty 2

## 2019-12-23 MED ORDER — BELLADONNA ALKALOIDS-OPIUM 16.2-60 MG RE SUPP
RECTAL | Status: DC | PRN
  Administered 2019-12-23: 1 via RECTAL

## 2019-12-23 MED ORDER — CIPROFLOXACIN IN D5W 400 MG/200ML IV SOLN
400.0000 mg | INTRAVENOUS | Status: AC
  Administered 2019-12-23: 400 mg via INTRAVENOUS

## 2019-12-23 MED ORDER — LIDOCAINE 2% (20 MG/ML) 5 ML SYRINGE
INTRAMUSCULAR | Status: DC | PRN
  Administered 2019-12-23: 100 mg via INTRAVENOUS

## 2019-12-23 MED ORDER — ACETAMINOPHEN 500 MG PO TABS
1000.0000 mg | ORAL_TABLET | ORAL | Status: AC
  Administered 2019-12-23: 1000 mg via ORAL
  Filled 2019-12-23: qty 2

## 2019-12-23 MED ORDER — FENTANYL CITRATE (PF) 100 MCG/2ML IJ SOLN
INTRAMUSCULAR | Status: DC | PRN
  Administered 2019-12-23 (×2): 25 ug via INTRAVENOUS
  Administered 2019-12-23: 50 ug via INTRAVENOUS

## 2019-12-23 MED ORDER — FENTANYL CITRATE (PF) 100 MCG/2ML IJ SOLN
25.0000 ug | INTRAMUSCULAR | Status: DC | PRN
  Administered 2019-12-23 (×2): 25 ug via INTRAVENOUS
  Filled 2019-12-23 (×2): qty 2

## 2019-12-23 MED ORDER — DEXAMETHASONE SODIUM PHOSPHATE 10 MG/ML IJ SOLN
INTRAMUSCULAR | Status: AC
  Filled 2019-12-23: qty 1

## 2019-12-23 MED ORDER — DIPHENHYDRAMINE HCL 25 MG PO CAPS
25.0000 mg | ORAL_CAPSULE | Freq: Four times a day (QID) | ORAL | Status: DC | PRN
  Administered 2019-12-23 (×2): 25 mg via ORAL
  Filled 2019-12-23 (×2): qty 1

## 2019-12-23 MED ORDER — CHLORHEXIDINE GLUCONATE 0.12 % MT SOLN
15.0000 mL | Freq: Two times a day (BID) | OROMUCOSAL | Status: DC
  Administered 2019-12-23: 15 mL via OROMUCOSAL

## 2019-12-23 MED ORDER — PHENAZOPYRIDINE HCL 200 MG PO TABS
200.0000 mg | ORAL_TABLET | Freq: Three times a day (TID) | ORAL | Status: DC | PRN
  Administered 2019-12-23 – 2019-12-24 (×2): 200 mg via ORAL
  Filled 2019-12-23 (×4): qty 1

## 2019-12-23 MED ORDER — FENTANYL CITRATE (PF) 100 MCG/2ML IJ SOLN
INTRAMUSCULAR | Status: AC
  Filled 2019-12-23: qty 2

## 2019-12-23 MED ORDER — PROPOFOL 10 MG/ML IV BOLUS
INTRAVENOUS | Status: DC | PRN
  Administered 2019-12-23: 150 mg via INTRAVENOUS

## 2019-12-23 MED ORDER — BELLADONNA ALKALOIDS-OPIUM 16.2-30 MG RE SUPP
RECTAL | Status: AC
  Filled 2019-12-23: qty 1

## 2019-12-23 MED ORDER — CHLORHEXIDINE GLUCONATE CLOTH 2 % EX PADS
6.0000 | MEDICATED_PAD | Freq: Every day | CUTANEOUS | Status: DC
  Administered 2019-12-23: 6 via TOPICAL

## 2019-12-23 MED ORDER — EPHEDRINE SULFATE-NACL 50-0.9 MG/10ML-% IV SOSY
PREFILLED_SYRINGE | INTRAVENOUS | Status: DC | PRN
  Administered 2019-12-23 (×2): 10 mg via INTRAVENOUS

## 2019-12-23 MED ORDER — ONDANSETRON HCL 4 MG/2ML IJ SOLN
INTRAMUSCULAR | Status: DC | PRN
  Administered 2019-12-23: 4 mg via INTRAVENOUS

## 2019-12-23 MED ORDER — PHENAZOPYRIDINE HCL 200 MG PO TABS
200.0000 mg | ORAL_TABLET | Freq: Three times a day (TID) | ORAL | 0 refills | Status: DC | PRN
Start: 2019-12-23 — End: 2020-02-03

## 2019-12-23 MED ORDER — DEXAMETHASONE SODIUM PHOSPHATE 10 MG/ML IJ SOLN
INTRAMUSCULAR | Status: DC | PRN
  Administered 2019-12-23: 10 mg via INTRAVENOUS

## 2019-12-23 MED ORDER — EPHEDRINE 5 MG/ML INJ
INTRAVENOUS | Status: AC
  Filled 2019-12-23: qty 20

## 2019-12-23 MED ORDER — LACTATED RINGERS IV SOLN: INTRAVENOUS | Status: DC

## 2019-12-23 MED ORDER — PROMETHAZINE HCL 25 MG/ML IJ SOLN: 6.2500 mg | INTRAMUSCULAR | Status: DC | PRN

## 2019-12-23 MED ORDER — PHENYLEPHRINE 40 MCG/ML (10ML) SYRINGE FOR IV PUSH (FOR BLOOD PRESSURE SUPPORT)
PREFILLED_SYRINGE | INTRAVENOUS | Status: DC | PRN
  Administered 2019-12-23 (×5): 80 ug via INTRAVENOUS
  Administered 2019-12-23: 120 ug via INTRAVENOUS

## 2019-12-23 MED ORDER — SODIUM CHLORIDE 0.9 % IR SOLN
Status: DC | PRN
  Administered 2019-12-23: 3000 mL

## 2019-12-23 MED ORDER — CLONAZEPAM 0.5 MG PO TABS
0.5000 mg | ORAL_TABLET | Freq: Every day | ORAL | Status: DC
  Administered 2019-12-23: 0.5 mg via ORAL
  Filled 2019-12-23: qty 1

## 2019-12-23 MED ORDER — LACTATED RINGERS IV SOLN: INTRAVENOUS | Status: DC | PRN

## 2019-12-23 MED ORDER — CIPROFLOXACIN IN D5W 400 MG/200ML IV SOLN
INTRAVENOUS | Status: AC
  Filled 2019-12-23: qty 200

## 2019-12-23 MED ORDER — SODIUM CHLORIDE 0.9 % IV SOLN: INTRAVENOUS | Status: DC

## 2019-12-23 SURGICAL SUPPLY — 17 items
BAG URO CATCHER STRL LF (MISCELLANEOUS) ×2 IMPLANT
BASKET ZERO TIP NITINOL 2.4FR (BASKET) IMPLANT
CATH URET 5FR 28IN OPEN ENDED (CATHETERS) ×2 IMPLANT
CLOTH BEACON ORANGE TIMEOUT ST (SAFETY) ×2 IMPLANT
EXTRACTOR STONE 1.7FRX115CM (UROLOGICAL SUPPLIES) IMPLANT
GLOVE BIOGEL M STRL SZ7.5 (GLOVE) ×2 IMPLANT
GOWN STRL REUS W/TWL XL LVL3 (GOWN DISPOSABLE) ×2 IMPLANT
GUIDEWIRE ANG ZIPWIRE 038X150 (WIRE) IMPLANT
GUIDEWIRE STR DUAL SENSOR (WIRE) ×2 IMPLANT
KIT TURNOVER KIT A (KITS) IMPLANT
MANIFOLD NEPTUNE II (INSTRUMENTS) ×2 IMPLANT
SHEATH URETERAL 12FRX28CM (UROLOGICAL SUPPLIES) IMPLANT
SHEATH URETERAL 12FRX35CM (MISCELLANEOUS) IMPLANT
STENT URET 6FRX24 CONTOUR (STENTS) ×1 IMPLANT
TRAY CYSTO PACK (CUSTOM PROCEDURE TRAY) ×2 IMPLANT
TUBING CONNECTING 10 (TUBING) ×2 IMPLANT
TUBING UROLOGY SET (TUBING) ×2 IMPLANT

## 2019-12-23 NOTE — Telephone Encounter (Signed)
   Primary Cardiologist: Fransico Him, MD  Chart reviewed as part of pre-operative protocol coverage. Given past medical history and time since last visit, based on ACC/AHA guidelines, [name] would be at acceptable risk for the planned procedure without further cardiovascular testing.   She may hold her aspirin for 7 days prior to her procedure and resume as soon as hemostasis is achieved.  I will route this recommendation to the requesting party via Epic fax function and remove from pre-op pool.  Please call with questions.  Jossie Ng. Gaylia Kassel NP-C    12/23/2019, 10:46 AM [place] [address] [phone] Fax [phone]

## 2019-12-23 NOTE — Anesthesia Preprocedure Evaluation (Addendum)
Anesthesia Evaluation  Patient identified by MRN, date of birth, ID band Patient awake    Reviewed: Allergy & Precautions, NPO status , Patient's Chart, lab work & pertinent test results  History of Anesthesia Complications Negative for: history of anesthetic complications  Airway Mallampati: II  TM Distance: >3 FB Neck ROM: Full    Dental no notable dental hx. (+) Dental Advisory Given   Pulmonary sleep apnea and Continuous Positive Airway Pressure Ventilation ,    Pulmonary exam normal        Cardiovascular hypertension, Pt. on medications Normal cardiovascular exam  Study Highlights   Nuclear stress EF: 63%. The left ventricular ejection fraction is normal (55-65%).  There was no ST segment deviation noted during stress.  This is a low risk study. No evidence of ischemia or infarction.  The study is normal.       Neuro/Psych negative neurological ROS     GI/Hepatic Neg liver ROS, hiatal hernia, GERD  ,  Endo/Other  negative endocrine ROSdiabetes  Renal/GU      Musculoskeletal negative musculoskeletal ROS (+)   Abdominal   Peds  Hematology negative hematology ROS (+)   Anesthesia Other Findings   Reproductive/Obstetrics                            Anesthesia Physical Anesthesia Plan  ASA: III  Anesthesia Plan: General   Post-op Pain Management:    Induction: Intravenous  PONV Risk Score and Plan: 4 or greater and Ondansetron, Dexamethasone and Treatment may vary due to age or medical condition  Airway Management Planned: LMA  Additional Equipment:   Intra-op Plan:   Post-operative Plan: Extubation in OR  Informed Consent: I have reviewed the patients History and Physical, chart, labs and discussed the procedure including the risks, benefits and alternatives for the proposed anesthesia with the patient or authorized representative who has indicated his/her  understanding and acceptance.     Dental advisory given  Plan Discussed with: Anesthesiologist  Anesthesia Plan Comments:        Anesthesia Quick Evaluation

## 2019-12-23 NOTE — Op Note (Addendum)
Preoperative diagnosis:  1. Left obstructing ureteral stone   Postoperative diagnosis:  1. Left obstructing ureteral stone   Procedure:  1. Cystoscopy 2. LEFT ureteral stent placement (6Fr x 24cm) no string 3. LEFT retrograde pyelography with interpretation  Surgeon: Dr. Louis Meckel, MD  Assistant: Dr. Sharlot Gowda, MD  Anesthesia: General  Complications: None  Intraoperative findings:  -Stone sediment in bladder on cystoscopy -Left retrograde pyelogram with normal ureter, filling defect at UPJ, heterogeneous appearance of renal pelvis favored to be clot, mild hydronephrosis. 5cc of Omnipaque used.  -Uncomplicated left ureteral stent placement with return of clot  EBL: Minimal  Specimens: None  Indication: [name] is a 69 y.o. patient with large left renal pelvis stone s/p ESWL who returned after worsening left flank pain, nausea, and presyncopal episode. After reviewing the management options for treatment, she elected to proceed with the above surgical procedure(s). We have discussed the potential benefits and risks of the procedure, side effects of the proposed treatment, the likelihood of the patient achieving the goals of the procedure, and any potential problems that might occur during the procedure or recuperation. Informed consent has been obtained.  Description of procedure:  The patient was taken to the operating room and general anesthesia was induced.  The patient was placed in the dorsal lithotomy position, prepped and draped in the usual sterile fashion, and preoperative antibiotics were administered. A preoperative time-out was performed.   Cystourethroscopy was performed. The bladder was systematically examined in its entirety. There was no evidence for any bladder tumors or other mucosal pathology but did have some stone debris.    Attention then turned to the left ureteral orifice and a ureteral catheter was used to intubate the ureteral orifice.   Omnipaque contrast was injected through the ureteral catheter and a retrograde pyelogram was performed with findings as dictated above.  A 0.38 sensor guidewire was then advanced up the left ureter into the upper pole under fluoroscopic guidance. A ureteral stent was advance over the wire using Seldinger technique.  The stent was positioned appropriately under fluoroscopic and cystoscopic guidance.  The wire was then removed with an adequate stent curl noted in the renal pelvis as well as in the bladder. There was return of small volume clot debris.  The bladder was then emptied and the procedure ended. B&O suppository was placed. The patient appeared to tolerate the procedure well and without complications.  The patient was able to be awakened and transferred to the recovery unit in satisfactory condition.    Plan: Continue left ureteral stent Consider Flomax 0.4mg  qHS Consider ditropan for bladder spasms Replace foley if develops urinary retention RTC as planned

## 2019-12-23 NOTE — ED Notes (Signed)
ED TO INPATIENT HANDOFF REPORT  Name/Age/Gender [name] 69 y.o. female  Code Status    Code Status Orders  (From admission, onward)         Start     Ordered   12/22/19 2201  Full code  Continuous     12/22/19 2200        Code Status History    This patient has a current code status but no historical code status.   Advance Care Planning Activity      Home/SNF/Other Home  Chief Complaint Syncope [R55]  Level of Care/Admitting Diagnosis ED Disposition    ED Disposition Condition Comment   Admit  Hospital Area: [facility] [[id number]]  Level of Care: Telemetry [5]  Admit to tele based on following criteria: Eval of Syncope  May admit patient to [facility] or [facility] if equivalent level of care is available:: No  Covid Evaluation: Asymptomatic Screening Protocol (No Symptoms)  Diagnosis: Syncope [206001]  Admitting Physician: Orene Desanctis [id number]  Attending Physician: Orene Desanctis [id number]  Estimated length of stay: past midnight tomorrow  Certification:: I certify this patient will need inpatient services for at least 2 midnights       Medical History Past Medical History:  Diagnosis Date  . Arthritis   . Chronic fatigue    and weakness  . Colon polyps   . Coronary artery disease    nonobstructive with 20% OM by cath 2006  . Depression   . Diabetes mellitus without complication ([place])   . Diastolic dysfunction    w Elevated LVEDP  . DJD (degenerative joint disease)    C3/4 Dr Sherwood Gambler  . GERD (gastroesophageal reflux disease)   . H/O hiatal hernia   . High cholesterol   . Hypertension   . Hypoglycemia   . Iron deficiency anemia   . Kidney stones   . Multiple thyroid nodules   . Neurocardiogenic syncope   . Obesity   . Prediabetes 2011 & 2012  . Right knee pain 12/2009   Dr Marlou Sa  . Sleep apnea    CPAP had Sleep Study ordered by Dr. Radford Pax  . Tick bite 11/2016  . Vasovagal syncope     Allergies Allergies   Allergen Reactions  . Guaifenesin & Derivatives Itching  . Morphine And Related Itching    Hydrocodone and oxycodone  . Penicillins Itching  . Tramadol Itching  . Hydrocodone-Acetaminophen Itching  . Oxycodone Hcl Itching    IV Location/Drains/Wounds Patient Lines/Drains/Airways Status   Active Line/Drains/Airways    Name:   Placement date:   Placement time:   Site:   Days:   Peripheral IV 12/22/19 Left;Posterior Hand   12/22/19    --    Hand   1   Closed System Drain 1 Left;Lateral Knee Accordion (Hemovac) 10 Fr.   12/13/11    0940    Knee   2932   Incision 12/13/11 Knee Left   12/13/11    0911     2932   Incision 01/23/13 Foot Left   01/23/13    0938     2525          Labs/Imaging Results for orders placed or performed during the hospital encounter of 12/22/19 (from the past 48 hour(s))  CBC with Differential     Status: Abnormal   Collection Time: 12/22/19  6:45 PM  Result Value Ref Range   WBC 8.9 4.0 - 10.5 K/uL   RBC 3.96  3.87 - 5.11 MIL/uL   Hemoglobin 11.2 (L) 12.0 - 15.0 g/dL   HCT 35.3 (L) 36.0 - 46.0 %   MCV 89.1 80.0 - 100.0 fL   MCH 28.3 26.0 - 34.0 pg   MCHC 31.7 30.0 - 36.0 g/dL   RDW 14.7 11.5 - 15.5 %   Platelets 188 150 - 400 K/uL   nRBC 0.0 0.0 - 0.2 %   Neutrophils Relative % 89 %   Neutro Abs 8.0 (H) 1.7 - 7.7 K/uL   Lymphocytes Relative 5 %   Lymphs Abs 0.5 (L) 0.7 - 4.0 K/uL   Monocytes Relative 5 %   Monocytes Absolute 0.4 0.1 - 1.0 K/uL   Eosinophils Relative 0 %   Eosinophils Absolute 0.0 0.0 - 0.5 K/uL   Basophils Relative 0 %   Basophils Absolute 0.0 0.0 - 0.1 K/uL   Immature Granulocytes 1 %   Abs Immature Granulocytes 0.05 0.00 - 0.07 K/uL    Comment: Performed at [facility], [place] [address] [place], [place] [address]  Comprehensive metabolic panel     Status: Abnormal   Collection Time: 12/22/19  6:45 PM  Result Value Ref Range   Sodium 140 135 - 145 mmol/L   Potassium 3.9 3.5 - 5.1 mmol/L   Chloride 103 98  - 111 mmol/L   CO2 27 22 - 32 mmol/L   Glucose, Bld 145 (H) 70 - 99 mg/dL    Comment: Glucose reference range applies only to samples taken after fasting for at least 8 hours.   BUN 19 8 - 23 mg/dL   Creatinine, Ser 1.02 (H) 0.44 - 1.00 mg/dL   Calcium 9.8 8.9 - 10.3 mg/dL   Total Protein 6.8 6.5 - 8.1 g/dL   Albumin 4.0 3.5 - 5.0 g/dL   AST 20 15 - 41 U/L   ALT 25 0 - 44 U/L   Alkaline Phosphatase 68 38 - 126 U/L   Total Bilirubin 0.3 0.3 - 1.2 mg/dL   GFR calc non Af Amer 56 (L) >60 mL/min   GFR calc Af Amer >60 >60 mL/min   Anion gap 10 5 - 15    Comment: Performed at [facility], [place] [address], [place], [place] [address]  Urinalysis, Routine w reflex microscopic     Status: Abnormal   Collection Time: 12/22/19 11:00 PM  Result Value Ref Range   Color, Urine AMBER (A) YELLOW    Comment: BIOCHEMICALS MAY BE AFFECTED BY COLOR   APPearance HAZY (A) CLEAR   Specific Gravity, Urine 1.010 1.005 - 1.030   pH 6.0 5.0 - 8.0   Glucose, UA NEGATIVE NEGATIVE mg/dL   Hgb urine dipstick LARGE (A) NEGATIVE   Bilirubin Urine NEGATIVE NEGATIVE   Ketones, ur NEGATIVE NEGATIVE mg/dL   Protein, ur 100 (A) NEGATIVE mg/dL   Nitrite NEGATIVE NEGATIVE   Leukocytes,Ua NEGATIVE NEGATIVE   RBC / HPF >50 (H) 0 - 5 RBC/hpf   WBC, UA 21-50 0 - 5 WBC/hpf   Bacteria, UA RARE (A) NONE SEEN   Mucus PRESENT     Comment: Performed at [facility], [place] [address], [place], [place] [address]   DG Abd 1 View  Result Date: 12/22/2019 CLINICAL DATA:  Preop evaluation for upcoming lithotripsy EXAM: ABDOMEN - 1 VIEW COMPARISON:  12/17/2019 FINDINGS: Scattered large and small bowel gas is noted. Mild retained fecal material is seen. Stable left renal calculi are noted most prominent in the renal pelvis  again measuring approximately 16 mm in greatest dimension. Degenerative changes of lumbar spine are seen. IMPRESSION: Mild constipation. Stable left renal calculi similar to that  seen on prior CT. Electronically Signed   By: Inez Catalina M.D.   On: 12/22/2019 07:57   CT Renal Stone Study  Result Date: 12/22/2019 CLINICAL DATA:  Left flank pain status post lithotripsy today. EXAM: CT ABDOMEN AND PELVIS WITHOUT CONTRAST TECHNIQUE: Multidetector CT imaging of the abdomen and pelvis was performed following the standard protocol without IV contrast. COMPARISON:  Dec 17, 2019. FINDINGS: Lower chest: No acute abnormality. Hepatobiliary: No focal liver abnormality is seen. No gallstones, gallbladder wall thickening, or biliary dilatation. Pancreas: Unremarkable. No pancreatic ductal dilatation or surrounding inflammatory changes. Spleen: Normal in size without focal abnormality. Adrenals/Urinary Tract: Adrenal glands appear normal. Stable large simple cyst is seen arising from upper pole of right kidney. Right kidney and ureter are unremarkable. Urinary bladder is unremarkable. Left nephrolithiasis is noted, including 15 mm calculus in left renal pelvis. No significant hydronephrosis is noted. However, there is the interval development of mild to moderate size left renal subcapsular hematoma. Stomach/Bowel: Stomach is within normal limits. Appendix appears normal. No evidence of bowel wall thickening, distention, or inflammatory changes. Vascular/Lymphatic: Aortic atherosclerosis. No enlarged abdominal or pelvic lymph nodes. Reproductive: Uterus and bilateral adnexa are unremarkable. Other: No abdominal wall hernia or abnormality. No abdominopelvic ascites. Musculoskeletal: No acute or significant osseous findings. IMPRESSION: 1. Interval development of mild to moderate size left renal subcapsular hematoma. 2. Left nephrolithiasis, including 15 mm calculus in left renal pelvis. No significant hydronephrosis is noted. Aortic Atherosclerosis (ICD10-I70.0). Electronically Signed   By: Marijo Conception M.D.   On: 12/22/2019 20:17    Pending Labs Unresulted Labs (From admission, onward)    Start      Ordered   12/23/19 [id number]  Basic metabolic panel  Tomorrow morning,   R     12/22/19 2200   12/23/19 0500  CBC  Tomorrow morning,   R     12/22/19 2200   12/22/19 2200  HIV Antibody (routine testing w rflx)  (HIV Antibody (Routine testing w reflex) panel)  Once,   STAT     12/22/19 2200   12/22/19 2105  SARS Coronavirus 2 by RT PCR (hospital order, performed in [facility] hospital lab) Nasopharyngeal Nasopharyngeal Swab  (Tier 2 (TAT 2 hrs))  Once,   STAT    Question Answer Comment  Is this test for diagnosis or screening Screening   Symptomatic for COVID-19 as defined by CDC No   Hospitalized for COVID-19 No   Admitted to ICU for COVID-19 No   Previously tested for COVID-19 Yes   Resident in a congregate (group) care setting No   Employed in healthcare setting No   Pregnant No   Has patient completed COVID vaccination(s) (2 doses of Pfizer/Moderna 1 dose of [employer]) No      12/22/19 2104          Vitals/Pain Today's Vitals   12/22/19 2221 12/22/19 2230 12/22/19 2325 12/22/19 2330  BP: (!) 141/84 (!) 148/87  (!) 149/89  Pulse: 79 83  88  Resp: 18   18  Temp:      TempSrc:      SpO2: 95% 91%  96%  Weight:      Height:      PainSc:   4      Isolation Precautions No active isolations  Medications Medications  aspirin tablet  81 mg (has no administration in time range)  HYDROmorphone (DILAUDID) tablet 2 mg (has no administration in time range)  atorvastatin (LIPITOR) tablet 40 mg (has no administration in time range)  ezetimibe (ZETIA) tablet 10 mg (has no administration in time range)  lisinopril (ZESTRIL) tablet 20 mg (has no administration in time range)  traZODone (DESYREL) tablet 100 mg (100 mg Oral Given 12/22/19 2256)  venlafaxine XR (EFFEXOR-XR) 24 hr capsule 75 mg (has no administration in time range)  tamsulosin (FLOMAX) capsule 0.4 mg (has no administration in time range)  clonazePAM (KLONOPIN) tablet 0.5 mg (has no administration in time range)   acetaminophen (TYLENOL) tablet 650 mg (has no administration in time range)  ondansetron (ZOFRAN) injection 4 mg (4 mg Intravenous Given 12/22/19 2256)  promethazine (PHENERGAN) injection 12.5 mg (12.5 mg Intravenous Given 12/23/19 0037)  calcium carbonate (TUMS - dosed in mg elemental calcium) chewable tablet 200 mg of elemental calcium (has no administration in time range)  diphenhydrAMINE (BENADRYL) capsule 25 mg (has no administration in time range)  fentaNYL (SUBLIMAZE) injection 25 mcg (has no administration in time range)  sodium chloride 0.9 % bolus 1,000 mL (0 mLs Intravenous Stopped 12/22/19 2326)  diphenhydrAMINE (BENADRYL) injection 12.5 mg (12.5 mg Intravenous Given 12/22/19 1858)  fentaNYL (SUBLIMAZE) injection 50 mcg (50 mcg Intravenous Given 12/22/19 1858)  fentaNYL (SUBLIMAZE) injection 50 mcg (50 mcg Intravenous Given 12/22/19 2150)  diphenhydrAMINE (BENADRYL) injection 12.5 mg (12.5 mg Intravenous Given 12/22/19 2151)    Mobility walks

## 2019-12-23 NOTE — Transfer of Care (Signed)
Immediate Anesthesia Transfer of Care Note  Patient: [name]  Procedure(s) Performed: CYSTOSCOPY WITH RETROGRADE PYELOGRAM/URETERAL STENT PLACEMENT (Left Ureter)  Patient Location: PACU  Anesthesia Type:General  Level of Consciousness: awake, alert , oriented and patient cooperative  Airway & Oxygen Therapy: Patient Spontanous Breathing and Patient connected to face mask oxygen  Post-op Assessment: Report given to RN, Post -op Vital signs reviewed and stable and Patient moving all extremities  Post vital signs: Reviewed and stable  Last Vitals:  Vitals Value Taken Time  BP 110/55 12/23/19 1740  Temp    Pulse 97 12/23/19 1742  Resp 13 12/23/19 1742  SpO2 100 % 12/23/19 1742  Vitals shown include unvalidated device data.  Last Pain:  Vitals:   12/23/19 1515  TempSrc: Oral  PainSc:       Patients Stated Pain Goal: 3 ([id number])  Complications: No apparent anesthesia complications

## 2019-12-23 NOTE — H&P (View-Only) (Signed)
I have been asked to see the patient by Dr. Raiford Noble, for evaluation and management of left obstructing stone, left subcapsular hematoma.  History of present illness: 69 year old female who is status post left shockwave lithotripsy for a large left UPJ stone.  Following the procedures was discharged home and subsequently developed worsening left-sided flank pain with associated nausea.  Patient then had a presyncopal event falling in her bathroom.  She then called 911 when she was taken to the emergency department.  In the emergency department the patient was noted to have urinary retention and severe left-sided flank pain.  A CT scan was performed showing that she had left-sided proximal hydronephrosis with a persistent large left-sided UPJ stone and a subcapsular hematoma.Foley catheter was placed because she was unable to provide a urine specimen had not voided all day.  Her urine was noted to be bloody.  Today, the patient appears to be doing much better.  Her pain has been fairly well controlled since she was in the emergency department.  She had associated nausea and vomiting.  She has not had any fevers.  Vital signs are stable.  Review of systems: A 12 point comprehensive review of systems was obtained and is negative unless otherwise stated in the history of present illness.  Patient Active Problem List   Diagnosis Date Noted  . Syncope 12/22/2019  . Kidney hematoma without rupture of capsule, without open wound into cavity 12/22/2019  . Acute urinary retention 12/22/2019  . SOB (shortness of breath) 11/26/2018  . Obesity (BMI 30-39.9) 11/25/2018  . Obstructive sleep apnea 09/01/2013  . Pure hypercholesterolemia 09/01/2013  . Coronary atherosclerosis of native coronary artery 09/01/2013  . Diastolic dysfunction [id number]  . Essential hypertension, benign 09/01/2013  . Left Achilles tendinitis 01/23/2013    No current facility-administered medications on file prior to encounter.    Current Outpatient Medications on File Prior to Encounter  Medication Sig Dispense Refill  . aspirin 81 MG tablet Take 81 mg by mouth daily.    Marland Kitchen atorvastatin (LIPITOR) 40 MG tablet Take 1 tablet (40 mg total) by mouth daily. 90 tablet 2  . CALCIUM PO Take 800 mg by mouth daily.    . Cholecalciferol (VITAMIN D3 PO) Take 2,000 Units by mouth daily.    . clonazePAM (KLONOPIN) 0.5 MG tablet Take 0.5 mg by mouth daily.   2  . desvenlafaxine (PRISTIQ) 50 MG 24 hr tablet Take 50 mg by mouth daily.    Marland Kitchen esomeprazole (NEXIUM) 40 MG capsule Take 1 capsule (40 mg total) by mouth 2 (two) times daily before a meal. (Patient taking differently: Take 40 mg by mouth as needed. ) 90 capsule 3  . ezetimibe (ZETIA) 10 MG tablet Take 1 tablet (10 mg total) by mouth daily. 90 tablet 2  . FERROUS SULFATE PO Take 65 mg by mouth daily.    Marland Kitchen HYDROmorphone (DILAUDID) 2 MG tablet Take 2 mg by mouth every 6 (six) hours as needed.    Marland Kitchen ibuprofen (ADVIL,MOTRIN) 200 MG tablet Take 400 mg by mouth as needed (pain).     Marland Kitchen lisinopril (ZESTRIL) 20 MG tablet Take 20 mg by mouth daily.     . meloxicam (MOBIC) 15 MG tablet Take 15 mg by mouth as needed.   0  . metFORMIN (GLUCOPHAGE) 500 MG tablet Take 500 mg by mouth 2 (two) times daily with a meal.     . Multiple Vitamins-Minerals (PRESERVISION AREDS 2 PO) Take 1 capsule by mouth daily.    Marland Kitchen  NONFORMULARY OR COMPOUNDED ITEM Apply topically 2 (two) times daily. Salicylic Acid [id number], Urea [id number] Cream, apply to the affected area once or twice daily    . Omega-3 Fatty Acids (FISH OIL) 1200 MG CAPS Take 3 capsules by mouth daily.    . tamsulosin (FLOMAX) 0.4 MG CAPS capsule Take 0.4 mg by mouth daily.    . traZODone (DESYREL) 50 MG tablet Take 100 mg by mouth at bedtime.     Marland Kitchen ammonium lactate (LAC-HYDRIN) 12 % cream Apply topically as needed for dry skin. (Patient not taking: Reported on 12/22/2019) 385 g 3  . traMADol (ULTRAM) 50 MG tablet Take 1-2 tablets (50-100 mg total) by mouth  every 6 (six) hours as needed for moderate pain. (Patient not taking: Reported on 12/22/2019) 15 tablet 0    Past Medical History:  Diagnosis Date  . Arthritis   . Chronic fatigue    and weakness  . Colon polyps   . Coronary artery disease    nonobstructive with 20% OM by cath 2006  . Depression   . Diabetes mellitus without complication ([place])   . Diastolic dysfunction    w Elevated LVEDP  . DJD (degenerative joint disease)    C3/4 Dr Sherwood Gambler  . GERD (gastroesophageal reflux disease)   . H/O hiatal hernia   . High cholesterol   . Hypertension   . Hypoglycemia   . Iron deficiency anemia   . Kidney stones   . Multiple thyroid nodules   . Neurocardiogenic syncope   . Obesity   . Prediabetes 2011 & 2012  . Right knee pain 12/2009   Dr Marlou Sa  . Sleep apnea    CPAP had Sleep Study ordered by Dr. Radford Pax  . Tick bite 11/2016  . Vasovagal syncope     Past Surgical History:  Procedure Laterality Date  . ACHILLES TENDON SURGERY Left 01/23/2013   Procedure: EXCISION PARTIAL BONE TALUS/CALCANEUS, REPAIR RUPTURE ACHILLES TENDON PRIMARY OPEN ;  Surgeon: Ninetta Lights, MD;  Location: [facility];  Service: Orthopedics;  Laterality: Left;  . ANTERIOR FUSION CERVICAL SPINE     x2-cervical  . BREAST BIOPSY Right 2007  . BREAST CYST EXCISION Left   . CARDIAC CATHETERIZATION  ~ 2008   Nonobstructive ASCAD, 20% OM  . CARPAL TUNNEL RELEASE Left   . EXTRACORPOREAL SHOCK WAVE LITHOTRIPSY Left 12/22/2019   Procedure: LEFT EXTRACORPOREAL SHOCK WAVE LITHOTRIPSY (ESWL);  Surgeon: Ardis Hughs, MD;  Location: [facility];  Service: Urology;  Laterality: Left;  . LITHOTRIPSY    . OTHER SURGICAL HISTORY  06/12/2018   Left big toe nail surgery. [facility]   . TONSILLECTOMY    . TOTAL KNEE ARTHROPLASTY Right 11/2010  . TOTAL KNEE ARTHROPLASTY  12/13/2011   Procedure: TOTAL KNEE ARTHROPLASTY;  Surgeon: Ninetta Lights, MD;  Location: [facility];  Service:  Orthopedics;  Laterality: Left;  DR MURPHY WANTS 90 MINUTES FOR THIS CASE    Social History   Tobacco Use  . Smoking status: Never Smoker  . Smokeless tobacco: Never Used  Substance Use Topics  . Alcohol use: Yes    Alcohol/week: 6.0 standard drinks    Types: 6 Glasses of wine per week    Comment: couple times per week.  . Drug use: No    Family History  Problem Relation Age of Onset  . CAD Father   . Cancer - Prostate Father   . Heart disease Father   . Diverticulosis  Father   . Hypertension Sister   . Diverticulosis Sister   . Breast cancer Sister   . CVA Mother   . Diverticulosis Mother   . Kidney disease Mother   . Heart disease Paternal Grandfather   . Crohn's disease Paternal Aunt   . Cancer Paternal Aunt        x 2, type unknown  . Colon cancer Other        maternal great Aunt    PE: Vitals:   12/23/19 0123 12/23/19 0130 12/23/19 0625 12/23/19 0911  BP: (!) 148/76  129/76 129/63  Pulse: 84  83 83  Resp: 20  18 16   Temp: 98.8 F (37.1 C)  98.9 F (37.2 C) 98.7 F (37.1 C)  TempSrc:   Oral Oral  SpO2: 94%  97% 98%  Weight:  104.8 kg    Height:  5\' 6"  (1.676 m)     Patient appears to be in no acute distress  patient is alert and oriented x3 Atraumatic normocephalic head No cervical or supraclavicular lymphadenopathy appreciated No increased work of breathing, no audible wheezes/rhonchi Regular sinus rhythm/rate Abdomen is soft, nontender, nondistended, left-sided CVA tenderness Foley catheter draining clear yellow urine Lower extremities are symmetric without appreciable edema Grossly neurologically intact No identifiable skin lesions  Recent Labs    12/22/19 1845 12/23/19 0138  WBC 8.9 7.2  HGB 11.2* 10.5*  HCT 35.3* 33.7*   Recent Labs    12/22/19 1845 12/23/19 0138  NA 140 139  K 3.9 4.1  CL 103 104  CO2 27 25  GLUCOSE 145* 122*  BUN 19 16  CREATININE 1.02* 1.17*  CALCIUM 9.8 9.7   No results for input(s): LABPT, INR in the  last 72 hours. No results for input(s): LABURIN in the last 72 hours. Results for orders placed or performed during the hospital encounter of 12/22/19  SARS Coronavirus 2 by RT PCR (hospital order, performed in [facility] hospital lab) Nasopharyngeal Nasopharyngeal Swab     Status: None   Collection Time: 12/23/19 12:05 AM   Specimen: Nasopharyngeal Swab  Result Value Ref Range Status   SARS Coronavirus 2 NEGATIVE NEGATIVE Final    Comment: (NOTE) SARS-CoV-2 target nucleic acids are NOT DETECTED. The SARS-CoV-2 RNA is generally detectable in upper and lower respiratory specimens during the acute phase of infection. The lowest concentration of SARS-CoV-2 viral copies this assay can detect is 250 copies / mL. A negative result does not preclude SARS-CoV-2 infection and should not be used as the sole basis for treatment or other patient management decisions.  A negative result may occur with improper specimen collection / handling, submission of specimen other than nasopharyngeal swab, presence of viral mutation(s) within the areas targeted by this assay, and inadequate number of viral copies (<250 copies / mL). A negative result must be combined with clinical observations, patient history, and epidemiological information. Fact Sheet for Patients:   StrictlyIdeas.no Fact Sheet for Healthcare Providers: BankingDealers.co.za This test is not yet approved or cleared  by the [place] FDA and has been authorized for detection and/or diagnosis of SARS-CoV-2 by FDA under an Emergency Use Authorization (EUA).  This EUA will remain in effect (meaning this test can be used) for the duration of the COVID-19 declaration under Section 564(b)(1) of the Act, 21 U.S.C. section 360bbb-3(b)(1), unless the authorization is terminated or revoked sooner. Performed at [facility], [place] [address], [place], [place] [address]      Imaging: Independently reviewed  the patient's CT scan which demonstrates a large stone within the left UPJ that has appeared to be elongated from the previous CT scan several days prior.  There is a subcapsular hematoma.  Imp: The patient is admitted with poorly controlled pain and the presyncopal event.  Her pain is related to the subcapsular hematoma in conjunction with a stone that appears to be relatively unchanged.  However, I suspect that the subcapsular hematoma into the collecting system has formed clot around the stone which is keeping it together as it does appear to have elongated from the previous CT scan indicating that it has indeed been to the entire much smaller pieces.  Once the clot dissolves, stone may fall apart and passed.  Recommendations: Given the size of the patient's stone and the uncertainty associated with whether it is been fragmented or not, and her subcapsular hematoma, recommended that we place a stent tonight which will ensure her safety and pain control.  We will then have her follow-up in our clinic in 10 days with a KUB prior to reevaluate whether the stone has fragmented and passed or remains as 1 solid piece.  I discussed the procedure of cystoscopy, retrograde pyelogram and left ureteral stent placement with the patient and her sister, and they have opted to proceed.  Ardis Hughs

## 2019-12-23 NOTE — Anesthesia Postprocedure Evaluation (Signed)
Anesthesia Post Note  Patient: [name]  Procedure(s) Performed: CYSTOSCOPY WITH RETROGRADE PYELOGRAM/URETERAL STENT PLACEMENT (Left Ureter)     Patient location during evaluation: PACU Anesthesia Type: General Level of consciousness: sedated Pain management: pain level controlled Vital Signs Assessment: post-procedure vital signs reviewed and stable Respiratory status: spontaneous breathing and respiratory function stable Cardiovascular status: stable Postop Assessment: no apparent nausea or vomiting Anesthetic complications: no    Last Vitals:  Vitals:   12/23/19 1740 12/23/19 1745  BP: (!) 110/55 (!) 108/52  Pulse: 99 (!) 101  Resp: 13 19  Temp: 37.4 C   SpO2: 100% 100%    Last Pain:  Vitals:   12/23/19 1740  TempSrc:   PainSc: 0-No pain                 Victoriana Aziz DANIEL

## 2019-12-23 NOTE — Telephone Encounter (Signed)
OK to hold ASA for procedure

## 2019-12-23 NOTE — Progress Notes (Signed)
PROGRESS NOTE    [name]  [id number] DOB: February 22, 1951 DOA: 12/22/2019 PCP: Kathyrn Lass, MD   Brief Narrative:  HPI per Dr. Ileene Musa on 12/22/19 [name] is a 69 y.o. female with medical history significant for CAD, hypertension, hyperlipidemia, OSA on CPAP, morbid obesity, nephrolithiasis who presents with concerns of syncope and urinary retention.  Patient had an elective outpatient ESWL today for a left renal stone with urology.  Upon returning home she reports feeling much worse.  Had worsening pain to her left lower back and left lower quadrant abdomen.  She had taken docusate yesterday and today had 2 bowel movements but has not had urine output since 7 AM this morning.  She also felt nauseous today and try to get to the bathroom but had loss of consciousness and woke up on her back.  Fall was unwitnessed but sister heard her fall and another roommate came to check on her.  She initially awoke and was confused.  She reports that later in the day she again felt nauseous while in bed and passed out in the bed.  Both times she has felt very dizzy suddenly.  Patient reports that she has had syncope associated with her nausea many years ago and has had a work-up with no findings.  She denies any chest pain or shortness of breath with her syncope.  No fevers.  Labs notable no leukocytosis, mild anemia with Hgb of 11.2. Creatinine of 1.02.   CT renal stone search shows interval development of mild to moderate size left renal subcapsular hematoma and 4mm calculus in left renal pelvis.   ED physician Dr. Roslynn Amble spoke with urology and no emergent procedures at this time. They recommend repeat Hgb in the morning and they will see her in consultation in the morning   She was given 1L of NS fluid in ED  **Interim History She is feeling a little bit better but ended up on oxygen.  Urology to see her this morning.  We will resume IV fluid maintenance exam 5 MLS per hour.   Pain is better controlled today.  Foley was placed last night.  Assessment & Plan:   Principal Problem:   Syncope Active Problems:   Obstructive sleep apnea   Pure hypercholesterolemia   Coronary atherosclerosis of native coronary artery   Essential hypertension, benign   Obesity (BMI 30-39.9)   Kidney hematoma without rupture of capsule, without open wound into cavity   Acute urinary retention  Post ESWL complication/left renal subcapsular hematoma -POD 1of elective ESWL with urology yesterday by Dr. Louis Meckel -CT renal stone study showed "Interval development of mild to moderate size left renal subcapsular hematoma. Left nephrolithiasis, including 15 mm calculus in left renal pelvis. No significant hydronephrosis is noted" -Urology advised monitoring hemoglobin which is stable at this time. They are to consult this morning and Dr. Louis Meckel recommending NPO given that she likely needs a stent  -Will repeat CBC in the morning -Continue as needed p.o. Dilaudid 2 mg q6hprn for Moderate Pain and IV Fentanyl 25 mcg for severe pain -Renal Fxn showed that BUN/Cr went from 19/1.02 -> 16/1.17 -Urinalysis done and showed a hazy appearance with amber color urine, large hemoglobin, negative leukocytes, negative nitrites, rare bacteria, greater than 50 RBCs per high-power field and 21-50 WBCs  Acute Respiratory Failure with Hypoxia -Patient does not wear O2 but was wearing it when I walked in the room -Check CXR -SpO2: 98 %  -Continue Supplemental O2 via [place]  and wean O2 a Tolerated -Continuous Pulse Oximetry and Maintain O2 Sats >92% -Continue to Monitor Respiratory Status carefully  -Will need an Ambulatory Home O2 Screen prior to D/C   Nephrolithiasis -19mm calculus remains in left renal pelvis without obstruction -Continue PRN pain management -Urology to consult this AM and awaiting to see the patient; I spoke with Urology and Dr. Louis Meckel to see the patient and he recommends making her NPO as  he feel as if she will need stenting -C/w NS at 75 mL/hr   Normocytic Anemia -Patient's Hgb/Hct went from 11.2/35.3 -> 10.5/33.7 -Check Anemia Panel in the AM -Continue to Monitor for S/Sx of Bleeding; Currently no overt bleeding noted -Repeat CBC  Syncope -Nausea. Likely vasovagal -Will monitor on telemetry.   -Has received 1 L of fluid in the ED. Will start Maintenance IVF with NS at 75 mL/hr -Check Orthostatic VS and obtain PT/OT  Acute Urinary Retention -Bladder scan showing 66 mL and was placed on pure wick.   -However continues to not be able to urinate.   -Will repeat bladder scan and pending results will place Foley -Continue Tamsulosin 0.4 mg po daily   OSA -Continue CPAP  History of CAD -C/w ASA 81 mg po Daily   Hypertension -Continue Lisinopril 20 mg po Daily   Hyperlipidemia -C/w Atorvastatin 40 mg po Daily and Ezetimibe 10 mg po Daily   Obesity -Estimated body mass index is 37.29 kg/m as calculated from the following:   Height as of this encounter: 5\' 6"  (1.676 m).   Weight as of this encounter: 104.8 kg. -Weight Loss and Dietary Counseling given   Hyperglycemia -Patient's Blood sugar ranging from 122-145 on BMP/CMP and CBG's ranging from 83-105 -Check HgbA1c in the AM -Continue to Monitor Blood Sugars carefully and if necessary will place on Sensitive Novolog SSI AC  DVT prophylaxis: SCDs Code Status: FULL CODE  Family Communication: No family present at bedside  Disposition Plan: Pending further improvement and Evaluation by PT/OT for safe discharge disposition  Status is: Inpatient  Remains inpatient appropriate because:Unsafe d/c plan and IV treatments appropriate due to intensity of illness or inability to take PO   Dispo: The patient is from: Home              Anticipated d/c is to: Home              Anticipated d/c date is: 2 days              Patient currently is not medically stable to d/c.  Consultants:    Urology  Procedures:  None  Antimicrobials:  Anti-infectives (From admission, onward)   None     Subjective: Seen and examined at bedside and states her abdominal pain is less today.  Denies any nausea or vomiting.  Feels a little bit better but still not great.  Wearing supplemental oxygen and states that she does not wear oxygen.  Denies any chest pain.  Denies any other complaints or concerns at this time.  Objective: Vitals:   12/23/19 0045 12/23/19 0123 12/23/19 0130 12/23/19 0625  BP: (!) 141/85 (!) 148/76  129/76  Pulse: 84 84  83  Resp: 14 20  18   Temp:  98.8 F (37.1 C)  98.9 F (37.2 C)  TempSrc:    Oral  SpO2: 92% 94%  97%  Weight:   104.8 kg   Height:   5\' 6"  (1.676 m)     Intake/Output Summary (Last 24 hours) at 12/23/2019  [id number] Last data filed at 12/22/2019 2326 Gross per 24 hour  Intake 1000 ml  Output --  Net 1000 ml   Filed Weights   12/22/19 1755 12/23/19 0130  Weight: 103 kg 104.8 kg   Examination: Physical Exam:  Constitutional: WN/WD obese Caucasian female currently in NAD and appears calm but somewhat uncomfortable Eyes: PERRL, lids and conjunctivae normal, sclerae anicteric  ENMT: External Ears, Nose appear normal. Grossly normal hearing. Mucous membranes are moist. Posterior pharynx clear of any exudate or lesions. Normal dentition.  Neck: Appears normal, supple, no cervical masses, normal ROM, no appreciable thyromegaly Respiratory: Diminished to auscultation bilaterally with coarse breath, no wheezing, rales, rhonchi or crackles. Normal respiratory effort and patient is not tachypenic. No accessory muscle use.  Unlabored breathing but she is wearing supplemental oxygen via nasal cannula Cardiovascular: RRR, no murmurs / rubs / gallops. S1 and S2 auscultated.  Minimal extremity edema Abdomen: Soft, mildly tender tender, distended secondary body habitus. Bowel sounds positive.  GU: Deferred.  Has a Foley catheter now Musculoskeletal: No  clubbing / cyanosis of digits/nails. No joint deformity upper and lower extremities.  Skin: No rashes, lesions, ulcers on limited skin evaluation. No induration; Warm and dry.  Neurologic: CN 2-12 grossly intact with no focal deficits. Romberg sign and cerebellar reflexes not assessed.  Psychiatric: Normal judgment and insight. Alert and oriented x 3. Normal mood and appropriate affect.   Data Reviewed: I have personally reviewed following labs and imaging studies  CBC: Recent Labs  Lab 12/22/19 1845 12/23/19 0138  WBC 8.9 7.2  NEUTROABS 8.0*  --   HGB 11.2* 10.5*  HCT 35.3* 33.7*  MCV 89.1 88.7  PLT 188 0000000   Basic Metabolic Panel: Recent Labs  Lab 12/22/19 1845 12/23/19 0138  NA 140 139  K 3.9 4.1  CL 103 104  CO2 27 25  GLUCOSE 145* 122*  BUN 19 16  CREATININE 1.02* 1.17*  CALCIUM 9.8 9.7   GFR: Estimated Creatinine Clearance: 55.5 mL/min (A) (by C-G formula based on SCr of 1.17 mg/dL (H)). Liver Function Tests: Recent Labs  Lab 12/18/19 1056 12/22/19 1845  AST  --  20  ALT 23 25  ALKPHOS  --  68  BILITOT  --  0.3  PROT  --  6.8  ALBUMIN  --  4.0   No results for input(s): LIPASE, AMYLASE in the last 168 hours. No results for input(s): AMMONIA in the last 168 hours. Coagulation Profile: No results for input(s): INR, PROTIME in the last 168 hours. Cardiac Enzymes: No results for input(s): CKTOTAL, CKMB, CKMBINDEX, TROPONINI in the last 168 hours. BNP (last 3 results) No results for input(s): PROBNP in the last 8760 hours. HbA1C: No results for input(s): HGBA1C in the last 72 hours. CBG: Recent Labs  Lab 12/22/19 0832 12/22/19 1132  GLUCAP 105* 83   Lipid Profile: No results for input(s): CHOL, HDL, LDLCALC, TRIG, CHOLHDL, LDLDIRECT in the last 72 hours. Thyroid Function Tests: No results for input(s): TSH, T4TOTAL, FREET4, T3FREE, THYROIDAB in the last 72 hours. Anemia Panel: No results for input(s): VITAMINB12, FOLATE, FERRITIN, TIBC, IRON,  RETICCTPCT in the last 72 hours. Sepsis Labs: No results for input(s): PROCALCITON, LATICACIDVEN in the last 168 hours.  Recent Results (from the past 240 hour(s))  SARS CORONAVIRUS 2 (TAT 6-24 HRS) Nasopharyngeal Nasopharyngeal Swab     Status: None   Collection Time: 12/19/19 12:59 PM   Specimen: Nasopharyngeal Swab  Result Value Ref Range Status   SARS  Coronavirus 2 NEGATIVE NEGATIVE Final    Comment: (NOTE) SARS-CoV-2 target nucleic acids are NOT DETECTED. The SARS-CoV-2 RNA is generally detectable in upper and lower respiratory specimens during the acute phase of infection. Negative results do not preclude SARS-CoV-2 infection, do not rule out co-infections with other pathogens, and should not be used as the sole basis for treatment or other patient management decisions. Negative results must be combined with clinical observations, patient history, and epidemiological information. The expected result is Negative. Fact Sheet for Patients: SugarRoll.be Fact Sheet for Healthcare Providers: https://www.woods-mathews.com/ This test is not yet approved or cleared by the [place] FDA and  has been authorized for detection and/or diagnosis of SARS-CoV-2 by FDA under an Emergency Use Authorization (EUA). This EUA will remain  in effect (meaning this test can be used) for the duration of the COVID-19 declaration under Section 56 4(b)(1) of the Act, 21 U.S.C. section 360bbb-3(b)(1), unless the authorization is terminated or revoked sooner. Performed at [facility], [place] [address], [place], [place] [address]   SARS Coronavirus 2 by RT PCR (hospital order, performed in [facility] hospital lab) Nasopharyngeal Nasopharyngeal Swab     Status: None   Collection Time: 12/23/19 12:05 AM   Specimen: Nasopharyngeal Swab  Result Value Ref Range Status   SARS Coronavirus 2 NEGATIVE NEGATIVE Final    Comment: (NOTE) SARS-CoV-2 target nucleic  acids are NOT DETECTED. The SARS-CoV-2 RNA is generally detectable in upper and lower respiratory specimens during the acute phase of infection. The lowest concentration of SARS-CoV-2 viral copies this assay can detect is 250 copies / mL. A negative result does not preclude SARS-CoV-2 infection and should not be used as the sole basis for treatment or other patient management decisions.  A negative result may occur with improper specimen collection / handling, submission of specimen other than nasopharyngeal swab, presence of viral mutation(s) within the areas targeted by this assay, and inadequate number of viral copies (<250 copies / mL). A negative result must be combined with clinical observations, patient history, and epidemiological information. Fact Sheet for Patients:   StrictlyIdeas.no Fact Sheet for Healthcare Providers: BankingDealers.co.za This test is not yet approved or cleared  by the [place] FDA and has been authorized for detection and/or diagnosis of SARS-CoV-2 by FDA under an Emergency Use Authorization (EUA).  This EUA will remain in effect (meaning this test can be used) for the duration of the COVID-19 declaration under Section 564(b)(1) of the Act, 21 U.S.C. section 360bbb-3(b)(1), unless the authorization is terminated or revoked sooner. Performed at [facility], [place] [address], [place], [place] [address]     RN Pressure Injury Documentation:     Estimated body mass index is 37.29 kg/m as calculated from the following:   Height as of this encounter: 5\' 6"  (1.676 m).   Weight as of this encounter: 104.8 kg.  Malnutrition Type:      Malnutrition Characteristics:      Nutrition Interventions:    Radiology Studies: DG Abd 1 View  Result Date: 12/22/2019 CLINICAL DATA:  Preop evaluation for upcoming lithotripsy EXAM: ABDOMEN - 1 VIEW COMPARISON:  12/17/2019 FINDINGS: Scattered  large and small bowel gas is noted. Mild retained fecal material is seen. Stable left renal calculi are noted most prominent in the renal pelvis again measuring approximately 16 mm in greatest dimension. Degenerative changes of lumbar spine are seen. IMPRESSION: Mild constipation. Stable left renal calculi similar to that seen on prior CT. Electronically Signed  By: Inez Catalina M.D.   On: 12/22/2019 07:57   CT Renal Stone Study  Result Date: 12/22/2019 CLINICAL DATA:  Left flank pain status post lithotripsy today. EXAM: CT ABDOMEN AND PELVIS WITHOUT CONTRAST TECHNIQUE: Multidetector CT imaging of the abdomen and pelvis was performed following the standard protocol without IV contrast. COMPARISON:  Dec 17, 2019. FINDINGS: Lower chest: No acute abnormality. Hepatobiliary: No focal liver abnormality is seen. No gallstones, gallbladder wall thickening, or biliary dilatation. Pancreas: Unremarkable. No pancreatic ductal dilatation or surrounding inflammatory changes. Spleen: Normal in size without focal abnormality. Adrenals/Urinary Tract: Adrenal glands appear normal. Stable large simple cyst is seen arising from upper pole of right kidney. Right kidney and ureter are unremarkable. Urinary bladder is unremarkable. Left nephrolithiasis is noted, including 15 mm calculus in left renal pelvis. No significant hydronephrosis is noted. However, there is the interval development of mild to moderate size left renal subcapsular hematoma. Stomach/Bowel: Stomach is within normal limits. Appendix appears normal. No evidence of bowel wall thickening, distention, or inflammatory changes. Vascular/Lymphatic: Aortic atherosclerosis. No enlarged abdominal or pelvic lymph nodes. Reproductive: Uterus and bilateral adnexa are unremarkable. Other: No abdominal wall hernia or abnormality. No abdominopelvic ascites. Musculoskeletal: No acute or significant osseous findings. IMPRESSION: 1. Interval development of mild to moderate size  left renal subcapsular hematoma. 2. Left nephrolithiasis, including 15 mm calculus in left renal pelvis. No significant hydronephrosis is noted. Aortic Atherosclerosis (ICD10-I70.0). Electronically Signed   By: Marijo Conception M.D.   On: 12/22/2019 20:17   Scheduled Meds: . aspirin EC  81 mg Oral Daily  . atorvastatin  40 mg Oral Daily  . calcium carbonate  1 tablet Oral Daily  . clonazePAM  0.5 mg Oral Daily  . ezetimibe  10 mg Oral Daily  . lisinopril  20 mg Oral Daily  . tamsulosin  0.4 mg Oral Daily  . traZODone  100 mg Oral QHS  . venlafaxine XR  75 mg Oral Q breakfast   Continuous Infusions:   LOS: 1 day   Kerney Elbe, DO [facility] PAGER is on [place]  If 7PM-7AM, please contact night-coverage [email or web address]

## 2019-12-23 NOTE — Discharge Instructions (Signed)
DISCHARGE INSTRUCTIONS FOR KIDNEY STONE/URETERAL STENT   MEDICATIONS:  1.  Resume all your other meds from home - except do not take any extra narcotic pain meds that you may have at home.  2. Pyridium is to help with the burning/stinging when you urinate. 3. Tramadol is for moderate/severe pain, otherwise taking upto 1000 mg every 6 hours of plainTylenol will help treat your pain.    ACTIVITY:  1. No strenuous activity x 1week  2. No driving while on narcotic pain medications  3. Drink plenty of water  4. Continue to walk at home - you can still get blood clots when you are at home, so keep active, but don't over do it.  5. May return to work/school tomorrow or when you feel ready   BATHING:  1. You can shower and we recommend daily showers   SIGNS/SYMPTOMS TO CALL:  Please call us if you have a fever greater than 101.5, uncontrolled nausea/vomiting, uncontrolled pain, dizziness, unable to urinate, bloody urine, chest pain, shortness of breath, leg swelling, leg pain, redness around wound, drainage from wound, or any other concerns or questions.   You can reach [name] at [phone].   FOLLOW-UP:  1. You have an appointment in 2 weeks for re-evaluation.

## 2019-12-23 NOTE — Consult Note (Signed)
I have been asked to see the patient by Dr. Raiford Noble, for evaluation and management of left obstructing stone, left subcapsular hematoma.  History of present illness: 69 year old female who is status post left shockwave lithotripsy for a large left UPJ stone.  Following the procedures was discharged home and subsequently developed worsening left-sided flank pain with associated nausea.  Patient then had a presyncopal event falling in her bathroom.  She then called 911 when she was taken to the emergency department.  In the emergency department the patient was noted to have urinary retention and severe left-sided flank pain.  A CT scan was performed showing that she had left-sided proximal hydronephrosis with a persistent large left-sided UPJ stone and a subcapsular hematoma.Foley catheter was placed because she was unable to provide a urine specimen had not voided all day.  Her urine was noted to be bloody.  Today, the patient appears to be doing much better.  Her pain has been fairly well controlled since she was in the emergency department.  She had associated nausea and vomiting.  She has not had any fevers.  Vital signs are stable.  Review of systems: A 12 point comprehensive review of systems was obtained and is negative unless otherwise stated in the history of present illness.  Patient Active Problem List   Diagnosis Date Noted  . Syncope 12/22/2019  . Kidney hematoma without rupture of capsule, without open wound into cavity 12/22/2019  . Acute urinary retention 12/22/2019  . SOB (shortness of breath) 11/26/2018  . Obesity (BMI 30-39.9) 11/25/2018  . Obstructive sleep apnea 09/01/2013  . Pure hypercholesterolemia 09/01/2013  . Coronary atherosclerosis of native coronary artery 09/01/2013  . Diastolic dysfunction [id number]  . Essential hypertension, benign 09/01/2013  . Left Achilles tendinitis 01/23/2013    No current facility-administered medications on file prior to encounter.    Current Outpatient Medications on File Prior to Encounter  Medication Sig Dispense Refill  . aspirin 81 MG tablet Take 81 mg by mouth daily.    Marland Kitchen atorvastatin (LIPITOR) 40 MG tablet Take 1 tablet (40 mg total) by mouth daily. 90 tablet 2  . CALCIUM PO Take 800 mg by mouth daily.    . Cholecalciferol (VITAMIN D3 PO) Take 2,000 Units by mouth daily.    . clonazePAM (KLONOPIN) 0.5 MG tablet Take 0.5 mg by mouth daily.   2  . desvenlafaxine (PRISTIQ) 50 MG 24 hr tablet Take 50 mg by mouth daily.    Marland Kitchen esomeprazole (NEXIUM) 40 MG capsule Take 1 capsule (40 mg total) by mouth 2 (two) times daily before a meal. (Patient taking differently: Take 40 mg by mouth as needed. ) 90 capsule 3  . ezetimibe (ZETIA) 10 MG tablet Take 1 tablet (10 mg total) by mouth daily. 90 tablet 2  . FERROUS SULFATE PO Take 65 mg by mouth daily.    Marland Kitchen HYDROmorphone (DILAUDID) 2 MG tablet Take 2 mg by mouth every 6 (six) hours as needed.    Marland Kitchen ibuprofen (ADVIL,MOTRIN) 200 MG tablet Take 400 mg by mouth as needed (pain).     Marland Kitchen lisinopril (ZESTRIL) 20 MG tablet Take 20 mg by mouth daily.     . meloxicam (MOBIC) 15 MG tablet Take 15 mg by mouth as needed.   0  . metFORMIN (GLUCOPHAGE) 500 MG tablet Take 500 mg by mouth 2 (two) times daily with a meal.     . Multiple Vitamins-Minerals (PRESERVISION AREDS 2 PO) Take 1 capsule by mouth daily.    Marland Kitchen  NONFORMULARY OR COMPOUNDED ITEM Apply topically 2 (two) times daily. Salicylic Acid [id number], Urea [id number] Cream, apply to the affected area once or twice daily    . Omega-3 Fatty Acids (FISH OIL) 1200 MG CAPS Take 3 capsules by mouth daily.    . tamsulosin (FLOMAX) 0.4 MG CAPS capsule Take 0.4 mg by mouth daily.    . traZODone (DESYREL) 50 MG tablet Take 100 mg by mouth at bedtime.     Marland Kitchen ammonium lactate (LAC-HYDRIN) 12 % cream Apply topically as needed for dry skin. (Patient not taking: Reported on 12/22/2019) 385 g 3  . traMADol (ULTRAM) 50 MG tablet Take 1-2 tablets (50-100 mg total) by mouth  every 6 (six) hours as needed for moderate pain. (Patient not taking: Reported on 12/22/2019) 15 tablet 0    Past Medical History:  Diagnosis Date  . Arthritis   . Chronic fatigue    and weakness  . Colon polyps   . Coronary artery disease    nonobstructive with 20% OM by cath 2006  . Depression   . Diabetes mellitus without complication ([place])   . Diastolic dysfunction    w Elevated LVEDP  . DJD (degenerative joint disease)    C3/4 Dr Sherwood Gambler  . GERD (gastroesophageal reflux disease)   . H/O hiatal hernia   . High cholesterol   . Hypertension   . Hypoglycemia   . Iron deficiency anemia   . Kidney stones   . Multiple thyroid nodules   . Neurocardiogenic syncope   . Obesity   . Prediabetes 2011 & 2012  . Right knee pain 12/2009   Dr Marlou Sa  . Sleep apnea    CPAP had Sleep Study ordered by Dr. Radford Pax  . Tick bite 11/2016  . Vasovagal syncope     Past Surgical History:  Procedure Laterality Date  . ACHILLES TENDON SURGERY Left 01/23/2013   Procedure: EXCISION PARTIAL BONE TALUS/CALCANEUS, REPAIR RUPTURE ACHILLES TENDON PRIMARY OPEN ;  Surgeon: Ninetta Lights, MD;  Location: [place];  Service: Orthopedics;  Laterality: Left;  . ANTERIOR FUSION CERVICAL SPINE     x2-cervical  . BREAST BIOPSY Right 2007  . BREAST CYST EXCISION Left   . CARDIAC CATHETERIZATION  ~ 2008   Nonobstructive ASCAD, 20% OM  . CARPAL TUNNEL RELEASE Left   . EXTRACORPOREAL SHOCK WAVE LITHOTRIPSY Left 12/22/2019   Procedure: LEFT EXTRACORPOREAL SHOCK WAVE LITHOTRIPSY (ESWL);  Surgeon: Ardis Hughs, MD;  Location[facility];  Service: Urology;  Laterality: Left;  . LITHOTRIPSY    . OTHER SURGICAL HISTORY  06/12/2018   Left big toe nail surgery. [facility]   . TONSILLECTOMY    . TOTAL KNEE ARTHROPLASTY Right 11/2010  . TOTAL KNEE ARTHROPLASTY  12/13/2011   Procedure: TOTAL KNEE ARTHROPLASTY;  Surgeon: Ninetta Lights, MD;  Location: [place];  Service:  Orthopedics;  Laterality: Left;  DR MURPHY WANTS 90 MINUTES FOR THIS CASE    Social History   Tobacco Use  . Smoking status: Never Smoker  . Smokeless tobacco: Never Used  Substance Use Topics  . Alcohol use: Yes    Alcohol/week: 6.0 standard drinks    Types: 6 Glasses of wine per week    Comment: couple times per week.  . Drug use: No    Family History  Problem Relation Age of Onset  . CAD Father   . Cancer - Prostate Father   . Heart disease Father   . Diverticulosis  Father   . Hypertension Sister   . Diverticulosis Sister   . Breast cancer Sister   . CVA Mother   . Diverticulosis Mother   . Kidney disease Mother   . Heart disease Paternal Grandfather   . Crohn's disease Paternal Aunt   . Cancer Paternal Aunt        x 2, type unknown  . Colon cancer Other        maternal great Aunt    PE: Vitals:   12/23/19 0123 12/23/19 0130 12/23/19 0625 12/23/19 0911  BP: (!) 148/76  129/76 129/63  Pulse: 84  83 83  Resp: 20  18 16   Temp: 98.8 F (37.1 C)  98.9 F (37.2 C) 98.7 F (37.1 C)  TempSrc:   Oral Oral  SpO2: 94%  97% 98%  Weight:  104.8 kg    Height:  5\' 6"  (1.676 m)     Patient appears to be in no acute distress  patient is alert and oriented x3 Atraumatic normocephalic head No cervical or supraclavicular lymphadenopathy appreciated No increased work of breathing, no audible wheezes/rhonchi Regular sinus rhythm/rate Abdomen is soft, nontender, nondistended, left-sided CVA tenderness Foley catheter draining clear yellow urine Lower extremities are symmetric without appreciable edema Grossly neurologically intact No identifiable skin lesions  Recent Labs    12/22/19 1845 12/23/19 0138  WBC 8.9 7.2  HGB 11.2* 10.5*  HCT 35.3* 33.7*   Recent Labs    12/22/19 1845 12/23/19 0138  NA 140 139  K 3.9 4.1  CL 103 104  CO2 27 25  GLUCOSE 145* 122*  BUN 19 16  CREATININE 1.02* 1.17*  CALCIUM 9.8 9.7   No results for input(s): LABPT, INR in the  last 72 hours. No results for input(s): LABURIN in the last 72 hours. Results for orders placed or performed during the hospital encounter of 12/22/19  SARS Coronavirus 2 by RT PCR (hospital order, performed in [facility] hospital lab) Nasopharyngeal Nasopharyngeal Swab     Status: None   Collection Time: 12/23/19 12:05 AM   Specimen: Nasopharyngeal Swab  Result Value Ref Range Status   SARS Coronavirus 2 NEGATIVE NEGATIVE Final    Comment: (NOTE) SARS-CoV-2 target nucleic acids are NOT DETECTED. The SARS-CoV-2 RNA is generally detectable in upper and lower respiratory specimens during the acute phase of infection. The lowest concentration of SARS-CoV-2 viral copies this assay can detect is 250 copies / mL. A negative result does not preclude SARS-CoV-2 infection and should not be used as the sole basis for treatment or other patient management decisions.  A negative result may occur with improper specimen collection / handling, submission of specimen other than nasopharyngeal swab, presence of viral mutation(s) within the areas targeted by this assay, and inadequate number of viral copies (<250 copies / mL). A negative result must be combined with clinical observations, patient history, and epidemiological information. Fact Sheet for Patients:   StrictlyIdeas.no Fact Sheet for Healthcare Providers: BankingDealers.co.za This test is not yet approved or cleared  by the [place] FDA and has been authorized for detection and/or diagnosis of SARS-CoV-2 by FDA under an Emergency Use Authorization (EUA).  This EUA will remain in effect (meaning this test can be used) for the duration of the COVID-19 declaration under Section 564(b)(1) of the Act, 21 U.S.C. section 360bbb-3(b)(1), unless the authorization is terminated or revoked sooner. Performed at [facility], [place] [address] [place], [place] [address]      Imaging: Independently reviewed  the patient's CT scan which demonstrates a large stone within the left UPJ that has appeared to be elongated from the previous CT scan several days prior.  There is a subcapsular hematoma.  Imp: The patient is admitted with poorly controlled pain and the presyncopal event.  Her pain is related to the subcapsular hematoma in conjunction with a stone that appears to be relatively unchanged.  However, I suspect that the subcapsular hematoma into the collecting system has formed clot around the stone which is keeping it together as it does appear to have elongated from the previous CT scan indicating that it has indeed been to the entire much smaller pieces.  Once the clot dissolves, stone may fall apart and passed.  Recommendations: Given the size of the patient's stone and the uncertainty associated with whether it is been fragmented or not, and her subcapsular hematoma, recommended that we place a stent tonight which will ensure her safety and pain control.  We will then have her follow-up in our clinic in 10 days with a KUB prior to reevaluate whether the stone has fragmented and passed or remains as 1 solid piece.  I discussed the procedure of cystoscopy, retrograde pyelogram and left ureteral stent placement with the patient and her sister, and they have opted to proceed.  Ardis Hughs

## 2019-12-23 NOTE — Anesthesia Procedure Notes (Signed)
Procedure Name: LMA Insertion Date/Time: 12/23/2019 5:15 PM Performed by: Mitzie Na, CRNA Pre-anesthesia Checklist: Patient identified, Emergency Drugs available, Suction available and Patient being monitored Patient Re-evaluated:Patient Re-evaluated prior to induction Oxygen Delivery Method: Circle system utilized Preoxygenation: Pre-oxygenation with 100% oxygen Induction Type: IV induction LMA: LMA inserted LMA Size: 4.0 Number of attempts: 1 Placement Confirmation: positive ETCO2 and breath sounds checked- equal and bilateral Tube secured with: Tape Dental Injury: Teeth and Oropharynx as per pre-operative assessment

## 2019-12-24 DIAGNOSIS — N99840 Postprocedural hematoma of a genitourinary system organ or structure following a genitourinary system procedure: Secondary | ICD-10-CM | POA: Diagnosis not present

## 2019-12-24 DIAGNOSIS — R55 Syncope and collapse: Secondary | ICD-10-CM | POA: Diagnosis not present

## 2019-12-24 DIAGNOSIS — E669 Obesity, unspecified: Secondary | ICD-10-CM | POA: Diagnosis not present

## 2019-12-24 DIAGNOSIS — G4733 Obstructive sleep apnea (adult) (pediatric): Secondary | ICD-10-CM | POA: Diagnosis not present

## 2019-12-24 DIAGNOSIS — S37012A Minor contusion of left kidney, initial encounter: Secondary | ICD-10-CM | POA: Diagnosis not present

## 2019-12-24 DIAGNOSIS — E7800 Pure hypercholesterolemia, unspecified: Secondary | ICD-10-CM | POA: Diagnosis not present

## 2019-12-24 DIAGNOSIS — R339 Retention of urine, unspecified: Secondary | ICD-10-CM | POA: Diagnosis not present

## 2019-12-24 DIAGNOSIS — N201 Calculus of ureter: Secondary | ICD-10-CM

## 2019-12-24 LAB — HEMOGLOBIN A1C
Hgb A1c MFr Bld: 6.6 % — ABNORMAL HIGH (ref 4.8–5.6)
Mean Plasma Glucose: 142.72 mg/dL

## 2019-12-24 LAB — FERRITIN: Ferritin: 24 ng/mL (ref 11–307)

## 2019-12-24 LAB — CBC WITH DIFFERENTIAL/PLATELET
Abs Immature Granulocytes: 0.04 10*3/uL (ref 0.00–0.07)
Basophils Absolute: 0 10*3/uL (ref 0.0–0.1)
Basophils Relative: 0 %
Eosinophils Absolute: 0 10*3/uL (ref 0.0–0.5)
Eosinophils Relative: 0 %
HCT: 33 % — ABNORMAL LOW (ref 36.0–46.0)
Hemoglobin: 10.3 g/dL — ABNORMAL LOW (ref 12.0–15.0)
Immature Granulocytes: 1 %
Lymphocytes Relative: 6 %
Lymphs Abs: 0.4 10*3/uL — ABNORMAL LOW (ref 0.7–4.0)
MCH: 27.7 pg (ref 26.0–34.0)
MCHC: 31.2 g/dL (ref 30.0–36.0)
MCV: 88.7 fL (ref 80.0–100.0)
Monocytes Absolute: 0.3 10*3/uL (ref 0.1–1.0)
Monocytes Relative: 4 %
Neutro Abs: 5.9 10*3/uL (ref 1.7–7.7)
Neutrophils Relative %: 89 %
Platelets: 171 10*3/uL (ref 150–400)
RBC: 3.72 MIL/uL — ABNORMAL LOW (ref 3.87–5.11)
RDW: 14.4 % (ref 11.5–15.5)
WBC: 6.6 10*3/uL (ref 4.0–10.5)
nRBC: 0 % (ref 0.0–0.2)

## 2019-12-24 LAB — RETICULOCYTES
Immature Retic Fract: 13.7 % (ref 2.3–15.9)
RBC.: 3.67 MIL/uL — ABNORMAL LOW (ref 3.87–5.11)
Retic Count, Absolute: 40.4 10*3/uL (ref 19.0–186.0)
Retic Ct Pct: 1.1 % (ref 0.4–3.1)

## 2019-12-24 LAB — MAGNESIUM: Magnesium: 1.9 mg/dL (ref 1.7–2.4)

## 2019-12-24 LAB — COMPREHENSIVE METABOLIC PANEL
ALT: 22 U/L (ref 0–44)
AST: 15 U/L (ref 15–41)
Albumin: 3.5 g/dL (ref 3.5–5.0)
Alkaline Phosphatase: 57 U/L (ref 38–126)
Anion gap: 10 (ref 5–15)
BUN: 21 mg/dL (ref 8–23)
CO2: 23 mmol/L (ref 22–32)
Calcium: 9.2 mg/dL (ref 8.9–10.3)
Chloride: 106 mmol/L (ref 98–111)
Creatinine, Ser: 1.41 mg/dL — ABNORMAL HIGH (ref 0.44–1.00)
GFR calc Af Amer: 44 mL/min — ABNORMAL LOW (ref >60)
GFR calc non Af Amer: 38 mL/min — ABNORMAL LOW (ref >60)
Glucose, Bld: 172 mg/dL — ABNORMAL HIGH (ref 70–99)
Potassium: 4.2 mmol/L (ref 3.5–5.1)
Sodium: 139 mmol/L (ref 135–145)
Total Bilirubin: 0.2 mg/dL — ABNORMAL LOW (ref 0.3–1.2)
Total Protein: 6 g/dL — ABNORMAL LOW (ref 6.5–8.1)

## 2019-12-24 LAB — IRON AND TIBC
Iron: 20 ug/dL — ABNORMAL LOW (ref 28–170)
Saturation Ratios: 6 % — ABNORMAL LOW (ref 10.4–31.8)
TIBC: 334 ug/dL (ref 250–450)
UIBC: 314 ug/dL

## 2019-12-24 LAB — FOLATE: Folate: 25.3 ng/mL (ref >5.9)

## 2019-12-24 LAB — VITAMIN B12: Vitamin B-12: 265 pg/mL (ref 180–914)

## 2019-12-24 LAB — PHOSPHORUS: Phosphorus: 3.8 mg/dL (ref 2.5–4.6)

## 2019-12-24 MED ORDER — CYANOCOBALAMIN 1000 MCG PO TABS: 1000.0000 ug | ORAL_TABLET | Freq: Every day | ORAL | 0 refills | Status: AC

## 2019-12-24 MED ORDER — VITAMIN B-12 1000 MCG PO TABS
1000.0000 ug | ORAL_TABLET | Freq: Every day | ORAL | Status: DC
  Administered 2019-12-24: 1000 ug via ORAL
  Filled 2019-12-24: qty 1

## 2019-12-24 MED ORDER — SODIUM CHLORIDE 0.9 % IV SOLN
510.0000 mg | Freq: Once | INTRAVENOUS | Status: AC
  Administered 2019-12-24: 510 mg via INTRAVENOUS
  Filled 2019-12-24: qty 510

## 2019-12-24 NOTE — Progress Notes (Signed)
Patient ambulated 360 feet in hallway with no major complaints of dizziness/lightheadedness. Tolerated well. "Just weak from being in the bed since Monday".  Eulas Post, RN

## 2019-12-24 NOTE — TOC Progression Note (Signed)
Transition of Care [facility]) - Progression Note    Patient Details  Name: [name] MRN: [id number] Date of Birth: 09/13/1950  [facility] of [facility]) CM/SW Contact  Purcell Mouton, RN Phone Number: 12/24/2019, 12:49 PM  Clinical Narrative:    Pt plan to discharge home with no needs.    Expected Discharge Plan: Home/Self Care Barriers to Discharge: No Barriers Identified  Expected Discharge Plan and Services Expected Discharge Plan: Home/Self Care         Expected Discharge Date: 12/24/19                                     Social Determinants of Health (SDOH) Interventions    Readmission Risk Interventions No flowsheet data found.

## 2019-12-24 NOTE — Progress Notes (Signed)
Urology Progress Note   1 Day Post-Op from left ureteral stent placement.   Subjective: NAEON.  Voiding well.  No nausea. Some terminal voiding discomfort related to stent Pain well controlled.  Walking well. Hgb 10.3 from 10.5 post op.  Cr 1.4 from 1.17 yesterday.   Objective: Vital signs in last 24 hours: Temp:  [97.8 F (36.6 C)-99.6 F (37.6 C)] 97.8 F (36.6 C) (05/26 0624) Pulse Rate:  [74-101] 74 (05/26 0624) Resp:  [13-20] 18 (05/26 0215) BP: (94-129)/(52-72) 121/65 (05/26 0624) SpO2:  [91 %-100 %] 94 % (05/26 0624)  Intake/Output from previous day: 05/25 0701 - 05/26 0700 In: 2024.5 [P.O.:120; I.V.:1704.5; IV Piggyback:200] Out: 3775 [Urine:3775] Intake/Output this shift: No intake/output data recorded.  Physical Exam:  General: Alert and oriented CV: Regular rate Lungs: No increased work of breathing Abdomen:  Soft, appropriately tender.  GU: Voiding spontaneously  Ext: NT, No erythema  Lab Results: Recent Labs    12/22/19 1845 12/23/19 0138 12/24/19 0401  HGB 11.2* 10.5* 10.3*  HCT 35.3* 33.7* 33.0*   Recent Labs    12/23/19 0138 12/24/19 0401  NA 139 139  K 4.1 4.2  CL 104 106  CO2 25 23  GLUCOSE 122* 172*  BUN 16 21  CREATININE 1.17* 1.41*  CALCIUM 9.7 9.2    Studies/Results: DG CHEST PORT 1 VIEW  Result Date: 12/23/2019 CLINICAL DATA:  Syncope EXAM: PORTABLE CHEST 1 VIEW COMPARISON:  November 27, 2017 FINDINGS: There is mild atelectasis in the left base. The lungs elsewhere are clear. Heart is upper normal in size with pulmonary vascularity normal. No adenopathy. There is postoperative change in the lower cervical spine. IMPRESSION: Slight left base atelectasis. Lungs elsewhere clear. Heart upper normal in size. No adenopathy. Electronically Signed   By: Lowella Grip III M.D.   On: 12/23/2019 13:28   DG C-Arm 1-60 Min-No Report  Result Date: 12/23/2019 Fluoroscopy was utilized by the requesting physician.  No radiographic  interpretation.   CT Renal Stone Study  Result Date: 12/22/2019 CLINICAL DATA:  Left flank pain status post lithotripsy today. EXAM: CT ABDOMEN AND PELVIS WITHOUT CONTRAST TECHNIQUE: Multidetector CT imaging of the abdomen and pelvis was performed following the standard protocol without IV contrast. COMPARISON:  Dec 17, 2019. FINDINGS: Lower chest: No acute abnormality. Hepatobiliary: No focal liver abnormality is seen. No gallstones, gallbladder wall thickening, or biliary dilatation. Pancreas: Unremarkable. No pancreatic ductal dilatation or surrounding inflammatory changes. Spleen: Normal in size without focal abnormality. Adrenals/Urinary Tract: Adrenal glands appear normal. Stable large simple cyst is seen arising from upper pole of right kidney. Right kidney and ureter are unremarkable. Urinary bladder is unremarkable. Left nephrolithiasis is noted, including 15 mm calculus in left renal pelvis. No significant hydronephrosis is noted. However, there is the interval development of mild to moderate size left renal subcapsular hematoma. Stomach/Bowel: Stomach is within normal limits. Appendix appears normal. No evidence of bowel wall thickening, distention, or inflammatory changes. Vascular/Lymphatic: Aortic atherosclerosis. No enlarged abdominal or pelvic lymph nodes. Reproductive: Uterus and bilateral adnexa are unremarkable. Other: No abdominal wall hernia or abnormality. No abdominopelvic ascites. Musculoskeletal: No acute or significant osseous findings. IMPRESSION: 1. Interval development of mild to moderate size left renal subcapsular hematoma. 2. Left nephrolithiasis, including 15 mm calculus in left renal pelvis. No significant hydronephrosis is noted. Aortic Atherosclerosis (ICD10-I70.0). Electronically Signed   By: Marijo Conception M.D.   On: 12/22/2019 20:17    Assessment/Plan:  69 y.o. female with 1.5cm left renal pelvis  stone s/p SWL 12/22/19 c/b ureteral obstruction with stone vs clot as  well as small subcapsular hematoma s/p left ureteral stent placement on 12/23/19 .  Overall doing well post-op.   - Continue ureteral stent - Voiding spontaneously - Hgb stable - Follow up as planned.   Dispo: Appropriate to discharge from urologic perspective.    LOS: 2 days

## 2019-12-24 NOTE — Progress Notes (Signed)
PT Cancellation Note  Patient Details Name: [name] MRN: [id number] DOB: March 24, 1951   Cancelled Treatment:    Reason Eval/Treat Not Completed: PT screened, no needs identified, will sign off   Lelon Mast 12/24/2019, 1:18 PM

## 2019-12-24 NOTE — Discharge Summary (Signed)
Discharge Summary  [name] [id number] DOB: 02/05/1951  PCP: Kathyrn Lass, MD  Admit date: 12/22/2019 Discharge date: 12/24/2019  Time spent: 40 mins  Recommendations for Outpatient Follow-up:  1. Urology as scheduled 2. PCP in 1 week with repeat labs  Discharge Diagnoses:  Active Hospital Problems   Diagnosis Date Noted  . Syncope 12/22/2019  . Kidney hematoma without rupture of capsule, without open wound into cavity 12/22/2019  . Acute urinary retention 12/22/2019  . Obesity (BMI 30-39.9) 11/25/2018  . Obstructive sleep apnea 09/01/2013  . Pure hypercholesterolemia 09/01/2013  . Coronary atherosclerosis of native coronary artery 09/01/2013  . Essential hypertension, benign 09/01/2013    Resolved Hospital Problems  No resolved problems to display.    Discharge Condition: Stable  Diet recommendation: Heart healthy   Vitals:   12/24/19 0215 12/24/19 0624  BP: 119/65 121/65  Pulse: 79 74  Resp: 18   Temp: 98.4 F (36.9 C) 97.8 F (36.6 C)  SpO2: 93% 94%    History of present illness:  Carol Kun Egekvistis a 69 y.o.femalewith medical history significant forCAD, hypertension, hyperlipidemia, OSA on CPAP, morbid obesity, nephrolithiasis who presents with concerns of syncope and urinary retention. Patient had an elective outpatient ESWL PTA on 12/22/19 for a left renal stone with urology. Upon returning home, had worsening pain to her left lower back and left lower quadrant abdomen. She also felt nauseous and try to get to the bathroom but had loss of consciousness and woke up on her back. Fall was unwitnessed but sister heard her fall and another roommate came to check on her. She initially awoke and was confused. She reports that later in the day she again felt nauseous while in bed and passed out in the bed. Both times she has felt very dizzy suddenly. Patient reports that she has had syncope associated with her nausea many years ago and has had a  work-up with no findings. CT renal stone shows interval development of mild to moderate size left renal subcapsular hematoma and 37mm calculus in left renal pelvis. Urology consulted.    Today, pt denies any new complaints, eager to be discharged.  Patient reports some mild urethral spasm, denies any chest pain, shortness of breath, abdominal pain, nausea/vomiting, fever/chills.  Okay to discharge from a urology standpoint with follow-up scheduled.  Patient advised to follow-up with PCP within 1 week, with repeat labs to follow renal function/creatinine   Hospital Course:  Principal Problem:   Syncope Active Problems:   Obstructive sleep apnea   Pure hypercholesterolemia   Coronary atherosclerosis of native coronary artery   Essential hypertension, benign   Obesity (BMI 30-39.9)   Kidney hematoma without rupture of capsule, without open wound into cavity   Acute urinary retention  Left obstructing ureteral stone s/p ESWL on 12/22/19 with renal subcapsular hematoma (Dr Louis Meckel), s/p cystoscopy with stent placement on 12/23/2019 by Dr.Ehlers CT renal stone study showed "Interval development of mild to moderate size left renal subcapsular hematoma. Left nephrolithiasis, including 15 mm calculus in left renal pelvis. No significant hydronephrosis is noted" Urinalysis done and showed a hazy appearance with amber color urine, large hemoglobin, negative leukocytes, negative nitrites, rare bacteria, greater than 50 RBCs per high-power field and 21-50 WBCs Pain management, continue tamsulosin Follow-up with urology as an outpatient as scheduled  AKI Creatinine slightly up to 1.4 from 1.1 Likely due to above PCP/urology to monitor renal function closely, repeat labs within 1 week to ensure resolution  Normocytic/iron def anemia Hemoglobin  with a slight decline Anemia panel showed iron 20, sats 6, vitamin B12 265 Status post 1 dose of Feraheme on 12/24/2019, start vitamin B12 supplement, advised  patient to be compliant with her p.o. iron supplements Follow-up with PCP  Type 2 diabetes mellitus A1c 6.6 Continue home Metformin  Syncope Nausea. Likely vasovagal No significant events on telemetry  OSA Continue CPAP  History of CAD C/w ASA 81 mg po Daily   Hypertension Continue Lisinopril 20 mg po Daily   Hyperlipidemia C/w Atorvastatin 40 mg po Daily and Ezetimibe 10 mg po Daily   Obesity Estimated body mass index is 37.29 kg/m as calculated from the following:   Height as of this encounter: 5\' 6"  (1.676 m).   Weight as of this encounter: 104.8 kg. Weight Loss and Dietary Counseling given         Malnutrition Type:      Malnutrition Characteristics:      Nutrition Interventions:      Estimated body mass index is 37.29 kg/m as calculated from the following:   Height as of this encounter: 5\' 6"  (1.676 m).   Weight as of this encounter: 104.8 kg.    Procedures:  As mentioned above  Consultations:  Urology  Discharge Exam: BP 121/65 (BP Location: Right Arm)   Pulse 74   Temp 97.8 F (36.6 C) (Oral)   Resp 18   Ht 5\' 6"  (1.676 m)   Wt 104.8 kg   SpO2 94%   BMI 37.29 kg/m   General: NAD  Cardiovascular: S1, S2 present Respiratory: CTAB   Discharge Instructions You were cared for by a hospitalist during your hospital stay. If you have any questions about your discharge medications or the care you received while you were in the hospital after you are discharged, you can call the unit and asked to speak with the hospitalist on call if the hospitalist that took care of you is not available. Once you are discharged, your primary care physician will handle any further medical issues. Please note that NO REFILLS for any discharge medications will be authorized once you are discharged, as it is imperative that you return to your primary care physician (or establish a relationship with a primary care physician if you do not have one) for your  aftercare needs so that they can reassess your need for medications and monitor your lab values.  Discharge Instructions    Diet - low sodium heart healthy   Complete by: As directed    Increase activity slowly   Complete by: As directed      Allergies as of 12/24/2019      Reactions   Guaifenesin & Derivatives Itching   Morphine And Related Itching   Hydrocodone and oxycodone   Penicillins Itching   Tramadol Itching   Hydrocodone-acetaminophen Itching   Oxycodone Hcl Itching      Medication List    STOP taking these medications   HYDROmorphone 2 MG tablet Commonly known as: DILAUDID   meloxicam 15 MG tablet Commonly known as: MOBIC     TAKE these medications   ammonium lactate 12 % cream Commonly known as: Lac-Hydrin Apply topically as needed for dry skin.   aspirin 81 MG tablet Take 81 mg by mouth daily.   atorvastatin 40 MG tablet Commonly known as: LIPITOR Take 1 tablet (40 mg total) by mouth daily.   CALCIUM PO Take 800 mg by mouth daily.   clonazePAM 0.5 MG tablet Commonly known as: KLONOPIN Take 0.5  mg by mouth daily.   cyanocobalamin 1000 MCG tablet Take 1 tablet (1,000 mcg total) by mouth daily. Start taking on: Dec 25, 2019   desvenlafaxine 50 MG 24 hr tablet Commonly known as: PRISTIQ Take 50 mg by mouth daily.   esomeprazole 40 MG capsule Commonly known as: NexIUM Take 1 capsule (40 mg total) by mouth 2 (two) times daily before a meal. What changed:   when to take this  reasons to take this   ezetimibe 10 MG tablet Commonly known as: ZETIA Take 1 tablet (10 mg total) by mouth daily.   FERROUS SULFATE PO Take 65 mg by mouth daily.   Fish Oil 1200 MG Caps Take 3 capsules by mouth daily.   ibuprofen 200 MG tablet Commonly known as: ADVIL Take 400 mg by mouth as needed (pain).   lisinopril 20 MG tablet Commonly known as: ZESTRIL Take 20 mg by mouth daily.   metFORMIN 500 MG tablet Commonly known as: GLUCOPHAGE Take 500 mg by  mouth 2 (two) times daily with a meal.   NONFORMULARY OR COMPOUNDED ITEM Apply topically 2 (two) times daily. Salicylic Acid [id number], Urea [id number] Cream, apply to the affected area once or twice daily   phenazopyridine 200 MG tablet Commonly known as: Pyridium Take 1 tablet (200 mg total) by mouth 3 (three) times daily as needed for pain.   PRESERVISION AREDS 2 PO Take 1 capsule by mouth daily.   tamsulosin 0.4 MG Caps capsule Commonly known as: FLOMAX Take 0.4 mg by mouth daily.   traMADol 50 MG tablet Commonly known as: Ultram Take 1-2 tablets (50-100 mg total) by mouth every 6 (six) hours as needed for moderate pain.   traZODone 50 MG tablet Commonly known as: DESYREL Take 100 mg by mouth at bedtime.   VITAMIN D3 PO Take 2,000 Units by mouth daily.      Allergies  Allergen Reactions  . Guaifenesin & Derivatives Itching  . Morphine And Related Itching    Hydrocodone and oxycodone  . Penicillins Itching  . Tramadol Itching  . Hydrocodone-Acetaminophen Itching  . Oxycodone Hcl Itching   Follow-up Information    Jed Limerick, NP On 01/05/2020.   Specialty: Urology Contact information: [place] [address] [phone]        Kathyrn Lass, MD. Schedule an appointment as soon as possible for a visit in 1 week(s).   Specialty: Family Medicine Contact information: [place] [address] [phone]        [name], MD .   Specialty: Cardiology Contact information: [phone] [address]. [address] [place] [address] [phone]            The results of significant diagnostics from this hospitalization (including imaging, microbiology, ancillary and laboratory) are listed below for reference.    Significant Diagnostic Studies: CT ABDOMEN PELVIS WO CONTRAST  Result Date: 12/17/2019 CLINICAL DATA:  Left-sided abdominal pain, microscopic hematuria EXAM: CT ABDOMEN AND PELVIS WITHOUT CONTRAST TECHNIQUE:  Multidetector CT imaging of the abdomen and pelvis was performed following the standard protocol without IV contrast. COMPARISON:  2014 FINDINGS: Lower chest: No acute abnormality. Hepatobiliary: No focal liver abnormality is seen. No gallstones, gallbladder wall thickening, or biliary dilatation. Pancreas: Unremarkable. Spleen: Unremarkable. Adrenals/Urinary Tract: Adrenals are unremarkable. There is a 7.7 cm cyst of the upper pole of the right kidney. No right renal calculi. There is an obstructing calculus within the left renal collecting system measuring up  to 1.3 cm axially and 1.4 cm craniocaudally. Resulting mild hydronephrosis. There are several additional small nonobstructing calculi of the left kidney measuring up to 3 mm. Stomach/Bowel: Stomach is within normal limits. Bowel is normal in caliber. Distal colonic diverticulosis. Vascular/Lymphatic: Mild aortic atherosclerosis. There are no enlarged lymph nodes identified. Reproductive: Uterus and bilateral adnexa are unremarkable. Other: No ascites.  Abdominal wall is unremarkable. Musculoskeletal: Multilevel degenerative changes of the thoracolumbar spine. IMPRESSION: 1.4 cm obstructing calculus of the left renal collecting system with mild hydronephrosis. Additional small nonobstructing left renal calculi. Electronically Signed   By: Macy Mis M.D.   On: 12/17/2019 16:05   DG Abd 1 View  Result Date: 12/22/2019 CLINICAL DATA:  Preop evaluation for upcoming lithotripsy EXAM: ABDOMEN - 1 VIEW COMPARISON:  12/17/2019 FINDINGS: Scattered large and small bowel gas is noted. Mild retained fecal material is seen. Stable left renal calculi are noted most prominent in the renal pelvis again measuring approximately 16 mm in greatest dimension. Degenerative changes of lumbar spine are seen. IMPRESSION: Mild constipation. Stable left renal calculi similar to that seen on prior CT. Electronically Signed   By: Inez Catalina M.D.   On: 12/22/2019 07:57   DG  CHEST PORT 1 VIEW  Result Date: 12/23/2019 CLINICAL DATA:  Syncope EXAM: PORTABLE CHEST 1 VIEW COMPARISON:  November 27, 2017 FINDINGS: There is mild atelectasis in the left base. The lungs elsewhere are clear. Heart is upper normal in size with pulmonary vascularity normal. No adenopathy. There is postoperative change in the lower cervical spine. IMPRESSION: Slight left base atelectasis. Lungs elsewhere clear. Heart upper normal in size. No adenopathy. Electronically Signed   By: Lowella Grip III M.D.   On: 12/23/2019 13:28   DG C-Arm 1-60 Min-No Report  Result Date: 12/23/2019 Fluoroscopy was utilized by the requesting physician.  No radiographic interpretation.   CT Renal Stone Study  Result Date: 12/22/2019 CLINICAL DATA:  Left flank pain status post lithotripsy today. EXAM: CT ABDOMEN AND PELVIS WITHOUT CONTRAST TECHNIQUE: Multidetector CT imaging of the abdomen and pelvis was performed following the standard protocol without IV contrast. COMPARISON:  Dec 17, 2019. FINDINGS: Lower chest: No acute abnormality. Hepatobiliary: No focal liver abnormality is seen. No gallstones, gallbladder wall thickening, or biliary dilatation. Pancreas: Unremarkable. No pancreatic ductal dilatation or surrounding inflammatory changes. Spleen: Normal in size without focal abnormality. Adrenals/Urinary Tract: Adrenal glands appear normal. Stable large simple cyst is seen arising from upper pole of right kidney. Right kidney and ureter are unremarkable. Urinary bladder is unremarkable. Left nephrolithiasis is noted, including 15 mm calculus in left renal pelvis. No significant hydronephrosis is noted. However, there is the interval development of mild to moderate size left renal subcapsular hematoma. Stomach/Bowel: Stomach is within normal limits. Appendix appears normal. No evidence of bowel wall thickening, distention, or inflammatory changes. Vascular/Lymphatic: Aortic atherosclerosis. No enlarged abdominal or pelvic  lymph nodes. Reproductive: Uterus and bilateral adnexa are unremarkable. Other: No abdominal wall hernia or abnormality. No abdominopelvic ascites. Musculoskeletal: No acute or significant osseous findings. IMPRESSION: 1. Interval development of mild to moderate size left renal subcapsular hematoma. 2. Left nephrolithiasis, including 15 mm calculus in left renal pelvis. No significant hydronephrosis is noted. Aortic Atherosclerosis (ICD10-I70.0). Electronically Signed   By: Marijo Conception M.D.   On: 12/22/2019 20:17    Microbiology: Recent Results (from the past 240 hour(s))  SARS CORONAVIRUS 2 (TAT 6-24 HRS) Nasopharyngeal Nasopharyngeal Swab     Status: None   Collection Time: 12/19/19  12:59 PM   Specimen: Nasopharyngeal Swab  Result Value Ref Range Status   SARS Coronavirus 2 NEGATIVE NEGATIVE Final    Comment: (NOTE) SARS-CoV-2 target nucleic acids are NOT DETECTED. The SARS-CoV-2 RNA is generally detectable in upper and lower respiratory specimens during the acute phase of infection. Negative results do not preclude SARS-CoV-2 infection, do not rule out co-infections with other pathogens, and should not be used as the sole basis for treatment or other patient management decisions. Negative results must be combined with clinical observations, patient history, and epidemiological information. The expected result is Negative. Fact Sheet for Patients: SugarRoll.be Fact Sheet for Healthcare Providers: https://www.woods-mathews.com/ This test is not yet approved or cleared by the [place] FDA and  has been authorized for detection and/or diagnosis of SARS-CoV-2 by FDA under an Emergency Use Authorization (EUA). This EUA will remain  in effect (meaning this test can be used) for the duration of the COVID-19 declaration under Section 56 4(b)(1) of the Act, 21 U.S.C. section 360bbb-3(b)(1), unless the authorization is terminated or revoked  sooner. Performed at [facility], [place] [address] [place], [place] [address]   SARS Coronavirus 2 by RT PCR (hospital order, performed in [facility] hospital lab) Nasopharyngeal Nasopharyngeal Swab     Status: None   Collection Time: 12/23/19 12:05 AM   Specimen: Nasopharyngeal Swab  Result Value Ref Range Status   SARS Coronavirus 2 NEGATIVE NEGATIVE Final    Comment: (NOTE) SARS-CoV-2 target nucleic acids are NOT DETECTED. The SARS-CoV-2 RNA is generally detectable in upper and lower respiratory specimens during the acute phase of infection. The lowest concentration of SARS-CoV-2 viral copies this assay can detect is 250 copies / mL. A negative result does not preclude SARS-CoV-2 infection and should not be used as the sole basis for treatment or other patient management decisions.  A negative result may occur with improper specimen collection / handling, submission of specimen other than nasopharyngeal swab, presence of viral mutation(s) within the areas targeted by this assay, and inadequate number of viral copies (<250 copies / mL). A negative result must be combined with clinical observations, patient history, and epidemiological information. Fact Sheet for Patients:   StrictlyIdeas.no Fact Sheet for Healthcare Providers: BankingDealers.co.za This test is not yet approved or cleared  by the [place] FDA and has been authorized for detection and/or diagnosis of SARS-CoV-2 by FDA under an Emergency Use Authorization (EUA).  This EUA will remain in effect (meaning this test can be used) for the duration of the COVID-19 declaration under Section 564(b)(1) of the Act, 21 U.S.C. section 360bbb-3(b)(1), unless the authorization is terminated or revoked sooner. Performed at [facility], [place] [address], [place], [place] [address]      Labs: Basic Metabolic Panel: Recent Labs  Lab 12/22/19 1845  12/23/19 0138 12/24/19 0401  NA 140 139 139  K 3.9 4.1 4.2  CL 103 104 106  CO2 27 25 23   GLUCOSE 145* 122* 172*  BUN 19 16 21   CREATININE 1.02* 1.17* 1.41*  CALCIUM 9.8 9.7 9.2  MG  --   --  1.9  PHOS  --   --  3.8   Liver Function Tests: Recent Labs  Lab 12/18/19 1056 12/22/19 1845 12/24/19 0401  AST  --  20 15  ALT 23 25 22   ALKPHOS  --  68 57  BILITOT  --  0.3 0.2*  PROT  --  6.8 6.0*  ALBUMIN  --  4.0 3.5  No results for input(s): LIPASE, AMYLASE in the last 168 hours. No results for input(s): AMMONIA in the last 168 hours. CBC: Recent Labs  Lab 12/22/19 1845 12/23/19 0138 12/24/19 0401  WBC 8.9 7.2 6.6  NEUTROABS 8.0*  --  5.9  HGB 11.2* 10.5* 10.3*  HCT 35.3* 33.7* 33.0*  MCV 89.1 88.7 88.7  PLT 188 176 171   Cardiac Enzymes: No results for input(s): CKTOTAL, CKMB, CKMBINDEX, TROPONINI in the last 168 hours. BNP: BNP (last 3 results) No results for input(s): BNP in the last 8760 hours.  ProBNP (last 3 results) No results for input(s): PROBNP in the last 8760 hours.  CBG: Recent Labs  Lab 12/22/19 0832 12/22/19 1132 12/23/19 1744  GLUCAP 105* 83 148*       Signed:  Alma Friendly, MD [facility] 12/24/2019, 11:29 AM

## 2019-12-24 NOTE — Evaluation (Signed)
Occupational Therapy Evaluation Patient Details Name: [name] MRN: [id number] DOB: 05/12/1951 Today's Date: 12/24/2019    History of Present Illness 69 y.o. female with medical history significant for CAD, hypertension, hyperlipidemia, OSA on CPAP, morbid obesity, nephrolithiasis who presents with concerns of syncope and urinary retention   Clinical Impression   Patient with functional deficits listed below impacting safety and independence with self care. Patient supervision level for functional mobility and transfers due to mild unsteadiness with cue to use IV pole for steadying assist. Do not anticipate any follow up OT needs at discharge.    Follow Up Recommendations  No OT follow up    Equipment Recommendations  None recommended by OT       Precautions / Restrictions Precautions Precautions: Fall Restrictions Weight Bearing Restrictions: No      Mobility Bed Mobility Overal bed mobility: Modified Independent                Transfers Overall transfer level: Needs assistance Equipment used: (IV pole) Transfers: Sit to/from Stand Sit to Stand: Supervision         General transfer comment: mild unsteadiness noted, pt utilize IV pole for steadying assist    Balance Overall balance assessment: Mild deficits observed, not formally tested                                         ADL either performed or assessed with clinical judgement   ADL Overall ADL's : Needs assistance/impaired     Grooming: Wash/dry hands;Supervision/safety;Standing   Upper Body Bathing: Set up;Sitting   Lower Body Bathing: Supervison/ safety;Sit to/from stand   Upper Body Dressing : Set up;Sitting   Lower Body Dressing: Supervision/safety;Sit to/from stand Lower Body Dressing Details (indicate cue type and reason): able to pull up socks seated EOB Toilet Transfer: Supervision/safety;Ambulation;Regular Toilet;Grab bars Toilet Transfer Details (indicate  cue type and reason): supervision for safety as patient is mildly unsteady Toileting- Clothing Manipulation and Hygiene: Independent;Sitting/lateral lean       Functional mobility during ADLs: Supervision/safety;Cueing for safety                    Pertinent Vitals/Pain Pain Assessment: Faces Faces Pain Scale: Hurts little more Pain Location: urethra Pain Descriptors / Indicators: Spasm Pain Intervention(s): Patient requesting pain meds-RN notified     Hand Dominance Right   Extremity/Trunk Assessment Upper Extremity Assessment Upper Extremity Assessment: Overall WFL for tasks assessed   Lower Extremity Assessment Lower Extremity Assessment: Defer to PT evaluation   Cervical / Trunk Assessment Cervical / Trunk Assessment: Normal   Communication Communication Communication: No difficulties   Cognition Arousal/Alertness: Awake/alert Behavior During Therapy: WFL for tasks assessed/performed Overall Cognitive Status: Within Functional Limits for tasks assessed                                                Home Living Family/patient expects to be discharged to:: Private residence Living Arrangements: Alone Available Help at Discharge: Family;Available PRN/intermittently Type of Home: House Home Access: Stairs to enter [employer] of Steps: 2   Home Layout: One level     Bathroom Shower/Tub: Occupational psychologist: Standard     Home Equipment: Cane - single point;Grab bars - toilet;Grab  bars - tub/shower          Prior Functioning/Environment Level of Independence: Independent                 OT Problem List: Decreased activity tolerance;Impaired balance (sitting and/or standing);Pain      OT Treatment/Interventions: Self-care/ADL training;Therapeutic exercise;Therapeutic activities;Patient/family education;Balance training    OT Goals(Current goals can be found in the care plan section) Acute Rehab OT  Goals Patient Stated Goal: to feel better OT Goal Formulation: With patient Time For Goal Achievement: 01/07/20 Potential to Achieve Goals: Good  OT Frequency: Min 2X/week    AM-PAC OT "6 Clicks" Daily Activity     Outcome Measure Help from another person eating meals?: None Help from another person taking care of personal grooming?: A Little Help from another person toileting, which includes using toliet, bedpan, or urinal?: A Little Help from another person bathing (including washing, rinsing, drying)?: A Little Help from another person to put on and taking off regular upper body clothing?: A Little Help from another person to put on and taking off regular lower body clothing?: A Little 6 Click Score: 19   End of Session Nurse Communication: Patient requests pain meds  Activity Tolerance: Patient tolerated treatment well Patient left: in bed;with call bell/phone within reach  OT Visit Diagnosis: Unsteadiness on feet (R26.81);Pain Pain - part of body: (urethra)                Time: [id number] OT Time Calculation (min): 16 min Charges:  OT General Charges $OT Visit: 1 Visit OT Evaluation $OT Eval Moderate Complexity: [place] OT Pager: [place] 12/24/2019, 2:26 PM

## 2019-12-25 DIAGNOSIS — E7800 Pure hypercholesterolemia, unspecified: Secondary | ICD-10-CM | POA: Diagnosis not present

## 2019-12-25 DIAGNOSIS — G4700 Insomnia, unspecified: Secondary | ICD-10-CM | POA: Diagnosis not present

## 2019-12-25 DIAGNOSIS — D649 Anemia, unspecified: Secondary | ICD-10-CM | POA: Diagnosis not present

## 2019-12-25 DIAGNOSIS — I2510 Atherosclerotic heart disease of native coronary artery without angina pectoris: Secondary | ICD-10-CM | POA: Diagnosis not present

## 2019-12-25 DIAGNOSIS — I10 Essential (primary) hypertension: Secondary | ICD-10-CM | POA: Diagnosis not present

## 2019-12-25 DIAGNOSIS — E1169 Type 2 diabetes mellitus with other specified complication: Secondary | ICD-10-CM | POA: Diagnosis not present

## 2019-12-25 DIAGNOSIS — F329 Major depressive disorder, single episode, unspecified: Secondary | ICD-10-CM | POA: Diagnosis not present

## 2019-12-25 DIAGNOSIS — E1149 Type 2 diabetes mellitus with other diabetic neurological complication: Secondary | ICD-10-CM | POA: Diagnosis not present

## 2019-12-25 DIAGNOSIS — M1990 Unspecified osteoarthritis, unspecified site: Secondary | ICD-10-CM | POA: Diagnosis not present

## 2019-12-26 DIAGNOSIS — N200 Calculus of kidney: Secondary | ICD-10-CM | POA: Diagnosis not present

## 2019-12-30 DIAGNOSIS — N200 Calculus of kidney: Secondary | ICD-10-CM | POA: Diagnosis not present

## 2019-12-31 ENCOUNTER — Other Ambulatory Visit: Payer: Self-pay | Admitting: Urology

## 2019-12-31 DIAGNOSIS — F341 Dysthymic disorder: Secondary | ICD-10-CM | POA: Diagnosis not present

## 2019-12-31 DIAGNOSIS — N200 Calculus of kidney: Secondary | ICD-10-CM | POA: Diagnosis not present

## 2019-12-31 NOTE — Progress Notes (Signed)
Need orders in epic.  Surgery on 01/05/20.  Thank You.

## 2020-01-01 ENCOUNTER — Telehealth: Payer: Self-pay

## 2020-01-01 NOTE — Telephone Encounter (Signed)
Left message for patient to call back in regards to MyChart message. °

## 2020-01-02 ENCOUNTER — Other Ambulatory Visit (HOSPITAL_COMMUNITY)
Admission: RE | Admit: 2020-01-02 | Discharge: 2020-01-02 | Disposition: A | Discharge status: Home / Self Care | Payer: Medicare PPO | Source: Ambulatory Visit | Attending: Urology | Admitting: Urology

## 2020-01-02 ENCOUNTER — Other Ambulatory Visit: Payer: Self-pay

## 2020-01-02 ENCOUNTER — Encounter (HOSPITAL_COMMUNITY): Payer: Self-pay | Admitting: Urology

## 2020-01-02 DIAGNOSIS — Z01812 Encounter for preprocedural laboratory examination: Secondary | ICD-10-CM | POA: Diagnosis not present

## 2020-01-02 DIAGNOSIS — Z20822 Contact with and (suspected) exposure to covid-19: Secondary | ICD-10-CM | POA: Insufficient documentation

## 2020-01-02 MED ORDER — LISINOPRIL 10 MG PO TABS
10.0000 mg | ORAL_TABLET | Freq: Every day | ORAL | Status: DC
Start: 2020-01-02 — End: 2020-01-08

## 2020-01-02 NOTE — Telephone Encounter (Signed)
Follow up ° ° °Patient is returning your call. Please call. ° ° ° °

## 2020-01-02 NOTE — Telephone Encounter (Signed)
I spoke with patient and gave her information from Dr Radford Pax. She has pill cutter and will split 20 mg tablets in half

## 2020-01-02 NOTE — Telephone Encounter (Signed)
[name] to [name], MD     5:36 PMHi. Had lithotripsy (5/24) and a stent put in (5/25). Lithotripsy unsuccessful. Lasar treatment next Monday for large stone in kidney. At [place] office today my BP was 107/?Marland Kitchen Been lightheaded since last week. After lithotripsy, passed out twice due to pain/ nausea (??). Should I be concerned?  You know my Lisinopril is at 20 MGS. now. It was 107 today before I was supposed to take BP medicine which the urologist (Dr. Roni Bread at [facility]) suggested I not do today.  Above copied from my chart message from patient. I spoke with patient. She was admitted to [facility] after episodes of passing out on 5/24. She has since had telemedicine visit with PCP. Patient thinks passing out was due to pain and nausea.  She said this happened in the past when she had the flu.  She reports feeling light headed and off balance at times.  States she was walking down the hall recently at the doctor's office and bumped into the wall.  Lightheadedness does not happen daily. She reports BP of 107 at doctor's office recently but later that day was 120. I asked patient to keep record of BP for next 7-10 days and if any concerns send to Dr Radford Pax for review. I told patient I would make Dr Radford Pax aware of passing out and lightheadedness and we would call her back if any changes recommended.

## 2020-01-02 NOTE — Telephone Encounter (Signed)
Decrease Lisinopril to 10mg  daily and check Bp and HR twice daily for a week and call with results

## 2020-01-03 LAB — SARS CORONAVIRUS 2 (TAT 6-24 HRS): SARS Coronavirus 2: NEGATIVE

## 2020-01-05 ENCOUNTER — Ambulatory Visit (HOSPITAL_COMMUNITY): Payer: Medicare PPO | Admitting: Physician Assistant

## 2020-01-05 ENCOUNTER — Encounter (HOSPITAL_COMMUNITY): Payer: Self-pay | Admitting: Urology

## 2020-01-05 ENCOUNTER — Ambulatory Visit (HOSPITAL_COMMUNITY)
Admission: RE | Admit: 2020-01-05 | Discharge: 2020-01-05 | Disposition: A | Discharge status: Home / Self Care | Payer: Medicare PPO | Attending: Urology | Admitting: Urology

## 2020-01-05 ENCOUNTER — Encounter (HOSPITAL_COMMUNITY)
Admission: RE | Disposition: A | Discharge status: Home / Self Care | Payer: Self-pay | Source: Home / Self Care | Attending: Urology

## 2020-01-05 ENCOUNTER — Ambulatory Visit (HOSPITAL_COMMUNITY): Payer: Medicare PPO

## 2020-01-05 DIAGNOSIS — I10 Essential (primary) hypertension: Secondary | ICD-10-CM | POA: Diagnosis not present

## 2020-01-05 DIAGNOSIS — Z79899 Other long term (current) drug therapy: Secondary | ICD-10-CM | POA: Insufficient documentation

## 2020-01-05 DIAGNOSIS — Z7982 Long term (current) use of aspirin: Secondary | ICD-10-CM | POA: Insufficient documentation

## 2020-01-05 DIAGNOSIS — Z96651 Presence of right artificial knee joint: Secondary | ICD-10-CM | POA: Diagnosis not present

## 2020-01-05 DIAGNOSIS — E669 Obesity, unspecified: Secondary | ICD-10-CM | POA: Insufficient documentation

## 2020-01-05 DIAGNOSIS — G4733 Obstructive sleep apnea (adult) (pediatric): Secondary | ICD-10-CM | POA: Diagnosis not present

## 2020-01-05 DIAGNOSIS — S37012A Minor contusion of left kidney, initial encounter: Secondary | ICD-10-CM | POA: Diagnosis not present

## 2020-01-05 DIAGNOSIS — N132 Hydronephrosis with renal and ureteral calculous obstruction: Secondary | ICD-10-CM | POA: Insufficient documentation

## 2020-01-05 DIAGNOSIS — E7800 Pure hypercholesterolemia, unspecified: Secondary | ICD-10-CM | POA: Diagnosis not present

## 2020-01-05 DIAGNOSIS — N200 Calculus of kidney: Secondary | ICD-10-CM | POA: Diagnosis not present

## 2020-01-05 DIAGNOSIS — Z7984 Long term (current) use of oral hypoglycemic drugs: Secondary | ICD-10-CM | POA: Insufficient documentation

## 2020-01-05 DIAGNOSIS — Z6835 Body mass index (BMI) 35.0-35.9, adult: Secondary | ICD-10-CM | POA: Insufficient documentation

## 2020-01-05 DIAGNOSIS — X58XXXA Exposure to other specified factors, initial encounter: Secondary | ICD-10-CM | POA: Diagnosis not present

## 2020-01-05 DIAGNOSIS — E119 Type 2 diabetes mellitus without complications: Secondary | ICD-10-CM | POA: Diagnosis not present

## 2020-01-05 DIAGNOSIS — I2510 Atherosclerotic heart disease of native coronary artery without angina pectoris: Secondary | ICD-10-CM | POA: Diagnosis not present

## 2020-01-05 DIAGNOSIS — K219 Gastro-esophageal reflux disease without esophagitis: Secondary | ICD-10-CM | POA: Insufficient documentation

## 2020-01-05 DIAGNOSIS — M47812 Spondylosis without myelopathy or radiculopathy, cervical region: Secondary | ICD-10-CM | POA: Insufficient documentation

## 2020-01-05 DIAGNOSIS — F329 Major depressive disorder, single episode, unspecified: Secondary | ICD-10-CM | POA: Insufficient documentation

## 2020-01-05 HISTORY — DX: Personal history of urinary calculi: Z87.442

## 2020-01-05 HISTORY — DX: Other specified postprocedural states: R11.2

## 2020-01-05 HISTORY — PX: CYSTOSCOPY/URETEROSCOPY/HOLMIUM LASER/STENT PLACEMENT: SHX6546

## 2020-01-05 HISTORY — DX: Other specified postprocedural states: Z98.890

## 2020-01-05 HISTORY — DX: Malignant (primary) neoplasm, unspecified: C80.1

## 2020-01-05 LAB — GLUCOSE, CAPILLARY
Glucose-Capillary: 119 mg/dL — ABNORMAL HIGH (ref 70–99)
Glucose-Capillary: 113 mg/dL — ABNORMAL HIGH (ref 70–99)

## 2020-01-05 SURGERY — CYSTOSCOPY/URETEROSCOPY/HOLMIUM LASER/STENT PLACEMENT
Anesthesia: General | Laterality: Left

## 2020-01-05 MED ORDER — LIDOCAINE 2% (20 MG/ML) 5 ML SYRINGE
INTRAMUSCULAR | Status: DC | PRN
  Administered 2020-01-05: 100 mg via INTRAVENOUS

## 2020-01-05 MED ORDER — ROCURONIUM BROMIDE 10 MG/ML (PF) SYRINGE
PREFILLED_SYRINGE | INTRAVENOUS | Status: AC
  Filled 2020-01-05: qty 10

## 2020-01-05 MED ORDER — ONDANSETRON HCL 4 MG/2ML IJ SOLN
INTRAMUSCULAR | Status: AC
  Filled 2020-01-05: qty 2

## 2020-01-05 MED ORDER — SUCCINYLCHOLINE CHLORIDE 200 MG/10ML IV SOSY
PREFILLED_SYRINGE | INTRAVENOUS | Status: AC
  Filled 2020-01-05: qty 10

## 2020-01-05 MED ORDER — MIDAZOLAM HCL 2 MG/2ML IJ SOLN
INTRAMUSCULAR | Status: DC | PRN
  Administered 2020-01-05: 1 mg via INTRAVENOUS

## 2020-01-05 MED ORDER — MIDAZOLAM HCL 2 MG/2ML IJ SOLN
INTRAMUSCULAR | Status: AC
  Filled 2020-01-05: qty 2

## 2020-01-05 MED ORDER — CHLORHEXIDINE GLUCONATE 0.12 % MT SOLN
15.0000 mL | Freq: Once | OROMUCOSAL | Status: AC
  Administered 2020-01-05: 15 mL via OROMUCOSAL

## 2020-01-05 MED ORDER — 0.9 % SODIUM CHLORIDE (POUR BTL) OPTIME
TOPICAL | Status: DC | PRN
  Administered 2020-01-05: 1000 mL

## 2020-01-05 MED ORDER — SODIUM CHLORIDE 0.9% FLUSH: 3.0000 mL | Freq: Two times a day (BID) | INTRAVENOUS | Status: DC

## 2020-01-05 MED ORDER — SODIUM CHLORIDE 0.9 % IR SOLN
Status: DC | PRN
  Administered 2020-01-05: 3000 mL

## 2020-01-05 MED ORDER — FENTANYL CITRATE (PF) 250 MCG/5ML IJ SOLN
INTRAMUSCULAR | Status: DC | PRN
  Administered 2020-01-05 (×2): 50 ug via INTRAVENOUS

## 2020-01-05 MED ORDER — PHENYLEPHRINE 40 MCG/ML (10ML) SYRINGE FOR IV PUSH (FOR BLOOD PRESSURE SUPPORT)
PREFILLED_SYRINGE | INTRAVENOUS | Status: DC | PRN
  Administered 2020-01-05: 200 ug via INTRAVENOUS

## 2020-01-05 MED ORDER — EPHEDRINE 5 MG/ML INJ
INTRAVENOUS | Status: AC
  Filled 2020-01-05: qty 10

## 2020-01-05 MED ORDER — PHENYLEPHRINE HCL (PRESSORS) 10 MG/ML IV SOLN
INTRAVENOUS | Status: AC
  Filled 2020-01-05: qty 1

## 2020-01-05 MED ORDER — CIPROFLOXACIN IN D5W 400 MG/200ML IV SOLN
INTRAVENOUS | Status: DC | PRN
  Administered 2020-01-05: 400 mg via INTRAVENOUS

## 2020-01-05 MED ORDER — LACTATED RINGERS IV SOLN: INTRAVENOUS | Status: DC

## 2020-01-05 MED ORDER — ORAL CARE MOUTH RINSE: 15.0000 mL | Freq: Once | OROMUCOSAL | Status: AC

## 2020-01-05 MED ORDER — CIPROFLOXACIN IN D5W 400 MG/200ML IV SOLN
400.0000 mg | INTRAVENOUS | Status: DC
  Filled 2020-01-05: qty 200

## 2020-01-05 MED ORDER — PROPOFOL 10 MG/ML IV BOLUS
INTRAVENOUS | Status: AC
  Filled 2020-01-05: qty 20

## 2020-01-05 MED ORDER — DEXAMETHASONE SODIUM PHOSPHATE 10 MG/ML IJ SOLN
INTRAMUSCULAR | Status: DC | PRN
Start: 2020-01-05 — End: 2020-01-05
  Administered 2020-01-05: 10 mg via INTRAVENOUS

## 2020-01-05 MED ORDER — FENTANYL CITRATE (PF) 100 MCG/2ML IJ SOLN
INTRAMUSCULAR | Status: AC
  Filled 2020-01-05: qty 2

## 2020-01-05 MED ORDER — ONDANSETRON HCL 4 MG/2ML IJ SOLN
INTRAMUSCULAR | Status: DC | PRN
  Administered 2020-01-05: 4 mg via INTRAVENOUS

## 2020-01-05 MED ORDER — LIDOCAINE 2% (20 MG/ML) 5 ML SYRINGE
INTRAMUSCULAR | Status: AC
  Filled 2020-01-05: qty 5

## 2020-01-05 MED ORDER — DEXAMETHASONE SODIUM PHOSPHATE 10 MG/ML IJ SOLN
INTRAMUSCULAR | Status: AC
  Filled 2020-01-05: qty 1

## 2020-01-05 MED ORDER — PROPOFOL 10 MG/ML IV BOLUS
INTRAVENOUS | Status: DC | PRN
  Administered 2020-01-05: 20 mg via INTRAVENOUS
  Administered 2020-01-05: 180 mg via INTRAVENOUS

## 2020-01-05 MED ORDER — PROMETHAZINE HCL 25 MG/ML IJ SOLN: 6.2500 mg | INTRAMUSCULAR | Status: DC | PRN

## 2020-01-05 MED ORDER — ACETAMINOPHEN 500 MG PO TABS
1000.0000 mg | ORAL_TABLET | Freq: Once | ORAL | Status: AC
  Administered 2020-01-05: 1000 mg via ORAL
  Filled 2020-01-05: qty 2

## 2020-01-05 MED ORDER — FENTANYL CITRATE (PF) 100 MCG/2ML IJ SOLN: 25.0000 ug | INTRAMUSCULAR | Status: DC | PRN

## 2020-01-05 SURGICAL SUPPLY — 21 items
BAG URO CATCHER STRL LF (MISCELLANEOUS) ×2 IMPLANT
BASKET STONE NCOMPASS (UROLOGICAL SUPPLIES) IMPLANT
CATH URET 5FR 28IN OPEN ENDED (CATHETERS) IMPLANT
CATH URET DUAL LUMEN 6-10FR 50 (CATHETERS) IMPLANT
CLOTH BEACON ORANGE TIMEOUT ST (SAFETY) ×2 IMPLANT
EXTRACTOR STONE NITINOL NGAGE (UROLOGICAL SUPPLIES) ×1 IMPLANT
FIBER LASER FLEXIVA 1000 (UROLOGICAL SUPPLIES) IMPLANT
FIBER LASER FLEXIVA 365 (UROLOGICAL SUPPLIES) IMPLANT
FIBER LASER FLEXIVA 550 (UROLOGICAL SUPPLIES) IMPLANT
FIBER LASER TRAC TIP (UROLOGICAL SUPPLIES) ×1 IMPLANT
GLOVE SURG SS PI 8.0 STRL IVOR (GLOVE) ×2 IMPLANT
GOWN STRL REUS W/TWL XL LVL3 (GOWN DISPOSABLE) ×2 IMPLANT
GUIDEWIRE STR DUAL SENSOR (WIRE) ×2 IMPLANT
IV NS IRRIG 3000ML ARTHROMATIC (IV SOLUTION) ×2 IMPLANT
KIT TURNOVER KIT A (KITS) IMPLANT
MANIFOLD NEPTUNE II (INSTRUMENTS) ×2 IMPLANT
SHEATH URETERAL 12FRX35CM (MISCELLANEOUS) ×1 IMPLANT
STENT URET 6FRX24 CONTOUR (STENTS) ×1 IMPLANT
TRAY CYSTO PACK (CUSTOM PROCEDURE TRAY) ×2 IMPLANT
TUBING CONNECTING 10 (TUBING) ×2 IMPLANT
TUBING UROLOGY SET (TUBING) ×2 IMPLANT

## 2020-01-05 NOTE — Anesthesia Postprocedure Evaluation (Signed)
Anesthesia Post Note  Patient: [name]  Procedure(s) Performed: CYSTOSCOPY LEFT URETEROSCOPY/HOLMIUM LASER/STENT EXCHANGE (Left )     Patient location during evaluation: PACU Anesthesia Type: General Level of consciousness: awake and alert and oriented Pain management: pain level controlled Vital Signs Assessment: post-procedure vital signs reviewed and stable Respiratory status: spontaneous breathing, nonlabored ventilation and respiratory function stable Cardiovascular status: blood pressure returned to baseline Postop Assessment: no apparent nausea or vomiting Anesthetic complications: no    Last Vitals:  Vitals:   01/05/20 1830 01/05/20 1845  BP: 125/66 120/64  Pulse: 69 66  Resp: 12 12  Temp:  37.2 C  SpO2: 96% 96%    Last Pain:  Vitals:   01/05/20 1845  TempSrc:   PainSc: [place]

## 2020-01-05 NOTE — Anesthesia Procedure Notes (Signed)
Date/Time: 01/05/2020 6:11 PM Performed by: Cynda Familia, CRNA Oxygen Delivery Method: Simple face mask Placement Confirmation: positive ETCO2 and breath sounds checked- equal and bilateral Dental Injury: Teeth and Oropharynx as per pre-operative assessment

## 2020-01-05 NOTE — Anesthesia Procedure Notes (Signed)
Procedure Name: LMA Insertion Date/Time: 01/05/2020 4:34 PM Performed by: Cynda Familia, CRNA Pre-anesthesia Checklist: Patient identified, Emergency Drugs available, Suction available and Patient being monitored Patient Re-evaluated:Patient Re-evaluated prior to induction Oxygen Delivery Method: Circle System Utilized Preoxygenation: Pre-oxygenation with 100% oxygen Induction Type: IV induction Ventilation: Mask ventilation without difficulty LMA: LMA inserted and LMA with gastric port inserted LMA Size: 4.0 Number of attempts: 1 Placement Confirmation: positive ETCO2 and breath sounds checked- equal and bilateral Tube secured with: Tape Dental Injury: Teeth and Oropharynx as per pre-operative assessment  Comments: Smooth Iv induction Howze-- LMA insertion atraumatic-- teeth and mouth as preop - bilat BS Howze

## 2020-01-05 NOTE — Discharge Instructions (Addendum)
Ureteral Stent Implantation, Care After This sheet gives you information about how to care for yourself after your procedure. Your health care provider may also give you more specific instructions. If you have problems or questions, contact your health care provider. What can I expect after the procedure? After the procedure, it is common to have:  Nausea.  Mild pain when you urinate. You may feel this pain in your lower back or lower abdomen. The pain should stop within a few minutes after you urinate. This may last for up to 1 week.  A small amount of blood in your urine for several days. Follow these instructions at home: Medicines  Take over-the-counter and prescription medicines only as told by your health care provider.  If you were prescribed an antibiotic medicine, take it as told by your health care provider. Do not stop taking the antibiotic even if you start to feel better.  Do not drive for 24 hours if you were given a sedative during your procedure.  Ask your health care provider if the medicine prescribed to you requires you to avoid driving or using heavy machinery. Activity  Rest as told by your health care provider.  Avoid sitting for a long time without moving. Get up to take short walks every 1-2 hours. This is important to improve blood flow and breathing. Ask for help if you feel weak or unsteady.  Return to your normal activities as told by your health care provider. Ask your health care provider what activities are safe for you. General instructions   Watch for any blood in your urine. Call your health care provider if the amount of blood in your urine increases.  If you have a catheter: ? Follow instructions from your health care provider about taking care of your catheter and collection bag. ? Do not take baths, swim, or use a hot tub until your health care provider approves. Ask your health care provider if you may take showers. You may only be allowed to  take sponge baths.  Drink enough fluid to keep your urine pale yellow.  Do not use any products that contain nicotine or tobacco, such as cigarettes, e-cigarettes, and chewing tobacco. These can delay healing after surgery. If you need help quitting, ask your health care provider.  Keep all follow-up visits as told by your health care provider. This is important. Contact a health care provider if:  You have pain that gets worse or does not get better with medicine, especially pain when you urinate.  You have difficulty urinating.  You feel nauseous or you vomit repeatedly during a period of more than 2 days after the procedure. Get help right away if:  Your urine is dark red or has blood clots in it.  You are leaking urine (have incontinence).  The end of the stent comes out of your urethra.  You cannot urinate.  You have sudden, sharp, or severe pain in your abdomen or lower back.  You have a fever.  You have swelling or pain in your legs.  You have difficulty breathing. Summary  After the procedure, it is common to have mild pain when you urinate that goes away within a few minutes after you urinate. This may last for up to 1 week.  Watch for any blood in your urine. Call your health care provider if the amount of blood in your urine increases.  Take over-the-counter and prescription medicines only as told by your health care provider.  Drink   enough fluid to keep your urine pale yellow.  Please bring the stone fragments to the office.    We will remove the stent when you return to the office.  This information is not intended to replace advice given to you by your health care provider. Make sure you discuss any questions you have with your health care provider. Document Revised: 04/23/2018 Document Reviewed: 04/24/2018 Elsevier Patient Education  2020 [place] Anesthesia, Adult, Care After This sheet gives you information about how to care for  yourself after your procedure. Your health care provider may also give you more specific instructions. If you have problems or questions, contact your health care provider. What can I expect after the procedure? After the procedure, the following side effects are common:  Pain or discomfort at the IV site.  Nausea.  Vomiting.  Sore throat.  Trouble concentrating.  Feeling cold or chills.  Weak or tired.  Sleepiness and fatigue.  Soreness and body aches. These side effects can affect parts of the body that were not involved in surgery. Follow these instructions at home:  For at least 24 hours after the procedure:  Have a responsible adult stay with you. It is important to have someone help care for you until you are awake and alert.  Rest as needed.  Do not: ? Participate in activities in which you could fall or become injured. ? Drive. ? Use heavy machinery. ? Drink alcohol. ? Take sleeping pills or medicines that cause drowsiness. ? Make important decisions or sign legal documents. ? Take care of children on your own. Eating and drinking  Follow any instructions from your health care provider about eating or drinking restrictions.  When you feel hungry, start by eating small amounts of foods that are soft and easy to digest (bland), such as toast. Gradually return to your regular diet.  Drink enough fluid to keep your urine pale yellow.  If you vomit, rehydrate by drinking water, juice, or clear broth. General instructions  If you have sleep apnea, surgery and certain medicines can increase your risk for breathing problems. Follow instructions from your health care provider about wearing your sleep device: ? Anytime you are sleeping, including during daytime naps. ? While taking prescription pain medicines, sleeping medicines, or medicines that make you drowsy.  Return to your normal activities as told by your health care provider. Ask your health care provider  what activities are safe for you.  Take over-the-counter and prescription medicines only as told by your health care provider.  If you smoke, do not smoke without supervision.  Keep all follow-up visits as told by your health care provider. This is important. Contact a health care provider if:  You have nausea or vomiting that does not get better with medicine.  You cannot eat or drink without vomiting.  You have pain that does not get better with medicine.  You are unable to pass urine.  You develop a skin rash.  You have a fever.  You have redness around your IV site that gets worse. Get help right away if:  You have difficulty breathing.  You have chest pain.  You have blood in your urine or stool, or you vomit blood. Summary  After the procedure, it is common to have a sore throat or nausea. It is also common to feel tired.  Have a responsible adult stay with you for the first 24 hours after general anesthesia. It is important to have someone  help care for you until you are awake and alert.  When you feel hungry, start by eating small amounts of foods that are soft and easy to digest (bland), such as toast. Gradually return to your regular diet.  Drink enough fluid to keep your urine pale yellow.  Return to your normal activities as told by your health care provider. Ask your health care provider what activities are safe for you. This information is not intended to replace advice given to you by your health care provider. Make sure you discuss any questions you have with your health care provider. Document Revised: 07/20/2017 Document Reviewed: 03/02/2017 Elsevier Patient Education  [place].

## 2020-01-05 NOTE — Anesthesia Preprocedure Evaluation (Addendum)
Anesthesia Evaluation  Patient identified by MRN, date of birth, ID band Patient awake    Reviewed: Allergy & Precautions, NPO status , Patient's Chart, lab work & pertinent test results  History of Anesthesia Complications (+) PONV and history of anesthetic complications  Airway Mallampati: II  TM Distance: >3 FB Neck ROM: Limited    Dental no notable dental hx.    Pulmonary sleep apnea and Continuous Positive Airway Pressure Ventilation ,    Pulmonary exam normal        Cardiovascular hypertension, Pt. on medications + CAD  Normal cardiovascular exam  Nuclear stress 12/2008: EF 63%, low risk study    Neuro/Psych Depression Hx of cervical fusion    GI/Hepatic Neg liver ROS, GERD  Medicated and Controlled,  Endo/Other  diabetes, Type 2, Oral Hypoglycemic Agents  Renal/GU Renal stones  negative genitourinary   Musculoskeletal negative musculoskeletal ROS (+)   Abdominal   Peds  Hematology  (+) anemia , Hgb 10.3   Anesthesia Other Findings Day of surgery medications reviewed with patient.  Reproductive/Obstetrics negative OB ROS                            Anesthesia Physical Anesthesia Plan  ASA: II  Anesthesia Plan: General   Post-op Pain Management:    Induction: Intravenous  PONV Risk Score and Plan: 4 or greater and Treatment may vary due to age or medical condition, Ondansetron, Dexamethasone and Midazolam  Airway Management Planned: LMA  Additional Equipment: None  Intra-op Plan:   Post-operative Plan: Extubation in OR  Informed Consent: I have reviewed the patients History and Physical, chart, labs and discussed the procedure including the risks, benefits and alternatives for the proposed anesthesia with the patient or authorized representative who has indicated his/her understanding and acceptance.     Dental advisory given  Plan Discussed with: CRNA  Anesthesia  Plan Comments:        Anesthesia Quick Evaluation

## 2020-01-05 NOTE — Transfer of Care (Signed)
Immediate Anesthesia Transfer of Care Note  Patient: [name]  Procedure(s) Performed: CYSTOSCOPY LEFT URETEROSCOPY/HOLMIUM LASER/STENT EXCHANGE (Left )  Patient Location: PACU  Anesthesia Type:General  Level of Consciousness: awake and alert   Airway & Oxygen Therapy: Patient Spontanous Breathing and Patient connected to face mask oxygen  Post-op Assessment: Report given to RN and Post -op Vital signs reviewed and stable  Post vital signs: Reviewed and stable  Last Vitals:  Vitals Value Taken Time  BP    Temp    Pulse 77 01/05/20 1820  Resp 16 01/05/20 1820  SpO2 100 % 01/05/20 1820  Vitals shown include unvalidated device data.  Last Pain:  Vitals:   01/05/20 1457  TempSrc: Oral  PainSc:       Patients Stated Pain Goal: 3 (53/96/72 [id number])  Complications: No apparent anesthesia complications

## 2020-01-05 NOTE — Interval H&P Note (Signed)
History and Physical Interval Note:  She is to have left ureteroscopy for her partially fragmented renal pelvic stone.   01/05/2020 4:22 PM  [name]  has presented today for surgery, with the diagnosis of LEFT RENAL STONE.  The various methods of treatment have been discussed with the patient and family. After consideration of risks, benefits and other options for treatment, the patient has consented to  Procedure(s): CYSTOSCOPY LEFT URETEROSCOPY/HOLMIUM LASER/STENT PLACEMENT (Left) as a surgical intervention.  The patient's history has been reviewed, patient examined, no change in status, stable for surgery.  I have reviewed the patient's chart and labs.  Questions were answered to the patient's satisfaction.     [name]

## 2020-01-05 NOTE — OR Nursing (Signed)
Stones taken by  Dr. Jeffie Pollock.

## 2020-01-05 NOTE — Op Note (Signed)
Procedure: 1.  Cystoscopy with removal of left double-J stent. 2.  Left ureteroscopic stone extraction with holmium laser application and insertion of left double-J stent. 3.  Application of fluoroscopy.  Preop diagnosis: 14 mm left renal pelvic stone with failed lithotripsy treatment.  Postop diagnosis: Same.  Surgeon: Dr. [name].  Anesthesia: General.  Specimen: Stone fragments.  EBL: 3 mL.  Complications: None.  Indications: The patient is a 69 year old female with a history of stones who underwent lithotripsy of a 14 mm left UVJ stone on 12/22/2019.  She had postoperative pain and on 12/23/2019 was found on imaging to have a small perirenal hematoma and for fragmentation of the renal pelvic stone.  A left ureteral stent was placed to relieve obstruction.  She returns now for definitive stone management.  Procedure: She was given Cipro.  She was taken operating room where general anesthetic was induced.  She was placed in lithotomy position and fitted with PAS hose.  Her perineum and genitalia were prepped with Betadine solution she was draped in usual sterile fashion.  Cystoscopy was performed using 23 [place] scope and 30 degree lens.  Examination revealed the stent loop at the left ureteral orifice which was grasped and removed to the urethral meatus.  Cystoscopic findings were previously documented at the initial stent placement.  The stent was then cannulated with a sensor wire which was passed to the kidney under fluoroscopic guidance and the stent was then removed.  At 35 cm digital access sheath inner core was then passed over the wire and easily went to the kidney adjacent to the stone.  The assembled sheath was then passed.  The inner core and wire were then removed.  The dual-lumen digital flexible scope was then passed to the kidney under fluoroscopic and visual guidance.  The stone was identified in the renal pelvis.  A 200 m tract tip holmium laser fiber was then passed  through the scope with the initial settings of 0.3 J and 50 Hz with a subsequent increase 2.5 J.  The stone fragmented readily and the manageable fragments which were then sequentially retrieved using the engage basket.  The removal followed by additional laser treatment was performed as needed until all but the very smallest fragments were noted in the mid lower pole calyces and there was minimal evidence of stone on fluoroscopy.  There was 1 stone approximately 5 mm in size in an upper calyx.  I was initially able to visualize the stone and attempted to basket it but there was infundibular stenosis which precluded removal.  After removal of all the of the fragments from the larger stone I returned to this area and attempted to visualize the stone to treated with laser but because of some adherent clot in the tightness of the infundibulum I was unable to further access the stone.  It was felt unlikely that the stone would make its way into the central collecting system in the future and further efforts to treat it were not attempted.  The ureteroscope was then removed and a sensor wire was passed to the kidney under fluoroscopic guidance.  The access sheath was removed and the cystoscope was reinserted over the wire.  A 6 French by 24 cm contour double-J stent was then advanced to the kidney under fluoroscopic guidance.  The wire was removed, leaving a good coil in the kidney and a good coil in the bladder.  Decision was made not to leave a tether due to the number of  passes required to remove the bulk of the stone material and some minor bleeding that persisted in the collecting system after completion.  The cystoscope was then removed after the bladder was drained.  She was taken down from lithotomy position, her anesthetic was reversed and she was moved recovery in stable condition.  There were no complications.

## 2020-01-06 DIAGNOSIS — G4733 Obstructive sleep apnea (adult) (pediatric): Secondary | ICD-10-CM | POA: Diagnosis not present

## 2020-01-07 ENCOUNTER — Other Ambulatory Visit: Payer: Self-pay | Admitting: Cardiology

## 2020-01-13 DIAGNOSIS — N200 Calculus of kidney: Secondary | ICD-10-CM | POA: Diagnosis not present

## 2020-01-14 DIAGNOSIS — F341 Dysthymic disorder: Secondary | ICD-10-CM | POA: Diagnosis not present

## 2020-01-14 DIAGNOSIS — N200 Calculus of kidney: Secondary | ICD-10-CM | POA: Diagnosis not present

## 2020-01-16 ENCOUNTER — Telehealth: Payer: Self-pay | Admitting: Cardiology

## 2020-01-16 DIAGNOSIS — N200 Calculus of kidney: Secondary | ICD-10-CM | POA: Diagnosis not present

## 2020-01-16 NOTE — Telephone Encounter (Signed)
BPs look good.

## 2020-01-16 NOTE — Telephone Encounter (Signed)
New message   B/p readings:  12/31/19 120/64 84  01/02/20 113/85 93 01/03/20 129/69 89 01/05/20 114/69 86  151/85 90 01/06/20  114/41 72 01/07/20 134/69 87  131/64 80 01/08/20  136/74 90  143/74 84 01/09/20 111/86 86    137/69 86 01/10/20 127/68 90 01/11/20 104/67 76  116/60 101 01/12/20 129/70 79 01/13/20 123/66 85  137/70 93 01/14/20 132/69 78  128/61 83 01/15/20 110/69 83  118/65 97 01/16/20 118/51 80   133/72 89

## 2020-01-20 ENCOUNTER — Encounter: Payer: Self-pay | Admitting: Cardiology

## 2020-01-20 ENCOUNTER — Telehealth (INDEPENDENT_AMBULATORY_CARE_PROVIDER_SITE_OTHER): Payer: Medicare PPO | Admitting: Cardiology

## 2020-01-20 ENCOUNTER — Other Ambulatory Visit: Payer: Self-pay

## 2020-01-20 VITALS — BP 142/77 | HR 80 | Ht 66.0 in | Wt 218.0 lb

## 2020-01-20 DIAGNOSIS — I2510 Atherosclerotic heart disease of native coronary artery without angina pectoris: Secondary | ICD-10-CM | POA: Diagnosis not present

## 2020-01-20 DIAGNOSIS — I10 Essential (primary) hypertension: Secondary | ICD-10-CM | POA: Diagnosis not present

## 2020-01-20 DIAGNOSIS — E7800 Pure hypercholesterolemia, unspecified: Secondary | ICD-10-CM | POA: Diagnosis not present

## 2020-01-20 DIAGNOSIS — R55 Syncope and collapse: Secondary | ICD-10-CM | POA: Diagnosis not present

## 2020-01-20 DIAGNOSIS — G4733 Obstructive sleep apnea (adult) (pediatric): Secondary | ICD-10-CM

## 2020-01-20 DIAGNOSIS — E669 Obesity, unspecified: Secondary | ICD-10-CM | POA: Diagnosis not present

## 2020-01-20 NOTE — Patient Instructions (Addendum)
Medication Instructions:  Your physician recommends that you continue on your current medications as directed. Please refer to the Current Medication list given to you today.  *If you need a refill on your cardiac medications before your next appointment, please call your pharmacy*  Testing/Procedures: Your physician has requested that you have an echocardiogram. Echocardiography is a painless test that uses sound waves to create images of your heart. It provides your doctor with information about the size and shape of your heart and how well your heart's chambers and valves are working. This procedure takes approximately one hour. There are no restrictions for this procedure.  Follow-Up: At [facility], you and your health needs are our priority.  As part of our continuing mission to provide you with exceptional heart care, we have created designated Provider Care Teams.  These Care Teams include your primary Cardiologist (physician) and Advanced Practice Providers (APPs -  Physician Assistants and Nurse Practitioners) who all work together to provide you with the care you need, when you need it.  We recommend signing up for the patient portal called "MyChart".  Sign up information is provided on this After Visit Summary.  MyChart is used to connect with patients for Virtual Visits (Telemedicine).  Patients are able to view lab/test results, encounter notes, upcoming appointments, etc.  Non-urgent messages can be sent to your provider as well.   To learn more about what you can do with MyChart, go to [email or web address].    Your next appointment:   1 year(s)  The format for your next appointment:   In Person  Provider:   You may see Fransico Him, MD or one of the following Advanced Practice Providers on your designated Care Team:    Melina Copa, PA-C  Ermalinda Barrios, PA-C  Other Instructions: You have been referred to the [facility]. You will be contacted to  set up an appointment.

## 2020-01-20 NOTE — Addendum Note (Signed)
Addended by: Antonieta Iba on: 01/20/2020 09:29 AM   Modules accepted: Orders

## 2020-01-20 NOTE — Progress Notes (Signed)
Virtual Visit via Telephone Note   This visit type was conducted due to national recommendations for restrictions regarding the COVID-19 Pandemic (e.g. social distancing) in an effort to limit this patient's exposure and mitigate transmission in our community.  Due to her co-morbid illnesses, this patient is at least at moderate risk for complications without adequate follow up.  This format is felt to be most appropriate for this patient at this time.  The patient did not have access to video technology/had technical difficulties with video requiring transitioning to audio format only (telephone).  All issues noted in this document were discussed and addressed.  No physical exam could be performed with this format.  Please refer to the patient's chart for her  consent to telehealth for [facility].   Evaluation Performed:  Follow-up visit  This visit type was conducted due to national recommendations for restrictions regarding the COVID-19 Pandemic (e.g. social distancing).  This format is felt to be most appropriate for this patient at this time.  All issues noted in this document were discussed and addressed.  No physical exam was performed (except for noted visual exam findings with Video Visits).  Please refer to the patient's chart (MyChart message for video visits and phone note for telephone visits) for the patient's consent to telehealth for [facility].  Date:  01/20/2020   ID:  [name], DOB 06-18-1951, MRN [id number]  Patient Location:  Home  Provider location:   [place]  PCP:  Kathyrn Lass, MD  Cardiologist:  Fransico Him, MD  Electrophysiologist:  None   Chief Complaint:  OSA/CAD/HTN/Obesity  History of Present Illness:    [name] is a 69 y.o. female who presents via audio/video conferencing for a telehealth visit today.    [name] a 69 y.o.femalewith a hx of nonobstructive ASCAD with 20% OM, HTN, dyslipidemia, and OSA on CPAP.   She is here today for followup and is doing well.  She has had some DOE with walking but has been very sedentary during the COVID crisis.  She has some intermittent ankle edema.  She denies any chest pain or pressure,  PND, orthopnea,  Dizziness (except when she rolls over in bed some), palpitations (except for a skipped beat once in a while) or syncope. She is compliant with her meds and is tolerating meds with no SE.     She is doing well with her CPAP device and thinks that she has gotten used to it.  She tolerates the nasal mask and feels the pressure is adequate.  Since going on CPAP she feels rested in the am and has no significant daytime sleepiness.  She denies any significant mouth or nasal dryness or nasal congestion.  She does not think that he snores.    The patient does not have symptoms concerning for COVID-19 infection (fever, chills, cough, or new shortness of breath).   Prior CV studies:   The following studies were reviewed today:  PAP compliance download  Past Medical History:  Diagnosis Date  . Arthritis   . Cancer (HCC)    hx of skin cancer   . Chronic fatigue    and weakness  . Colon polyps   . Coronary artery disease    nonobstructive with 20% OM by cath 2006  . Depression   . Diabetes mellitus without complication ([place])    type 2   . Diastolic dysfunction    w Elevated LVEDP  . DJD (degenerative joint disease)  C3/4 Dr Sherwood Gambler  . GERD (gastroesophageal reflux disease)   . High cholesterol   . History of kidney stones   . Hypertension   . Hypoglycemia   . Kidney stones   . Multiple thyroid nodules   . Neurocardiogenic syncope   . Obesity   . PONV (postoperative nausea and vomiting)   . Prediabetes 2011 & 2012  . Right knee pain 12/2009   Dr Marlou Sa  . Sleep apnea    CPAP had Sleep Study ordered by Dr. Radford Pax   . Tick bite 11/2016  . Vasovagal syncope    Past Surgical History:  Procedure Laterality Date  . ACHILLES TENDON SURGERY Left 01/23/2013    Procedure: EXCISION PARTIAL BONE TALUS/CALCANEUS, REPAIR RUPTURE ACHILLES TENDON PRIMARY OPEN ;  Surgeon: Ninetta Lights, MD;  Location: [place];  Service: Orthopedics;  Laterality: Left;  . ANTERIOR FUSION CERVICAL SPINE     x2-cervical  . BREAST BIOPSY Right 2007  . BREAST CYST EXCISION Left   . CARDIAC CATHETERIZATION  ~ 2008   Nonobstructive ASCAD, 20% OM  . CARPAL TUNNEL RELEASE Left   . CYSTOSCOPY W/ URETERAL STENT PLACEMENT Left 12/23/2019   Procedure: CYSTOSCOPY WITH RETROGRADE PYELOGRAM/URETERAL STENT PLACEMENT;  Surgeon: Ardis Hughs, MD;  Location: [facility];  Service: Urology;  Laterality: Left;  . CYSTOSCOPY/URETEROSCOPY/HOLMIUM LASER/STENT PLACEMENT Left 01/05/2020   Procedure: CYSTOSCOPY LEFT URETEROSCOPY/HOLMIUM LASER/STENT EXCHANGE;  Surgeon: [name], MD;  Location: [facility];  Service: Urology;  Laterality: Left;  . EXTRACORPOREAL SHOCK WAVE LITHOTRIPSY Left 12/22/2019   Procedure: LEFT EXTRACORPOREAL SHOCK WAVE LITHOTRIPSY (ESWL);  Surgeon: Ardis Hughs, MD;  Location: [facility];  Service: Urology;  Laterality: Left;  . LITHOTRIPSY    . OTHER SURGICAL HISTORY  06/12/2018   Left big toe nail surgery. [facility]   . TONSILLECTOMY    . TOTAL KNEE ARTHROPLASTY Right 11/2010  . TOTAL KNEE ARTHROPLASTY  12/13/2011   Procedure: TOTAL KNEE ARTHROPLASTY;  Surgeon: Ninetta Lights, MD;  Location: [place];  Service: Orthopedics;  Laterality: Left;  DR MURPHY WANTS 90 MINUTES FOR THIS CASE     Current Meds  Medication Sig  . ammonium lactate (LAC-HYDRIN) 12 % cream Apply topically as needed for dry skin.  Marland Kitchen aspirin 81 MG tablet Take 81 mg by mouth daily.  Marland Kitchen atorvastatin (LIPITOR) 40 MG tablet Take 1 tablet (40 mg total) by mouth daily.  Marland Kitchen CALCIUM PO Take 1,600 mg by mouth daily.   . Cholecalciferol (VITAMIN D3 PO) Take 2,000 Units by mouth daily.  . clonazePAM (KLONOPIN) 0.5 MG tablet Take 0.5 mg by mouth daily.   Marland Kitchen  desvenlafaxine (PRISTIQ) 50 MG 24 hr tablet Take 25 mg by mouth daily.   Marland Kitchen esomeprazole (NEXIUM) 40 MG capsule Take 1 capsule (40 mg total) by mouth 2 (two) times daily before a meal. (Patient taking differently: Take 40 mg by mouth as needed. )  . ezetimibe (ZETIA) 10 MG tablet Take 1 tablet (10 mg total) by mouth daily.  Marland Kitchen FERROUS SULFATE PO Take 65 mg by mouth daily.  Marland Kitchen ibuprofen (ADVIL,MOTRIN) 200 MG tablet Take 400 mg by mouth as needed (pain).   Marland Kitchen lisinopril (ZESTRIL) 10 MG tablet TAKE ONE TABLET BY MOUTH EVERY MORNING  . metFORMIN (GLUCOPHAGE) 500 MG tablet Take 500 mg by mouth 2 (two) times daily with a meal.   . Multiple Vitamins-Minerals (PRESERVISION AREDS 2 PO) Take 1 capsule by mouth daily.  . NONFORMULARY OR  COMPOUNDED ITEM Apply topically 2 (two) times daily. Salicylic Acid 15%, Urea 40% Cream, apply to the affected area once or twice daily  . Omega-3 Fatty Acids (FISH OIL) 1200 MG CAPS Take 3 capsules by mouth daily.  . tamsulosin (FLOMAX) 0.4 MG CAPS capsule Take 0.4 mg by mouth daily.  . traZODone (DESYREL) 50 MG tablet Take 200 mg by mouth at bedtime.   . vitamin B-12 1000 MCG tablet Take 1 tablet (1,000 mcg total) by mouth daily.     Allergies:   Guaifenesin & derivatives, Morphine and related, Penicillins, Tramadol, Hydrocodone-acetaminophen, and Oxycodone hcl   Social History   Tobacco Use  . Smoking status: Never Smoker  . Smokeless tobacco: Never Used  Vaping Use  . Vaping Use: Never used  Substance Use Topics  . Alcohol use: Yes    Alcohol/week: 6.0 standard drinks    Types: 6 Glasses of wine per week    Comment: couple times per week.  . Drug use: No     Family Hx: The patient's family history includes Breast cancer in her sister; CAD in her father; CVA in her mother; Cancer in her paternal aunt; Cancer - Prostate in her father; Colon cancer in an other family member; Crohn's disease in her paternal aunt; Diverticulosis in her father, mother, and sister;  Heart disease in her father and paternal grandfather; Hypertension in her sister; Kidney disease in her mother.  ROS:   Please see the history of present illness.     All other systems reviewed and are negative.   Labs/Other Tests and Data Reviewed:    Recent Labs: 12/24/2019: ALT 22; BUN 21; Creatinine, Ser 1.41; Hemoglobin 10.3; Magnesium 1.9; Platelets 171; Potassium 4.2; Sodium 139   Recent Lipid Panel Lab Results  Component Value Date/Time   CHOL 133 12/18/2019 10:56 AM   TRIG 158 (H) 12/18/2019 10:56 AM   HDL 48 12/18/2019 10:56 AM   CHOLHDL 2.8 12/18/2019 10:56 AM   CHOLHDL 3.9 08/05/2015 11:49 AM   LDLCALC 58 12/18/2019 10:56 AM   LDLDIRECT 93.3 09/17/2013 01:07 PM    Wt Readings from Last 3 Encounters:  01/20/20 218 lb (98.9 kg)  01/05/20 218 lb 6.4 oz (99.1 kg)  12/23/19 231 lb 0.7 oz (104.8 kg)     Objective:    Vital Signs:  BP (!) 142/77   Pulse 80   Ht 5\' 6"  (1.676 m)   Wt 218 lb (98.9 kg)   BMI 35.19 kg/m     ASSESSMENT & PLAN:    1.  OSA - The patient is tolerating PAP therapy well without any problems. The PAP download was reviewed today and showed an AHI of 2.5/hr on 11 cm H2O with 90% compliance in using more than 4 hours nightly.  The patient has been using and benefiting from PAP use and will continue to benefit from therapy.   2.  Hypertension  -BP controlled on exam -continue Lisinopril 10mg  daily  3.  Obesity  - I have encouraged her to get into a routine exercise program and cut back on carbs and portions.  - I referred her to the [facility] weight loss and healthy lifestyle program but has not gotten through yet  4.  Nonobstructive ASCAD  -cath in 2006 showed 20% OM.   -She has had some DOE when walking up an incline but once it levels out her SOB resolves.  This is a chronic problem.  Leane Call 12/2018 showed no ischemia -continue ASA and statin  5.   Hyperlipidemia  - her LDL goal is < 70.  - her LDL was 58 in May  -  continue Zetia 10mg  daily and Atorvastatin 40mg  daily  6.  Syncope -she had an episode recently of syncope that occurred in setting of severe back pain after a lithotripsy and then went to go to the bathroom and got nauseated and then passed out.  She got up and went to the bed and then passed out again. She was admitted and given IVF and found to have a kidney hematoma.  She then had ureteral stent.   -She has not had any further dizziness or syncope.  -sx c/w vasovagal syncope -I will check a 2D echo -I have asked her to let me know if she has further episodes   COVID-19 Education: The signs and symptoms of COVID-19 were discussed with the patient and how to seek care for testing (follow up with PCP or arrange E-visit).  The importance of social distancing was discussed today.  Patient Risk:   After full review of this patient's clinical status, I feel that they are at least moderate risk at this time.  Time:   Today, I have spent 20 minutes on telemedicine discussing medical problems including OSA, CAD, HTN, HLD and reviewing patient's chart including outside labs from PCP and PAP compliance download.  Medication Adjustments/Labs and Tests Ordered: Current medicines are reviewed at length with the patient today.  Concerns regarding medicines are outlined above.  Tests Ordered: No orders of the defined types were placed in this encounter.  Medication Changes: No orders of the defined types were placed in this encounter.   Disposition:  Follow up in 1 year(s)  Signed, Fransico Him, MD  01/20/2020 9:11 AM    [place]

## 2020-01-27 DIAGNOSIS — F411 Generalized anxiety disorder: Secondary | ICD-10-CM | POA: Diagnosis not present

## 2020-01-27 DIAGNOSIS — F900 Attention-deficit hyperactivity disorder, predominantly inattentive type: Secondary | ICD-10-CM | POA: Diagnosis not present

## 2020-01-27 DIAGNOSIS — F3341 Major depressive disorder, recurrent, in partial remission: Secondary | ICD-10-CM | POA: Diagnosis not present

## 2020-01-27 DIAGNOSIS — G4700 Insomnia, unspecified: Secondary | ICD-10-CM | POA: Diagnosis not present

## 2020-01-27 DIAGNOSIS — F422 Mixed obsessional thoughts and acts: Secondary | ICD-10-CM | POA: Diagnosis not present

## 2020-01-27 DIAGNOSIS — F341 Dysthymic disorder: Secondary | ICD-10-CM | POA: Diagnosis not present

## 2020-01-28 DIAGNOSIS — I2510 Atherosclerotic heart disease of native coronary artery without angina pectoris: Secondary | ICD-10-CM | POA: Diagnosis not present

## 2020-01-28 DIAGNOSIS — E1149 Type 2 diabetes mellitus with other diabetic neurological complication: Secondary | ICD-10-CM | POA: Diagnosis not present

## 2020-01-28 DIAGNOSIS — M1990 Unspecified osteoarthritis, unspecified site: Secondary | ICD-10-CM | POA: Diagnosis not present

## 2020-01-28 DIAGNOSIS — F329 Major depressive disorder, single episode, unspecified: Secondary | ICD-10-CM | POA: Diagnosis not present

## 2020-01-28 DIAGNOSIS — G4700 Insomnia, unspecified: Secondary | ICD-10-CM | POA: Diagnosis not present

## 2020-01-28 DIAGNOSIS — E1169 Type 2 diabetes mellitus with other specified complication: Secondary | ICD-10-CM | POA: Diagnosis not present

## 2020-01-28 DIAGNOSIS — D649 Anemia, unspecified: Secondary | ICD-10-CM | POA: Diagnosis not present

## 2020-01-28 DIAGNOSIS — E7800 Pure hypercholesterolemia, unspecified: Secondary | ICD-10-CM | POA: Diagnosis not present

## 2020-01-28 DIAGNOSIS — I10 Essential (primary) hypertension: Secondary | ICD-10-CM | POA: Diagnosis not present

## 2020-01-29 ENCOUNTER — Ambulatory Visit (INDEPENDENT_AMBULATORY_CARE_PROVIDER_SITE_OTHER): Payer: Medicare PPO | Admitting: Family Medicine

## 2020-01-29 ENCOUNTER — Encounter (INDEPENDENT_AMBULATORY_CARE_PROVIDER_SITE_OTHER): Payer: Self-pay

## 2020-02-03 ENCOUNTER — Encounter (INDEPENDENT_AMBULATORY_CARE_PROVIDER_SITE_OTHER): Payer: Self-pay | Admitting: Family Medicine

## 2020-02-03 ENCOUNTER — Ambulatory Visit (INDEPENDENT_AMBULATORY_CARE_PROVIDER_SITE_OTHER): Payer: Medicare PPO | Admitting: Family Medicine

## 2020-02-03 ENCOUNTER — Other Ambulatory Visit: Payer: Self-pay

## 2020-02-03 VITALS — BP 100/66 | HR 67 | Temp 98.3°F | Ht 66.0 in | Wt 218.0 lb

## 2020-02-03 DIAGNOSIS — D508 Other iron deficiency anemias: Secondary | ICD-10-CM

## 2020-02-03 DIAGNOSIS — R0602 Shortness of breath: Secondary | ICD-10-CM | POA: Diagnosis not present

## 2020-02-03 DIAGNOSIS — E119 Type 2 diabetes mellitus without complications: Secondary | ICD-10-CM | POA: Diagnosis not present

## 2020-02-03 DIAGNOSIS — R5383 Other fatigue: Secondary | ICD-10-CM | POA: Diagnosis not present

## 2020-02-03 DIAGNOSIS — E1159 Type 2 diabetes mellitus with other circulatory complications: Secondary | ICD-10-CM

## 2020-02-03 DIAGNOSIS — Z79899 Other long term (current) drug therapy: Secondary | ICD-10-CM

## 2020-02-03 DIAGNOSIS — I10 Essential (primary) hypertension: Secondary | ICD-10-CM

## 2020-02-03 DIAGNOSIS — E66812 Obesity, class 2: Secondary | ICD-10-CM

## 2020-02-03 DIAGNOSIS — Z6835 Body mass index (BMI) 35.0-35.9, adult: Secondary | ICD-10-CM

## 2020-02-03 DIAGNOSIS — F3289 Other specified depressive episodes: Secondary | ICD-10-CM

## 2020-02-03 DIAGNOSIS — Z0289 Encounter for other administrative examinations: Secondary | ICD-10-CM

## 2020-02-03 DIAGNOSIS — G4733 Obstructive sleep apnea (adult) (pediatric): Secondary | ICD-10-CM

## 2020-02-03 DIAGNOSIS — E1169 Type 2 diabetes mellitus with other specified complication: Secondary | ICD-10-CM

## 2020-02-03 DIAGNOSIS — E785 Hyperlipidemia, unspecified: Secondary | ICD-10-CM | POA: Diagnosis not present

## 2020-02-03 DIAGNOSIS — I152 Hypertension secondary to endocrine disorders: Secondary | ICD-10-CM

## 2020-02-04 DIAGNOSIS — F341 Dysthymic disorder: Secondary | ICD-10-CM | POA: Diagnosis not present

## 2020-02-04 LAB — COMPREHENSIVE METABOLIC PANEL
ALT: 15 IU/L (ref 0–32)
AST: 13 IU/L (ref 0–40)
Albumin/Globulin Ratio: 2.4 — ABNORMAL HIGH (ref 1.2–2.2)
Albumin: 4.6 g/dL (ref 3.8–4.8)
Alkaline Phosphatase: 101 IU/L (ref 48–121)
BUN/Creatinine Ratio: 20 (ref 12–28)
BUN: 18 mg/dL (ref 8–27)
Bilirubin Total: 0.2 mg/dL (ref 0.0–1.2)
CO2: 24 mmol/L (ref 20–29)
Calcium: 9.6 mg/dL (ref 8.7–10.3)
Chloride: 106 mmol/L (ref 96–106)
Creatinine, Ser: 0.9 mg/dL (ref 0.57–1.00)
GFR calc Af Amer: 75 mL/min/{1.73_m2} (ref >59)
GFR calc non Af Amer: 65 mL/min/{1.73_m2} (ref >59)
Globulin, Total: 1.9 g/dL (ref 1.5–4.5)
Glucose: 114 mg/dL — ABNORMAL HIGH (ref 65–99)
Potassium: 4.2 mmol/L (ref 3.5–5.2)
Sodium: 145 mmol/L — ABNORMAL HIGH (ref 134–144)
Total Protein: 6.5 g/dL (ref 6.0–8.5)

## 2020-02-04 LAB — ANEMIA PANEL
Ferritin: 157 ng/mL — ABNORMAL HIGH (ref 15–150)
Folate, Hemolysate: 497 ng/mL
Folate, RBC: 1343 ng/mL (ref >498)
Hematocrit: 37 % (ref 34.0–46.6)
Iron Saturation: 24 % (ref 15–55)
Iron: 63 ug/dL (ref 27–139)
Retic Ct Pct: 1.4 % (ref 0.6–2.6)
Total Iron Binding Capacity: 266 ug/dL (ref 250–450)
UIBC: 203 ug/dL (ref 118–369)
Vitamin B-12: 1170 pg/mL (ref 232–1245)

## 2020-02-04 LAB — CBC WITH DIFFERENTIAL/PLATELET
Basophils Absolute: 0 10*3/uL (ref 0.0–0.2)
Basos: 1 %
EOS (ABSOLUTE): 0.2 10*3/uL (ref 0.0–0.4)
Eos: 4 %
Hemoglobin: 11.9 g/dL (ref 11.1–15.9)
Immature Grans (Abs): 0 10*3/uL (ref 0.0–0.1)
Immature Granulocytes: 1 %
Lymphocytes Absolute: 1.1 10*3/uL (ref 0.7–3.1)
Lymphs: 21 %
MCH: 27.4 pg (ref 26.6–33.0)
MCHC: 32.2 g/dL (ref 31.5–35.7)
MCV: 85 fL (ref 79–97)
Monocytes Absolute: 0.4 10*3/uL (ref 0.1–0.9)
Monocytes: 8 %
Neutrophils Absolute: 3.6 10*3/uL (ref 1.4–7.0)
Neutrophils: 65 %
Platelets: 186 10*3/uL (ref 150–450)
RBC: 4.35 x10E6/uL (ref 3.77–5.28)
RDW: 14 % (ref 11.7–15.4)
WBC: 5.3 10*3/uL (ref 3.4–10.8)

## 2020-02-04 LAB — LIPID PANEL
Chol/HDL Ratio: 3.7 ratio (ref 0.0–4.4)
Cholesterol, Total: 165 mg/dL (ref 100–199)
HDL: 45 mg/dL (ref >39)
LDL Chol Calc (NIH): 90 mg/dL (ref 0–99)
Triglycerides: 176 mg/dL — ABNORMAL HIGH (ref 0–149)
VLDL Cholesterol Cal: 30 mg/dL (ref 5–40)

## 2020-02-04 LAB — HEMOGLOBIN A1C
Est. average glucose Bld gHb Est-mCnc: 137 mg/dL
Hgb A1c MFr Bld: 6.4 % — ABNORMAL HIGH (ref 4.8–5.6)

## 2020-02-04 LAB — INSULIN, RANDOM: INSULIN: 18.8 u[IU]/mL (ref 2.6–24.9)

## 2020-02-04 LAB — FOLATE: Folate: 11.9 ng/mL (ref >3.0)

## 2020-02-04 LAB — VITAMIN D 25 HYDROXY (VIT D DEFICIENCY, FRACTURES): Vit D, 25-Hydroxy: 43.6 ng/mL (ref 30.0–100.0)

## 2020-02-04 LAB — T4, FREE: Free T4: 1.06 ng/dL (ref 0.82–1.77)

## 2020-02-04 LAB — TSH: TSH: 2.72 u[IU]/mL (ref 0.450–4.500)

## 2020-02-04 LAB — T3, FREE: T3, Free: 2.9 pg/mL (ref 2.0–4.4)

## 2020-02-04 NOTE — Progress Notes (Signed)
Dear Dr. Radford Pax,   Thank you for referring [name] to our clinic. The following note includes my evaluation and treatment recommendations.   Chief Complaint:   OBESITY [name] (MR# [id number]) is a 69 y.o. female who presents for evaluation and treatment of obesity and related comorbidities. Current BMI is Body mass index is 35.19 kg/m. [name] has been struggling with her weight for many years and has been unsuccessful in either losing weight, maintaining weight loss, or reaching her healthy weight goal.  [name] is currently in the action stage of change and ready to dedicate time achieving and maintaining a healthier weight. [name] is interested in becoming our patient and working on intensive lifestyle modifications including (but not limited to) diet and exercise for weight loss.  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  She lives alone.  She was referred by Dr. Fransico Him of Cardiology.  Her PCP is Dr. Sabra Heck at [facility].  [name]'s sister also lives in [place] and they eat together every night. She does not move/exercise much.  She says she has had no exercise in the past 1-2 months.  She only eats breakfast and dinner.  She snacks at night.   [name]'s habits were reviewed today and are as follows: Her family eats meals together, she thinks her family will eat healthier with her, her desired weight loss is 40-80 pounds, she started gaining weight in her 30s, her heaviest weight ever was 235 pounds, she is a picky eater and doesn't like to eat healthier foods, she craves beef, pasta, chicken, dessert, and shellfish, she snacks frequently in the evenings, she skips lunch frequently, she frequently makes poor food choices, she frequently eats larger portions than normal and she struggles with emotional eating.  Depression Screen [name] (modified PHQ-9) score was 17.  Depression screen PHQ 2/9 02/03/2020  Decreased Interest 3   Down, Depressed, Hopeless 2  PHQ - 2 Score 5  Altered sleeping 1  Tired, decreased energy 2  Change in appetite 2  Feeling bad or failure about yourself  3  Trouble concentrating 3  Moving slowly or fidgety/restless 0  Suicidal thoughts 1  PHQ-9 Score 17  Difficult doing work/chores Very difficult  Some recent data might be hidden   Subjective:   1. Other fatigue [name] denies daytime somnolence and denies waking up still tired. Patent has a history of symptoms of OSA.  Now uses CPAP machine.  Fronie generally gets 8 or 9 hours of sleep per night, and states that she has generally restful sleep. Snoring is not present. Apneic episodes are not present. Epworth Sleepiness Score is 3.  2. Shortness of breath on exertion [name] notes increasing shortness of breath with exercising and seems to be worsening over time with weight gain. She notes getting out of breath sooner with activity than she used to. This has gotten worse recently. Eesha denies shortness of breath at rest or orthopnea.  3. Type 2 diabetes mellitus without complication, without long-term current use of insulin (HCC) [name] is taking metformin 500 mg twice daily.  A1c was less than 7.0 when last checked 3-4 months ago.  Lab Results  Component Value Date   HGBA1C 6.4 (H) 02/03/2020   HGBA1C 6.6 (H) 12/24/2019   Lab Results  Component Value Date   LDLCALC 90 02/03/2020   CREATININE 0.90 02/03/2020   Lab Results  Component Value Date   INSULIN 18.8 02/03/2020   4.  Hypertension associated with diabetes ([name]) Review: taking medications as instructed, no medication side effects noted, no chest pain on exertion, no dyspnea on exertion, no swelling of ankles.  She is taking lisinopril daily.  She recently decreased it from 20 mg back down to 10 mg daily.  She denies symptoms at all now.  Medication dose is directed by her cardiologist.  BP Readings from Last 3 Encounters:  02/03/20 100/66  01/20/20 (!) 142/77   01/05/20 124/72   5. Hyperlipidemia associated with type 2 diabetes mellitus ([place]) Makayah has hyperlipidemia and has been trying to improve her cholesterol levels with intensive lifestyle modification including a low saturated fat diet, exercise and weight loss. She denies any chest pain, claudication or myalgias.  She is taking Lipitor and Zetia.  Lab Results  Component Value Date   ALT 15 02/03/2020   AST 13 02/03/2020   ALKPHOS 101 02/03/2020   BILITOT 0.2 02/03/2020   Lab Results  Component Value Date   CHOL 165 02/03/2020   HDL 45 02/03/2020   LDLCALC 90 02/03/2020   LDLDIRECT 93.3 09/17/2013   TRIG 176 (H) 02/03/2020   CHOLHDL 3.7 02/03/2020   6. OSA (obstructive sleep apnea) [name] has a diagnosis of sleep apnea. She reports that she is using a CPAP regularly.   7. High risk medication use [name] is taking some high risk medications.  8. Other iron deficiency anemia [name] is not a vegetarian.  She does not have a history of weight loss surgery.  She is supposed to take a supplement daily, but she forgets a lot.  It also causes constipation.  CBC Latest Ref Rng & Units 02/03/2020 12/24/2019 12/23/2019  WBC 3.4 - 10.8 x10E3/uL 5.3 6.6 7.2  Hemoglobin 11.1 - 15.9 g/dL 11.9 10.3(L) 10.5(L)  Hematocrit 34.0 - 46.6 % 37.0 33.0(L) 33.7(L)  Platelets 150 - 450 x10E3/uL 186 171 176   Lab Results  Component Value Date   IRON 63 02/03/2020   TIBC 266 02/03/2020   FERRITIN 157 (H) 02/03/2020   Lab Results  Component Value Date   VITAMINB12 1,170 02/03/2020   9. Other depression with emotional eating [name] is struggling with emotional eating and using food for comfort to the extent that it is negatively impacting her health. She has been working on behavior modification techniques to help reduce her emotional eating and has been unsuccessful. She shows no sign of suicidal or homicidal ideations.  Assessment/Plan:   1. Other fatigue [name] does feel that her weight  is causing her energy to be lower than it should be. Fatigue may be related to obesity, depression or many other causes. Labs will be ordered, and in the meanwhile, Ainslee will focus on self care including making healthy food choices, increasing physical activity and focusing on stress reduction. - VITAMIN D 25 Hydroxy (Vit-D Deficiency, Fractures) - T3, free - T4, free - TSH  2. Shortness of breath on exertion [name] does feel that she gets out of breath more easily that she used to when she exercises. [name]'s shortness of breath appears to be obesity related and exercise induced. She has agreed to work on weight loss and gradually increase exercise to treat her exercise induced shortness of breath. Will continue to monitor closely.  3. Type 2 diabetes mellitus without complication, without long-term current use of insulin (HCC) [name] is important to decrease the likelihood of diabetic complications such as nephropathy, neuropathy, limb loss, blindness, coronary artery disease, and death. Intensive lifestyle modification  including diet, exercise and weight loss are the first line of treatment for diabetes.  - Hemoglobin A1c - Insulin, random  4. Hypertension associated with diabetes ([name]) [name] is working on healthy weight loss and exercise to improve blood pressure Hale. We will watch for signs of hypotension as she continues her lifestyle modifications. - Comprehensive metabolic panel  5. Hyperlipidemia associated with type 2 diabetes mellitus ([name]) Cardiovascular risk and specific lipid/LDL goals reviewed.  We discussed several lifestyle modifications today and [name] will continue to work on diet, exercise and weight loss efforts. Orders and follow up as documented in patient record.   Counseling Intensive lifestyle modifications are the first line treatment for this issue. . Dietary changes: Increase soluble fiber. Decrease simple carbohydrates. . Exercise  changes: Moderate to vigorous-intensity aerobic activity 150 minutes per week if tolerated. . Lipid-lowering medications: see documented in medical record. - Lipid panel  6. OSA (obstructive sleep apnea) Intensive lifestyle modifications are the first line treatment for this issue. We discussed several lifestyle modifications today and she will continue to work on diet, exercise and weight loss efforts. We will continue to monitor. Orders and follow up as documented in patient record.   Counseling  Sleep apnea is a condition in which breathing pauses or becomes shallow during sleep. This happens over and over during the night. This disrupts your sleep and keeps your body from getting the rest that it needs, which can cause tiredness and lack of energy (fatigue) during the day.  Sleep apnea treatment: If you were given a device to open your airway while you sleep, USE IT!  Sleep hygiene:   Limit or avoid alcohol, caffeinated beverages, and cigarettes, especially close to bedtime.   Do not eat a large meal or eat spicy foods right before bedtime. This can lead to digestive discomfort that can make it hard for you to sleep.  Keep a sleep diary to help you and your health care provider figure out what could be causing your insomnia.  . Make your bedroom a dark, comfortable place where it is easy to fall asleep. ? Put up shades or blackout curtains to block light from outside. ? Use a white noise machine to block noise. ? Keep the temperature cool. . Limit screen use before bedtime. This includes: ? Watching TV. ? Using your smartphone, tablet, or computer. . Stick to a routine that includes going to bed and waking up at the same times every day and night. This can help you fall asleep faster. Consider making a quiet activity, such as reading, part of your nighttime routine. . Try to avoid taking naps during the day so that you sleep better at night. . Get out of bed if you are still awake  after 15 minutes of trying to sleep. Keep the lights down, but try reading or doing a quiet activity. When you feel sleepy, go back to bed.  7. High risk medication use Will check labs today.  8. Other iron deficiency anemia Will check labs today.  Prudent nutritional plan.  Orders and follow up as documented in patient record.  Counseling . Iron is essential for our bodies to make red blood cells.  Reasons that someone may be deficient include: an iron-deficient diet (more likely in those following vegan or vegetarian diets), women with heavy menses, patients with GI disorders or poor absorption, patients that have had bariatric surgery, frequent blood donors, patients with cancer, and patients with heart disease.   [place] foods  include dark leafy greens, red and white meats, eggs, seafood, and beans.   . Certain foods and drinks prevent your body from absorbing iron properly. Avoid eating these foods in the same meal as iron-rich foods or with iron supplements. These foods include: coffee, black tea, and red wine; milk, dairy products, and foods that are high in calcium; beans and soybeans; whole grains.  . Constipation can be a side effect of iron supplementation. Increased water and fiber intake are helpful. Water goal: > 2 liters/day. Fiber goal: > 25 grams/day. - CBC with Differential/Platelet - Vitamin B12 - Folate - Anemia panel  9. Other depression with emotional eating Patient was referred to Dr. Mallie Mussel, our Bariatric Psychologist, for evaluation due to her elevated PHQ-9 score and significant struggles with emotional eating.  10. Class 2 severe obesity with serious comorbidity and body mass index (BMI) of 35.0 to 35.9 in adult, unspecified obesity type [name]) Fanchon is currently in the action stage of change and her goal is to continue with weight loss efforts. I recommend Ottie begin the structured treatment plan as follows:  She has agreed to the Category 1  Plan.  Exercise goals: As is.   Behavioral modification strategies: increasing lean protein intake, increasing water intake, no skipping meals and planning for success.  She was informed of the importance of frequent follow-up visits to maximize her success with intensive lifestyle modifications for her multiple health conditions. She was informed we would discuss her lab results at her next visit unless there is a critical issue that needs to be addressed sooner. Indica agreed to keep her next visit at the agreed upon time to discuss these results.  Objective:   Blood pressure 100/66, pulse 67, temperature 98.3 F (36.8 C), temperature source Oral, height 5\' 6"  (1.676 m), weight 218 lb (98.9 kg), SpO2 98 %. Body mass index is 35.19 kg/m.  Indirect Calorimeter completed today shows a VO2 of 135 and a REE of 939.  Her calculated basal metabolic rate is 8882 thus her basal metabolic rate is worse than expected.  General: Cooperative, alert, well developed, in no acute distress. HEENT: Conjunctivae and lids unremarkable. Cardiovascular: Regular rhythm.  Lungs: Normal work of breathing. Neurologic: No focal deficits.   Lab Results  Component Value Date   CREATININE 0.90 02/03/2020   BUN 18 02/03/2020   NA 145 (H) 02/03/2020   K 4.2 02/03/2020   CL 106 02/03/2020   CO2 24 02/03/2020   Lab Results  Component Value Date   ALT 15 02/03/2020   AST 13 02/03/2020   ALKPHOS 101 02/03/2020   BILITOT 0.2 02/03/2020   Lab Results  Component Value Date   HGBA1C 6.4 (H) 02/03/2020   HGBA1C 6.6 (H) 12/24/2019   Lab Results  Component Value Date   INSULIN 18.8 02/03/2020   Lab Results  Component Value Date   TSH 2.720 02/03/2020   Lab Results  Component Value Date   CHOL 165 02/03/2020   HDL 45 02/03/2020   LDLCALC 90 02/03/2020   LDLDIRECT 93.3 09/17/2013   TRIG 176 (H) 02/03/2020   CHOLHDL 3.7 02/03/2020   Lab Results  Component Value Date   WBC 5.3 02/03/2020   HGB  11.9 02/03/2020   HCT 37.0 02/03/2020   MCV 85 02/03/2020   PLT 186 02/03/2020   Lab Results  Component Value Date   IRON 63 02/03/2020   TIBC 266 02/03/2020   FERRITIN 157 (H) 02/03/2020   Obesity Behavioral Intervention Visit Documentation  for Insurance:   Approximately 15 minutes were spent on the discussion below.  ASK: We discussed the diagnosis of obesity with [name] today and Filippa agreed to give [name] permission to discuss obesity behavioral modification therapy today.  ASSESS: Cherrish has the diagnosis of obesity and her BMI today is 35.2. Dashawn is in the action stage of change.   ADVISE: Ronelle was educated on the multiple health risks of obesity as well as the benefit of weight loss to improve her health. She was advised of the need for long term treatment and the importance of lifestyle modifications to improve her current health and to decrease her risk of future health problems.  AGREE: Multiple dietary modification options and treatment options were discussed and Linsy agreed to follow the recommendations documented in the above note.  ARRANGE: [name] was educated on the importance of frequent visits to treat obesity as outlined per CMS and USPSTF guidelines and agreed to schedule her next follow up appointment today.  Attestation Statements:   Reviewed by clinician on day of visit: allergies, medications, problem list, medical history, surgical history, family history, social history, and previous encounter notes.  I, Water quality scientist, CMA, am acting as Location manager for [employer], DO.  I have reviewed the above documentation for accuracy and completeness, and I agree with the above. Mellody Dance, DO
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
## 2020-02-09 DIAGNOSIS — F329 Major depressive disorder, single episode, unspecified: Secondary | ICD-10-CM | POA: Diagnosis not present

## 2020-02-09 DIAGNOSIS — E7800 Pure hypercholesterolemia, unspecified: Secondary | ICD-10-CM | POA: Diagnosis not present

## 2020-02-09 DIAGNOSIS — G4700 Insomnia, unspecified: Secondary | ICD-10-CM | POA: Diagnosis not present

## 2020-02-09 DIAGNOSIS — E1149 Type 2 diabetes mellitus with other diabetic neurological complication: Secondary | ICD-10-CM | POA: Diagnosis not present

## 2020-02-09 DIAGNOSIS — D649 Anemia, unspecified: Secondary | ICD-10-CM | POA: Diagnosis not present

## 2020-02-09 DIAGNOSIS — E1169 Type 2 diabetes mellitus with other specified complication: Secondary | ICD-10-CM | POA: Diagnosis not present

## 2020-02-09 DIAGNOSIS — M1990 Unspecified osteoarthritis, unspecified site: Secondary | ICD-10-CM | POA: Diagnosis not present

## 2020-02-09 DIAGNOSIS — I10 Essential (primary) hypertension: Secondary | ICD-10-CM | POA: Diagnosis not present

## 2020-02-09 DIAGNOSIS — I2510 Atherosclerotic heart disease of native coronary artery without angina pectoris: Secondary | ICD-10-CM | POA: Diagnosis not present

## 2020-02-12 ENCOUNTER — Ambulatory Visit (HOSPITAL_COMMUNITY): Payer: Medicare PPO | Attending: Cardiovascular Disease

## 2020-02-12 ENCOUNTER — Other Ambulatory Visit: Payer: Self-pay

## 2020-02-12 DIAGNOSIS — R55 Syncope and collapse: Secondary | ICD-10-CM | POA: Insufficient documentation

## 2020-02-12 LAB — ECHOCARDIOGRAM COMPLETE
Area-P 1/2: 4.21 cm2
S' Lateral: 2.9 cm

## 2020-02-13 ENCOUNTER — Telehealth: Payer: Self-pay | Admitting: *Deleted

## 2020-02-13 DIAGNOSIS — I2510 Atherosclerotic heart disease of native coronary artery without angina pectoris: Secondary | ICD-10-CM

## 2020-02-13 DIAGNOSIS — R55 Syncope and collapse: Secondary | ICD-10-CM

## 2020-02-13 NOTE — Telephone Encounter (Signed)
-----   Message from [name], MD sent at 02/12/2020  5:18 PM EDT ----- Echo with normal LVF and moderate to severe septal hypertrophy with no obstruction.  Increased stiffness of heart muscle noted.  Mildly leaky MV. Due to hx of syncope and thickened LV septum, need to rule out HOCM.  Please set her up for a cardiac MRI with gad

## 2020-02-13 NOTE — Telephone Encounter (Signed)
Pt verbalized understanding of her Echo results and agreed to having a Cardiac MRI to r/o HOCM... results also sent to My Chart.   She had CMET 02/03/20. She reports no problem with an MRI she has had many in the past.

## 2020-02-13 NOTE — Telephone Encounter (Signed)
Follow up   Pt would like to speak with Nurse Fraser Din she said she has some questions

## 2020-02-13 NOTE — Telephone Encounter (Signed)
Follow up  ° ° °Pt returning call  °

## 2020-02-13 NOTE — Telephone Encounter (Signed)
Left message to call office

## 2020-02-13 NOTE — Telephone Encounter (Signed)
I spoke with patient and answered her questions regarding echo and MRI

## 2020-02-17 ENCOUNTER — Ambulatory Visit (INDEPENDENT_AMBULATORY_CARE_PROVIDER_SITE_OTHER): Payer: Medicare PPO | Admitting: Family Medicine

## 2020-02-17 ENCOUNTER — Encounter (INDEPENDENT_AMBULATORY_CARE_PROVIDER_SITE_OTHER): Payer: Self-pay | Admitting: Family Medicine

## 2020-02-17 ENCOUNTER — Other Ambulatory Visit: Payer: Self-pay

## 2020-02-17 VITALS — BP 115/63 | HR 90 | Temp 98.7°F | Ht 66.0 in | Wt 218.0 lb

## 2020-02-17 DIAGNOSIS — F419 Anxiety disorder, unspecified: Secondary | ICD-10-CM

## 2020-02-17 DIAGNOSIS — E1169 Type 2 diabetes mellitus with other specified complication: Secondary | ICD-10-CM

## 2020-02-17 DIAGNOSIS — E860 Dehydration: Secondary | ICD-10-CM

## 2020-02-17 DIAGNOSIS — E1159 Type 2 diabetes mellitus with other circulatory complications: Secondary | ICD-10-CM | POA: Diagnosis not present

## 2020-02-17 DIAGNOSIS — E785 Hyperlipidemia, unspecified: Secondary | ICD-10-CM

## 2020-02-17 DIAGNOSIS — Z794 Long term (current) use of insulin: Secondary | ICD-10-CM | POA: Diagnosis not present

## 2020-02-17 DIAGNOSIS — I152 Hypertension secondary to endocrine disorders: Secondary | ICD-10-CM

## 2020-02-17 DIAGNOSIS — I10 Essential (primary) hypertension: Secondary | ICD-10-CM | POA: Diagnosis not present

## 2020-02-17 DIAGNOSIS — Z9189 Other specified personal risk factors, not elsewhere classified: Secondary | ICD-10-CM

## 2020-02-17 DIAGNOSIS — Z6835 Body mass index (BMI) 35.0-35.9, adult: Secondary | ICD-10-CM

## 2020-02-18 ENCOUNTER — Telehealth: Payer: Self-pay | Admitting: *Deleted

## 2020-02-18 ENCOUNTER — Telehealth (INDEPENDENT_AMBULATORY_CARE_PROVIDER_SITE_OTHER): Payer: Medicare PPO | Admitting: Psychology

## 2020-02-18 ENCOUNTER — Encounter: Payer: Self-pay | Admitting: Cardiology

## 2020-02-18 DIAGNOSIS — F5089 Other specified eating disorder: Secondary | ICD-10-CM

## 2020-02-18 DIAGNOSIS — F331 Major depressive disorder, recurrent, moderate: Secondary | ICD-10-CM

## 2020-02-18 NOTE — Telephone Encounter (Signed)
Spoke with patient regarding appointment for Cardiac MRI scheduled Monday 03/29/20 at 2:00pm----arrival time is 1:30 pm 1st floor admissions office at [facility]. Will mail information to patient and she states she has seen this on My Chart

## 2020-02-18 NOTE — Progress Notes (Signed)
Chief Complaint:   OBESITY [name] is here to discuss her progress with her obesity treatment plan along with follow-up of her obesity related diagnoses. [name] is on the Category 1 Plan and states she is following her eating plan approximately 60% of the time. [name] states she is exercising for 0 minutes 0 times per week.  Today's visit was #: 2 Starting weight: 218 lbs Starting date: 02/03/2020 Today's weight: 218 lbs Today's date: 02/17/2020 Total lbs lost to date: 0 Total lbs lost since last in-office visit: 0  Interim History: [name] recently visited [place] for a couple days.  She ate out and off plan a lot.  She says that eating the same thing for breakfast is boring.  She could not recall one day where she followed the plan all day.  She is not weighing foods.  She has been eating with her sister, who adds a lot of mayo/cheeses and condiments to foods.  She reports eating breads, pastas, etc.    Subjective:   1. Type 2 diabetes mellitus with other specified complication, with long-term current use of insulin (HCC) Medications reviewed. Diabetic ROS: no polyuria or polydipsia, no chest pain, dyspnea or TIA's, no numbness, tingling or pain in extremities.  [name] does not check blood sugar and says she was never asked to by her other doctors.  No symptoms.  Metformin per PCP.  Lab Results  Component Value Date   HGBA1C 6.4 (H) 02/03/2020   HGBA1C 6.6 (H) 12/24/2019   Lab Results  Component Value Date   LDLCALC 90 02/03/2020   CREATININE 0.90 02/03/2020   Lab Results  Component Value Date   INSULIN 18.8 02/03/2020   2. Hypertension associated with diabetes ([place]) Review: taking medications as instructed, no medication side effects noted, no chest pain on exertion, no dyspnea on exertion, no swelling of ankles.  She has a way to check blood pressure at home.  Runs 120-140/80s, but she has not checked it in awhile.  No symptoms or concerns.  Lisinopril per PCP.  BP  Readings from Last 3 Encounters:  02/17/20 115/63  02/03/20 100/66  01/20/20 (!) 142/77   3. Mild dehydration She is not drinking much water now.  4. Hyperlipidemia associated with type 2 diabetes mellitus ([place]) [name] has hyperlipidemia and has been trying to improve her cholesterol levels with intensive lifestyle modification including a low saturated fat diet, exercise and weight loss. She denies any chest pain, claudication or myalgias.  LDL is not at goal of less than 70 and triglycerides are elevated.  She is taking Zetia, Lipitor and fish oil.  Lab Results  Component Value Date   ALT 15 02/03/2020   AST 13 02/03/2020   ALKPHOS 101 02/03/2020   BILITOT 0.2 02/03/2020   Lab Results  Component Value Date   CHOL 165 02/03/2020   HDL 45 02/03/2020   LDLCALC 90 02/03/2020   LDLDIRECT 93.3 09/17/2013   TRIG 176 (H) 02/03/2020   CHOLHDL 3.7 02/03/2020   5. Anxiety She is on medications per PCP/specialist (Pristiq, Klonopin, trazodone).  Her mood appears stable.  Her thyroid labs were within normal limits.  Reassured her of this.  6. At risk for hypoglycemia [name] is at increased risk for hypoglycemia due to changes in diet, diagnosis of diabetes, and/or insulin use. [name] is not currently taking insulin.   Assessment/Plan:   1. Type 2 diabetes mellitus with other specified complication, with long-term current use of insulin ([place]) Discussed labs with patient  today.  Good blood sugar control is important to decrease the likelihood of diabetic complications such as nephropathy, neuropathy, limb loss, blindness, coronary artery disease, and death. Intensive lifestyle modification including diet, exercise and weight loss are the first line of treatment for diabetes.  Check fasting and 2 hour postprandial blood sugar (after biggest meal).  She should also check if she is feeling poorly.  Call PCP about supplies.  2. Hypertension associated with diabetes ([place]) Discussed labs with  patient today.  Ambert is working on healthy weight loss and exercise to improve blood pressure control. We will watch for signs of hypotension as she continues her lifestyle modifications.  Blood pressure is at goal today.  Encouraged home blood pressure monitoring, especially with weight loss as it will go down.  Continue prudent nutritional plan, weight loss.  3. Mild dehydration Discussed labs with patient today.  Try to increase water intake.  4. Hyperlipidemia associated with type 2 diabetes mellitus ([place]) Discussed labs with patient today.  Cardiovascular risk and specific lipid/LDL goals reviewed.  We discussed several lifestyle modifications today and [name] will continue to work on diet, exercise and weight loss efforts. Orders and follow up as documented in patient record.  Medications per PCP.  Continue prudent nutritional plan, weight loss in hopes to improve LDL and triglycerides.  Counseling Intensive lifestyle modifications are the first line treatment for this issue. . Dietary changes: Increase soluble fiber. Decrease simple carbohydrates. . Exercise changes: Moderate to vigorous-intensity aerobic activity 150 minutes per week if tolerated. . Lipid-lowering medications: see documented in medical record.  5. Anxiety Discussed labs with patient today.  Continue medications per PCP/specialists.  6. At risk for hypoglycemia [name] was given approximately 15 minutes of counseling today regarding prevention of hypoglycemia. She was advised of symptoms of hypoglycemia. [name] was instructed to avoid skipping meals, eat regular protein rich meals and schedule low calorie snacks as needed.   Repetitive spaced learning was employed today to elicit superior memory formation and behavioral change  7. Class 2 severe obesity with serious comorbidity and body mass index (BMI) of 35.0 to 35.9 in adult, unspecified obesity type [facility]) [name] is currently in the action stage of change. As  such, her goal is to continue with weight loss efforts. She has agreed to the Category 1 Plan with additional breakfast options (added [employer] Protein Cereal).   Exercise goals: As is.  Behavioral modification strategies: increasing lean protein intake, decreasing simple carbohydrates, increasing water intake, decreasing sodium intake, decreasing eating out, meal planning and cooking strategies, keeping healthy foods in the home, dealing with family or coworker sabotage and planning for success.  Bryanda has agreed to follow-up with our clinic in 2 weeks. She was informed of the importance of frequent follow-up visits to maximize her success with intensive lifestyle modifications for her multiple health conditions.   Objective:   Blood pressure 115/63, pulse 90, temperature 98.7 F (37.1 C), temperature source Oral, height 5\' 6"  (1.676 m), weight 218 lb (98.9 kg), SpO2 95 %. Body mass index is 35.19 kg/m.  General: Cooperative, alert, well developed, in no acute distress. HEENT: Conjunctivae and lids unremarkable. Cardiovascular: Regular rhythm.  Lungs: Normal work of breathing. Neurologic: No focal deficits.   Lab Results  Component Value Date   CREATININE 0.90 02/03/2020   BUN 18 02/03/2020   NA 145 (H) 02/03/2020   K 4.2 02/03/2020   CL 106 02/03/2020   CO2 24 02/03/2020   Lab Results  Component Value Date  ALT 15 02/03/2020   AST 13 02/03/2020   ALKPHOS 101 02/03/2020   BILITOT 0.2 02/03/2020   Lab Results  Component Value Date   HGBA1C 6.4 (H) 02/03/2020   HGBA1C 6.6 (H) 12/24/2019   Lab Results  Component Value Date   INSULIN 18.8 02/03/2020   Lab Results  Component Value Date   TSH 2.720 02/03/2020   Lab Results  Component Value Date   CHOL 165 02/03/2020   HDL 45 02/03/2020   LDLCALC 90 02/03/2020   LDLDIRECT 93.3 09/17/2013   TRIG 176 (H) 02/03/2020   CHOLHDL 3.7 02/03/2020   Lab Results  Component Value Date   WBC 5.3 02/03/2020   HGB 11.9  02/03/2020   HCT 37.0 02/03/2020   MCV 85 02/03/2020   PLT 186 02/03/2020   Lab Results  Component Value Date   IRON 63 02/03/2020   TIBC 266 02/03/2020   FERRITIN 157 (H) 02/03/2020   Attestation Statements:   Reviewed by clinician on day of visit: allergies, medications, problem list, medical history, surgical history, family history, social history, and previous encounter notes.  I, Water quality scientist, CMA, am acting as Location manager for [employer], DO.  I have reviewed the above documentation for accuracy and completeness, and I agree with the above. Mellody Dance, DO

## 2020-02-29 ENCOUNTER — Other Ambulatory Visit: Payer: Self-pay | Admitting: Cardiology

## 2020-03-02 ENCOUNTER — Other Ambulatory Visit (HOSPITAL_COMMUNITY): Payer: Medicare PPO

## 2020-03-02 DIAGNOSIS — N200 Calculus of kidney: Secondary | ICD-10-CM | POA: Diagnosis not present

## 2020-03-03 ENCOUNTER — Other Ambulatory Visit: Payer: Self-pay

## 2020-03-03 ENCOUNTER — Telehealth (INDEPENDENT_AMBULATORY_CARE_PROVIDER_SITE_OTHER): Payer: Medicare PPO | Admitting: Psychology

## 2020-03-03 ENCOUNTER — Encounter (INDEPENDENT_AMBULATORY_CARE_PROVIDER_SITE_OTHER): Payer: Self-pay | Admitting: Physician Assistant

## 2020-03-03 ENCOUNTER — Ambulatory Visit (INDEPENDENT_AMBULATORY_CARE_PROVIDER_SITE_OTHER): Payer: Medicare PPO | Admitting: Physician Assistant

## 2020-03-03 VITALS — BP 110/71 | HR 80 | Temp 98.7°F | Ht 66.0 in | Wt 215.0 lb

## 2020-03-03 DIAGNOSIS — F5089 Other specified eating disorder: Secondary | ICD-10-CM

## 2020-03-03 DIAGNOSIS — E669 Obesity, unspecified: Secondary | ICD-10-CM | POA: Diagnosis not present

## 2020-03-03 DIAGNOSIS — E7849 Other hyperlipidemia: Secondary | ICD-10-CM | POA: Diagnosis not present

## 2020-03-03 DIAGNOSIS — E785 Hyperlipidemia, unspecified: Secondary | ICD-10-CM | POA: Diagnosis not present

## 2020-03-03 DIAGNOSIS — E1169 Type 2 diabetes mellitus with other specified complication: Secondary | ICD-10-CM | POA: Diagnosis not present

## 2020-03-03 DIAGNOSIS — F331 Major depressive disorder, recurrent, moderate: Secondary | ICD-10-CM | POA: Diagnosis not present

## 2020-03-03 DIAGNOSIS — Z6834 Body mass index (BMI) 34.0-34.9, adult: Secondary | ICD-10-CM | POA: Diagnosis not present

## 2020-03-03 NOTE — Progress Notes (Signed)
  Office: [phone]  /  Fax: [phone]    Date: March 03, 2020    Appointment Start Time: 3:40pm Duration: 33 minutes Provider: Glennie Isle, Psy.D. Type of Session: Individual Therapy  Location of Patient: Home Location of Provider: Provider's Home Type of Contact: Telepsychological Visit via MyChart Video Visit  Session Content: [name] is a 69 y.o. female presenting for a follow-up appointment to address the previously established treatment goal of increasing coping skills. Today's appointment was a telepsychological visit due to COVID-19. [name] provided verbal consent for today's telepsychological appointment and she is aware she is responsible for securing confidentiality on her end of the session. Prior to proceeding with today's appointment, [name]'s physical location at the time of this appointment was obtained as well a phone number she could be reached at in the event of technical difficulties. [name] and this provider participated in today's telepsychological service.   This provider conducted a brief check-in. [name] stated the last appointment with this provider "put [her] in a bad mood" due to topics discussed, adding she is "fine now." [name] shared she lost three pounds. She explained she was surprised as she deviated form the meal plan and consumed chocolate. Psychoeducation regarding triggers for emotional eating was provided. [name] was provided a handout, and encouraged to utilize the handout between now and the next appointment to increase awareness of triggers and frequency. [name] agreed. This provider also discussed behavioral strategies for specific triggers, such as placing the utensil down when conversing to avoid mindless eating. [name] provided verbal consent during today's appointment for this provider to send a handout for triggers via e-mail. [name] was receptive to today's appointment as evidenced by openness to sharing, responsiveness to feedback, and  willingness to explore triggers for emotional eating.  Mental Status Examination:  Appearance: well groomed and appropriate hygiene  Behavior: appropriate to circumstances Mood: euthymic Affect: mood congruent Speech: normal in rate, volume, and tone Eye Contact: appropriate Psychomotor Activity: appropriate Gait: unable to assess Thought Process: linear, logical, and goal directed  Thought Content/Perception: no hallucinations, delusions, bizarre thinking or behavior reported or observed and no evidence of suicidal and homicidal ideation, plan, and intent Orientation: time, person, place, and purpose of appointment Memory/Concentration: memory, attention, language, and fund of knowledge intact  Insight/Judgment: good  Interventions:  Conducted a brief chart review Provided empathic reflections and validation Employed supportive psychotherapy interventions to facilitate reduced distress and to improve coping skills with identified stressors Psychoeducation provided regarding triggers for emotional eating  DSM-5 Diagnosis(es): 296.32 (F33.1) Major Depressive Disorder, Recurrent Episode, Moderate and 307.59 (F50.8) Other Specified Feeding or Eating Disorder, Emotional Eating Behaviors   Treatment Goal & Progress: During the initial appointment with this provider, the following treatment goal was established: increase coping skills. [name] has demonstrated progress in her goal as evidenced by increased awareness of hunger patterns.   Plan: The next appointment will be scheduled in 2-3 weeks, which will be via MyChart Video Visit. The next session will focus on working towards the established treatment goal.

## 2020-03-04 NOTE — Progress Notes (Signed)
Chief Complaint:   OBESITY [name] is here to discuss her progress with her obesity treatment plan along with follow-up of her obesity related diagnoses. [name] is on the Category 1 Plan and states she is following her eating plan approximately 60% of the time. [name] states she is walking 25 minutes 2-3 times per week.  Today's visit was #: 3 Starting weight: 218 lbs Starting date: 02/03/2020 Today's weight: 215 lbs Today's date: 03/03/2020 Total lbs lost to date: 3 Total lbs lost since last in-office visit: 3  Interim History: [name] states that she wakes up around 11:00 to 12:00 p.m. and eats breakfast. She eats lunch around 3:00 to 4:00 p.m. and then eats dinner with her sister around 8:00 p.m. She reports getting cravings after dinner.  Subjective:   Type 2 diabetes mellitus with hyperlipidemia ([place]). Fariha is on metformin. No nausea, vomiting, diarrhea, or polyphagia.    Lab Results  Component Value Date   HGBA1C 6.4 (H) 02/03/2020   HGBA1C 6.6 (H) 12/24/2019   Lab Results  Component Value Date   LDLCALC 90 02/03/2020   CREATININE 0.90 02/03/2020   Lab Results  Component Value Date   INSULIN 18.8 02/03/2020   Other hyperlipidemia. [name] is on Crestor and Zetia.  Lab Results  Component Value Date   CHOL 165 02/03/2020   HDL 45 02/03/2020   LDLCALC 90 02/03/2020   LDLDIRECT 93.3 09/17/2013   TRIG 176 (H) 02/03/2020   CHOLHDL 3.7 02/03/2020   Lab Results  Component Value Date   ALT 15 02/03/2020   AST 13 02/03/2020   ALKPHOS 101 02/03/2020   BILITOT 0.2 02/03/2020   The 10-year ASCVD risk score Mikey Bussing DC Jr., et al., 2013) is: 17.2%   Values used to calculate the score:     Age: 3 years     Sex: Female     Is Non-Hispanic African American: No     Diabetic: Yes     Tobacco smoker: No     Systolic Blood Pressure: 177 mmHg     Is BP treated: Yes     HDL Cholesterol: 45 mg/dL     Total Cholesterol: 165 mg/dL  Assessment/Plan:    Type 2 diabetes mellitus with hyperlipidemia ([place]). Good blood sugar control is important to decrease the likelihood of diabetic complications such as nephropathy, neuropathy, limb loss, blindness, coronary artery disease, and death. Intensive lifestyle modification including diet, exercise and weight loss are the first line of treatment for diabetes. Shantae will continue her medication as directed.   Other hyperlipidemia. Cardiovascular risk and specific lipid/LDL goals reviewed.  We discussed several lifestyle modifications today and Tanishia will continue to work on diet, exercise and weight loss efforts. Orders and follow up as documented in patient record. She will continue her medications as directed.   Counseling Intensive lifestyle modifications are the first line treatment for this issue. . Dietary changes: Increase soluble fiber. Decrease simple carbohydrates. . Exercise changes: Moderate to vigorous-intensity aerobic activity 150 minutes per week if tolerated. . Lipid-lowering medications: see documented in medical record.  Class 1 obesity with serious comorbidity and body mass index (BMI) of 34.0 to 34.9 in adult, unspecified obesity type.  [name] is currently in the action stage of change. As such, her goal is to continue with weight loss efforts. She has agreed to the Category 1 Plan. She will break up dinner and eat 2 oz protein later in the evening and then snack.  Exercise goals: Older  adults should follow the adult guidelines. When older adults cannot meet the adult guidelines, they should be as physically active as their abilities and conditions will allow.   Behavioral modification strategies: increasing lean protein intake and meal planning and cooking strategies.  [name] has agreed to follow-up with our clinic in 2 weeks. She was informed of the importance of frequent follow-up visits to maximize her success with intensive lifestyle modifications for her multiple health  conditions.   Objective:   Blood pressure 110/71, pulse 80, temperature 98.7 F (37.1 C), temperature source Oral, height 5\' 6"  (1.676 m), weight 215 lb (97.5 kg), SpO2 95 %. Body mass index is 34.7 kg/m.  General: Cooperative, alert, well developed, in no acute distress. HEENT: Conjunctivae and lids unremarkable. Cardiovascular: Regular rhythm.  Lungs: Normal work of breathing. Neurologic: No focal deficits.   Lab Results  Component Value Date   CREATININE 0.90 02/03/2020   BUN 18 02/03/2020   NA 145 (H) 02/03/2020   K 4.2 02/03/2020   CL 106 02/03/2020   CO2 24 02/03/2020   Lab Results  Component Value Date   ALT 15 02/03/2020   AST 13 02/03/2020   ALKPHOS 101 02/03/2020   BILITOT 0.2 02/03/2020   Lab Results  Component Value Date   HGBA1C 6.4 (H) 02/03/2020   HGBA1C 6.6 (H) 12/24/2019   Lab Results  Component Value Date   INSULIN 18.8 02/03/2020   Lab Results  Component Value Date   TSH 2.720 02/03/2020   Lab Results  Component Value Date   CHOL 165 02/03/2020   HDL 45 02/03/2020   LDLCALC 90 02/03/2020   LDLDIRECT 93.3 09/17/2013   TRIG 176 (H) 02/03/2020   CHOLHDL 3.7 02/03/2020   Lab Results  Component Value Date   WBC 5.3 02/03/2020   HGB 11.9 02/03/2020   HCT 37.0 02/03/2020   MCV 85 02/03/2020   PLT 186 02/03/2020   Lab Results  Component Value Date   IRON 63 02/03/2020   TIBC 266 02/03/2020   FERRITIN 157 (H) 02/03/2020   Obesity Behavioral Intervention Documentation for Insurance:   Approximately 15 minutes were spent on the discussion below.  ASK: We discussed the diagnosis of obesity with [name] today and [name] agreed to give [name] permission to discuss obesity behavioral modification therapy today.  ASSESS: [name] has the diagnosis of obesity and her BMI today is 34.7. [name] is in the action stage of change.   ADVISE: [name] was educated on the multiple health risks of obesity as well as the benefit of weight loss to  improve her health. She was advised of the need for long term treatment and the importance of lifestyle modifications to improve her current health and to decrease her risk of future health problems.  AGREE: Multiple dietary modification options and treatment options were discussed and Alazia agreed to follow the recommendations documented in the above note.  ARRANGE: Achsah was educated on the importance of frequent visits to treat obesity as outlined per CMS and USPSTF guidelines and agreed to schedule her next follow up appointment today.  Attestation Statements:   Reviewed by clinician on day of visit: allergies, medications, problem list, medical history, surgical history, family history, social history, and previous encounter notes.  IMichaelene Song, am acting as transcriptionist for Abby Potash, PA-C   I have reviewed the above documentation for accuracy and completeness, and I agree with the above. Abby Potash, PA-C

## 2020-03-05 DIAGNOSIS — N200 Calculus of kidney: Secondary | ICD-10-CM | POA: Diagnosis not present

## 2020-03-09 NOTE — Progress Notes (Unsigned)
Office: [phone]  /  Fax: [phone]    Date: March 23, 2020   Appointment Start Time: *** Duration: *** minutes Provider: Glennie Isle, Psy.D. Type of Session: Individual Therapy  Location of Patient: {gbptloc:23249} Location of Provider: {Location of Service:22491} Type of Contact: Telepsychological Visit via MyChart Video Visit  Session Content: [name] is a 69 y.o. female presenting for a follow-up appointment to address the previously established treatment goal of increasing coping skills. Today's appointment was a telepsychological visit due to COVID-19. [name] provided verbal consent for today's telepsychological appointment and she is aware she is responsible for securing confidentiality on her end of the session. Prior to proceeding with today's appointment, [name] physical location at the time of this appointment was obtained as well a phone number she could be reached at in the event of technical difficulties. [name] and this provider participated in today's telepsychological service.   This provider conducted a brief check-in and verbally administered the PHQ-9 and GAD-7. *** [name] was receptive to today's appointment as evidenced by openness to sharing, responsiveness to feedback, and {gbreceptiveness:23401}.  Mental Status Examination:  Appearance: {Appearance:22431} Behavior: {Behavior:22445} Mood: {gbmood:21757} Affect: {Affect:22436} Speech: {Speech:22432} Eye Contact: {Eye Contact:22433} Psychomotor Activity: {Motor Activity:22434} Gait: {gbgait:23404} Thought Process: {thought process:22448}  Thought Content/Perception: {disturbances:22451} Orientation: {Orientation:22437} Memory/Concentration: {gbcognition:22449} Insight/Judgment: {Insight:22446}  Structured Assessments Results: The Patient Health Questionnaire-9 (PHQ-9) is a self-report measure that assesses symptoms and severity of depression over the course of the last two weeks. [name]  obtained a score of *** suggesting {GBPHQ9SEVERITY:21752}. [name] finds the endorsed symptoms to be {gbphq9difficulty:21754}. [0= Not at all; 1= Several days; 2= More than half the days; 3= Nearly every day] Little interest or pleasure in doing things ***  Feeling down, depressed, or hopeless ***  Trouble falling or staying asleep, or sleeping too much ***  Feeling tired or having little energy ***  Poor appetite or overeating ***  Feeling bad about yourself --- or that you are a failure or have let yourself or your family down ***  Trouble concentrating on things, such as reading the newspaper or watching television ***  Moving or speaking so slowly that other people could have noticed? Or the opposite --- being so fidgety or restless that you have been moving around a lot more than usual ***  Thoughts that you would be better off dead or hurting yourself in some way ***  PHQ-9 Score ***    The Generalized Anxiety Disorder-7 (GAD-7) is a brief self-report measure that assesses symptoms of anxiety over the course of the last two weeks. [name] obtained a score of *** suggesting {gbgad7severity:21753}. [name] finds the endorsed symptoms to be {gbphq9difficulty:21754}. [0= Not at all; 1= Several days; 2= Over half the days; 3= Nearly every day] Feeling nervous, anxious, on edge ***  Not being able to stop or control worrying ***  Worrying too much about different things ***  Trouble relaxing ***  Being so restless that it's hard to sit still ***  Becoming easily annoyed or irritable ***  Feeling afraid as if something awful might happen ***  GAD-7 Score ***   Interventions:  {Interventions for Progress Notes:23405}  DSM-5 Diagnosis(es): 296.32 (F33.1) Major Depressive Disorder, Recurrent Episode, Moderate and 307.59 (F50.8) Other Specified Feeding or Eating Disorder, Emotional Eating Behaviors   Treatment Goal & Progress: During the initial appointment with this provider, the following  treatment goal was established: increase coping skills. [name] has demonstrated progress in her goal as evidenced by {gbtxprogress:22839}. [name] also {gbtxprogress2:22951}.  Plan: The next appointment will be scheduled in {gbweeks:21758}, which will be {gbtxmodality:23402}. The next session will focus on {Plan for Next Appointment:23400}.

## 2020-03-12 DIAGNOSIS — F341 Dysthymic disorder: Secondary | ICD-10-CM | POA: Diagnosis not present

## 2020-03-17 DIAGNOSIS — N200 Calculus of kidney: Secondary | ICD-10-CM | POA: Diagnosis not present

## 2020-03-18 ENCOUNTER — Other Ambulatory Visit: Payer: Self-pay

## 2020-03-18 ENCOUNTER — Encounter (INDEPENDENT_AMBULATORY_CARE_PROVIDER_SITE_OTHER): Payer: Self-pay | Admitting: Physician Assistant

## 2020-03-18 ENCOUNTER — Ambulatory Visit (INDEPENDENT_AMBULATORY_CARE_PROVIDER_SITE_OTHER): Payer: Medicare PPO | Admitting: Physician Assistant

## 2020-03-18 VITALS — BP 107/70 | HR 82 | Temp 98.9°F | Ht 66.0 in | Wt 216.0 lb

## 2020-03-18 DIAGNOSIS — E785 Hyperlipidemia, unspecified: Secondary | ICD-10-CM

## 2020-03-18 DIAGNOSIS — E669 Obesity, unspecified: Secondary | ICD-10-CM | POA: Diagnosis not present

## 2020-03-18 DIAGNOSIS — E1169 Type 2 diabetes mellitus with other specified complication: Secondary | ICD-10-CM | POA: Diagnosis not present

## 2020-03-18 DIAGNOSIS — E559 Vitamin D deficiency, unspecified: Secondary | ICD-10-CM | POA: Diagnosis not present

## 2020-03-18 DIAGNOSIS — Z6834 Body mass index (BMI) 34.0-34.9, adult: Secondary | ICD-10-CM

## 2020-03-22 NOTE — Progress Notes (Signed)
Chief Complaint:   [name] is here to discuss her progress with her Carol treatment plan along with follow-up of her Carol related diagnoses. [name] is on the Category 1 Plan and states she is following her eating plan approximately 80% of the time. [name] states she is exercising 0 minutes 0 times per week.  Today's visit was #: 4 Starting weight: 218 lbs Starting date: 02/03/2020 Today's weight: 216 lbs Today's date: 03/18/2020 Total lbs lost to date: 2 Total lbs lost since last in-office visit: 0  Interim History: [name] states that she continues to have potatoes with dinner when she eats with her sister. She continues to want snacks after dinner.  Subjective:   Vitamin D deficiency. [name] is on OTC Vitamin D supplementation daily.   Ref. Range 02/03/2020 15:46  Vitamin D, 25-Hydroxy Latest Ref Range: 30.0 - 100.0 ng/mL 43.6   Type 2 diabetes mellitus with hyperlipidemia ([name]). [name] is on metformin. She is not checking her blood sugars at home. She does report polyphagia and cravings.   Lab Results  Component Value Date   HGBA1C 6.4 (H) 02/03/2020   HGBA1C 6.6 (H) 12/24/2019   Lab Results  Component Value Date   LDLCALC 90 02/03/2020   CREATININE 0.90 02/03/2020   Lab Results  Component Value Date   INSULIN 18.8 02/03/2020   Assessment/Plan:   Vitamin D deficiency. Low Vitamin D level contributes to fatigue and are associated with Carol, breast, and colon cancer. She agrees to continue to take OTC Vitamin D as directed and will follow-up for routine testing of Vitamin D, at least 2-3 times per year to avoid over-replacement.  Type 2 diabetes mellitus with hyperlipidemia ([name]). Good blood sugar control is important to decrease the likelihood of diabetic complications such as nephropathy, neuropathy, limb loss, blindness, coronary artery disease, and death. Intensive lifestyle modification including diet, exercise and weight loss are  the first line of treatment for diabetes. Tamasha will follow-up with her PCP about possible change to a GLP-1.  Class 1 Carol with serious comorbidity and body mass index (BMI) of 34.0 to 34.9 in adult, unspecified Carol type.  [name] is currently in the action stage of change. As such, her goal is to continue with weight loss efforts. She has agreed to the Category 1 Plan.   Exercise goals: Older adults should follow the adult guidelines. When older adults cannot meet the adult guidelines, they should be as physically active as their abilities and conditions will allow.   Behavioral modification strategies: meal planning and cooking strategies and dealing with family or coworker sabotage.  [name] has agreed to follow-up with our clinic in 2 weeks. She was informed of the importance of frequent follow-up visits to maximize her success with intensive lifestyle modifications for her multiple health conditions.   Objective:   Blood pressure 107/70, pulse 82, temperature 98.9 F (37.2 C), temperature source Oral, height 5\' 6"  (1.676 m), weight 216 lb (98 kg), SpO2 95 %. Body mass index is 34.86 kg/m.  General: Cooperative, alert, well developed, in no acute distress. HEENT: Conjunctivae and lids unremarkable. Cardiovascular: Regular rhythm.  Lungs: Normal work of breathing. Neurologic: No focal deficits.   Lab Results  Component Value Date   CREATININE 0.90 02/03/2020   BUN 18 02/03/2020   NA 145 (H) 02/03/2020   K 4.2 02/03/2020   CL 106 02/03/2020   CO2 24 02/03/2020   Lab Results  Component Value Date   ALT 15 02/03/2020  AST 13 02/03/2020   ALKPHOS 101 02/03/2020   BILITOT 0.2 02/03/2020   Lab Results  Component Value Date   HGBA1C 6.4 (H) 02/03/2020   HGBA1C 6.6 (H) 12/24/2019   Lab Results  Component Value Date   INSULIN 18.8 02/03/2020   Lab Results  Component Value Date   TSH 2.720 02/03/2020   Lab Results  Component Value Date   CHOL 165 02/03/2020    HDL 45 02/03/2020   LDLCALC 90 02/03/2020   LDLDIRECT 93.3 09/17/2013   TRIG 176 (H) 02/03/2020   CHOLHDL 3.7 02/03/2020   Lab Results  Component Value Date   WBC 5.3 02/03/2020   HGB 11.9 02/03/2020   HCT 37.0 02/03/2020   MCV 85 02/03/2020   PLT 186 02/03/2020   Lab Results  Component Value Date   IRON 63 02/03/2020   TIBC 266 02/03/2020   FERRITIN 157 (H) 02/03/2020   Carol Behavioral Intervention Documentation for Insurance:   Approximately 15 minutes were spent on the discussion below.  ASK: We discussed the diagnosis of Carol with [name] today and [name] agreed to give [name] permission to discuss Carol behavioral modification therapy today.  ASSESS: [name] has the diagnosis of Carol and her BMI today is 34.9. [name] is in the action stage of change.   ADVISE: [name] was educated on the multiple health risks of Carol as well as the benefit of weight loss to improve her health. She was advised of the need for long term treatment and the importance of lifestyle modifications to improve her current health and to decrease her risk of future health problems.  AGREE: Multiple dietary modification options and treatment options were discussed and [name] agreed to follow the recommendations documented in the above note.  ARRANGE: [name] was educated on the importance of frequent visits to treat Carol as outlined per CMS and USPSTF guidelines and agreed to schedule her next follow up appointment today.  Attestation Statements:   Reviewed by clinician on day of visit: allergies, medications, problem list, medical history, surgical history, family history, social history, and previous encounter notes.  IMichaelene Song, am acting as transcriptionist for Abby Potash, PA-C   I have reviewed the above documentation for accuracy and completeness, and I agree with the above. Abby Potash, PA-C

## 2020-03-23 ENCOUNTER — Other Ambulatory Visit: Payer: Self-pay

## 2020-03-23 ENCOUNTER — Encounter (INDEPENDENT_AMBULATORY_CARE_PROVIDER_SITE_OTHER): Payer: Self-pay

## 2020-03-23 ENCOUNTER — Telehealth (INDEPENDENT_AMBULATORY_CARE_PROVIDER_SITE_OTHER): Payer: Medicare PPO | Admitting: Psychology

## 2020-03-23 NOTE — Progress Notes (Signed)
  Office: [phone]  /  Fax: [phone]    Date: March 24, 2020   Appointment Start Time: 2:10pm Duration: 23 minutes Provider: Glennie Isle, Psy.D. Type of Session: Individual Therapy  Location of Patient: Home Location of Provider: Provider's Home Type of Contact: Telepsychological Visit via MyChart Video Visit  Session Content: [name] is a 69 y.o. female presenting for a follow-up appointment to address the previously established treatment goal of increasing coping skills. Today's appointment was a telepsychological visit due to COVID-19. [name] provided verbal consent for today's telepsychological appointment and she is aware she is responsible for securing confidentiality on her end of the session. Prior to proceeding with today's appointment, [name]'s physical location at the time of this appointment was obtained as well a phone number she could be reached at in the event of technical difficulties. [name] and this provider participated in today's telepsychological service.   This provider conducted a brief check-in. [name] shared about recent events. She noted a decrease in physical activity due to the heat. She also acknowledged challenges with snacking after dinner. This was further explored. This provider and [name] discussed going to sleep a little earlier as she is staying up late and likely experiencing physical hunger. Psychoeducation regarding pleasurable activities, including its impact on emotional eating and overall well-being was also provided. [name] was provided with a handout with various options of pleasurable activities, and was encouraged to engage in one activity a day and additional activities as needed when triggered to emotionally eat. [name] agreed. [name] provided verbal consent during today's appointment for this provider to send a handout with pleasurable activities via e-mail. [name] was receptive to today's appointment as evidenced by openness to  sharing, responsiveness to feedback, and willingness to engage in pleasurable activities to assist with coping.  Mental Status Examination:  Appearance: well groomed and appropriate hygiene  Behavior: appropriate to circumstances Mood: euthymic Affect: mood congruent Speech: normal in rate, volume, and tone Eye Contact: appropriate Psychomotor Activity: appropriate Gait: unable to assess Thought Process: linear, logical, and goal directed  Thought Content/Perception: no hallucinations, delusions, bizarre thinking or behavior reported or observed and no evidence of suicidal and homicidal ideation, plan, and intent Orientation: time, person, place, and purpose of appointment Memory/Concentration: memory, attention, language, and fund of knowledge intact  Insight/Judgment: fair  Interventions:  Conducted a brief chart review Provided empathic reflections and validation Employed supportive psychotherapy interventions to facilitate reduced distress and to improve coping skills with identified stressors Engaged patient in problem solving Psychoeducation provided regarding pleasurable activities  DSM-5 Diagnosis(es): 296.32 (F33.1) Major Depressive Disorder, Recurrent Episode, Moderate and 307.59 (F50.8) Other Specified Feeding or Eating Disorder, Emotional Eating Behaviors   Treatment Goal & Progress: During the initial appointment with this provider, the following treatment goal was established: increase coping skills. [name] has demonstrated progress in her goal as evidenced by increased awareness of hunger patterns and increased awareness of triggers for emotional eating. [name] also demonstrates willingness to engage in pleasurable activities.  Plan: Based on appointment availability and [name]'s schedule, the next appointment will be scheduled in 3-4 weeks, which will be via MyChart Video Visit. The next session will focus on working towards the established treatment goal.

## 2020-03-24 ENCOUNTER — Telehealth (INDEPENDENT_AMBULATORY_CARE_PROVIDER_SITE_OTHER): Payer: Medicare PPO | Admitting: Psychology

## 2020-03-24 ENCOUNTER — Other Ambulatory Visit: Payer: Self-pay

## 2020-03-24 DIAGNOSIS — D649 Anemia, unspecified: Secondary | ICD-10-CM | POA: Diagnosis not present

## 2020-03-24 DIAGNOSIS — F331 Major depressive disorder, recurrent, moderate: Secondary | ICD-10-CM

## 2020-03-24 DIAGNOSIS — G4700 Insomnia, unspecified: Secondary | ICD-10-CM | POA: Diagnosis not present

## 2020-03-24 DIAGNOSIS — F5089 Other specified eating disorder: Secondary | ICD-10-CM | POA: Diagnosis not present

## 2020-03-24 DIAGNOSIS — E1149 Type 2 diabetes mellitus with other diabetic neurological complication: Secondary | ICD-10-CM | POA: Diagnosis not present

## 2020-03-24 DIAGNOSIS — E7800 Pure hypercholesterolemia, unspecified: Secondary | ICD-10-CM | POA: Diagnosis not present

## 2020-03-24 DIAGNOSIS — I2510 Atherosclerotic heart disease of native coronary artery without angina pectoris: Secondary | ICD-10-CM | POA: Diagnosis not present

## 2020-03-24 DIAGNOSIS — I10 Essential (primary) hypertension: Secondary | ICD-10-CM | POA: Diagnosis not present

## 2020-03-24 DIAGNOSIS — F341 Dysthymic disorder: Secondary | ICD-10-CM | POA: Diagnosis not present

## 2020-03-24 DIAGNOSIS — F329 Major depressive disorder, single episode, unspecified: Secondary | ICD-10-CM | POA: Diagnosis not present

## 2020-03-24 DIAGNOSIS — M1990 Unspecified osteoarthritis, unspecified site: Secondary | ICD-10-CM | POA: Diagnosis not present

## 2020-03-24 DIAGNOSIS — E1169 Type 2 diabetes mellitus with other specified complication: Secondary | ICD-10-CM | POA: Diagnosis not present

## 2020-03-26 ENCOUNTER — Telehealth (HOSPITAL_COMMUNITY): Payer: Self-pay | Admitting: Emergency Medicine

## 2020-03-26 DIAGNOSIS — H2513 Age-related nuclear cataract, bilateral: Secondary | ICD-10-CM | POA: Diagnosis not present

## 2020-03-26 DIAGNOSIS — H524 Presbyopia: Secondary | ICD-10-CM | POA: Diagnosis not present

## 2020-03-26 NOTE — Telephone Encounter (Signed)
Reaching out to patient to offer assistance regarding upcoming cardiac imaging study; pt verbalizes understanding of appt date/time, parking situation and where to check in, and verified current allergies; name and call back number provided for further questions should they arise Marchia Bond RN Navigator Cardiac Imaging [facility] [phone] office [phone] cell   Denies implants other than ortho, denies claustro

## 2020-03-29 ENCOUNTER — Ambulatory Visit (HOSPITAL_COMMUNITY)
Admission: RE | Admit: 2020-03-29 | Discharge: 2020-03-29 | Disposition: A | Discharge status: Home / Self Care | Payer: Medicare PPO | Source: Ambulatory Visit | Attending: Cardiology | Admitting: Cardiology

## 2020-03-29 ENCOUNTER — Other Ambulatory Visit: Payer: Self-pay

## 2020-03-29 ENCOUNTER — Telehealth: Payer: Self-pay | Admitting: Cardiology

## 2020-03-29 DIAGNOSIS — I2510 Atherosclerotic heart disease of native coronary artery without angina pectoris: Secondary | ICD-10-CM | POA: Diagnosis not present

## 2020-03-29 DIAGNOSIS — R55 Syncope and collapse: Secondary | ICD-10-CM | POA: Diagnosis not present

## 2020-03-29 MED ORDER — GADOBUTROL 1 MMOL/ML IV SOLN
11.0000 mL | Freq: Once | INTRAVENOUS | Status: AC | PRN
  Administered 2020-03-29: 11 mL via INTRAVENOUS

## 2020-03-29 NOTE — Telephone Encounter (Signed)
Left message for patient to call and discuss rescheduling 03/29/20 Cardiac MRI appointment

## 2020-03-31 DIAGNOSIS — N200 Calculus of kidney: Secondary | ICD-10-CM | POA: Diagnosis not present

## 2020-03-31 DIAGNOSIS — I2510 Atherosclerotic heart disease of native coronary artery without angina pectoris: Secondary | ICD-10-CM | POA: Diagnosis not present

## 2020-04-01 ENCOUNTER — Ambulatory Visit (INDEPENDENT_AMBULATORY_CARE_PROVIDER_SITE_OTHER): Payer: Medicare PPO | Admitting: Physician Assistant

## 2020-04-07 DIAGNOSIS — G4733 Obstructive sleep apnea (adult) (pediatric): Secondary | ICD-10-CM | POA: Diagnosis not present

## 2020-04-12 DIAGNOSIS — F341 Dysthymic disorder: Secondary | ICD-10-CM | POA: Diagnosis not present

## 2020-04-14 DIAGNOSIS — F900 Attention-deficit hyperactivity disorder, predominantly inattentive type: Secondary | ICD-10-CM | POA: Diagnosis not present

## 2020-04-14 DIAGNOSIS — G4700 Insomnia, unspecified: Secondary | ICD-10-CM | POA: Diagnosis not present

## 2020-04-14 DIAGNOSIS — F411 Generalized anxiety disorder: Secondary | ICD-10-CM | POA: Diagnosis not present

## 2020-04-14 DIAGNOSIS — F422 Mixed obsessional thoughts and acts: Secondary | ICD-10-CM | POA: Diagnosis not present

## 2020-04-14 DIAGNOSIS — F341 Dysthymic disorder: Secondary | ICD-10-CM | POA: Diagnosis not present

## 2020-04-15 ENCOUNTER — Telehealth (INDEPENDENT_AMBULATORY_CARE_PROVIDER_SITE_OTHER): Payer: Self-pay | Admitting: Psychology

## 2020-04-26 DIAGNOSIS — E1169 Type 2 diabetes mellitus with other specified complication: Secondary | ICD-10-CM | POA: Diagnosis not present

## 2020-04-26 DIAGNOSIS — E1149 Type 2 diabetes mellitus with other diabetic neurological complication: Secondary | ICD-10-CM | POA: Diagnosis not present

## 2020-04-26 DIAGNOSIS — F329 Major depressive disorder, single episode, unspecified: Secondary | ICD-10-CM | POA: Diagnosis not present

## 2020-04-26 DIAGNOSIS — E7800 Pure hypercholesterolemia, unspecified: Secondary | ICD-10-CM | POA: Diagnosis not present

## 2020-04-26 DIAGNOSIS — M1990 Unspecified osteoarthritis, unspecified site: Secondary | ICD-10-CM | POA: Diagnosis not present

## 2020-04-26 DIAGNOSIS — I2510 Atherosclerotic heart disease of native coronary artery without angina pectoris: Secondary | ICD-10-CM | POA: Diagnosis not present

## 2020-04-26 DIAGNOSIS — G4700 Insomnia, unspecified: Secondary | ICD-10-CM | POA: Diagnosis not present

## 2020-04-26 DIAGNOSIS — I10 Essential (primary) hypertension: Secondary | ICD-10-CM | POA: Diagnosis not present

## 2020-04-26 DIAGNOSIS — D649 Anemia, unspecified: Secondary | ICD-10-CM | POA: Diagnosis not present

## 2020-05-05 DIAGNOSIS — F341 Dysthymic disorder: Secondary | ICD-10-CM | POA: Diagnosis not present

## 2020-05-25 DIAGNOSIS — I10 Essential (primary) hypertension: Secondary | ICD-10-CM | POA: Diagnosis not present

## 2020-05-25 DIAGNOSIS — D649 Anemia, unspecified: Secondary | ICD-10-CM | POA: Diagnosis not present

## 2020-05-25 DIAGNOSIS — E7800 Pure hypercholesterolemia, unspecified: Secondary | ICD-10-CM | POA: Diagnosis not present

## 2020-05-25 DIAGNOSIS — E1149 Type 2 diabetes mellitus with other diabetic neurological complication: Secondary | ICD-10-CM | POA: Diagnosis not present

## 2020-05-25 DIAGNOSIS — M1990 Unspecified osteoarthritis, unspecified site: Secondary | ICD-10-CM | POA: Diagnosis not present

## 2020-05-25 DIAGNOSIS — E1169 Type 2 diabetes mellitus with other specified complication: Secondary | ICD-10-CM | POA: Diagnosis not present

## 2020-05-25 DIAGNOSIS — F329 Major depressive disorder, single episode, unspecified: Secondary | ICD-10-CM | POA: Diagnosis not present

## 2020-05-25 DIAGNOSIS — I2510 Atherosclerotic heart disease of native coronary artery without angina pectoris: Secondary | ICD-10-CM | POA: Diagnosis not present

## 2020-05-25 DIAGNOSIS — G4700 Insomnia, unspecified: Secondary | ICD-10-CM | POA: Diagnosis not present

## 2020-05-26 ENCOUNTER — Telehealth: Payer: Self-pay | Admitting: Cardiology

## 2020-05-26 MED ORDER — EZETIMIBE 10 MG PO TABS: 10.0000 mg | ORAL_TABLET | Freq: Every day | ORAL | 2 refills | Status: DC

## 2020-05-26 NOTE — Telephone Encounter (Signed)
    *  STAT* If patient is at the pharmacy, call can be transferred to refill team.   1. Which medications need to be refilled? (please list name of each medication and dose if known)   ezetimibe (ZETIA) 10 MG tablet     2. Which pharmacy/location (including street and city if local pharmacy) is medication to be sent to? [facility] - [place], [place] - [address]   3. Do they need a 30 day or 90 day supply? 90 days 

## 2020-05-26 NOTE — Telephone Encounter (Signed)
Pt's medication was sent to pt's pharmacy as requested. Confirmation received.  °

## 2020-06-07 DIAGNOSIS — N200 Calculus of kidney: Secondary | ICD-10-CM | POA: Diagnosis not present

## 2020-06-14 DIAGNOSIS — N200 Calculus of kidney: Secondary | ICD-10-CM | POA: Diagnosis not present

## 2020-06-21 DIAGNOSIS — G4700 Insomnia, unspecified: Secondary | ICD-10-CM | POA: Diagnosis not present

## 2020-06-21 DIAGNOSIS — I10 Essential (primary) hypertension: Secondary | ICD-10-CM | POA: Diagnosis not present

## 2020-06-21 DIAGNOSIS — D649 Anemia, unspecified: Secondary | ICD-10-CM | POA: Diagnosis not present

## 2020-06-21 DIAGNOSIS — E1169 Type 2 diabetes mellitus with other specified complication: Secondary | ICD-10-CM | POA: Diagnosis not present

## 2020-06-21 DIAGNOSIS — E7800 Pure hypercholesterolemia, unspecified: Secondary | ICD-10-CM | POA: Diagnosis not present

## 2020-06-21 DIAGNOSIS — K219 Gastro-esophageal reflux disease without esophagitis: Secondary | ICD-10-CM | POA: Diagnosis not present

## 2020-06-21 DIAGNOSIS — I2510 Atherosclerotic heart disease of native coronary artery without angina pectoris: Secondary | ICD-10-CM | POA: Diagnosis not present

## 2020-06-21 DIAGNOSIS — M1990 Unspecified osteoarthritis, unspecified site: Secondary | ICD-10-CM | POA: Diagnosis not present

## 2020-06-21 DIAGNOSIS — F329 Major depressive disorder, single episode, unspecified: Secondary | ICD-10-CM | POA: Diagnosis not present

## 2020-06-28 DIAGNOSIS — F341 Dysthymic disorder: Secondary | ICD-10-CM | POA: Diagnosis not present

## 2020-06-28 DIAGNOSIS — N200 Calculus of kidney: Secondary | ICD-10-CM | POA: Diagnosis not present

## 2020-07-06 DIAGNOSIS — G4733 Obstructive sleep apnea (adult) (pediatric): Secondary | ICD-10-CM | POA: Diagnosis not present

## 2020-07-27 DIAGNOSIS — I2510 Atherosclerotic heart disease of native coronary artery without angina pectoris: Secondary | ICD-10-CM | POA: Diagnosis not present

## 2020-07-27 DIAGNOSIS — F329 Major depressive disorder, single episode, unspecified: Secondary | ICD-10-CM | POA: Diagnosis not present

## 2020-07-27 DIAGNOSIS — G4700 Insomnia, unspecified: Secondary | ICD-10-CM | POA: Diagnosis not present

## 2020-07-27 DIAGNOSIS — E1149 Type 2 diabetes mellitus with other diabetic neurological complication: Secondary | ICD-10-CM | POA: Diagnosis not present

## 2020-07-27 DIAGNOSIS — E1169 Type 2 diabetes mellitus with other specified complication: Secondary | ICD-10-CM | POA: Diagnosis not present

## 2020-07-27 DIAGNOSIS — M1990 Unspecified osteoarthritis, unspecified site: Secondary | ICD-10-CM | POA: Diagnosis not present

## 2020-07-27 DIAGNOSIS — E7800 Pure hypercholesterolemia, unspecified: Secondary | ICD-10-CM | POA: Diagnosis not present

## 2020-07-27 DIAGNOSIS — K219 Gastro-esophageal reflux disease without esophagitis: Secondary | ICD-10-CM | POA: Diagnosis not present

## 2020-07-27 DIAGNOSIS — I10 Essential (primary) hypertension: Secondary | ICD-10-CM | POA: Diagnosis not present

## 2020-08-03 DIAGNOSIS — F341 Dysthymic disorder: Secondary | ICD-10-CM | POA: Diagnosis not present

## 2020-08-23 DIAGNOSIS — F341 Dysthymic disorder: Secondary | ICD-10-CM | POA: Diagnosis not present

## 2020-08-24 DIAGNOSIS — Z03818 Encounter for observation for suspected exposure to other biological agents ruled out: Secondary | ICD-10-CM | POA: Diagnosis not present

## 2020-08-24 DIAGNOSIS — Z20822 Contact with and (suspected) exposure to covid-19: Secondary | ICD-10-CM | POA: Diagnosis not present

## 2020-08-31 DIAGNOSIS — E1169 Type 2 diabetes mellitus with other specified complication: Secondary | ICD-10-CM | POA: Diagnosis not present

## 2020-08-31 DIAGNOSIS — D649 Anemia, unspecified: Secondary | ICD-10-CM | POA: Diagnosis not present

## 2020-08-31 DIAGNOSIS — E7800 Pure hypercholesterolemia, unspecified: Secondary | ICD-10-CM | POA: Diagnosis not present

## 2020-08-31 DIAGNOSIS — E119 Type 2 diabetes mellitus without complications: Secondary | ICD-10-CM | POA: Diagnosis not present

## 2020-09-01 DIAGNOSIS — I2510 Atherosclerotic heart disease of native coronary artery without angina pectoris: Secondary | ICD-10-CM | POA: Diagnosis not present

## 2020-09-01 DIAGNOSIS — D649 Anemia, unspecified: Secondary | ICD-10-CM | POA: Diagnosis not present

## 2020-09-01 DIAGNOSIS — F329 Major depressive disorder, single episode, unspecified: Secondary | ICD-10-CM | POA: Diagnosis not present

## 2020-09-01 DIAGNOSIS — E1169 Type 2 diabetes mellitus with other specified complication: Secondary | ICD-10-CM | POA: Diagnosis not present

## 2020-09-01 DIAGNOSIS — M1990 Unspecified osteoarthritis, unspecified site: Secondary | ICD-10-CM | POA: Diagnosis not present

## 2020-09-01 DIAGNOSIS — I10 Essential (primary) hypertension: Secondary | ICD-10-CM | POA: Diagnosis not present

## 2020-09-01 DIAGNOSIS — K219 Gastro-esophageal reflux disease without esophagitis: Secondary | ICD-10-CM | POA: Diagnosis not present

## 2020-09-01 DIAGNOSIS — G4700 Insomnia, unspecified: Secondary | ICD-10-CM | POA: Diagnosis not present

## 2020-09-01 DIAGNOSIS — E1149 Type 2 diabetes mellitus with other diabetic neurological complication: Secondary | ICD-10-CM | POA: Diagnosis not present

## 2020-09-13 DIAGNOSIS — H903 Sensorineural hearing loss, bilateral: Secondary | ICD-10-CM | POA: Diagnosis not present

## 2020-09-13 DIAGNOSIS — H6123 Impacted cerumen, bilateral: Secondary | ICD-10-CM | POA: Diagnosis not present

## 2020-09-13 DIAGNOSIS — H9313 Tinnitus, bilateral: Secondary | ICD-10-CM | POA: Diagnosis not present

## 2020-09-15 DIAGNOSIS — G4700 Insomnia, unspecified: Secondary | ICD-10-CM | POA: Diagnosis not present

## 2020-09-15 DIAGNOSIS — F341 Dysthymic disorder: Secondary | ICD-10-CM | POA: Diagnosis not present

## 2020-09-15 DIAGNOSIS — F900 Attention-deficit hyperactivity disorder, predominantly inattentive type: Secondary | ICD-10-CM | POA: Diagnosis not present

## 2020-09-15 DIAGNOSIS — F411 Generalized anxiety disorder: Secondary | ICD-10-CM | POA: Diagnosis not present

## 2020-09-15 DIAGNOSIS — F3341 Major depressive disorder, recurrent, in partial remission: Secondary | ICD-10-CM | POA: Diagnosis not present

## 2020-09-15 DIAGNOSIS — F422 Mixed obsessional thoughts and acts: Secondary | ICD-10-CM | POA: Diagnosis not present

## 2020-09-21 DIAGNOSIS — E1169 Type 2 diabetes mellitus with other specified complication: Secondary | ICD-10-CM | POA: Diagnosis not present

## 2020-09-21 DIAGNOSIS — Z6835 Body mass index (BMI) 35.0-35.9, adult: Secondary | ICD-10-CM | POA: Diagnosis not present

## 2020-09-21 DIAGNOSIS — K219 Gastro-esophageal reflux disease without esophagitis: Secondary | ICD-10-CM | POA: Diagnosis not present

## 2020-09-21 DIAGNOSIS — Z7984 Long term (current) use of oral hypoglycemic drugs: Secondary | ICD-10-CM | POA: Diagnosis not present

## 2020-09-21 DIAGNOSIS — I10 Essential (primary) hypertension: Secondary | ICD-10-CM | POA: Diagnosis not present

## 2020-09-22 DIAGNOSIS — F341 Dysthymic disorder: Secondary | ICD-10-CM | POA: Diagnosis not present

## 2020-10-06 DIAGNOSIS — F341 Dysthymic disorder: Secondary | ICD-10-CM | POA: Diagnosis not present

## 2020-10-07 DIAGNOSIS — G4733 Obstructive sleep apnea (adult) (pediatric): Secondary | ICD-10-CM | POA: Diagnosis not present

## 2020-10-26 DIAGNOSIS — N200 Calculus of kidney: Secondary | ICD-10-CM | POA: Diagnosis not present

## 2020-10-27 DIAGNOSIS — F900 Attention-deficit hyperactivity disorder, predominantly inattentive type: Secondary | ICD-10-CM | POA: Diagnosis not present

## 2020-10-27 DIAGNOSIS — G4700 Insomnia, unspecified: Secondary | ICD-10-CM | POA: Diagnosis not present

## 2020-10-27 DIAGNOSIS — F422 Mixed obsessional thoughts and acts: Secondary | ICD-10-CM | POA: Diagnosis not present

## 2020-10-27 DIAGNOSIS — F341 Dysthymic disorder: Secondary | ICD-10-CM | POA: Diagnosis not present

## 2020-10-27 DIAGNOSIS — F411 Generalized anxiety disorder: Secondary | ICD-10-CM | POA: Diagnosis not present

## 2020-10-27 DIAGNOSIS — F3341 Major depressive disorder, recurrent, in partial remission: Secondary | ICD-10-CM | POA: Diagnosis not present

## 2020-11-03 DIAGNOSIS — F341 Dysthymic disorder: Secondary | ICD-10-CM | POA: Diagnosis not present

## 2020-11-04 DIAGNOSIS — G4733 Obstructive sleep apnea (adult) (pediatric): Secondary | ICD-10-CM | POA: Diagnosis not present

## 2020-11-09 ENCOUNTER — Telehealth: Payer: Self-pay | Admitting: *Deleted

## 2020-11-09 NOTE — Telephone Encounter (Signed)
Patient is calling with concerns about her right heel that is painful with walking. The callus in back of heel is cracking , pink and tender, not getting any better with the prescribed ointment.  Called and informed per Dr Prudence Davidson that she should try using chap stick and schedule an upcoming appointment.  Scheduled appointment thru Vmessage for 11/24/2020 @3 :15,call back to reschedule if unable to come.

## 2020-11-20 ENCOUNTER — Other Ambulatory Visit: Payer: Self-pay | Admitting: Cardiology

## 2020-11-22 DIAGNOSIS — D649 Anemia, unspecified: Secondary | ICD-10-CM | POA: Diagnosis not present

## 2020-11-22 DIAGNOSIS — F329 Major depressive disorder, single episode, unspecified: Secondary | ICD-10-CM | POA: Diagnosis not present

## 2020-11-22 DIAGNOSIS — M1990 Unspecified osteoarthritis, unspecified site: Secondary | ICD-10-CM | POA: Diagnosis not present

## 2020-11-22 DIAGNOSIS — K219 Gastro-esophageal reflux disease without esophagitis: Secondary | ICD-10-CM | POA: Diagnosis not present

## 2020-11-22 DIAGNOSIS — I10 Essential (primary) hypertension: Secondary | ICD-10-CM | POA: Diagnosis not present

## 2020-11-22 DIAGNOSIS — I2510 Atherosclerotic heart disease of native coronary artery without angina pectoris: Secondary | ICD-10-CM | POA: Diagnosis not present

## 2020-11-22 DIAGNOSIS — E1169 Type 2 diabetes mellitus with other specified complication: Secondary | ICD-10-CM | POA: Diagnosis not present

## 2020-11-22 DIAGNOSIS — G4700 Insomnia, unspecified: Secondary | ICD-10-CM | POA: Diagnosis not present

## 2020-11-22 DIAGNOSIS — E1149 Type 2 diabetes mellitus with other diabetic neurological complication: Secondary | ICD-10-CM | POA: Diagnosis not present

## 2020-11-23 ENCOUNTER — Telehealth: Payer: Self-pay | Admitting: *Deleted

## 2020-11-23 NOTE — Telephone Encounter (Signed)
[name] w/ [facility] is calling to request for patient a refill of ammonium lactate-12% cream. Last prescription filled 01/10/2019. Please advise.

## 2020-11-24 ENCOUNTER — Ambulatory Visit: Payer: Medicare PPO | Admitting: Podiatry

## 2020-11-24 ENCOUNTER — Other Ambulatory Visit: Payer: Self-pay

## 2020-11-24 ENCOUNTER — Encounter: Payer: Self-pay | Admitting: Podiatry

## 2020-11-24 DIAGNOSIS — R234 Changes in skin texture: Secondary | ICD-10-CM | POA: Diagnosis not present

## 2020-11-24 DIAGNOSIS — L84 Corns and callosities: Secondary | ICD-10-CM | POA: Diagnosis not present

## 2020-11-24 DIAGNOSIS — E1169 Type 2 diabetes mellitus with other specified complication: Secondary | ICD-10-CM

## 2020-11-24 DIAGNOSIS — J209 Acute bronchitis, unspecified: Secondary | ICD-10-CM | POA: Insufficient documentation

## 2020-11-24 DIAGNOSIS — D649 Anemia, unspecified: Secondary | ICD-10-CM | POA: Insufficient documentation

## 2020-11-24 DIAGNOSIS — R809 Proteinuria, unspecified: Secondary | ICD-10-CM | POA: Insufficient documentation

## 2020-11-24 DIAGNOSIS — R310 Gross hematuria: Secondary | ICD-10-CM | POA: Insufficient documentation

## 2020-11-24 DIAGNOSIS — N200 Calculus of kidney: Secondary | ICD-10-CM | POA: Insufficient documentation

## 2020-11-24 DIAGNOSIS — K219 Gastro-esophageal reflux disease without esophagitis: Secondary | ICD-10-CM | POA: Insufficient documentation

## 2020-11-24 DIAGNOSIS — E041 Nontoxic single thyroid nodule: Secondary | ICD-10-CM | POA: Insufficient documentation

## 2020-11-24 DIAGNOSIS — F411 Generalized anxiety disorder: Secondary | ICD-10-CM | POA: Insufficient documentation

## 2020-11-24 DIAGNOSIS — F329 Major depressive disorder, single episode, unspecified: Secondary | ICD-10-CM | POA: Insufficient documentation

## 2020-11-24 DIAGNOSIS — G4700 Insomnia, unspecified: Secondary | ICD-10-CM | POA: Insufficient documentation

## 2020-11-24 DIAGNOSIS — M79675 Pain in left toe(s): Secondary | ICD-10-CM | POA: Diagnosis not present

## 2020-11-24 DIAGNOSIS — B351 Tinea unguium: Secondary | ICD-10-CM | POA: Diagnosis not present

## 2020-11-24 DIAGNOSIS — M12812 Other specific arthropathies, not elsewhere classified, left shoulder: Secondary | ICD-10-CM | POA: Insufficient documentation

## 2020-11-24 DIAGNOSIS — H9319 Tinnitus, unspecified ear: Secondary | ICD-10-CM | POA: Insufficient documentation

## 2020-11-24 DIAGNOSIS — I83819 Varicose veins of unspecified lower extremities with pain: Secondary | ICD-10-CM | POA: Insufficient documentation

## 2020-11-24 DIAGNOSIS — R209 Unspecified disturbances of skin sensation: Secondary | ICD-10-CM | POA: Insufficient documentation

## 2020-11-24 DIAGNOSIS — M1990 Unspecified osteoarthritis, unspecified site: Secondary | ICD-10-CM | POA: Insufficient documentation

## 2020-11-24 DIAGNOSIS — R748 Abnormal levels of other serum enzymes: Secondary | ICD-10-CM | POA: Insufficient documentation

## 2020-11-25 DIAGNOSIS — L299 Pruritus, unspecified: Secondary | ICD-10-CM | POA: Diagnosis not present

## 2020-11-25 NOTE — Progress Notes (Signed)
.  This patient returns to my office for at risk foot care.  This patient requires this care by a professional since this patient will be at risk due to having diabetes.  Patient returns to pick up medication for her dry heels.  She says she had a fissure which has now resolved.  She also is concerned with her left big toe and says nail has regrown. She also wanted to discuss her achilles tendon right foot.   This patient presents for at risk foot care today.  General Appearance  Alert, conversant and in no acute stress.  Vascular  Dorsalis pedis and posterior tibial  pulses are palpable  bilaterally.  Capillary return is within normal limits  bilaterally. Temperature is within normal limits  bilaterally.  Neurologic  Senn-Weinstein monofilament wire test within normal limits  bilaterally. Muscle power within normal limits bilaterally.  Nails Thick disfigured discolored nails with subungual debris  from hallux to fifth toes bilaterally. No evidence of bacterial infection or drainage bilaterally.  Orthopedic  No limitations of motion  feet .  No crepitus or effusions noted.  No bony pathology or digital deformities noted. Thickness noted retrocalcaneally left foot. from previous surgery.    Skin   no porokeratosis noted bilaterally.  No signs of infections or ulcers noted.   Dry scaly skin on posterior aspect heels.  Fissures have resolved.    Callus/dryskin heels  B/L.  Nail spicules have redeveloped left hallux.  Consent was obtained for treatment procedures.   Discussed her dry heels and prescribe amlactin.  Discussed her achilles which has no pain or swelling.    Filed with dremel without incident. Discussed nail spicules and told her to use pumice stone as needed.   Return office visit  6 months                   Told patient to return for periodic foot care and evaluation due to potential at risk complications.   Gardiner Barefoot DPM

## 2020-12-01 DIAGNOSIS — F341 Dysthymic disorder: Secondary | ICD-10-CM | POA: Diagnosis not present

## 2020-12-01 NOTE — Telephone Encounter (Signed)
Called patient for update on prescription(Ammonium lactate). Patient was able to purchase medication OTC suggested by physician.Called and cancel prescription from [facility], patient is aware.

## 2020-12-20 ENCOUNTER — Other Ambulatory Visit: Payer: Self-pay | Admitting: Family Medicine

## 2020-12-20 DIAGNOSIS — Z1231 Encounter for screening mammogram for malignant neoplasm of breast: Secondary | ICD-10-CM

## 2021-01-10 DIAGNOSIS — F341 Dysthymic disorder: Secondary | ICD-10-CM | POA: Diagnosis not present

## 2021-01-17 DIAGNOSIS — K219 Gastro-esophageal reflux disease without esophagitis: Secondary | ICD-10-CM | POA: Diagnosis not present

## 2021-01-17 DIAGNOSIS — F329 Major depressive disorder, single episode, unspecified: Secondary | ICD-10-CM | POA: Diagnosis not present

## 2021-01-17 DIAGNOSIS — G4700 Insomnia, unspecified: Secondary | ICD-10-CM | POA: Diagnosis not present

## 2021-01-17 DIAGNOSIS — M1990 Unspecified osteoarthritis, unspecified site: Secondary | ICD-10-CM | POA: Diagnosis not present

## 2021-01-17 DIAGNOSIS — E1149 Type 2 diabetes mellitus with other diabetic neurological complication: Secondary | ICD-10-CM | POA: Diagnosis not present

## 2021-01-17 DIAGNOSIS — I2510 Atherosclerotic heart disease of native coronary artery without angina pectoris: Secondary | ICD-10-CM | POA: Diagnosis not present

## 2021-01-17 DIAGNOSIS — D649 Anemia, unspecified: Secondary | ICD-10-CM | POA: Diagnosis not present

## 2021-01-17 DIAGNOSIS — I10 Essential (primary) hypertension: Secondary | ICD-10-CM | POA: Diagnosis not present

## 2021-01-17 DIAGNOSIS — E1169 Type 2 diabetes mellitus with other specified complication: Secondary | ICD-10-CM | POA: Diagnosis not present

## 2021-01-27 DIAGNOSIS — F422 Mixed obsessional thoughts and acts: Secondary | ICD-10-CM | POA: Diagnosis not present

## 2021-01-27 DIAGNOSIS — F411 Generalized anxiety disorder: Secondary | ICD-10-CM | POA: Diagnosis not present

## 2021-01-27 DIAGNOSIS — F3341 Major depressive disorder, recurrent, in partial remission: Secondary | ICD-10-CM | POA: Diagnosis not present

## 2021-01-27 DIAGNOSIS — F341 Dysthymic disorder: Secondary | ICD-10-CM | POA: Diagnosis not present

## 2021-01-27 DIAGNOSIS — G4700 Insomnia, unspecified: Secondary | ICD-10-CM | POA: Diagnosis not present

## 2021-01-27 DIAGNOSIS — F900 Attention-deficit hyperactivity disorder, predominantly inattentive type: Secondary | ICD-10-CM | POA: Diagnosis not present

## 2021-02-07 DIAGNOSIS — F341 Dysthymic disorder: Secondary | ICD-10-CM | POA: Diagnosis not present

## 2021-02-07 DIAGNOSIS — R82991 Hypocitraturia: Secondary | ICD-10-CM | POA: Diagnosis not present

## 2021-02-07 DIAGNOSIS — G4733 Obstructive sleep apnea (adult) (pediatric): Secondary | ICD-10-CM | POA: Diagnosis not present

## 2021-02-07 DIAGNOSIS — R935 Abnormal findings on diagnostic imaging of other abdominal regions, including retroperitoneum: Secondary | ICD-10-CM | POA: Diagnosis not present

## 2021-02-07 DIAGNOSIS — N200 Calculus of kidney: Secondary | ICD-10-CM | POA: Diagnosis not present

## 2021-02-08 DIAGNOSIS — M25512 Pain in left shoulder: Secondary | ICD-10-CM | POA: Diagnosis not present

## 2021-02-08 DIAGNOSIS — M79671 Pain in right foot: Secondary | ICD-10-CM | POA: Diagnosis not present

## 2021-02-08 DIAGNOSIS — Z6835 Body mass index (BMI) 35.0-35.9, adult: Secondary | ICD-10-CM | POA: Diagnosis not present

## 2021-02-08 DIAGNOSIS — M5432 Sciatica, left side: Secondary | ICD-10-CM | POA: Diagnosis not present

## 2021-02-08 DIAGNOSIS — M5090 Cervical disc disorder, unspecified, unspecified cervical region: Secondary | ICD-10-CM | POA: Diagnosis not present

## 2021-02-09 DIAGNOSIS — N200 Calculus of kidney: Secondary | ICD-10-CM | POA: Diagnosis not present

## 2021-02-09 DIAGNOSIS — M160 Bilateral primary osteoarthritis of hip: Secondary | ICD-10-CM | POA: Diagnosis not present

## 2021-02-09 DIAGNOSIS — R933 Abnormal findings on diagnostic imaging of other parts of digestive tract: Secondary | ICD-10-CM | POA: Diagnosis not present

## 2021-02-09 DIAGNOSIS — K449 Diaphragmatic hernia without obstruction or gangrene: Secondary | ICD-10-CM | POA: Diagnosis not present

## 2021-02-09 DIAGNOSIS — D1771 Benign lipomatous neoplasm of kidney: Secondary | ICD-10-CM | POA: Diagnosis not present

## 2021-02-15 ENCOUNTER — Other Ambulatory Visit: Payer: Self-pay

## 2021-02-15 ENCOUNTER — Ambulatory Visit
Admission: RE | Admit: 2021-02-15 | Discharge: 2021-02-15 | Disposition: A | Discharge status: Home / Self Care | Payer: Medicare PPO | Source: Ambulatory Visit | Attending: Family Medicine | Admitting: Family Medicine

## 2021-02-15 DIAGNOSIS — Z1231 Encounter for screening mammogram for malignant neoplasm of breast: Secondary | ICD-10-CM | POA: Diagnosis not present

## 2021-02-16 DIAGNOSIS — M25512 Pain in left shoulder: Secondary | ICD-10-CM | POA: Diagnosis not present

## 2021-02-16 DIAGNOSIS — M7661 Achilles tendinitis, right leg: Secondary | ICD-10-CM | POA: Diagnosis not present

## 2021-02-17 ENCOUNTER — Other Ambulatory Visit: Payer: Self-pay | Admitting: Cardiology

## 2021-02-17 ENCOUNTER — Other Ambulatory Visit: Payer: Self-pay | Admitting: Family Medicine

## 2021-02-17 DIAGNOSIS — M1990 Unspecified osteoarthritis, unspecified site: Secondary | ICD-10-CM | POA: Diagnosis not present

## 2021-02-17 DIAGNOSIS — F329 Major depressive disorder, single episode, unspecified: Secondary | ICD-10-CM | POA: Diagnosis not present

## 2021-02-17 DIAGNOSIS — I10 Essential (primary) hypertension: Secondary | ICD-10-CM | POA: Diagnosis not present

## 2021-02-17 DIAGNOSIS — I2510 Atherosclerotic heart disease of native coronary artery without angina pectoris: Secondary | ICD-10-CM | POA: Diagnosis not present

## 2021-02-17 DIAGNOSIS — R928 Other abnormal and inconclusive findings on diagnostic imaging of breast: Secondary | ICD-10-CM

## 2021-02-17 DIAGNOSIS — E1149 Type 2 diabetes mellitus with other diabetic neurological complication: Secondary | ICD-10-CM | POA: Diagnosis not present

## 2021-02-17 DIAGNOSIS — K219 Gastro-esophageal reflux disease without esophagitis: Secondary | ICD-10-CM | POA: Diagnosis not present

## 2021-02-17 DIAGNOSIS — E1169 Type 2 diabetes mellitus with other specified complication: Secondary | ICD-10-CM | POA: Diagnosis not present

## 2021-02-17 DIAGNOSIS — G4700 Insomnia, unspecified: Secondary | ICD-10-CM | POA: Diagnosis not present

## 2021-02-17 DIAGNOSIS — D649 Anemia, unspecified: Secondary | ICD-10-CM | POA: Diagnosis not present

## 2021-02-18 ENCOUNTER — Encounter: Payer: Self-pay | Admitting: Cardiology

## 2021-02-18 ENCOUNTER — Ambulatory Visit: Payer: Medicare PPO | Admitting: Cardiology

## 2021-02-18 ENCOUNTER — Other Ambulatory Visit: Payer: Self-pay

## 2021-02-18 VITALS — BP 104/70 | HR 75 | Ht 66.0 in | Wt 219.4 lb

## 2021-02-18 DIAGNOSIS — I2510 Atherosclerotic heart disease of native coronary artery without angina pectoris: Secondary | ICD-10-CM | POA: Diagnosis not present

## 2021-02-18 DIAGNOSIS — M6281 Muscle weakness (generalized): Secondary | ICD-10-CM | POA: Diagnosis not present

## 2021-02-18 DIAGNOSIS — E669 Obesity, unspecified: Secondary | ICD-10-CM | POA: Diagnosis not present

## 2021-02-18 DIAGNOSIS — G4733 Obstructive sleep apnea (adult) (pediatric): Secondary | ICD-10-CM | POA: Diagnosis not present

## 2021-02-18 DIAGNOSIS — I10 Essential (primary) hypertension: Secondary | ICD-10-CM | POA: Diagnosis not present

## 2021-02-18 DIAGNOSIS — M7661 Achilles tendinitis, right leg: Secondary | ICD-10-CM | POA: Diagnosis not present

## 2021-02-18 DIAGNOSIS — E7800 Pure hypercholesterolemia, unspecified: Secondary | ICD-10-CM

## 2021-02-18 DIAGNOSIS — R55 Syncope and collapse: Secondary | ICD-10-CM

## 2021-02-18 DIAGNOSIS — R293 Abnormal posture: Secondary | ICD-10-CM | POA: Diagnosis not present

## 2021-02-18 DIAGNOSIS — M25512 Pain in left shoulder: Secondary | ICD-10-CM | POA: Diagnosis not present

## 2021-02-18 MED ORDER — EZETIMIBE 10 MG PO TABS: 10.0000 mg | ORAL_TABLET | Freq: Every day | ORAL | 3 refills | Status: DC

## 2021-02-18 MED ORDER — LISINOPRIL 10 MG PO TABS: 10.0000 mg | ORAL_TABLET | Freq: Every morning | ORAL | 3 refills | Status: AC

## 2021-02-18 MED ORDER — ATORVASTATIN CALCIUM 40 MG PO TABS: 40.0000 mg | ORAL_TABLET | Freq: Every day | ORAL | 3 refills | Status: DC

## 2021-02-18 NOTE — Addendum Note (Signed)
Addended by: Antonieta Iba on: 02/18/2021 04:01 PM   Modules accepted: Orders

## 2021-02-18 NOTE — Progress Notes (Signed)
Date:  02/18/2021   ID:  [name], DOB Aug 10, 1950, MRN [id number]   PCP:  Kathyrn Lass, MD  Cardiologist:  Fransico Him, MD  Electrophysiologist:  None   Chief Complaint:  OSA/CAD/HTN/Obesity  History of Present Illness:    [name] is a 70 y.o. female  with a hx of nonobstructive ASCAD with 20% OM, HTN, dyslipidemia, vasovagal syncope and OSA on CPAP.    She is here today for followup and is doing well.  She has chronic DOE that is unchanged and related to underlying obesity and deconditioning.  She denies any chest pain or pressure, PND, orthopnea, LE edema, dizziness (except when standing up too fast), palpitations or syncope. She is compliant with her meds and is tolerating meds with no SE.     She is doing well with her CPAP device and thinks that she has gotten used to it.  She tolerates the mask and feels the pressure is adequate.  Since going on CPAP she feels rested in the am and has no significant daytime sleepiness.  She denies any significant mouth or nasal dryness or nasal congestion.  She does not think that he snores.    Prior CV studies:   The following studies were reviewed today:  PAP compliance download  Past Medical History:  Diagnosis Date   Arthritis    Atherosclerosis    Back pain    Cancer (HCC)    hx of skin cancer    Chronic fatigue    and weakness   Colon polyps    Constipation    Coronary artery disease    nonobstructive with 20% OM by cath 2006   Depression    Diabetes mellitus without complication ([place])    type 2    Diastolic dysfunction    w Elevated LVEDP   DJD (degenerative joint disease)    C3/4 Dr Sherwood Gambler   Edema, lower extremity    GERD (gastroesophageal reflux disease)    High cholesterol    History of kidney stones    Hypertension    Hypoglycemia    Kidney stones    Multiple thyroid nodules    Neurocardiogenic syncope    Obesity    Osteoarthritis    PONV (postoperative nausea and vomiting)     Prediabetes 2011 & 2012   Right knee pain 12/2009   Dr Marlou Sa   Sleep apnea    CPAP had Sleep Study ordered by Dr. Radford Pax    Tick bite 11/2016   Vasovagal syncope    Past Surgical History:  Procedure Laterality Date   ACHILLES TENDON SURGERY Left 01/23/2013   Procedure: EXCISION PARTIAL BONE TALUS/CALCANEUS, REPAIR RUPTURE ACHILLES TENDON PRIMARY OPEN ;  Surgeon: Ninetta Lights, MD;  Location: [facility];  Service: Orthopedics;  Laterality: Left;   ANTERIOR FUSION CERVICAL SPINE  2000, 2002   x2-cervical   BREAST BIOPSY Right 2007   BREAST CYST EXCISION Left 2007   CARDIAC CATHETERIZATION  ~ 2008   Nonobstructive ASCAD, 20% OM   CARPAL TUNNEL RELEASE Left 2002   CYSTOSCOPY W/ URETERAL STENT PLACEMENT Left 12/23/2019   Procedure: CYSTOSCOPY WITH RETROGRADE PYELOGRAM/URETERAL STENT PLACEMENT;  Surgeon: Ardis Hughs, MD;  Location: [facility];  Service: Urology;  Laterality: Left;   CYSTOSCOPY/URETEROSCOPY/HOLMIUM LASER/STENT PLACEMENT Left 01/05/2020   Procedure: CYSTOSCOPY LEFT URETEROSCOPY/HOLMIUM LASER/STENT EXCHANGE;  Surgeon: [name], MD;  Location: [facility];  Service: Urology;  Laterality: Left;   EXTRACORPOREAL SHOCK WAVE LITHOTRIPSY Left 12/22/2019  Procedure: LEFT EXTRACORPOREAL SHOCK WAVE LITHOTRIPSY (ESWL);  Surgeon: Ardis Hughs, MD;  Location[facility];  Service: Urology;  Laterality: Left;   LITHOTRIPSY     OTHER SURGICAL HISTORY  06/12/2018   Left big toe nail surgery. [facility]    TONSILLECTOMY     TOTAL KNEE ARTHROPLASTY Right 11/2010   TOTAL KNEE ARTHROPLASTY  12/13/2011   Procedure: TOTAL KNEE ARTHROPLASTY;  Surgeon: Ninetta Lights, MD;  Location: [facility];  Service: Orthopedics;  Laterality: Left;  DR MURPHY WANTS 90 MINUTES FOR THIS CASE     Current Meds  Medication Sig   ammonium lactate (LAC-HYDRIN) 12 % cream Apply topically as needed for dry skin.   aspirin 81 MG tablet Take 81 mg by mouth daily.    atorvastatin (LIPITOR) 40 MG tablet TAKE ONE TABLET BY MOUTH ONCE DAILY   calcium carbonate (OSCAL) 1500 (600 Ca) MG TABS tablet Take 600 mg of elemental calcium by mouth daily with breakfast.   Cholecalciferol (VITAMIN D3 PO) Take 2,000 Units by mouth daily.   clonazePAM (KLONOPIN) 0.5 MG tablet Take 0.5 mg by mouth daily.    desvenlafaxine (PRISTIQ) 50 MG 24 hr tablet Take 25 mg by mouth daily.    ergocalciferol (VITAMIN D2) 1.25 MG (50000 UT) capsule Take by mouth.   ezetimibe (ZETIA) 10 MG tablet TAKE ONE TABLET BY MOUTH ONCE DAILY   FERROUS SULFATE PO Take 65 mg by mouth daily.   ibuprofen (ADVIL,MOTRIN) 200 MG tablet Take 400 mg by mouth as needed (pain).    lisinopril (ZESTRIL) 10 MG tablet TAKE ONE TABLET BY MOUTH EVERY MORNING   metFORMIN (GLUCOPHAGE) 500 MG tablet Take 500 mg by mouth 2 (two) times daily with a meal.    Multiple Vitamins-Minerals (PRESERVISION AREDS 2 PO) Take 1 capsule by mouth daily.   NONFORMULARY OR COMPOUNDED ITEM Apply topically 2 (two) times daily. Salicylic Acid [id number], Urea [id number] Cream, apply to the affected area once or twice daily   traZODone (DESYREL) 50 MG tablet Take 150 mg by mouth at bedtime.   [DISCONTINUED] CALCIUM PO Take 1,600 mg by mouth daily.      Allergies:   Guaifenesin & derivatives, Morphine and related, Penicillins, Tramadol, Hydrocodone-acetaminophen, and Oxycodone hcl   Social History   Tobacco Use   Smoking status: Never   Smokeless tobacco: Never  Vaping Use   Vaping Use: Never used  Substance Use Topics   Alcohol use: Yes    Alcohol/week: 6.0 standard drinks    Types: 6 Glasses of wine per week    Comment: couple times per week.   Drug use: No     Family Hx: The patient's family history includes Breast cancer in her sister; CAD in her father; CVA in her mother; Cancer in her paternal aunt; Cancer - Prostate in her father; Colon cancer in an other family member; Crohn's disease in her paternal aunt; Depression in her mother;  Diverticulosis in her father, mother, and sister; Heart disease in her father and paternal grandfather; Hypertension in her father, mother, and sister; Kidney disease in her mother; Sudden death in her father.  ROS:   Please see the history of present illness.     All other systems reviewed and are negative.   Labs/Other Tests and Data Reviewed:    Recent Labs: No results found for requested labs within last 8760 hours.   Recent Lipid Panel Lab Results  Component Value Date/Time   CHOL 165 02/03/2020 03:46  PM   TRIG 176 (H) 02/03/2020 03:46 PM   HDL 45 02/03/2020 03:46 PM   CHOLHDL 3.7 02/03/2020 03:46 PM   CHOLHDL 3.9 08/05/2015 11:49 AM   LDLCALC 90 02/03/2020 03:46 PM   LDLDIRECT 93.3 09/17/2013 01:07 PM    Wt Readings from Last 3 Encounters:  02/18/21 219 lb 6.4 oz (99.5 kg)  03/18/20 216 lb (98 kg)  03/03/20 215 lb (97.5 kg)     Objective:    Vital Signs:  BP 104/70   Pulse 75   Ht '5\' 6"'$  (1.676 m)   Wt 219 lb 6.4 oz (99.5 kg)   SpO2 96%   BMI 35.41 kg/m    GEN: Well nourished, well developed in no acute distress HEENT: Normal NECK: No JVD; No carotid bruits LYMPHATICS: No lymphadenopathy CARDIAC:RRR, no murmurs, rubs, gallops RESPIRATORY:  Clear to auscultation without rales, wheezing or rhonchi  ABDOMEN: Soft, non-tender, non-distended MUSCULOSKELETAL:  No edema; No deformity  SKIN: Warm and dry NEUROLOGIC:  Alert and oriented x 3 PSYCHIATRIC:  Normal affect   ASSESSMENT & PLAN:    1.  OSA - The patient is tolerating PAP therapy well without any problems. The PAP download performed by his DME was personally reviewed and interpreted by me today and showed an AHI of 0.7/hr on 11 cm H2O with 83% compliance in using more than 4 hours nightly.  The patient has been using and benefiting from PAP use and will continue to benefit from therapy.     2.  Hypertension  -BP controlled on exam today -Continue prescription drug management with Lisinopril '10mg'$   daily -I have personally reviewed and interpreted outside labs performed by patient's PCP which showed SCr 0.92, K+ 4.6, ALT 19 in Feb 2022    3.  Obesity  - I have encouraged her to get into a routine exercise program and cut back on carbs and portions.   - I referred her to the [facility] weight loss and healthy lifestyle program but she only did it for 2 months was frustrated  4.  Nonobstructive ASCAD  -cath in 2006 showed 20% OM.   -She has had some DOE when walking up an incline but once it levels out her SOB resolves.  This is a chronic problem.  Leane Call 12/2018 showed no ischemia -continue ASA and statin   5.   Hyperlipidemia  - her LDL goal is < 70.  - I have personally reviewed and interpreted outside labs performed by patient's PCP which showed LDL 75, HDL 45, ALT 19  - Continue prescription drug management with Zetia '10mg'$  daily and Atorvastatin '40mg'$  daily   6.  Syncope -ccurred in setting of severe back pain after a lithotripsy and then went to go to the bathroom and got nauseated and then passed out.  She got up and went to the bed and then passed out again. She was admitted and given IVF and found to have a kidney hematoma.  She then had ureteral stent.   -She has not had any further dizziness or syncope.  -sx c/w vasovagal syncope -2D echo 01/2020 showed normal LVF with moderate to severe septal hypertrophy with no obstruction and mild MR -cMRI showed normal LVF with EF 58%, mild BSH, mild LAE and RAE, mild MR/TR and no HOCM    Medication Adjustments/Labs and Tests Ordered: Current medicines are reviewed at length with the patient today.  Concerns regarding medicines are outlined above.  Tests Ordered: Orders Placed This Encounter  Procedures  EKG 12-Lead    Medication Changes: No orders of the defined types were placed in this encounter.   Disposition:  Follow up in 1 year(s)  Signed, Fransico Him, MD  02/18/2021 3:54 PM    [place]

## 2021-02-18 NOTE — Patient Instructions (Signed)

## 2021-02-22 ENCOUNTER — Encounter: Payer: Self-pay | Admitting: Gastroenterology

## 2021-02-22 ENCOUNTER — Telehealth: Payer: Self-pay

## 2021-02-22 DIAGNOSIS — R293 Abnormal posture: Secondary | ICD-10-CM | POA: Diagnosis not present

## 2021-02-22 DIAGNOSIS — M7661 Achilles tendinitis, right leg: Secondary | ICD-10-CM | POA: Diagnosis not present

## 2021-02-22 DIAGNOSIS — M25512 Pain in left shoulder: Secondary | ICD-10-CM | POA: Diagnosis not present

## 2021-02-22 DIAGNOSIS — R9389 Abnormal findings on diagnostic imaging of other specified body structures: Secondary | ICD-10-CM

## 2021-02-22 DIAGNOSIS — M6281 Muscle weakness (generalized): Secondary | ICD-10-CM | POA: Diagnosis not present

## 2021-02-22 NOTE — Telephone Encounter (Signed)
-----   Message from [name], MD sent at 02/21/2021 11:22 AM EDT ----- Thank you. I will get her in for a follow up, she will need an endoscopy.  Jan can you please contact this patient for me? She needs an upper endoscopy, I think okay to direct book this at the [facility], but if she wishes to see me in the office first that is fine, whichever her preference.  Thanks  ----- Message ----- From: [name], MD Sent: 02/21/2021   8:59 AM EDT To: [name], MD, [name], MD  Thanks.  I have referred her back to Dr. Havery Moros for further eval.  ----- Message ----- From: [name], MD Sent: 02/21/2021   8:43 AM EDT To: [name], MD  Hi John,  I apologize for the delayed response. I was out of town last week. There is high density along the posterior wall of the stomach, new or increased from prior. Finding is nonspecific, especially given poor distention and lack of IV contrast. An upper endoscopy would be the next step if clinically warranted. I dictated an addendum.  Hope you have a great week.  Rip Harbour  ----- Message ----- From: [name], MD Sent: 02/11/2021   1:45 PM EDT To: [name], MD, [name], MD  [name] read the CT stones study on this patient from earlier this week at [facility].   She had an unusual calcification on 2 KUB that overlies the left renal shadow but was not consistent with a stone and did change over 2 days which led me to get the CT.  She has a calcification in the stomach and apparently had some sort of stomach lining abnormality that was non-specific on a prior upper endo.  Can you comment on the stomach and the calcification?   It wasn't mentioned in the report.  It doesn't seem to be consistent with food since it is unchanging over a few days.     Thanks  [name]

## 2021-02-22 NOTE — Telephone Encounter (Signed)
Called and spoke to patient.  She agreed to have EGD tomorrow 7-27  Instructions sent to MyChart.  Amy Hazelwood and [employer] notified of late add on.

## 2021-02-22 NOTE — Telephone Encounter (Signed)
Called and LM for patient to call back to discuss scheduling for OV with Dr. Havery Moros OR direct book for EGD to examine her stomach - due to abnormal finding on CT scan. Per Dr. Jeffie Pollock KUB showed "calcification in the stomach and apparently had some sort of stomach lining abnormality that was non-specific on a prior upper endo". Dr. Rosario Jacks referred back to [name] for EGD to evaluate.

## 2021-02-23 ENCOUNTER — Encounter: Payer: Medicare PPO | Admitting: Gastroenterology

## 2021-02-28 ENCOUNTER — Ambulatory Visit (AMBULATORY_SURGERY_CENTER): Payer: Medicare PPO

## 2021-02-28 VITALS — Ht 66.0 in | Wt 222.0 lb

## 2021-02-28 DIAGNOSIS — R9389 Abnormal findings on diagnostic imaging of other specified body structures: Secondary | ICD-10-CM

## 2021-02-28 NOTE — Progress Notes (Signed)

## 2021-03-01 DIAGNOSIS — M6281 Muscle weakness (generalized): Secondary | ICD-10-CM | POA: Diagnosis not present

## 2021-03-01 DIAGNOSIS — R293 Abnormal posture: Secondary | ICD-10-CM | POA: Diagnosis not present

## 2021-03-01 DIAGNOSIS — M7661 Achilles tendinitis, right leg: Secondary | ICD-10-CM | POA: Diagnosis not present

## 2021-03-01 DIAGNOSIS — M25512 Pain in left shoulder: Secondary | ICD-10-CM | POA: Diagnosis not present

## 2021-03-03 DIAGNOSIS — M6281 Muscle weakness (generalized): Secondary | ICD-10-CM | POA: Diagnosis not present

## 2021-03-03 DIAGNOSIS — R293 Abnormal posture: Secondary | ICD-10-CM | POA: Diagnosis not present

## 2021-03-03 DIAGNOSIS — M7661 Achilles tendinitis, right leg: Secondary | ICD-10-CM | POA: Diagnosis not present

## 2021-03-03 DIAGNOSIS — M25512 Pain in left shoulder: Secondary | ICD-10-CM | POA: Diagnosis not present

## 2021-03-06 DIAGNOSIS — F341 Dysthymic disorder: Secondary | ICD-10-CM | POA: Diagnosis not present

## 2021-03-07 ENCOUNTER — Ambulatory Visit: Payer: Medicare PPO

## 2021-03-07 ENCOUNTER — Other Ambulatory Visit: Payer: Self-pay

## 2021-03-07 ENCOUNTER — Ambulatory Visit
Admission: RE | Admit: 2021-03-07 | Discharge: 2021-03-07 | Disposition: A | Discharge status: Home / Self Care | Payer: Medicare PPO | Source: Ambulatory Visit | Attending: Family Medicine | Admitting: Family Medicine

## 2021-03-07 DIAGNOSIS — R928 Other abnormal and inconclusive findings on diagnostic imaging of breast: Secondary | ICD-10-CM

## 2021-03-07 DIAGNOSIS — F341 Dysthymic disorder: Secondary | ICD-10-CM | POA: Diagnosis not present

## 2021-03-07 DIAGNOSIS — R922 Inconclusive mammogram: Secondary | ICD-10-CM | POA: Diagnosis not present

## 2021-03-07 DIAGNOSIS — R921 Mammographic calcification found on diagnostic imaging of breast: Secondary | ICD-10-CM | POA: Diagnosis not present

## 2021-03-08 DIAGNOSIS — M6281 Muscle weakness (generalized): Secondary | ICD-10-CM | POA: Diagnosis not present

## 2021-03-08 DIAGNOSIS — R293 Abnormal posture: Secondary | ICD-10-CM | POA: Diagnosis not present

## 2021-03-08 DIAGNOSIS — M25512 Pain in left shoulder: Secondary | ICD-10-CM | POA: Diagnosis not present

## 2021-03-08 DIAGNOSIS — M7661 Achilles tendinitis, right leg: Secondary | ICD-10-CM | POA: Diagnosis not present

## 2021-03-22 DIAGNOSIS — M7661 Achilles tendinitis, right leg: Secondary | ICD-10-CM | POA: Diagnosis not present

## 2021-03-24 DIAGNOSIS — F341 Dysthymic disorder: Secondary | ICD-10-CM | POA: Diagnosis not present

## 2021-03-24 DIAGNOSIS — F411 Generalized anxiety disorder: Secondary | ICD-10-CM | POA: Diagnosis not present

## 2021-03-24 DIAGNOSIS — F422 Mixed obsessional thoughts and acts: Secondary | ICD-10-CM | POA: Diagnosis not present

## 2021-03-24 DIAGNOSIS — F3341 Major depressive disorder, recurrent, in partial remission: Secondary | ICD-10-CM | POA: Diagnosis not present

## 2021-03-25 ENCOUNTER — Ambulatory Visit (AMBULATORY_SURGERY_CENTER): Payer: Medicare PPO | Admitting: Gastroenterology

## 2021-03-25 ENCOUNTER — Encounter: Payer: Self-pay | Admitting: Gastroenterology

## 2021-03-25 ENCOUNTER — Other Ambulatory Visit: Payer: Self-pay

## 2021-03-25 VITALS — BP 122/69 | HR 65 | Temp 97.7°F | Resp 12 | Ht 66.0 in | Wt 222.0 lb

## 2021-03-25 DIAGNOSIS — G4733 Obstructive sleep apnea (adult) (pediatric): Secondary | ICD-10-CM | POA: Diagnosis not present

## 2021-03-25 DIAGNOSIS — K209 Esophagitis, unspecified without bleeding: Secondary | ICD-10-CM

## 2021-03-25 DIAGNOSIS — K2950 Unspecified chronic gastritis without bleeding: Secondary | ICD-10-CM | POA: Diagnosis not present

## 2021-03-25 DIAGNOSIS — R933 Abnormal findings on diagnostic imaging of other parts of digestive tract: Secondary | ICD-10-CM | POA: Diagnosis not present

## 2021-03-25 DIAGNOSIS — K317 Polyp of stomach and duodenum: Secondary | ICD-10-CM | POA: Diagnosis not present

## 2021-03-25 DIAGNOSIS — K449 Diaphragmatic hernia without obstruction or gangrene: Secondary | ICD-10-CM | POA: Diagnosis not present

## 2021-03-25 DIAGNOSIS — K2970 Gastritis, unspecified, without bleeding: Secondary | ICD-10-CM

## 2021-03-25 DIAGNOSIS — R1310 Dysphagia, unspecified: Secondary | ICD-10-CM

## 2021-03-25 DIAGNOSIS — E119 Type 2 diabetes mellitus without complications: Secondary | ICD-10-CM | POA: Diagnosis not present

## 2021-03-25 DIAGNOSIS — I2510 Atherosclerotic heart disease of native coronary artery without angina pectoris: Secondary | ICD-10-CM | POA: Diagnosis not present

## 2021-03-25 DIAGNOSIS — R9389 Abnormal findings on diagnostic imaging of other specified body structures: Secondary | ICD-10-CM

## 2021-03-25 MED ORDER — SODIUM CHLORIDE 0.9 % IV SOLN: 500.0000 mL | Freq: Once | INTRAVENOUS | Status: DC

## 2021-03-25 MED ORDER — ESOMEPRAZOLE MAGNESIUM 20 MG PO CPDR: 20.0000 mg | DELAYED_RELEASE_CAPSULE | Freq: Every day | ORAL | 3 refills | Status: DC

## 2021-03-25 NOTE — Progress Notes (Signed)
Called to room to assist during endoscopic procedure.  Patient ID and intended procedure confirmed with present staff. Received instructions for my participation in the procedure from the performing physician.  

## 2021-03-25 NOTE — Progress Notes (Signed)
Report given to PACU, vss 

## 2021-03-25 NOTE — Progress Notes (Signed)
[facility] History and Physical   Primary Care Physician:  Kathyrn Lass, MD   Reason for Procedure:   Abnormal CT Scan stomach, dysphagia  Plan:    EGD with possible dilation     HPI: [name] is a 70 y.o. female who has had an abnormal CT Scan showing some calfifications in her stomach, increased from when previously noted. She had some white plaques in her stomach on prior EGD In 2018. She also has intermittent dysphagia. Has had empiric dilation in the past and it helped significantly, she is hoping to have another dilation today. Otherwise feels well without complaints.   Past Medical History:  Diagnosis Date   Arthritis    Atherosclerosis    Back pain    Cancer ([place]) 2000   hx of skin cancer , basal cell   Chronic fatigue    and weakness   Colon polyps    Constipation    Coronary artery disease    nonobstructive with 20% OM by cath 2006   Depression    Diabetes mellitus without complication ([place])    type 2    Diastolic dysfunction    w Elevated LVEDP   DJD (degenerative joint disease)    C3/4 Dr Sherwood Gambler   Edema, lower extremity    GERD (gastroesophageal reflux disease)    High cholesterol    History of kidney stones    Hypertension    Hypoglycemia    Kidney stones    Multiple thyroid nodules    Neurocardiogenic syncope    Obesity    Osteoarthritis    PONV (postoperative nausea and vomiting)    Prediabetes 2011 & 2012   Right knee pain 12/2009   Dr Marlou Sa   Sleep apnea    CPAP had Sleep Study ordered by Dr. Radford Pax    Tick bite 11/2016   Vasovagal syncope     Past Surgical History:  Procedure Laterality Date   ACHILLES TENDON SURGERY Left 01/23/2013   Procedure: EXCISION PARTIAL BONE TALUS/CALCANEUS, REPAIR RUPTURE ACHILLES TENDON PRIMARY OPEN ;  Surgeon: Ninetta Lights, MD;  Location: [place];  Service: Orthopedics;  Laterality: Left;   ANTERIOR FUSION CERVICAL SPINE  2000, 2002   x2-cervical   BREAST BIOPSY  Right 2007   BREAST CYST EXCISION Left 2007   CARDIAC CATHETERIZATION  ~ 2008   Nonobstructive ASCAD, 20% OM   CARPAL TUNNEL RELEASE Left 2002   COLONOSCOPY     CYSTOSCOPY W/ URETERAL STENT PLACEMENT Left 12/23/2019   Procedure: CYSTOSCOPY WITH RETROGRADE PYELOGRAM/URETERAL STENT PLACEMENT;  Surgeon: Ardis Hughs, MD;  Location: [facility];  Service: Urology;  Laterality: Left;   CYSTOSCOPY/URETEROSCOPY/HOLMIUM LASER/STENT PLACEMENT Left 01/05/2020   Procedure: CYSTOSCOPY LEFT URETEROSCOPY/HOLMIUM LASER/STENT EXCHANGE;  Surgeon: [name], MD;  Location: [facility];  Service: Urology;  Laterality: Left;   EXTRACORPOREAL SHOCK WAVE LITHOTRIPSY Left 12/22/2019   Procedure: LEFT EXTRACORPOREAL SHOCK WAVE LITHOTRIPSY (ESWL);  Surgeon: Ardis Hughs, MD;  Location: [facility];  Service: Urology;  Laterality: Left;   LITHOTRIPSY     OTHER SURGICAL HISTORY  06/12/2018   Left big toe nail surgery. [facility]    TONSILLECTOMY     TOTAL KNEE ARTHROPLASTY Right 11/2010   TOTAL KNEE ARTHROPLASTY  12/13/2011   Procedure: TOTAL KNEE ARTHROPLASTY;  Surgeon: Ninetta Lights, MD;  Location: [facility];  Service: Orthopedics;  Laterality: Left;  DR MURPHY WANTS 90 MINUTES FOR THIS CASE   UPPER GASTROINTESTINAL ENDOSCOPY  Prior to Admission medications   Medication Sig Start Date End Date Taking? Authorizing Provider  aspirin 81 MG tablet Take 81 mg by mouth daily.   Yes [provider]  atorvastatin (LIPITOR) 40 MG tablet Take 1 tablet (40 mg total) by mouth daily. 02/18/21  Yes [name], MD  calcium carbonate (OSCAL) 1500 (600 Ca) MG TABS tablet Take 600 mg of elemental calcium by mouth daily with breakfast. 07/22/19  Yes [provider]  Cholecalciferol (VITAMIN D3 PO) Take 2,000 Units by mouth daily.   Yes [provider]  clonazePAM (KLONOPIN) 0.5 MG tablet Take 0.5 mg by mouth daily.  06/04/18  Yes [provider]   desvenlafaxine (PRISTIQ) 50 MG 24 hr tablet Take 25 mg by mouth daily.    Yes [provider]  ergocalciferol (VITAMIN D2) 1.25 MG (50000 UT) capsule Take by mouth.   Yes [provider]  ezetimibe (ZETIA) 10 MG tablet Take 1 tablet (10 mg total) by mouth daily. 02/18/21  Yes Turner, Eber Hong, MD  ibuprofen (ADVIL,MOTRIN) 200 MG tablet Take 400 mg by mouth as needed (pain).    Yes [provider]  lisinopril (ZESTRIL) 10 MG tablet Take 1 tablet (10 mg total) by mouth every morning. 02/18/21  Yes Turner, Traci R, MD  metFORMIN (GLUCOPHAGE) 500 MG tablet Take 500 mg by mouth 2 (two) times daily with a meal.    Yes [provider]  Multiple Vitamins-Minerals (PRESERVISION AREDS 2 PO) Take 1 capsule by mouth daily.   Yes [provider]  nitroGLYCERIN (NITRODUR - DOSED IN MG/24 HR) 0.1 mg/hr patch Place 0.1 mg onto the skin daily. Back of right heel to aid with circulation.   Yes [provider]  Omega-3 Fatty Acids (FISH OIL) 1200 MG CAPS Take 3 capsules by mouth daily.   Yes [provider]  traZODone (DESYREL) 50 MG tablet Take 150 mg by mouth at bedtime.   Yes [provider]  vitamin B-12 (CYANOCOBALAMIN) 1000 MCG tablet 1 tablet 12/24/19  Yes [provider]  ammonium lactate (LAC-HYDRIN) 12 % cream Apply topically as needed for dry skin. Patient not taking: Reported on 03/25/2021 01/10/19   Gardiner Barefoot, DPM  FERROUS SULFATE PO Take 65 mg by mouth daily. Patient not taking: Reported on 03/25/2021    [provider]  meloxicam (MOBIC) 7.5 MG tablet Take by mouth. Patient not taking: Reported on 03/25/2021    [provider]  NONFORMULARY OR COMPOUNDED ITEM Apply topically 2 (two) times daily. Salicylic Acid [id number], Urea [id number] Cream, apply to the affected area once or twice daily Patient not taking: Reported on 03/25/2021    Bronson Ing, DPM    Current Outpatient Medications  Medication Sig Dispense  Refill   aspirin 81 MG tablet Take 81 mg by mouth daily.     atorvastatin (LIPITOR) 40 MG tablet Take 1 tablet (40 mg total) by mouth daily. 90 tablet 3   calcium carbonate (OSCAL) 1500 (600 Ca) MG TABS tablet Take 600 mg of elemental calcium by mouth daily with breakfast.     Cholecalciferol (VITAMIN D3 PO) Take 2,000 Units by mouth daily.     clonazePAM (KLONOPIN) 0.5 MG tablet Take 0.5 mg by mouth daily.   2   desvenlafaxine (PRISTIQ) 50 MG 24 hr tablet Take 25 mg by mouth daily.      ergocalciferol (VITAMIN D2) 1.25 MG (50000 UT) capsule Take by mouth.     ezetimibe (ZETIA) 10 MG  tablet Take 1 tablet (10 mg total) by mouth daily. 90 tablet 3   ibuprofen (ADVIL,MOTRIN) 200 MG tablet Take 400 mg by mouth as needed (pain).      lisinopril (ZESTRIL) 10 MG tablet Take 1 tablet (10 mg total) by mouth every morning. 90 tablet 3   metFORMIN (GLUCOPHAGE) 500 MG tablet Take 500 mg by mouth 2 (two) times daily with a meal.      Multiple Vitamins-Minerals (PRESERVISION AREDS 2 PO) Take 1 capsule by mouth daily.     nitroGLYCERIN (NITRODUR - DOSED IN MG/24 HR) 0.1 mg/hr patch Place 0.1 mg onto the skin daily. Back of right heel to aid with circulation.     Omega-3 Fatty Acids (FISH OIL) 1200 MG CAPS Take 3 capsules by mouth daily.     traZODone (DESYREL) 50 MG tablet Take 150 mg by mouth at bedtime.     vitamin B-12 (CYANOCOBALAMIN) 1000 MCG tablet 1 tablet     ammonium lactate (LAC-HYDRIN) 12 % cream Apply topically as needed for dry skin. (Patient not taking: Reported on 03/25/2021) 385 g 3   FERROUS SULFATE PO Take 65 mg by mouth daily. (Patient not taking: Reported on 03/25/2021)     meloxicam (MOBIC) 7.5 MG tablet Take by mouth. (Patient not taking: Reported on 03/25/2021)     NONFORMULARY OR COMPOUNDED ITEM Apply topically 2 (two) times daily. Salicylic Acid [id number], Urea [id number] Cream, apply to the affected area once or twice daily (Patient not taking: Reported on 03/25/2021)     Current  Facility-Administered Medications  Medication Dose Route Frequency Provider Last Rate Last Admin   0.9 %  sodium chloride infusion  500 mL Intravenous Once Pedro Whiters, Carlota Raspberry, MD        Allergies as of 03/25/2021 - Review Complete 03/25/2021  Allergen Reaction Noted   Guaifenesin & derivatives Itching 11/29/2011   Morphine and related Itching 11/29/2011   Penicillins Itching 11/29/2011   Tramadol Itching 11/29/2011   Hydrocodone-acetaminophen Itching 09/01/2013   Oxycodone hcl Itching 09/01/2013    Family History  Problem Relation Age of Onset   CVA Mother    Diverticulosis Mother    Kidney disease Mother    Hypertension Mother    Depression Mother    CAD Father    Cancer - Prostate Father    Heart disease Father    Diverticulosis Father    Hypertension Father    Sudden death Father    Hypertension Sister    Diverticulosis Sister    Breast cancer Sister    Crohn's disease Paternal Aunt    Cancer Paternal Aunt        x 2, type unknown   Heart disease Paternal Grandfather    Colon cancer Other        maternal great Aunt   Esophageal cancer Neg Hx    Stomach cancer Neg Hx    Rectal cancer Neg Hx     Social History   Socioeconomic History   Marital status: Single    Spouse name: Not on file   Number of children: 0   Years of education: 20   Highest education level: Not on file  Occupational History   Occupation: retired/ Professor of Music  Tobacco Use   Smoking status: Never    Passive exposure: Past (Mother)   Smokeless tobacco: Never  Vaping Use   Vaping Use: Never used  Substance and Sexual Activity   Alcohol use: Yes    Alcohol/week: 6.0 standard drinks  Types: 6 Glasses of wine per week    Comment: couple times per week.   Drug use: Never   Sexual activity: Never  Other Topics Concern   Not on file  Social History Narrative   Not on file   Social Determinants of Health   Financial Resource Strain: Not on file  Food Insecurity: Not on file   Transportation Needs: Not on file  Physical Activity: Not on file  Stress: Not on file  Social Connections: Not on file  Intimate Partner Violence: Not on file    Review of Systems: All other review of systems negative except as mentioned in the HPI.  Physical Exam: Vital signs BP 121/71   Pulse 64   Temp 97.7 F (36.5 C) (Skin)   Resp 18   Ht '5\' 6"'$  (1.676 m)   Wt 222 lb (100.7 kg)   SpO2 99%   BMI 35.83 kg/m   General:   Alert,  Well-developed, well-nourished, pleasant and cooperative in NAD Lungs:  Clear throughout to auscultation.   Heart:  Regular rate and rhythm;  Abdomen:  Soft, nontender and nondistended.  Neuro/Psych:  Alert and cooperative. Normal mood and affect. A and O x 3  Jolly Mango, MD [facility]

## 2021-03-25 NOTE — Op Note (Signed)
[name] Procedure Date: 03/25/2021 1:28 PM MRN: [id number] Endoscopist: Remo Lipps P. Havery Moros , MD Age: 70 Referring MD:  Date of Birth: [birth year] Gender: Female Account #: [id number] Procedure:                Upper GI endoscopy Indications:              Dysphagia - prior EGD without clear cause in 2018                            but empiric dilation provided significant relief -                            now with some recurrence of symptoms, Abnormal CT                            of the GI tract - calcifications of stomach on CT                            Scan, history of GERD - patient reports being                            treated with pepcid (stopped PPI) Medicines:                Monitored Anesthesia Care Procedure:                Pre-Anesthesia Assessment:                           - Prior to the procedure, a History and Physical                            was performed, and patient medications and                            allergies were reviewed. The patient's tolerance of                            previous anesthesia was also reviewed. The risks                            and benefits of the procedure and the sedation                            options and risks were discussed with the patient.                            All questions were answered, and informed consent                            was obtained. Prior Anticoagulants: The patient has                            taken no previous anticoagulant or antiplatelet  agents. ASA Grade Assessment: III - A patient with                            severe systemic disease. After reviewing the risks                            and benefits, the patient was deemed in                            satisfactory condition to undergo the procedure.                           After obtaining informed consent, the endoscope was                            passed  under direct vision. Throughout the                            procedure, the patient's blood pressure, pulse, and                            oxygen saturations were monitored continuously. The                            Endoscope was introduced through the mouth, and                            advanced to the second part of duodenum. The upper                            GI endoscopy was accomplished without difficulty.                            The patient tolerated the procedure well. Scope In: Scope Out: Findings:                 Esophagogastric landmarks were identified: the                            Z-line was found at 38 cm, the gastroesophageal                            junction was found at 38 cm and the upper extent of                            the gastric folds was found at 39 cm from the                            incisors.                           A 1 cm hiatal hernia was present.  Very mild esophagitis noted in the distal                            esophagus. The exam of the esophagus was otherwise                            normal. No obvious stenosis stricture.                           A guidewire was placed and the scope was withdrawn.                            Empiric dilation was performed in the entire                            esophagus with a Savary dilator with mild                            resistance at 17 mm and 18 mm. Relook endoscopy                            showed a small mucosal wrent 18cm from the incisors.                           Patchy inflammation characterized by erythema,                            friability and granularity was found in the gastric                            body. Biopsies were taken with a cold forceps for                            histology.                           Multiple small sessile polyps were found in the                            gastric fundus and in the gastric body. Previously                             sampled on last endoscopy and noted to be fundic                            gland, similar appearance today, further biopsies                            not taken.                           The exam of the stomach was otherwise normal. No  calcifications noted anywhere in regards to CT Scan                           The duodenal bulb and second portion of the                            duodenum were normal. Complications:            No immediate complications. Estimated blood loss:                            Minimal. Estimated Blood Loss:     Estimated blood loss was minimal. Impression:               - Esophagogastric landmarks identified.                           - 1 cm hiatal hernia.                           - Mild distal esophagitis.                           - Normal esophagus otherwise - empiric dilation to                            34m performed with small appropriate mucosal wrent                            at 18cm                           - Gastritis. Biopsied.                           - Multiple gastric polyps, grossly consistent with                            benign fundic gland polyps                           - Normal stomach otherwise - no calcifications in                            regards to prior CT imaging.                           - Normal duodenal bulb and second portion of the                            duodenum. Recommendation:           - Patient has a contact number available for                            emergencies. The signs and symptoms of potential  delayed complications were discussed with the                            patient. Return to normal activities tomorrow.                            Written discharge instructions were provided to the                            patient.                           - Post dilation diet.                           - Continue present  medications.                           - Await pathology results.                           - Consider resuming PPI if otherwise no                            contraindications, patient endorses ongoing reflux                            despite pepcid and has mild esophagitis (previously                            on low dose nexium)                           - NO pathology to correlate to prior CT scan                            findings. Consider follow up imaging, will relay to                            Dr. Sunny Schlein P. Auron Tadros, MD 03/25/2021 1:58:00 PM This report has been signed electronically.

## 2021-03-25 NOTE — Progress Notes (Signed)
Pt's states no medical or surgical changes since previsit or office visit. VS assessed by D.T 

## 2021-03-25 NOTE — Patient Instructions (Addendum)
Follow Dilatation Diet given to you today- due to dilation of your esophagus done today  Handout esophagitis & gastritis given to you today   Await pathology on biopsies done of stomach    YOU HAD AN ENDOSCOPIC PROCEDURE TODAY AT [facility]:   Refer to the procedure report that was given to you for any specific questions about what was found during the examination.  If the procedure report does not answer your questions, please call your gastroenterologist to clarify.  If you requested that your care partner not be given the details of your procedure findings, then the procedure report has been included in a sealed envelope for you to review at your convenience later.  YOU SHOULD EXPECT: Some feelings of bloating in the abdomen. Passage of more gas than usual.  Walking can help get rid of the air that was put into your GI tract during the procedure and reduce the bloating. If you had a lower endoscopy (such as a colonoscopy or flexible sigmoidoscopy) you may notice spotting of blood in your stool or on the toilet paper. If you underwent a bowel prep for your procedure, you may not have a normal bowel movement for a few days.  Please Note:  You might notice some irritation and congestion in your nose or some drainage.  This is from the oxygen used during your procedure.  There is no need for concern and it should clear up in a day or so.  SYMPTOMS TO REPORT IMMEDIATELY:  Following lower endoscopy (colonoscopy or flexible sigmoidoscopy):  Excessive amounts of blood in the stool  Significant tenderness or worsening of abdominal pains  Swelling of the abdomen that is new, acute  Fever of 100F or higher   For urgent or emergent issues, a gastroenterologist can be reached at any hour by calling [phone]. Do not use MyChart messaging for urgent concerns.    DIET:  We do recommend a small meal at first, but then you may proceed to your regular diet.  Drink plenty of  fluids but you should avoid alcoholic beverages for 24 hours.  ACTIVITY:  You should plan to take it easy for the rest of today and you should NOT DRIVE or use heavy machinery until tomorrow (because of the sedation medicines used during the test).    FOLLOW UP: Our staff will call the number listed on your records 48-72 hours following your procedure to check on you and address any questions or concerns that you may have regarding the information given to you following your procedure. If we do not reach you, we will leave a message.  We will attempt to reach you two times.  During this call, we will ask if you have developed any symptoms of COVID 19. If you develop any symptoms (ie: fever, flu-like symptoms, shortness of breath, cough etc.) before then, please call [phone].  If you test positive for Covid 19 in the 2 weeks post procedure, please call and report this information to [name].    If any biopsies were taken you will be contacted by phone or by letter within the next 1-3 weeks.  Please call us at [phone] if you have not heard about the biopsies in 3 weeks.    SIGNATURES/CONFIDENTIALITY: You and/or your care partner have signed paperwork which will be entered into your electronic medical record.  These signatures attest to the fact that that the information above on your After Visit Summary has been reviewed and  is understood.  Full responsibility of the confidentiality of this discharge information lies with you and/or your care-partner.

## 2021-03-25 NOTE — Progress Notes (Signed)
1328 Robinul 0.1 mg IV given due large amount of secretions upon assessment.  MD made aware, vss

## 2021-03-28 DIAGNOSIS — M5416 Radiculopathy, lumbar region: Secondary | ICD-10-CM | POA: Diagnosis not present

## 2021-03-29 ENCOUNTER — Telehealth: Payer: Self-pay | Admitting: *Deleted

## 2021-03-29 DIAGNOSIS — H04123 Dry eye syndrome of bilateral lacrimal glands: Secondary | ICD-10-CM | POA: Diagnosis not present

## 2021-03-29 DIAGNOSIS — E119 Type 2 diabetes mellitus without complications: Secondary | ICD-10-CM | POA: Diagnosis not present

## 2021-03-29 DIAGNOSIS — H524 Presbyopia: Secondary | ICD-10-CM | POA: Diagnosis not present

## 2021-03-29 DIAGNOSIS — H353132 Nonexudative age-related macular degeneration, bilateral, intermediate dry stage: Secondary | ICD-10-CM | POA: Diagnosis not present

## 2021-03-29 NOTE — Telephone Encounter (Signed)
  Follow up Call-  Call back number 03/25/2021  Post procedure Call Back phone  # [phone]  Permission to leave phone message Yes  Some recent data might be hidden     Patient questions:  Do you have a fever, pain , or abdominal swelling? No. Pain Score  0 *  Have you tolerated food without any problems? Yes.    Have you been able to return to your normal activities? Yes.    Do you have any questions about your discharge instructions: Diet   No. Medications  No. Follow up visit  No.  Do you have questions or concerns about your Care? No.  Pt reports swallowing has improved post dilation.  Actions: * If pain score is 4 or above: No action needed, pain <4.   Have you developed a fever since your procedure? no  2.   Have you had an respiratory symptoms (SOB or cough) since your procedure? no  3.   Have you tested positive for COVID 19 since your procedure no  4.   Have you had any family members/close contacts diagnosed with the COVID 19 since your procedure?  no   If yes to any of these questions please route to Joylene John, RN and Joella Prince, RN

## 2021-03-30 DIAGNOSIS — M1990 Unspecified osteoarthritis, unspecified site: Secondary | ICD-10-CM | POA: Diagnosis not present

## 2021-03-30 DIAGNOSIS — E1169 Type 2 diabetes mellitus with other specified complication: Secondary | ICD-10-CM | POA: Diagnosis not present

## 2021-03-30 DIAGNOSIS — I10 Essential (primary) hypertension: Secondary | ICD-10-CM | POA: Diagnosis not present

## 2021-03-30 DIAGNOSIS — K219 Gastro-esophageal reflux disease without esophagitis: Secondary | ICD-10-CM | POA: Diagnosis not present

## 2021-03-30 DIAGNOSIS — E1149 Type 2 diabetes mellitus with other diabetic neurological complication: Secondary | ICD-10-CM | POA: Diagnosis not present

## 2021-03-30 DIAGNOSIS — F329 Major depressive disorder, single episode, unspecified: Secondary | ICD-10-CM | POA: Diagnosis not present

## 2021-03-30 DIAGNOSIS — G4700 Insomnia, unspecified: Secondary | ICD-10-CM | POA: Diagnosis not present

## 2021-03-30 DIAGNOSIS — I2510 Atherosclerotic heart disease of native coronary artery without angina pectoris: Secondary | ICD-10-CM | POA: Diagnosis not present

## 2021-03-30 DIAGNOSIS — D649 Anemia, unspecified: Secondary | ICD-10-CM | POA: Diagnosis not present

## 2021-03-31 DIAGNOSIS — M5416 Radiculopathy, lumbar region: Secondary | ICD-10-CM | POA: Diagnosis not present

## 2021-03-31 DIAGNOSIS — M545 Low back pain, unspecified: Secondary | ICD-10-CM | POA: Diagnosis not present

## 2021-04-05 ENCOUNTER — Other Ambulatory Visit: Payer: Self-pay

## 2021-04-05 DIAGNOSIS — R9389 Abnormal findings on diagnostic imaging of other specified body structures: Secondary | ICD-10-CM

## 2021-04-06 DIAGNOSIS — E1169 Type 2 diabetes mellitus with other specified complication: Secondary | ICD-10-CM | POA: Diagnosis not present

## 2021-04-07 DIAGNOSIS — H6123 Impacted cerumen, bilateral: Secondary | ICD-10-CM | POA: Diagnosis not present

## 2021-04-07 DIAGNOSIS — R0982 Postnasal drip: Secondary | ICD-10-CM | POA: Diagnosis not present

## 2021-04-07 DIAGNOSIS — J310 Chronic rhinitis: Secondary | ICD-10-CM | POA: Diagnosis not present

## 2021-04-11 DIAGNOSIS — E1169 Type 2 diabetes mellitus with other specified complication: Secondary | ICD-10-CM | POA: Diagnosis not present

## 2021-04-11 DIAGNOSIS — Z1389 Encounter for screening for other disorder: Secondary | ICD-10-CM | POA: Diagnosis not present

## 2021-04-11 DIAGNOSIS — Z23 Encounter for immunization: Secondary | ICD-10-CM | POA: Diagnosis not present

## 2021-04-11 DIAGNOSIS — Z0000 Encounter for general adult medical examination without abnormal findings: Secondary | ICD-10-CM | POA: Diagnosis not present

## 2021-04-12 DIAGNOSIS — M7661 Achilles tendinitis, right leg: Secondary | ICD-10-CM | POA: Diagnosis not present

## 2021-04-13 ENCOUNTER — Ambulatory Visit: Payer: Medicare PPO

## 2021-04-18 DIAGNOSIS — Z6834 Body mass index (BMI) 34.0-34.9, adult: Secondary | ICD-10-CM | POA: Diagnosis not present

## 2021-04-18 DIAGNOSIS — M5416 Radiculopathy, lumbar region: Secondary | ICD-10-CM | POA: Diagnosis not present

## 2021-05-04 ENCOUNTER — Ambulatory Visit (INDEPENDENT_AMBULATORY_CARE_PROVIDER_SITE_OTHER)
Admission: RE | Admit: 2021-05-04 | Discharge: 2021-05-04 | Disposition: A | Discharge status: Home / Self Care | Payer: Medicare PPO | Source: Ambulatory Visit | Attending: Gastroenterology | Admitting: Gastroenterology

## 2021-05-04 ENCOUNTER — Other Ambulatory Visit: Payer: Self-pay

## 2021-05-04 DIAGNOSIS — N2889 Other specified disorders of kidney and ureter: Secondary | ICD-10-CM | POA: Diagnosis not present

## 2021-05-04 DIAGNOSIS — Z87442 Personal history of urinary calculi: Secondary | ICD-10-CM | POA: Diagnosis not present

## 2021-05-04 DIAGNOSIS — R9389 Abnormal findings on diagnostic imaging of other specified body structures: Secondary | ICD-10-CM | POA: Diagnosis not present

## 2021-05-09 DIAGNOSIS — M25571 Pain in right ankle and joints of right foot: Secondary | ICD-10-CM | POA: Diagnosis not present

## 2021-05-11 DIAGNOSIS — F341 Dysthymic disorder: Secondary | ICD-10-CM | POA: Diagnosis not present

## 2021-05-12 ENCOUNTER — Ambulatory Visit
Admission: RE | Admit: 2021-05-12 | Discharge: 2021-05-12 | Disposition: A | Discharge status: Home / Self Care | Payer: Medicare PPO | Source: Ambulatory Visit | Attending: Sports Medicine | Admitting: Sports Medicine

## 2021-05-12 ENCOUNTER — Other Ambulatory Visit: Payer: Self-pay | Admitting: Sports Medicine

## 2021-05-12 DIAGNOSIS — R600 Localized edema: Secondary | ICD-10-CM | POA: Diagnosis not present

## 2021-05-12 DIAGNOSIS — M25571 Pain in right ankle and joints of right foot: Secondary | ICD-10-CM

## 2021-05-18 DIAGNOSIS — M79671 Pain in right foot: Secondary | ICD-10-CM | POA: Diagnosis not present

## 2021-05-18 DIAGNOSIS — R269 Unspecified abnormalities of gait and mobility: Secondary | ICD-10-CM | POA: Diagnosis not present

## 2021-05-18 DIAGNOSIS — M545 Low back pain, unspecified: Secondary | ICD-10-CM | POA: Diagnosis not present

## 2021-05-19 DIAGNOSIS — K219 Gastro-esophageal reflux disease without esophagitis: Secondary | ICD-10-CM | POA: Diagnosis not present

## 2021-05-19 DIAGNOSIS — E1169 Type 2 diabetes mellitus with other specified complication: Secondary | ICD-10-CM | POA: Diagnosis not present

## 2021-05-19 DIAGNOSIS — E7800 Pure hypercholesterolemia, unspecified: Secondary | ICD-10-CM | POA: Diagnosis not present

## 2021-05-19 DIAGNOSIS — I10 Essential (primary) hypertension: Secondary | ICD-10-CM | POA: Diagnosis not present

## 2021-05-19 DIAGNOSIS — G4700 Insomnia, unspecified: Secondary | ICD-10-CM | POA: Diagnosis not present

## 2021-05-19 DIAGNOSIS — E785 Hyperlipidemia, unspecified: Secondary | ICD-10-CM | POA: Diagnosis not present

## 2021-05-19 DIAGNOSIS — E1149 Type 2 diabetes mellitus with other diabetic neurological complication: Secondary | ICD-10-CM | POA: Diagnosis not present

## 2021-05-19 DIAGNOSIS — I2510 Atherosclerotic heart disease of native coronary artery without angina pectoris: Secondary | ICD-10-CM | POA: Diagnosis not present

## 2021-05-19 DIAGNOSIS — F329 Major depressive disorder, single episode, unspecified: Secondary | ICD-10-CM | POA: Diagnosis not present

## 2021-05-26 DIAGNOSIS — R269 Unspecified abnormalities of gait and mobility: Secondary | ICD-10-CM | POA: Diagnosis not present

## 2021-05-26 DIAGNOSIS — M79671 Pain in right foot: Secondary | ICD-10-CM | POA: Diagnosis not present

## 2021-05-26 DIAGNOSIS — M545 Low back pain, unspecified: Secondary | ICD-10-CM | POA: Diagnosis not present

## 2021-05-30 DIAGNOSIS — R269 Unspecified abnormalities of gait and mobility: Secondary | ICD-10-CM | POA: Diagnosis not present

## 2021-05-30 DIAGNOSIS — M545 Low back pain, unspecified: Secondary | ICD-10-CM | POA: Diagnosis not present

## 2021-05-30 DIAGNOSIS — F341 Dysthymic disorder: Secondary | ICD-10-CM | POA: Diagnosis not present

## 2021-05-30 DIAGNOSIS — M79671 Pain in right foot: Secondary | ICD-10-CM | POA: Diagnosis not present

## 2021-06-02 DIAGNOSIS — R269 Unspecified abnormalities of gait and mobility: Secondary | ICD-10-CM | POA: Diagnosis not present

## 2021-06-02 DIAGNOSIS — M79671 Pain in right foot: Secondary | ICD-10-CM | POA: Diagnosis not present

## 2021-06-02 DIAGNOSIS — M545 Low back pain, unspecified: Secondary | ICD-10-CM | POA: Diagnosis not present

## 2021-06-03 ENCOUNTER — Other Ambulatory Visit: Payer: Self-pay | Admitting: Sports Medicine

## 2021-06-03 DIAGNOSIS — M25571 Pain in right ankle and joints of right foot: Secondary | ICD-10-CM

## 2021-06-15 DIAGNOSIS — R269 Unspecified abnormalities of gait and mobility: Secondary | ICD-10-CM | POA: Diagnosis not present

## 2021-06-15 DIAGNOSIS — M545 Low back pain, unspecified: Secondary | ICD-10-CM | POA: Diagnosis not present

## 2021-06-15 DIAGNOSIS — M79671 Pain in right foot: Secondary | ICD-10-CM | POA: Diagnosis not present

## 2021-06-16 ENCOUNTER — Other Ambulatory Visit: Payer: Self-pay

## 2021-06-16 ENCOUNTER — Ambulatory Visit
Admission: RE | Admit: 2021-06-16 | Discharge: 2021-06-16 | Disposition: A | Discharge status: Home / Self Care | Payer: Medicare PPO | Source: Ambulatory Visit | Attending: Sports Medicine | Admitting: Sports Medicine

## 2021-06-16 DIAGNOSIS — R600 Localized edema: Secondary | ICD-10-CM | POA: Diagnosis not present

## 2021-06-16 DIAGNOSIS — M19071 Primary osteoarthritis, right ankle and foot: Secondary | ICD-10-CM | POA: Diagnosis not present

## 2021-06-16 DIAGNOSIS — M7989 Other specified soft tissue disorders: Secondary | ICD-10-CM | POA: Diagnosis not present

## 2021-06-16 DIAGNOSIS — M25571 Pain in right ankle and joints of right foot: Secondary | ICD-10-CM

## 2021-06-17 DIAGNOSIS — F900 Attention-deficit hyperactivity disorder, predominantly inattentive type: Secondary | ICD-10-CM | POA: Diagnosis not present

## 2021-06-17 DIAGNOSIS — G4700 Insomnia, unspecified: Secondary | ICD-10-CM | POA: Diagnosis not present

## 2021-06-17 DIAGNOSIS — F411 Generalized anxiety disorder: Secondary | ICD-10-CM | POA: Diagnosis not present

## 2021-06-17 DIAGNOSIS — F422 Mixed obsessional thoughts and acts: Secondary | ICD-10-CM | POA: Diagnosis not present

## 2021-06-20 DIAGNOSIS — F329 Major depressive disorder, single episode, unspecified: Secondary | ICD-10-CM | POA: Diagnosis not present

## 2021-06-20 DIAGNOSIS — D649 Anemia, unspecified: Secondary | ICD-10-CM | POA: Diagnosis not present

## 2021-06-20 DIAGNOSIS — E1149 Type 2 diabetes mellitus with other diabetic neurological complication: Secondary | ICD-10-CM | POA: Diagnosis not present

## 2021-06-20 DIAGNOSIS — G4700 Insomnia, unspecified: Secondary | ICD-10-CM | POA: Diagnosis not present

## 2021-06-20 DIAGNOSIS — E7800 Pure hypercholesterolemia, unspecified: Secondary | ICD-10-CM | POA: Diagnosis not present

## 2021-06-20 DIAGNOSIS — I10 Essential (primary) hypertension: Secondary | ICD-10-CM | POA: Diagnosis not present

## 2021-06-20 DIAGNOSIS — E1169 Type 2 diabetes mellitus with other specified complication: Secondary | ICD-10-CM | POA: Diagnosis not present

## 2021-06-20 DIAGNOSIS — B351 Tinea unguium: Secondary | ICD-10-CM | POA: Diagnosis not present

## 2021-06-20 DIAGNOSIS — K219 Gastro-esophageal reflux disease without esophagitis: Secondary | ICD-10-CM | POA: Diagnosis not present

## 2021-06-20 DIAGNOSIS — Z6835 Body mass index (BMI) 35.0-35.9, adult: Secondary | ICD-10-CM | POA: Diagnosis not present

## 2021-06-20 DIAGNOSIS — L608 Other nail disorders: Secondary | ICD-10-CM | POA: Diagnosis not present

## 2021-06-22 DIAGNOSIS — F341 Dysthymic disorder: Secondary | ICD-10-CM | POA: Diagnosis not present

## 2021-06-27 ENCOUNTER — Ambulatory Visit: Payer: Medicare PPO | Admitting: Family Medicine

## 2021-06-27 VITALS — BP 122/84 | Ht 66.5 in | Wt 226.0 lb

## 2021-06-27 DIAGNOSIS — M545 Low back pain, unspecified: Secondary | ICD-10-CM | POA: Diagnosis not present

## 2021-06-27 DIAGNOSIS — M6528 Calcific tendinitis, other site: Secondary | ICD-10-CM | POA: Diagnosis not present

## 2021-06-27 DIAGNOSIS — M79671 Pain in right foot: Secondary | ICD-10-CM | POA: Diagnosis not present

## 2021-06-27 DIAGNOSIS — R269 Unspecified abnormalities of gait and mobility: Secondary | ICD-10-CM | POA: Diagnosis not present

## 2021-06-27 NOTE — Progress Notes (Addendum)
[name] - 70 y.o. female MRN [id number]  Date of birth: 06-17-1951  SUBJECTIVE:   CC: R achilles heal pain  [name] is a 70 year old Caucasian female with pertinent history of left Achilles tendinopathy status post surgical repair and right Achilles tendinopathy which is being managed conservatively, who presents for discussion of shockwave therapy for right Achilles tendinopathy.  Patient has had 6 months of right ankle pain for which she saw her PCP at [facility] go for college recommended she see sports medicine.  Patient is being followed by Dr. Inez Catalina with sports medicine.  Patient reports she has tried more strenuous physical therapy program 4 weeks in duration which worsened her right ankle pain.  She has also tried an ankle boot and reports improvements while her foot is in the boot though after coming out of the boot, she feels she did not have improvement in pain.  She has been seen by physical therapist DPT O'Halloran and is participating in more gentle pool therapy and receiving [place] and massage for her right ankle.  She has previously tried taking Aleve however this gave her heartburn and had to be stopped.  She is now occasionally taking Tylenol but prefers to avoid medications.  She is using nitro patches and has used them for a 2.5 months.  She is not currently icing or using compressive bandages though is able to elevate the leg in the evenings.  She had an MRI completed 06/16/2021 which showed significant chronic appearing mid distal Achilles tendinopathy with interstitial tears and a small partial-thickness tear involving the deep distal attachment fibers with no complete tear or rupture.  Also showed intact medial and lateral ankle ligaments and tendons with mild midfoot degenerative changes.  Patient has persistent pain and is looking for therapy options such as shockwave therapy in hopes of avoiding surgery.  ROS: Negative aside from above.  DATA  REVIEWED:  DG RIGHT ANKLE - COMPLETE 3+ VIEW: IMPRESSION: No fracture or dislocation of the right ankle. Diffuse soft tissue edema. By: Delanna Ahmadi M.D.   On: 05/16/2021 14:34  MRI OF THE RIGHT ANKLE WITHOUT CONTRAST IMPRESSION: 1. Significant chronic appearing mid distal Achilles tendinopathy with interstitial tears and a small partial thickness tear involving the deep distal attachment fibers. No complete tear/rupture. 2. Intact medial and lateral ankle ligaments and tendons. 3. Mild midfoot degenerative changes. By: Marijo Sanes M.D.   On: 06/16/2021 18:45  PHYSICAL EXAM:  VS: BP:122/84  HR: bpm  TEMP: ( )  RESP:   HT:5' 6.5" (168.9 cm)   WT:226 lb (102.5 kg)  BMI:35.94 PHYSICAL EXAM: Gen: NAD, alert, cooperative with exam, well-appearing HEENT: clear conjunctiva, nontraumatic CV: Trace bilateral lower extremity edema, extremities well perfused Resp: non-labored breathing on room air Skin: no rashes, normal turgor  Neuro: no gross deficits  Psych:  alert and oriented MSK: Tenderness to palpation Achilles insertion point on calcaneus.  Normal ROM and 5/5 strength with dorsiflexion, plantarflexion, inversion and eversion without pain.  Ultrasound of right Achilles: Thickening of the Achilles tendon at the insertion point.  Multiple calcifications present within Achilles tendon at insertion and one about 1cm proximal to insertion.  Small partial tears visible.  Mild neovascularity surrounding achilles.  ASSESSMENT & PLAN: Right Achilles tendinopathy - patient is a good candidate for ECSWT for her calcific achilles tendinopathy.  Continue treatment with Dr. Sheppard Coil.  F/u in 1 week for second treatment.  Plan for 3-4 treatments.  Procedure: ECSWT Indications:  Insertional  right calcific achilles tendinopathy   Procedure Details Consent: Risks of procedure as well as the alternatives and risks of each were explained to the patient.  Written consent for procedure  obtained. Time Out: Verified patient identification, verified procedure, site was marked, verified correct patient position, medications/allergies/relevent history reviewed.  The area was cleaned with alcohol swab.     The Right achilles tendon was targeted for Extracorporeal shockwave therapy.    Preset: achillodynia Power Level: 70 Frequency: 7 Impulse/cycles: 2000 Head size: large   Patient tolerated procedure well without immediate complications    Portions of this report may have been transcribed using voice recognition software. Every effort was made to ensure accuracy; however, inadvertent computerized transcription errors may be present.   Carol Denis, MD 06/27/21,  4:44 PM Pager: [phone] Internal Medicine Resident, PGY-1 [name]    Addendum:  Patient seen and examined with resident.  Agree with her note and findings.  I performed ECSWT as noted above.

## 2021-06-28 ENCOUNTER — Encounter: Payer: Self-pay | Admitting: Family Medicine

## 2021-07-01 DIAGNOSIS — M79671 Pain in right foot: Secondary | ICD-10-CM | POA: Diagnosis not present

## 2021-07-01 DIAGNOSIS — M545 Low back pain, unspecified: Secondary | ICD-10-CM | POA: Diagnosis not present

## 2021-07-01 DIAGNOSIS — R269 Unspecified abnormalities of gait and mobility: Secondary | ICD-10-CM | POA: Diagnosis not present

## 2021-07-04 ENCOUNTER — Other Ambulatory Visit: Payer: Self-pay

## 2021-07-04 ENCOUNTER — Encounter: Payer: Self-pay | Admitting: Podiatry

## 2021-07-04 ENCOUNTER — Ambulatory Visit: Payer: Medicare PPO | Admitting: Podiatry

## 2021-07-04 DIAGNOSIS — L84 Corns and callosities: Secondary | ICD-10-CM | POA: Diagnosis not present

## 2021-07-04 DIAGNOSIS — E1169 Type 2 diabetes mellitus with other specified complication: Secondary | ICD-10-CM | POA: Diagnosis not present

## 2021-07-04 DIAGNOSIS — R234 Changes in skin texture: Secondary | ICD-10-CM | POA: Diagnosis not present

## 2021-07-04 DIAGNOSIS — M79674 Pain in right toe(s): Secondary | ICD-10-CM

## 2021-07-04 DIAGNOSIS — B351 Tinea unguium: Secondary | ICD-10-CM

## 2021-07-04 DIAGNOSIS — M79675 Pain in left toe(s): Secondary | ICD-10-CM | POA: Diagnosis not present

## 2021-07-04 MED ORDER — CICLOPIROX 8 % EX SOLN: Freq: Every day | CUTANEOUS | 0 refills | Status: DC

## 2021-07-04 NOTE — Progress Notes (Signed)
  Subjective:  Patient ID: [name], female    DOB: [birth year],   MRN: [id number]  Chief Complaint  Patient presents with   Nail Problem    Nail discoloration    70 y.o. female presents for concern of nail discoloration and possible fungus vs something worse. Patient relates she started to have dark streaks in her nails a few months ago. Denies any pain. Also hoping to have nails trimmed today and calluses. . Denies any other pedal complaints. Denies n/v/f/c.   PCP: Kathyrn Lass MD   Past Medical History:  Diagnosis Date   Arthritis    Atherosclerosis    Back pain    Cancer ([place]) 2000   hx of skin cancer , basal cell   Chronic fatigue    and weakness   Colon polyps    Constipation    Coronary artery disease    nonobstructive with 20% OM by cath 2006   Depression    Diabetes mellitus without complication ([place])    type 2    Diastolic dysfunction    w Elevated LVEDP   DJD (degenerative joint disease)    C3/4 Dr Sherwood Gambler   Edema, lower extremity    GERD (gastroesophageal reflux disease)    High cholesterol    History of kidney stones    Hypertension    Hypoglycemia    Kidney stones    Multiple thyroid nodules    Neurocardiogenic syncope    Obesity    Osteoarthritis    PONV (postoperative nausea and vomiting)    Prediabetes 2011 & 2012   Right knee pain 12/2009   Dr Marlou Sa   Sleep apnea    CPAP had Sleep Study ordered by Dr. Radford Pax    Tick bite 11/2016   Vasovagal syncope     Objective:  Physical Exam: Vascular: DP/PT pulses 2/4 bilateral. CFT <3 seconds. Normal hair growth on digits. No edema.  Skin. No lacerations or abrasions bilateral feet. Nails 1-5 are thickened discolored and elongated with subungual debris.  Hyperkeratotic tissue noted to bilateral heels and bilateral hallux medially.  Musculoskeletal: MMT 5/5 bilateral lower extremities in DF, PF, Inversion and Eversion. Deceased ROM in DF of ankle joint.  Neurological: Sensation intact to light  touch.   Assessment:   1. Type 2 diabetes mellitus with other specified complication, without long-term current use of insulin (HCC)   2. Callus of foot   3. Fissured skin   4. Onychomycosis   5. Pain due to onychomycosis of toenails of both feet      Plan:  Patient was evaluated and treated and all questions answered. -Discussed and educated patient on diabetic foot care, especially with  regards to the vascular, neurological and musculoskeletal systems.  -Stressed the importance of good glycemic control and the detriment of not  controlling glucose levels in relation to the foot. -Discussed supportive shoes at all times and checking feet regularly.  -Mechanically debrided all nails 1-5 bilateral using sterile nail nipper and filed with dremel without incident  -Prescription for penalc provided.  -Hyperkeratotic tissue debrided without incident -Answered all patient questions -Patient to return  in 3 months for at risk foot care -Patient advised to call the office if any problems or questions arise in the meantime.   Lorenda Peck, DPM

## 2021-07-06 ENCOUNTER — Ambulatory Visit (INDEPENDENT_AMBULATORY_CARE_PROVIDER_SITE_OTHER): Payer: Self-pay | Admitting: Family Medicine

## 2021-07-06 DIAGNOSIS — M6528 Calcific tendinitis, other site: Secondary | ICD-10-CM

## 2021-07-07 DIAGNOSIS — R269 Unspecified abnormalities of gait and mobility: Secondary | ICD-10-CM | POA: Diagnosis not present

## 2021-07-07 DIAGNOSIS — M545 Low back pain, unspecified: Secondary | ICD-10-CM | POA: Diagnosis not present

## 2021-07-07 DIAGNOSIS — M79671 Pain in right foot: Secondary | ICD-10-CM | POA: Diagnosis not present

## 2021-07-07 NOTE — Progress Notes (Signed)
Patient returns for second treatment for her right achilles calcific tendinopathy.  Had more soreness after the procedure with some localized swelling.  Procedure: ECSWT Indications:  insertional right calcific achilles tendinopathy   Procedure Details Consent: Risks of procedure as well as the alternatives and risks of each were explained to the patient.  Written consent for procedure obtained. Time Out: Verified patient identification, verified procedure, site was marked, verified correct patient position, medications/allergies/relevent history reviewed.  The area was cleaned with alcohol swab.     The right achilles tendon was targeted for Extracorporeal shockwave therapy.    Preset: achillodynia Power Level: 80 Frequency: 8 Impulse/cycles: 2000  Head size: large   Patient tolerated procedure well without immediate complications   Follow up in 1 week for third treatment.

## 2021-07-12 DIAGNOSIS — R269 Unspecified abnormalities of gait and mobility: Secondary | ICD-10-CM | POA: Diagnosis not present

## 2021-07-12 DIAGNOSIS — M545 Low back pain, unspecified: Secondary | ICD-10-CM | POA: Diagnosis not present

## 2021-07-12 DIAGNOSIS — M79671 Pain in right foot: Secondary | ICD-10-CM | POA: Diagnosis not present

## 2021-07-13 ENCOUNTER — Ambulatory Visit: Payer: Medicare PPO | Admitting: Family Medicine

## 2021-07-14 DIAGNOSIS — R269 Unspecified abnormalities of gait and mobility: Secondary | ICD-10-CM | POA: Diagnosis not present

## 2021-07-14 DIAGNOSIS — M79671 Pain in right foot: Secondary | ICD-10-CM | POA: Diagnosis not present

## 2021-07-14 DIAGNOSIS — M545 Low back pain, unspecified: Secondary | ICD-10-CM | POA: Diagnosis not present

## 2021-07-15 ENCOUNTER — Encounter: Payer: Self-pay | Admitting: Family Medicine

## 2021-07-15 ENCOUNTER — Ambulatory Visit (INDEPENDENT_AMBULATORY_CARE_PROVIDER_SITE_OTHER): Payer: Self-pay | Admitting: Family Medicine

## 2021-07-15 VITALS — Ht 66.5 in

## 2021-07-15 DIAGNOSIS — M6528 Calcific tendinitis, other site: Secondary | ICD-10-CM

## 2021-07-15 NOTE — Progress Notes (Signed)
Patient returns for third treatment for her right achilles.  Overall noting mild improvement with each treatment, soreness day after.  Procedure: ECSWT Indications:  insertional right calcific achilles tendinopathy   Procedure Details Consent: Risks of procedure as well as the alternatives and risks of each were explained to the patient.  Written consent for procedure obtained. Time Out: Verified patient identification, verified procedure, site was marked, verified correct patient position, medications/allergies/relevent history reviewed.  The area was cleaned with alcohol swab.     The right achilles tendon was targeted for Extracorporeal shockwave therapy.    Preset: achillodynia Power Level: 90 Frequency: 9 Impulse/cycles: 2000 Head size: large   Patient tolerated procedure well without immediate complications

## 2021-07-20 ENCOUNTER — Ambulatory Visit (INDEPENDENT_AMBULATORY_CARE_PROVIDER_SITE_OTHER): Payer: Self-pay | Admitting: Family Medicine

## 2021-07-20 VITALS — Ht 65.5 in

## 2021-07-20 DIAGNOSIS — M6528 Calcific tendinitis, other site: Secondary | ICD-10-CM

## 2021-07-21 ENCOUNTER — Encounter: Payer: Self-pay | Admitting: Family Medicine

## 2021-07-21 NOTE — Progress Notes (Signed)
Patient returns for fourth treatment for her right achilles.  Again noted mild improvement compared to last visit and with treatments overall.  Pain more lateral at achilles insertion primarily.  Procedure: ECSWT Indications:  insertional right calcific achilles tendinopathy   Procedure Details Consent: Risks of procedure as well as the alternatives and risks of each were explained to the patient.  Written consent for procedure obtained. Time Out: Verified patient identification, verified procedure, site was marked, verified correct patient position, medications/allergies/relevent history reviewed.  The area was cleaned with alcohol swab.     The right achilles tendonwas targeted for Extracorporeal shockwave therapy.    Preset: achillodynia Power Level: 90 Frequency: 9 Impulse/cycles: 2000 Head size: large   Patient tolerated procedure well without immediate complications

## 2021-08-01 DIAGNOSIS — M79671 Pain in right foot: Secondary | ICD-10-CM | POA: Diagnosis not present

## 2021-08-01 DIAGNOSIS — R269 Unspecified abnormalities of gait and mobility: Secondary | ICD-10-CM | POA: Diagnosis not present

## 2021-08-01 DIAGNOSIS — M545 Low back pain, unspecified: Secondary | ICD-10-CM | POA: Diagnosis not present

## 2021-08-03 DIAGNOSIS — F341 Dysthymic disorder: Secondary | ICD-10-CM | POA: Diagnosis not present

## 2021-08-08 DIAGNOSIS — M545 Low back pain, unspecified: Secondary | ICD-10-CM | POA: Diagnosis not present

## 2021-08-08 DIAGNOSIS — R269 Unspecified abnormalities of gait and mobility: Secondary | ICD-10-CM | POA: Diagnosis not present

## 2021-08-08 DIAGNOSIS — M79671 Pain in right foot: Secondary | ICD-10-CM | POA: Diagnosis not present

## 2021-08-12 DIAGNOSIS — M79671 Pain in right foot: Secondary | ICD-10-CM | POA: Diagnosis not present

## 2021-08-12 DIAGNOSIS — R269 Unspecified abnormalities of gait and mobility: Secondary | ICD-10-CM | POA: Diagnosis not present

## 2021-08-12 DIAGNOSIS — M545 Low back pain, unspecified: Secondary | ICD-10-CM | POA: Diagnosis not present

## 2021-08-15 DIAGNOSIS — F341 Dysthymic disorder: Secondary | ICD-10-CM | POA: Diagnosis not present

## 2021-08-17 DIAGNOSIS — M79671 Pain in right foot: Secondary | ICD-10-CM | POA: Diagnosis not present

## 2021-08-17 DIAGNOSIS — M545 Low back pain, unspecified: Secondary | ICD-10-CM | POA: Diagnosis not present

## 2021-08-17 DIAGNOSIS — R269 Unspecified abnormalities of gait and mobility: Secondary | ICD-10-CM | POA: Diagnosis not present

## 2021-08-22 DIAGNOSIS — M545 Low back pain, unspecified: Secondary | ICD-10-CM | POA: Diagnosis not present

## 2021-08-22 DIAGNOSIS — R269 Unspecified abnormalities of gait and mobility: Secondary | ICD-10-CM | POA: Diagnosis not present

## 2021-08-22 DIAGNOSIS — M79671 Pain in right foot: Secondary | ICD-10-CM | POA: Diagnosis not present

## 2021-08-29 DIAGNOSIS — M79671 Pain in right foot: Secondary | ICD-10-CM | POA: Diagnosis not present

## 2021-08-29 DIAGNOSIS — M545 Low back pain, unspecified: Secondary | ICD-10-CM | POA: Diagnosis not present

## 2021-08-29 DIAGNOSIS — R269 Unspecified abnormalities of gait and mobility: Secondary | ICD-10-CM | POA: Diagnosis not present

## 2021-08-31 NOTE — Progress Notes (Signed)
Pt called requesting referral to foot specialist per the discussion at her last visit.  Referral placed to Dr. [facility] [address]. [place], [place]  Appt: 09/02/21 @ 9:45 am

## 2021-09-02 DIAGNOSIS — M79671 Pain in right foot: Secondary | ICD-10-CM | POA: Diagnosis not present

## 2021-09-02 DIAGNOSIS — R269 Unspecified abnormalities of gait and mobility: Secondary | ICD-10-CM | POA: Diagnosis not present

## 2021-09-02 DIAGNOSIS — M545 Low back pain, unspecified: Secondary | ICD-10-CM | POA: Diagnosis not present

## 2021-09-05 DIAGNOSIS — M7661 Achilles tendinitis, right leg: Secondary | ICD-10-CM | POA: Diagnosis not present

## 2021-09-05 DIAGNOSIS — M79674 Pain in right toe(s): Secondary | ICD-10-CM | POA: Diagnosis not present

## 2021-09-07 DIAGNOSIS — F341 Dysthymic disorder: Secondary | ICD-10-CM | POA: Diagnosis not present

## 2021-09-14 DIAGNOSIS — M545 Low back pain, unspecified: Secondary | ICD-10-CM | POA: Diagnosis not present

## 2021-09-14 DIAGNOSIS — M79671 Pain in right foot: Secondary | ICD-10-CM | POA: Diagnosis not present

## 2021-09-14 DIAGNOSIS — R269 Unspecified abnormalities of gait and mobility: Secondary | ICD-10-CM | POA: Diagnosis not present

## 2021-09-15 DIAGNOSIS — G4700 Insomnia, unspecified: Secondary | ICD-10-CM | POA: Diagnosis not present

## 2021-09-15 DIAGNOSIS — F411 Generalized anxiety disorder: Secondary | ICD-10-CM | POA: Diagnosis not present

## 2021-09-15 DIAGNOSIS — F3341 Major depressive disorder, recurrent, in partial remission: Secondary | ICD-10-CM | POA: Diagnosis not present

## 2021-09-15 DIAGNOSIS — F422 Mixed obsessional thoughts and acts: Secondary | ICD-10-CM | POA: Diagnosis not present

## 2021-09-21 DIAGNOSIS — F341 Dysthymic disorder: Secondary | ICD-10-CM | POA: Diagnosis not present

## 2021-09-23 DIAGNOSIS — R269 Unspecified abnormalities of gait and mobility: Secondary | ICD-10-CM | POA: Diagnosis not present

## 2021-09-23 DIAGNOSIS — M545 Low back pain, unspecified: Secondary | ICD-10-CM | POA: Diagnosis not present

## 2021-09-23 DIAGNOSIS — M79671 Pain in right foot: Secondary | ICD-10-CM | POA: Diagnosis not present

## 2021-10-05 DIAGNOSIS — R269 Unspecified abnormalities of gait and mobility: Secondary | ICD-10-CM | POA: Diagnosis not present

## 2021-10-05 DIAGNOSIS — M545 Low back pain, unspecified: Secondary | ICD-10-CM | POA: Diagnosis not present

## 2021-10-05 DIAGNOSIS — M79671 Pain in right foot: Secondary | ICD-10-CM | POA: Diagnosis not present

## 2021-10-07 DIAGNOSIS — R269 Unspecified abnormalities of gait and mobility: Secondary | ICD-10-CM | POA: Diagnosis not present

## 2021-10-07 DIAGNOSIS — M79671 Pain in right foot: Secondary | ICD-10-CM | POA: Diagnosis not present

## 2021-10-07 DIAGNOSIS — M545 Low back pain, unspecified: Secondary | ICD-10-CM | POA: Diagnosis not present

## 2021-10-07 DIAGNOSIS — M7661 Achilles tendinitis, right leg: Secondary | ICD-10-CM | POA: Diagnosis not present

## 2021-10-10 DIAGNOSIS — E1169 Type 2 diabetes mellitus with other specified complication: Secondary | ICD-10-CM | POA: Diagnosis not present

## 2021-10-10 DIAGNOSIS — L710 Perioral dermatitis: Secondary | ICD-10-CM | POA: Diagnosis not present

## 2021-10-10 DIAGNOSIS — I10 Essential (primary) hypertension: Secondary | ICD-10-CM | POA: Diagnosis not present

## 2021-10-10 DIAGNOSIS — I700 Atherosclerosis of aorta: Secondary | ICD-10-CM | POA: Diagnosis not present

## 2021-10-14 DIAGNOSIS — R269 Unspecified abnormalities of gait and mobility: Secondary | ICD-10-CM | POA: Diagnosis not present

## 2021-10-14 DIAGNOSIS — M545 Low back pain, unspecified: Secondary | ICD-10-CM | POA: Diagnosis not present

## 2021-10-14 DIAGNOSIS — M79671 Pain in right foot: Secondary | ICD-10-CM | POA: Diagnosis not present

## 2021-10-18 ENCOUNTER — Other Ambulatory Visit: Payer: Self-pay | Admitting: Podiatry

## 2021-10-18 NOTE — Telephone Encounter (Signed)
Please advise 

## 2021-10-19 DIAGNOSIS — R269 Unspecified abnormalities of gait and mobility: Secondary | ICD-10-CM | POA: Diagnosis not present

## 2021-10-19 DIAGNOSIS — M79671 Pain in right foot: Secondary | ICD-10-CM | POA: Diagnosis not present

## 2021-10-19 DIAGNOSIS — M545 Low back pain, unspecified: Secondary | ICD-10-CM | POA: Diagnosis not present

## 2021-10-19 DIAGNOSIS — F341 Dysthymic disorder: Secondary | ICD-10-CM | POA: Diagnosis not present

## 2021-10-21 DIAGNOSIS — M545 Low back pain, unspecified: Secondary | ICD-10-CM | POA: Diagnosis not present

## 2021-10-21 DIAGNOSIS — M79671 Pain in right foot: Secondary | ICD-10-CM | POA: Diagnosis not present

## 2021-10-21 DIAGNOSIS — R269 Unspecified abnormalities of gait and mobility: Secondary | ICD-10-CM | POA: Diagnosis not present

## 2021-10-31 ENCOUNTER — Telehealth: Payer: Self-pay

## 2021-10-31 DIAGNOSIS — E7800 Pure hypercholesterolemia, unspecified: Secondary | ICD-10-CM

## 2021-10-31 NOTE — Telephone Encounter (Signed)
-----   Message from [name], MD sent at 10/31/2021  3:36 PM EDT ----- ?Please repeat lipids fasting ?----- Message ----- ?From: Antonieta Iba, RN ?Sent: 10/31/2021   2:26 PM EDT ?To: [name], MD ? ?Patient reports that she was not fasting for her lab work.  ? ?

## 2021-10-31 NOTE — Telephone Encounter (Signed)
Left message for patient to call back and schedule lab work.  ?Orders for labs have been placed.  ?

## 2021-11-03 DIAGNOSIS — M545 Low back pain, unspecified: Secondary | ICD-10-CM | POA: Diagnosis not present

## 2021-11-03 DIAGNOSIS — R269 Unspecified abnormalities of gait and mobility: Secondary | ICD-10-CM | POA: Diagnosis not present

## 2021-11-03 DIAGNOSIS — M79671 Pain in right foot: Secondary | ICD-10-CM | POA: Diagnosis not present

## 2021-11-09 DIAGNOSIS — M79671 Pain in right foot: Secondary | ICD-10-CM | POA: Diagnosis not present

## 2021-11-09 DIAGNOSIS — R269 Unspecified abnormalities of gait and mobility: Secondary | ICD-10-CM | POA: Diagnosis not present

## 2021-11-09 DIAGNOSIS — M545 Low back pain, unspecified: Secondary | ICD-10-CM | POA: Diagnosis not present

## 2021-11-11 DIAGNOSIS — M79671 Pain in right foot: Secondary | ICD-10-CM | POA: Diagnosis not present

## 2021-11-11 DIAGNOSIS — M545 Low back pain, unspecified: Secondary | ICD-10-CM | POA: Diagnosis not present

## 2021-11-11 DIAGNOSIS — R269 Unspecified abnormalities of gait and mobility: Secondary | ICD-10-CM | POA: Diagnosis not present

## 2021-11-14 DIAGNOSIS — M79671 Pain in right foot: Secondary | ICD-10-CM | POA: Diagnosis not present

## 2021-11-14 DIAGNOSIS — M545 Low back pain, unspecified: Secondary | ICD-10-CM | POA: Diagnosis not present

## 2021-11-14 DIAGNOSIS — M7661 Achilles tendinitis, right leg: Secondary | ICD-10-CM | POA: Diagnosis not present

## 2021-11-14 DIAGNOSIS — R269 Unspecified abnormalities of gait and mobility: Secondary | ICD-10-CM | POA: Diagnosis not present

## 2021-11-15 DIAGNOSIS — K219 Gastro-esophageal reflux disease without esophagitis: Secondary | ICD-10-CM | POA: Diagnosis not present

## 2021-11-15 DIAGNOSIS — F329 Major depressive disorder, single episode, unspecified: Secondary | ICD-10-CM | POA: Diagnosis not present

## 2021-11-15 DIAGNOSIS — E785 Hyperlipidemia, unspecified: Secondary | ICD-10-CM | POA: Diagnosis not present

## 2021-11-15 DIAGNOSIS — E1169 Type 2 diabetes mellitus with other specified complication: Secondary | ICD-10-CM | POA: Diagnosis not present

## 2021-11-15 DIAGNOSIS — G4700 Insomnia, unspecified: Secondary | ICD-10-CM | POA: Diagnosis not present

## 2021-11-15 DIAGNOSIS — I10 Essential (primary) hypertension: Secondary | ICD-10-CM | POA: Diagnosis not present

## 2021-11-16 DIAGNOSIS — R269 Unspecified abnormalities of gait and mobility: Secondary | ICD-10-CM | POA: Diagnosis not present

## 2021-11-16 DIAGNOSIS — M545 Low back pain, unspecified: Secondary | ICD-10-CM | POA: Diagnosis not present

## 2021-11-16 DIAGNOSIS — M79671 Pain in right foot: Secondary | ICD-10-CM | POA: Diagnosis not present

## 2021-11-21 ENCOUNTER — Other Ambulatory Visit: Payer: Medicare PPO

## 2021-11-21 DIAGNOSIS — M79671 Pain in right foot: Secondary | ICD-10-CM | POA: Diagnosis not present

## 2021-11-21 DIAGNOSIS — M545 Low back pain, unspecified: Secondary | ICD-10-CM | POA: Diagnosis not present

## 2021-11-21 DIAGNOSIS — R269 Unspecified abnormalities of gait and mobility: Secondary | ICD-10-CM | POA: Diagnosis not present

## 2021-11-22 ENCOUNTER — Other Ambulatory Visit: Payer: Medicare PPO

## 2021-11-22 IMAGING — MG DIGITAL DIAGNOSTIC BILAT W/ TOMO W/ CAD
8 of 14 series · 8 of 40 positions shown · non-contrast
Comparison: Previous exam(s).

CLINICAL DATA: 68-year-old female presenting with a new lump in the
right breast as well as a palpable site of concern in the left
axilla.

EXAM:
DIGITAL DIAGNOSTIC BILATERAL MAMMOGRAM WITH CAD AND TOMO
ULTRASOUND LEFT BREAST

[L MLO synth-2D]
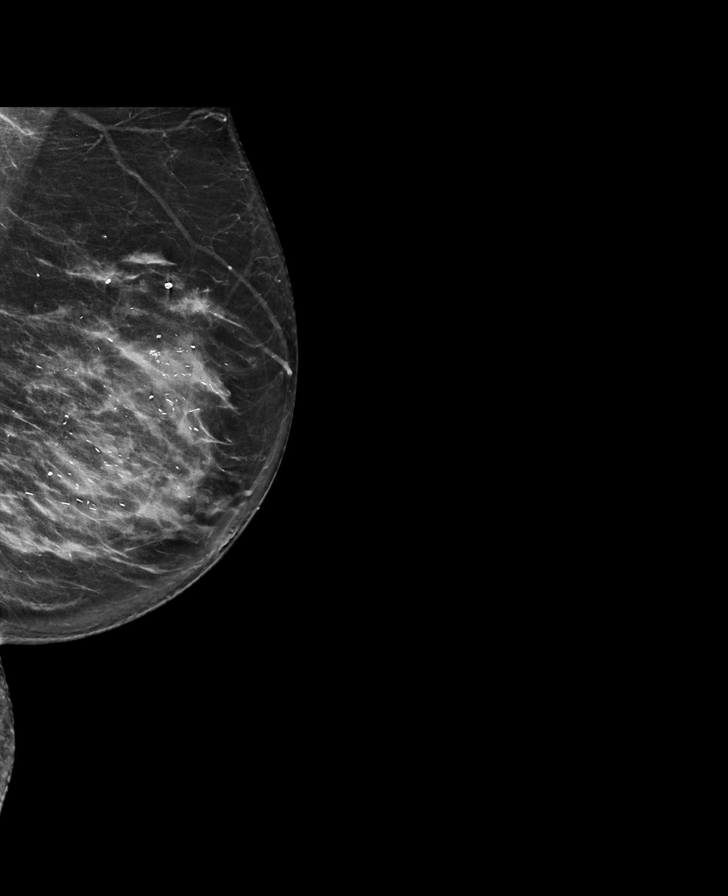

[R CC synth-2D (1 of 2)]
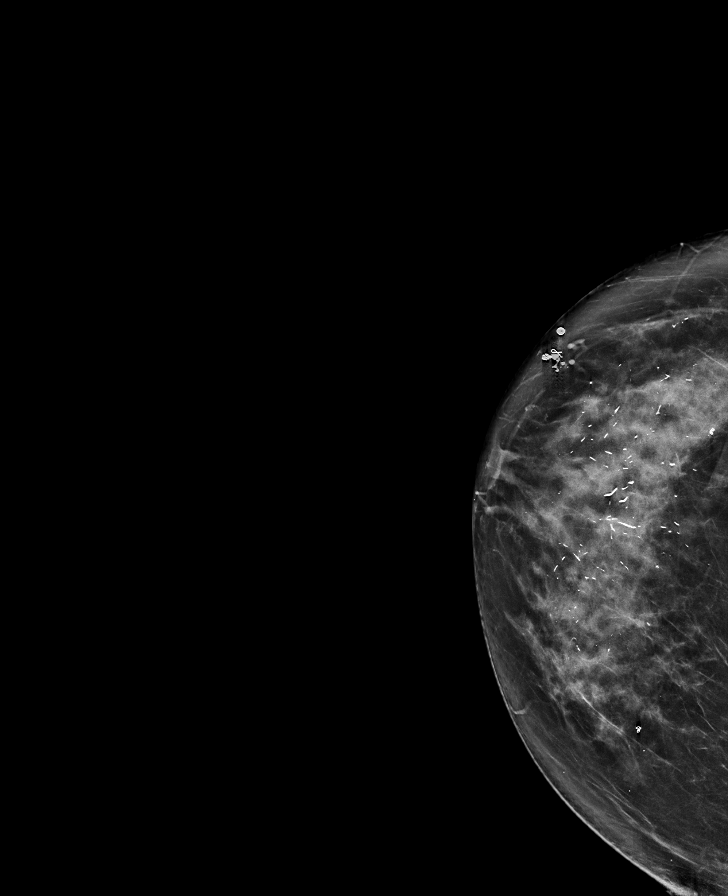

[R TAN synth-2D]
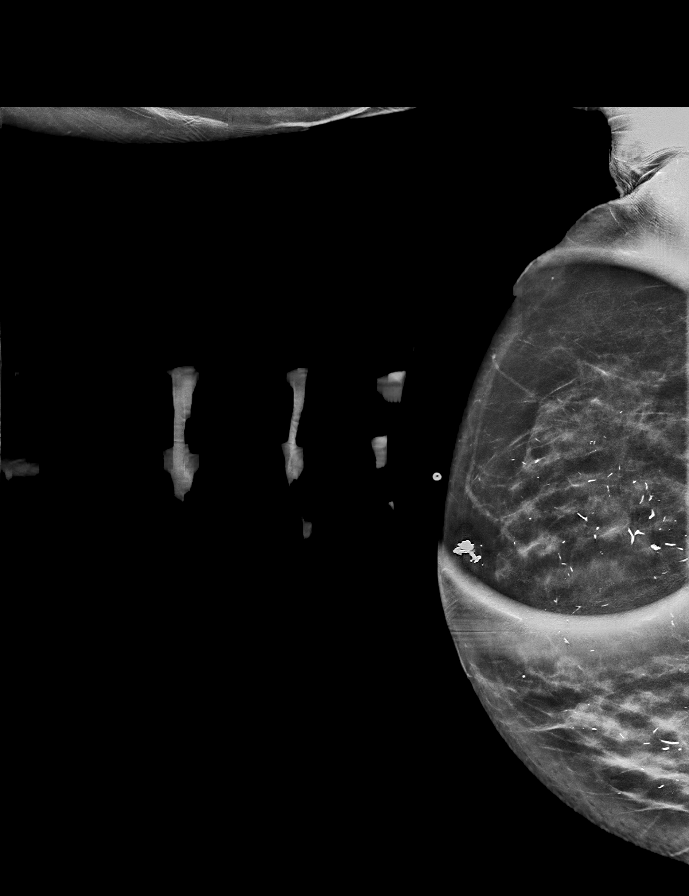

[R CC synth-2D (2 of 2)]
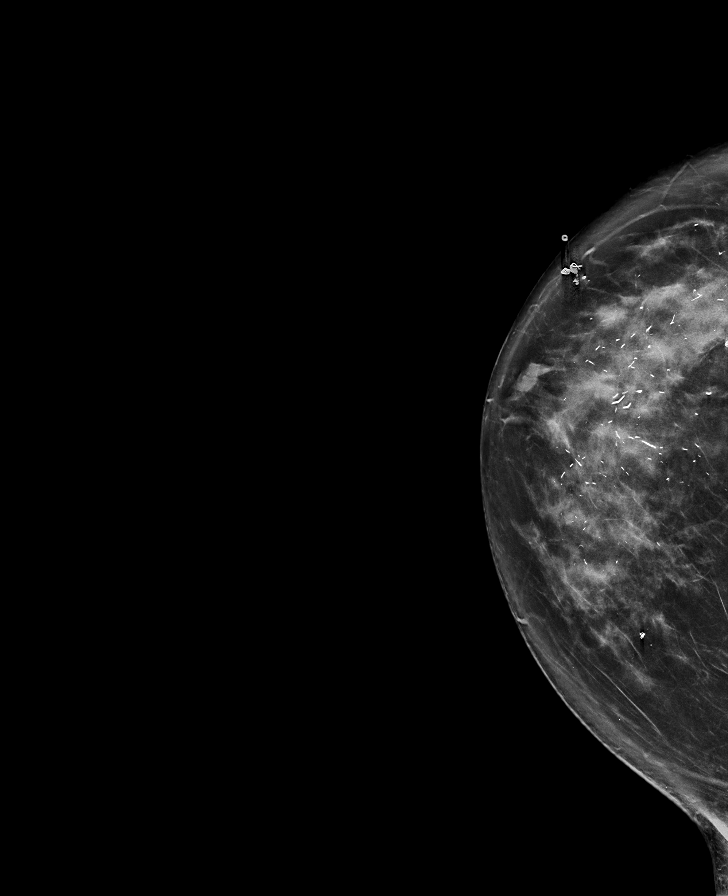

[L TAN synth-2D]
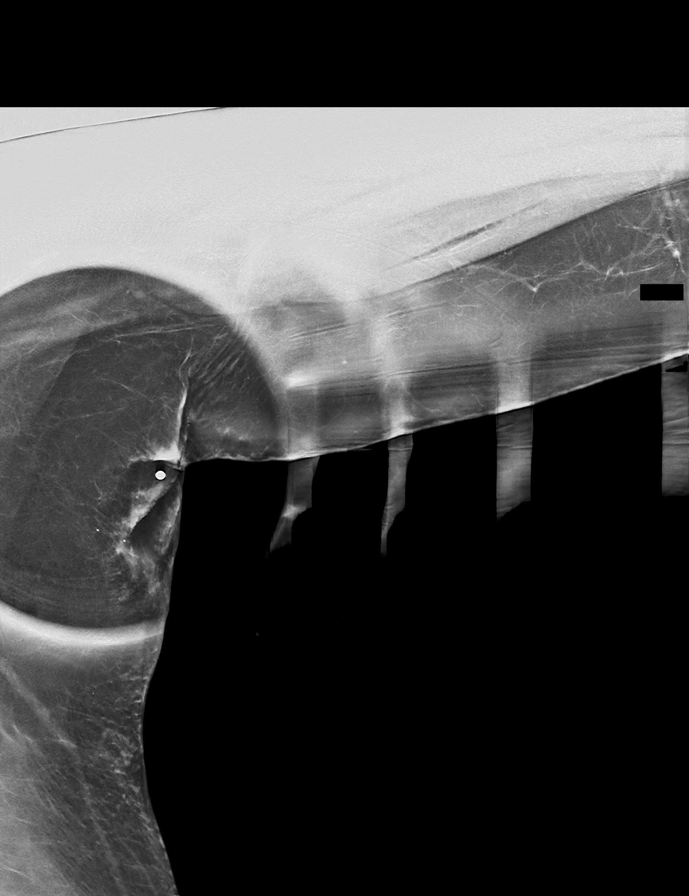

[R MLO synth-2D]
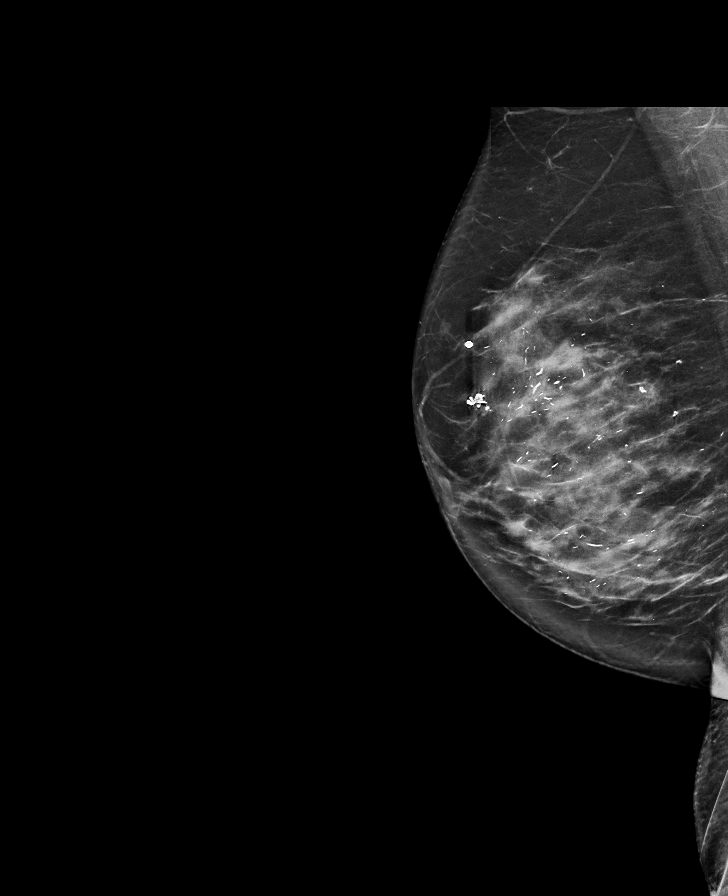

[L CC synth-2D]
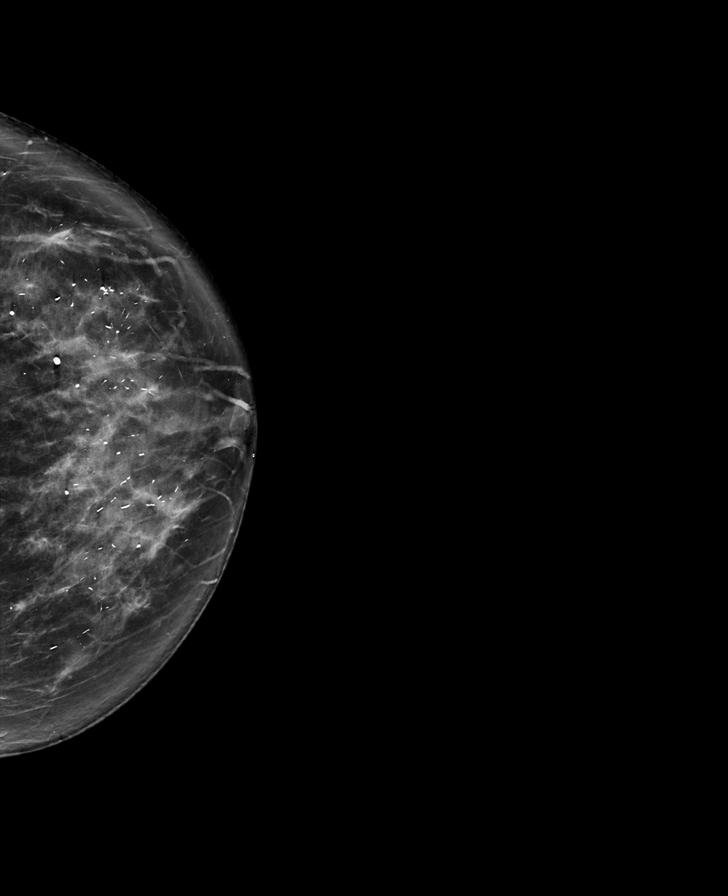

[R CC tomo · tomo slice 41/81.0]
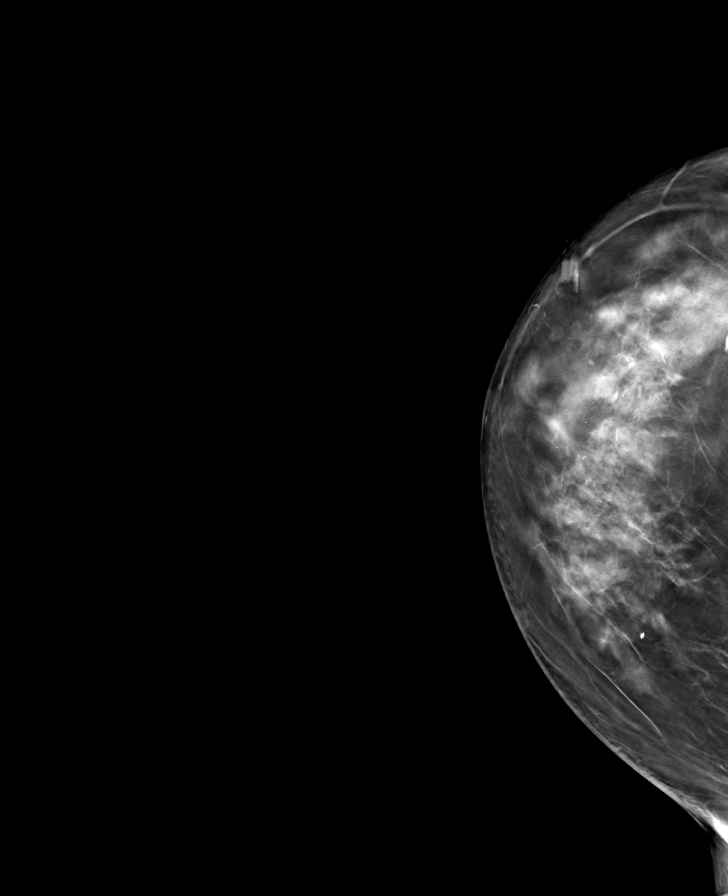

[8 of 40 positions shown; findings below may reference images not displayed]

ACR Breast Density Category c: The breast tissue is heterogeneously
dense, which may obscure small masses.
FINDINGS: Mammogram:

Right breast: Full field and spot compression tomosynthesis views
were performed. A BB marks the palpable site of concern in the outer
right breast. At the palpable site there is a mass measuring
approximately 0.7 cm containing a biopsy marking clip (biopsy-proven
benign fibroadenoma). There are benign popcorn calcifications within
the mass. There are no new additional findings in the right breast.

Left breast: Full field tomosynthesis and spot compression views of
the left axilla were performed. No suspicious mass, distortion, or
microcalcifications are identified to suggest presence of
malignancy. No abnormality is identified on the spot views of the
left axilla at the patient's palpable site of concern.

Mammographic images were processed with CAD.

Ultrasound:

Targeted ultrasound is performed in the left axilla at the palpable
site of concern demonstrating no suspicious cystic or solid mass. No
enlarged axillary lymph node. Again noted is a small lesion within
the skin measuring up to 0.5 cm, which is decreased in size on the
prior ultrasound from 3488 and consistent with a sebaceous cyst.
IMPRESSION: 1. No mammographic or sonographic evidence of malignancy at the
palpable site of concern in the left axilla.

2. At the palpable site reported by the patient in the right breast
there is a small mass which was previously biopsied and demonstrated
to be a benign fibroadenoma. Benign popcorn calcification has
developed within the mass over time likely representing the hardness
felt by the patient.

RECOMMENDATION:
1.  Screening mammogram in one year.(Code:[id number])

2. Breast cancer risk assessment. If patient meets high risk status,
consider screening breast MRI.

I have discussed the findings and recommendations with the patient.
If applicable, a reminder letter will be sent to the patient
regarding the next appointment.

BI-RADS CATEGORY  2: Benign.

## 2021-11-25 DIAGNOSIS — M79671 Pain in right foot: Secondary | ICD-10-CM | POA: Diagnosis not present

## 2021-11-25 DIAGNOSIS — R269 Unspecified abnormalities of gait and mobility: Secondary | ICD-10-CM | POA: Diagnosis not present

## 2021-11-25 DIAGNOSIS — M545 Low back pain, unspecified: Secondary | ICD-10-CM | POA: Diagnosis not present

## 2021-11-28 DIAGNOSIS — R269 Unspecified abnormalities of gait and mobility: Secondary | ICD-10-CM | POA: Diagnosis not present

## 2021-11-28 DIAGNOSIS — M545 Low back pain, unspecified: Secondary | ICD-10-CM | POA: Diagnosis not present

## 2021-11-28 DIAGNOSIS — M79671 Pain in right foot: Secondary | ICD-10-CM | POA: Diagnosis not present

## 2021-11-29 ENCOUNTER — Other Ambulatory Visit: Payer: Medicare PPO | Admitting: *Deleted

## 2021-11-29 DIAGNOSIS — E7800 Pure hypercholesterolemia, unspecified: Secondary | ICD-10-CM | POA: Diagnosis not present

## 2021-11-30 DIAGNOSIS — M545 Low back pain, unspecified: Secondary | ICD-10-CM | POA: Diagnosis not present

## 2021-11-30 DIAGNOSIS — M79671 Pain in right foot: Secondary | ICD-10-CM | POA: Diagnosis not present

## 2021-11-30 DIAGNOSIS — R269 Unspecified abnormalities of gait and mobility: Secondary | ICD-10-CM | POA: Diagnosis not present

## 2021-11-30 LAB — LIPID PANEL
Chol/HDL Ratio: 3.5 ratio (ref 0.0–4.4)
Cholesterol, Total: 159 mg/dL (ref 100–199)
HDL: 45 mg/dL (ref >39)
LDL Chol Calc (NIH): 82 mg/dL (ref 0–99)
Triglycerides: 186 mg/dL — ABNORMAL HIGH (ref 0–149)
VLDL Cholesterol Cal: 32 mg/dL (ref 5–40)

## 2021-12-01 ENCOUNTER — Telehealth: Payer: Self-pay | Admitting: Cardiology

## 2021-12-01 DIAGNOSIS — E7800 Pure hypercholesterolemia, unspecified: Secondary | ICD-10-CM

## 2021-12-01 MED ORDER — ATORVASTATIN CALCIUM 80 MG PO TABS: 80.0000 mg | ORAL_TABLET | Freq: Every day | ORAL | 3 refills | Status: DC

## 2021-12-01 NOTE — Telephone Encounter (Signed)
Patient is returning call to discuss lab results. 

## 2021-12-01 NOTE — Telephone Encounter (Signed)
The patient has been notified of the result and verbalized understanding.  All questions (if any) were answered. ?Antonieta Iba, RN 12/01/2021 3:44 PM  ?Patient will try to increase her atorvastatin to 80 mg daily. Rx has been sent in. She will repeat labs in 6 weeks. Advised to call us if she cannot tolerate the higher dose. She reports that she has also gotten back into an exercise routine and is hoping that will help to lower her cholesterol as well.  ?

## 2021-12-01 NOTE — Telephone Encounter (Signed)
-----   Message from [name], MD sent at 11/30/2021  9:29 AM EDT ----- ?LDL not at goal - please find out if she thinks she can go up on Atorvastatin to '80mg'$  daily and if yes get FLP and ALT in 6 weeks ?

## 2021-12-02 DIAGNOSIS — M545 Low back pain, unspecified: Secondary | ICD-10-CM | POA: Diagnosis not present

## 2021-12-02 DIAGNOSIS — M79671 Pain in right foot: Secondary | ICD-10-CM | POA: Diagnosis not present

## 2021-12-02 DIAGNOSIS — R269 Unspecified abnormalities of gait and mobility: Secondary | ICD-10-CM | POA: Diagnosis not present

## 2021-12-05 DIAGNOSIS — M545 Low back pain, unspecified: Secondary | ICD-10-CM | POA: Diagnosis not present

## 2021-12-05 DIAGNOSIS — M79671 Pain in right foot: Secondary | ICD-10-CM | POA: Diagnosis not present

## 2021-12-05 DIAGNOSIS — R269 Unspecified abnormalities of gait and mobility: Secondary | ICD-10-CM | POA: Diagnosis not present

## 2021-12-07 DIAGNOSIS — F341 Dysthymic disorder: Secondary | ICD-10-CM | POA: Diagnosis not present

## 2021-12-09 DIAGNOSIS — M545 Low back pain, unspecified: Secondary | ICD-10-CM | POA: Diagnosis not present

## 2021-12-09 DIAGNOSIS — R269 Unspecified abnormalities of gait and mobility: Secondary | ICD-10-CM | POA: Diagnosis not present

## 2021-12-09 DIAGNOSIS — M79671 Pain in right foot: Secondary | ICD-10-CM | POA: Diagnosis not present

## 2021-12-12 DIAGNOSIS — R269 Unspecified abnormalities of gait and mobility: Secondary | ICD-10-CM | POA: Diagnosis not present

## 2021-12-12 DIAGNOSIS — M545 Low back pain, unspecified: Secondary | ICD-10-CM | POA: Diagnosis not present

## 2021-12-12 DIAGNOSIS — M79671 Pain in right foot: Secondary | ICD-10-CM | POA: Diagnosis not present

## 2021-12-14 DIAGNOSIS — F422 Mixed obsessional thoughts and acts: Secondary | ICD-10-CM | POA: Diagnosis not present

## 2021-12-14 DIAGNOSIS — G4700 Insomnia, unspecified: Secondary | ICD-10-CM | POA: Diagnosis not present

## 2021-12-14 DIAGNOSIS — F411 Generalized anxiety disorder: Secondary | ICD-10-CM | POA: Diagnosis not present

## 2021-12-14 DIAGNOSIS — F3341 Major depressive disorder, recurrent, in partial remission: Secondary | ICD-10-CM | POA: Diagnosis not present

## 2021-12-15 DIAGNOSIS — R269 Unspecified abnormalities of gait and mobility: Secondary | ICD-10-CM | POA: Diagnosis not present

## 2021-12-15 DIAGNOSIS — M545 Low back pain, unspecified: Secondary | ICD-10-CM | POA: Diagnosis not present

## 2021-12-15 DIAGNOSIS — M79671 Pain in right foot: Secondary | ICD-10-CM | POA: Diagnosis not present

## 2021-12-16 DIAGNOSIS — M545 Low back pain, unspecified: Secondary | ICD-10-CM | POA: Diagnosis not present

## 2021-12-16 DIAGNOSIS — R269 Unspecified abnormalities of gait and mobility: Secondary | ICD-10-CM | POA: Diagnosis not present

## 2021-12-16 DIAGNOSIS — M79671 Pain in right foot: Secondary | ICD-10-CM | POA: Diagnosis not present

## 2021-12-20 DIAGNOSIS — M545 Low back pain, unspecified: Secondary | ICD-10-CM | POA: Diagnosis not present

## 2021-12-20 DIAGNOSIS — M79671 Pain in right foot: Secondary | ICD-10-CM | POA: Diagnosis not present

## 2021-12-20 DIAGNOSIS — R269 Unspecified abnormalities of gait and mobility: Secondary | ICD-10-CM | POA: Diagnosis not present

## 2021-12-22 DIAGNOSIS — M545 Low back pain, unspecified: Secondary | ICD-10-CM | POA: Diagnosis not present

## 2021-12-22 DIAGNOSIS — M79671 Pain in right foot: Secondary | ICD-10-CM | POA: Diagnosis not present

## 2021-12-22 DIAGNOSIS — R269 Unspecified abnormalities of gait and mobility: Secondary | ICD-10-CM | POA: Diagnosis not present

## 2021-12-28 DIAGNOSIS — M79671 Pain in right foot: Secondary | ICD-10-CM | POA: Diagnosis not present

## 2021-12-28 DIAGNOSIS — F341 Dysthymic disorder: Secondary | ICD-10-CM | POA: Diagnosis not present

## 2021-12-28 DIAGNOSIS — R269 Unspecified abnormalities of gait and mobility: Secondary | ICD-10-CM | POA: Diagnosis not present

## 2021-12-28 DIAGNOSIS — M545 Low back pain, unspecified: Secondary | ICD-10-CM | POA: Diagnosis not present

## 2021-12-29 DIAGNOSIS — M545 Low back pain, unspecified: Secondary | ICD-10-CM | POA: Diagnosis not present

## 2021-12-29 DIAGNOSIS — M79671 Pain in right foot: Secondary | ICD-10-CM | POA: Diagnosis not present

## 2021-12-29 DIAGNOSIS — R269 Unspecified abnormalities of gait and mobility: Secondary | ICD-10-CM | POA: Diagnosis not present

## 2021-12-30 DIAGNOSIS — R269 Unspecified abnormalities of gait and mobility: Secondary | ICD-10-CM | POA: Diagnosis not present

## 2021-12-30 DIAGNOSIS — M79671 Pain in right foot: Secondary | ICD-10-CM | POA: Diagnosis not present

## 2021-12-30 DIAGNOSIS — M545 Low back pain, unspecified: Secondary | ICD-10-CM | POA: Diagnosis not present

## 2022-01-03 DIAGNOSIS — R269 Unspecified abnormalities of gait and mobility: Secondary | ICD-10-CM | POA: Diagnosis not present

## 2022-01-03 DIAGNOSIS — M79671 Pain in right foot: Secondary | ICD-10-CM | POA: Diagnosis not present

## 2022-01-03 DIAGNOSIS — M545 Low back pain, unspecified: Secondary | ICD-10-CM | POA: Diagnosis not present

## 2022-01-05 DIAGNOSIS — M545 Low back pain, unspecified: Secondary | ICD-10-CM | POA: Diagnosis not present

## 2022-01-05 DIAGNOSIS — R269 Unspecified abnormalities of gait and mobility: Secondary | ICD-10-CM | POA: Diagnosis not present

## 2022-01-05 DIAGNOSIS — M79671 Pain in right foot: Secondary | ICD-10-CM | POA: Diagnosis not present

## 2022-01-09 ENCOUNTER — Other Ambulatory Visit: Payer: Medicare PPO

## 2022-01-09 DIAGNOSIS — E7800 Pure hypercholesterolemia, unspecified: Secondary | ICD-10-CM

## 2022-01-09 LAB — LIPID PANEL
Chol/HDL Ratio: 2.6 ratio (ref 0.0–4.4)
Cholesterol, Total: 108 mg/dL (ref 100–199)
HDL: 42 mg/dL (ref >39)
LDL Chol Calc (NIH): 45 mg/dL (ref 0–99)
Triglycerides: 116 mg/dL (ref 0–149)
VLDL Cholesterol Cal: 21 mg/dL (ref 5–40)

## 2022-01-09 LAB — ALT: ALT: 26 IU/L (ref 0–32)

## 2022-01-10 ENCOUNTER — Telehealth: Payer: Self-pay | Admitting: Cardiology

## 2022-01-10 NOTE — Telephone Encounter (Signed)
Pt c/o medication issue:  1. Name of Medication: atorvastatin (LIPITOR) 80 MG tablet  2. How are you currently taking this medication (dosage and times per day)? 80 mg 1x daily  3. Are you having a reaction (difficulty breathing--STAT)? No  4. What is your medication issue? Pt is requesting advice on if she should drop this medication back down to 40 mg or stay at 80 mg since her blood work came back okay. Please advise.

## 2022-01-10 NOTE — Telephone Encounter (Signed)
Pt is inquiring if she should continue her current dose of lipitor or decrease this back to 40 mg po daily, being her lipids came back with improvement.   Will forward this message to Dr. Radford Pax to further review and advise on lab results/med management.  Triage to follow-up with the pt accordingly thereafter.

## 2022-01-10 NOTE — Telephone Encounter (Signed)
Jeanenne, Licea - 01/10/2022 11:09 AM [name], MD  Sent: Tue January 10, 2022 11:36 AM  To: Nuala Alpha, LPN  Cc: [facility] Triage          Message  Needs to stay on current dose of statin    The patient has been notified of the result and verbalized understanding.  All questions (if any) were answered. Nuala Alpha, LPN 4/53/6468 03:21 AM

## 2022-01-11 DIAGNOSIS — R269 Unspecified abnormalities of gait and mobility: Secondary | ICD-10-CM | POA: Diagnosis not present

## 2022-01-11 DIAGNOSIS — M79671 Pain in right foot: Secondary | ICD-10-CM | POA: Diagnosis not present

## 2022-01-11 DIAGNOSIS — M545 Low back pain, unspecified: Secondary | ICD-10-CM | POA: Diagnosis not present

## 2022-01-16 DIAGNOSIS — M79671 Pain in right foot: Secondary | ICD-10-CM | POA: Diagnosis not present

## 2022-01-16 DIAGNOSIS — M545 Low back pain, unspecified: Secondary | ICD-10-CM | POA: Diagnosis not present

## 2022-01-16 DIAGNOSIS — R269 Unspecified abnormalities of gait and mobility: Secondary | ICD-10-CM | POA: Diagnosis not present

## 2022-01-18 DIAGNOSIS — R269 Unspecified abnormalities of gait and mobility: Secondary | ICD-10-CM | POA: Diagnosis not present

## 2022-01-18 DIAGNOSIS — M79671 Pain in right foot: Secondary | ICD-10-CM | POA: Diagnosis not present

## 2022-01-18 DIAGNOSIS — M545 Low back pain, unspecified: Secondary | ICD-10-CM | POA: Diagnosis not present

## 2022-01-20 DIAGNOSIS — M79671 Pain in right foot: Secondary | ICD-10-CM | POA: Diagnosis not present

## 2022-01-20 DIAGNOSIS — R269 Unspecified abnormalities of gait and mobility: Secondary | ICD-10-CM | POA: Diagnosis not present

## 2022-01-20 DIAGNOSIS — M545 Low back pain, unspecified: Secondary | ICD-10-CM | POA: Diagnosis not present

## 2022-01-23 DIAGNOSIS — M79671 Pain in right foot: Secondary | ICD-10-CM | POA: Diagnosis not present

## 2022-01-23 DIAGNOSIS — M545 Low back pain, unspecified: Secondary | ICD-10-CM | POA: Diagnosis not present

## 2022-01-23 DIAGNOSIS — R269 Unspecified abnormalities of gait and mobility: Secondary | ICD-10-CM | POA: Diagnosis not present

## 2022-01-24 DIAGNOSIS — F341 Dysthymic disorder: Secondary | ICD-10-CM | POA: Diagnosis not present

## 2022-01-25 DIAGNOSIS — M545 Low back pain, unspecified: Secondary | ICD-10-CM | POA: Diagnosis not present

## 2022-01-25 DIAGNOSIS — R269 Unspecified abnormalities of gait and mobility: Secondary | ICD-10-CM | POA: Diagnosis not present

## 2022-01-25 DIAGNOSIS — M79671 Pain in right foot: Secondary | ICD-10-CM | POA: Diagnosis not present

## 2022-01-30 DIAGNOSIS — R109 Unspecified abdominal pain: Secondary | ICD-10-CM | POA: Diagnosis not present

## 2022-01-30 DIAGNOSIS — R269 Unspecified abnormalities of gait and mobility: Secondary | ICD-10-CM | POA: Diagnosis not present

## 2022-01-30 DIAGNOSIS — M545 Low back pain, unspecified: Secondary | ICD-10-CM | POA: Diagnosis not present

## 2022-01-30 DIAGNOSIS — M79671 Pain in right foot: Secondary | ICD-10-CM | POA: Diagnosis not present

## 2022-02-02 DIAGNOSIS — R269 Unspecified abnormalities of gait and mobility: Secondary | ICD-10-CM | POA: Diagnosis not present

## 2022-02-02 DIAGNOSIS — M79671 Pain in right foot: Secondary | ICD-10-CM | POA: Diagnosis not present

## 2022-02-02 DIAGNOSIS — M545 Low back pain, unspecified: Secondary | ICD-10-CM | POA: Diagnosis not present

## 2022-02-03 DIAGNOSIS — R269 Unspecified abnormalities of gait and mobility: Secondary | ICD-10-CM | POA: Diagnosis not present

## 2022-02-03 DIAGNOSIS — M79671 Pain in right foot: Secondary | ICD-10-CM | POA: Diagnosis not present

## 2022-02-03 DIAGNOSIS — M545 Low back pain, unspecified: Secondary | ICD-10-CM | POA: Diagnosis not present

## 2022-02-03 DIAGNOSIS — M542 Cervicalgia: Secondary | ICD-10-CM | POA: Diagnosis not present

## 2022-02-05 ENCOUNTER — Other Ambulatory Visit: Payer: Self-pay | Admitting: Gastroenterology

## 2022-02-05 ENCOUNTER — Other Ambulatory Visit: Payer: Self-pay | Admitting: Cardiology

## 2022-02-06 DIAGNOSIS — M79671 Pain in right foot: Secondary | ICD-10-CM | POA: Diagnosis not present

## 2022-02-06 DIAGNOSIS — R269 Unspecified abnormalities of gait and mobility: Secondary | ICD-10-CM | POA: Diagnosis not present

## 2022-02-06 DIAGNOSIS — M545 Low back pain, unspecified: Secondary | ICD-10-CM | POA: Diagnosis not present

## 2022-02-07 DIAGNOSIS — E119 Type 2 diabetes mellitus without complications: Secondary | ICD-10-CM | POA: Diagnosis not present

## 2022-02-07 DIAGNOSIS — H5213 Myopia, bilateral: Secondary | ICD-10-CM | POA: Diagnosis not present

## 2022-02-10 DIAGNOSIS — M79671 Pain in right foot: Secondary | ICD-10-CM | POA: Diagnosis not present

## 2022-02-10 DIAGNOSIS — M545 Low back pain, unspecified: Secondary | ICD-10-CM | POA: Diagnosis not present

## 2022-02-10 DIAGNOSIS — R269 Unspecified abnormalities of gait and mobility: Secondary | ICD-10-CM | POA: Diagnosis not present

## 2022-02-14 DIAGNOSIS — M545 Low back pain, unspecified: Secondary | ICD-10-CM | POA: Diagnosis not present

## 2022-02-14 DIAGNOSIS — M79671 Pain in right foot: Secondary | ICD-10-CM | POA: Diagnosis not present

## 2022-02-14 DIAGNOSIS — R269 Unspecified abnormalities of gait and mobility: Secondary | ICD-10-CM | POA: Diagnosis not present

## 2022-02-15 ENCOUNTER — Other Ambulatory Visit: Payer: Self-pay | Admitting: Family Medicine

## 2022-02-15 DIAGNOSIS — N200 Calculus of kidney: Secondary | ICD-10-CM | POA: Diagnosis not present

## 2022-02-15 DIAGNOSIS — Z1231 Encounter for screening mammogram for malignant neoplasm of breast: Secondary | ICD-10-CM

## 2022-02-17 DIAGNOSIS — R269 Unspecified abnormalities of gait and mobility: Secondary | ICD-10-CM | POA: Diagnosis not present

## 2022-02-17 DIAGNOSIS — M79671 Pain in right foot: Secondary | ICD-10-CM | POA: Diagnosis not present

## 2022-02-17 DIAGNOSIS — M545 Low back pain, unspecified: Secondary | ICD-10-CM | POA: Diagnosis not present

## 2022-02-21 DIAGNOSIS — M545 Low back pain, unspecified: Secondary | ICD-10-CM | POA: Diagnosis not present

## 2022-02-21 DIAGNOSIS — R269 Unspecified abnormalities of gait and mobility: Secondary | ICD-10-CM | POA: Diagnosis not present

## 2022-02-21 DIAGNOSIS — M79671 Pain in right foot: Secondary | ICD-10-CM | POA: Diagnosis not present

## 2022-02-23 DIAGNOSIS — M545 Low back pain, unspecified: Secondary | ICD-10-CM | POA: Diagnosis not present

## 2022-02-23 DIAGNOSIS — I10 Essential (primary) hypertension: Secondary | ICD-10-CM | POA: Diagnosis not present

## 2022-02-23 DIAGNOSIS — M79671 Pain in right foot: Secondary | ICD-10-CM | POA: Diagnosis not present

## 2022-02-23 DIAGNOSIS — E1149 Type 2 diabetes mellitus with other diabetic neurological complication: Secondary | ICD-10-CM | POA: Diagnosis not present

## 2022-02-23 DIAGNOSIS — R269 Unspecified abnormalities of gait and mobility: Secondary | ICD-10-CM | POA: Diagnosis not present

## 2022-02-23 DIAGNOSIS — K219 Gastro-esophageal reflux disease without esophagitis: Secondary | ICD-10-CM | POA: Diagnosis not present

## 2022-02-23 DIAGNOSIS — E785 Hyperlipidemia, unspecified: Secondary | ICD-10-CM | POA: Diagnosis not present

## 2022-02-27 DIAGNOSIS — F341 Dysthymic disorder: Secondary | ICD-10-CM | POA: Diagnosis not present

## 2022-03-01 DIAGNOSIS — M79671 Pain in right foot: Secondary | ICD-10-CM | POA: Diagnosis not present

## 2022-03-01 DIAGNOSIS — R269 Unspecified abnormalities of gait and mobility: Secondary | ICD-10-CM | POA: Diagnosis not present

## 2022-03-01 DIAGNOSIS — M545 Low back pain, unspecified: Secondary | ICD-10-CM | POA: Diagnosis not present

## 2022-03-03 DIAGNOSIS — M79671 Pain in right foot: Secondary | ICD-10-CM | POA: Diagnosis not present

## 2022-03-03 DIAGNOSIS — M545 Low back pain, unspecified: Secondary | ICD-10-CM | POA: Diagnosis not present

## 2022-03-03 DIAGNOSIS — R269 Unspecified abnormalities of gait and mobility: Secondary | ICD-10-CM | POA: Diagnosis not present

## 2022-03-07 DIAGNOSIS — R269 Unspecified abnormalities of gait and mobility: Secondary | ICD-10-CM | POA: Diagnosis not present

## 2022-03-07 DIAGNOSIS — M79671 Pain in right foot: Secondary | ICD-10-CM | POA: Diagnosis not present

## 2022-03-07 DIAGNOSIS — M545 Low back pain, unspecified: Secondary | ICD-10-CM | POA: Diagnosis not present

## 2022-03-08 ENCOUNTER — Ambulatory Visit
Admission: RE | Admit: 2022-03-08 | Discharge: 2022-03-08 | Disposition: A | Discharge status: Home / Self Care | Payer: Medicare PPO | Source: Ambulatory Visit | Attending: Family Medicine | Admitting: Family Medicine

## 2022-03-08 ENCOUNTER — Encounter (INDEPENDENT_AMBULATORY_CARE_PROVIDER_SITE_OTHER): Payer: Self-pay

## 2022-03-08 DIAGNOSIS — Z1231 Encounter for screening mammogram for malignant neoplasm of breast: Secondary | ICD-10-CM | POA: Diagnosis not present

## 2022-03-10 DIAGNOSIS — M79671 Pain in right foot: Secondary | ICD-10-CM | POA: Diagnosis not present

## 2022-03-10 DIAGNOSIS — R269 Unspecified abnormalities of gait and mobility: Secondary | ICD-10-CM | POA: Diagnosis not present

## 2022-03-10 DIAGNOSIS — M545 Low back pain, unspecified: Secondary | ICD-10-CM | POA: Diagnosis not present

## 2022-03-13 DIAGNOSIS — R269 Unspecified abnormalities of gait and mobility: Secondary | ICD-10-CM | POA: Diagnosis not present

## 2022-03-13 DIAGNOSIS — M545 Low back pain, unspecified: Secondary | ICD-10-CM | POA: Diagnosis not present

## 2022-03-13 DIAGNOSIS — M79671 Pain in right foot: Secondary | ICD-10-CM | POA: Diagnosis not present

## 2022-03-14 DIAGNOSIS — G4700 Insomnia, unspecified: Secondary | ICD-10-CM | POA: Diagnosis not present

## 2022-03-14 DIAGNOSIS — F411 Generalized anxiety disorder: Secondary | ICD-10-CM | POA: Diagnosis not present

## 2022-03-14 DIAGNOSIS — F3341 Major depressive disorder, recurrent, in partial remission: Secondary | ICD-10-CM | POA: Diagnosis not present

## 2022-03-14 DIAGNOSIS — F422 Mixed obsessional thoughts and acts: Secondary | ICD-10-CM | POA: Diagnosis not present

## 2022-03-17 DIAGNOSIS — M79671 Pain in right foot: Secondary | ICD-10-CM | POA: Diagnosis not present

## 2022-03-17 DIAGNOSIS — M545 Low back pain, unspecified: Secondary | ICD-10-CM | POA: Diagnosis not present

## 2022-03-17 DIAGNOSIS — R269 Unspecified abnormalities of gait and mobility: Secondary | ICD-10-CM | POA: Diagnosis not present

## 2022-03-22 DIAGNOSIS — M79671 Pain in right foot: Secondary | ICD-10-CM | POA: Diagnosis not present

## 2022-03-22 DIAGNOSIS — R269 Unspecified abnormalities of gait and mobility: Secondary | ICD-10-CM | POA: Diagnosis not present

## 2022-03-22 DIAGNOSIS — M545 Low back pain, unspecified: Secondary | ICD-10-CM | POA: Diagnosis not present

## 2022-03-24 DIAGNOSIS — M545 Low back pain, unspecified: Secondary | ICD-10-CM | POA: Diagnosis not present

## 2022-03-24 DIAGNOSIS — R269 Unspecified abnormalities of gait and mobility: Secondary | ICD-10-CM | POA: Diagnosis not present

## 2022-03-24 DIAGNOSIS — M79671 Pain in right foot: Secondary | ICD-10-CM | POA: Diagnosis not present

## 2022-03-27 DIAGNOSIS — R269 Unspecified abnormalities of gait and mobility: Secondary | ICD-10-CM | POA: Diagnosis not present

## 2022-03-27 DIAGNOSIS — M79671 Pain in right foot: Secondary | ICD-10-CM | POA: Diagnosis not present

## 2022-03-27 DIAGNOSIS — M545 Low back pain, unspecified: Secondary | ICD-10-CM | POA: Diagnosis not present

## 2022-03-31 DIAGNOSIS — M79671 Pain in right foot: Secondary | ICD-10-CM | POA: Diagnosis not present

## 2022-03-31 DIAGNOSIS — M545 Low back pain, unspecified: Secondary | ICD-10-CM | POA: Diagnosis not present

## 2022-03-31 DIAGNOSIS — R269 Unspecified abnormalities of gait and mobility: Secondary | ICD-10-CM | POA: Diagnosis not present

## 2022-04-04 DIAGNOSIS — M25551 Pain in right hip: Secondary | ICD-10-CM | POA: Diagnosis not present

## 2022-04-05 DIAGNOSIS — M79671 Pain in right foot: Secondary | ICD-10-CM | POA: Diagnosis not present

## 2022-04-05 DIAGNOSIS — R269 Unspecified abnormalities of gait and mobility: Secondary | ICD-10-CM | POA: Diagnosis not present

## 2022-04-05 DIAGNOSIS — M545 Low back pain, unspecified: Secondary | ICD-10-CM | POA: Diagnosis not present

## 2022-04-07 DIAGNOSIS — M79671 Pain in right foot: Secondary | ICD-10-CM | POA: Diagnosis not present

## 2022-04-07 DIAGNOSIS — M545 Low back pain, unspecified: Secondary | ICD-10-CM | POA: Diagnosis not present

## 2022-04-07 DIAGNOSIS — R269 Unspecified abnormalities of gait and mobility: Secondary | ICD-10-CM | POA: Diagnosis not present

## 2022-04-12 DIAGNOSIS — M1711 Unilateral primary osteoarthritis, right knee: Secondary | ICD-10-CM | POA: Diagnosis not present

## 2022-04-12 DIAGNOSIS — M25551 Pain in right hip: Secondary | ICD-10-CM | POA: Diagnosis not present

## 2022-04-13 DIAGNOSIS — R269 Unspecified abnormalities of gait and mobility: Secondary | ICD-10-CM | POA: Diagnosis not present

## 2022-04-13 DIAGNOSIS — M79671 Pain in right foot: Secondary | ICD-10-CM | POA: Diagnosis not present

## 2022-04-13 DIAGNOSIS — M545 Low back pain, unspecified: Secondary | ICD-10-CM | POA: Diagnosis not present

## 2022-04-17 DIAGNOSIS — M545 Low back pain, unspecified: Secondary | ICD-10-CM | POA: Diagnosis not present

## 2022-04-17 DIAGNOSIS — R269 Unspecified abnormalities of gait and mobility: Secondary | ICD-10-CM | POA: Diagnosis not present

## 2022-04-17 DIAGNOSIS — M79671 Pain in right foot: Secondary | ICD-10-CM | POA: Diagnosis not present

## 2022-04-18 DIAGNOSIS — M25551 Pain in right hip: Secondary | ICD-10-CM | POA: Diagnosis not present

## 2022-04-21 DIAGNOSIS — R269 Unspecified abnormalities of gait and mobility: Secondary | ICD-10-CM | POA: Diagnosis not present

## 2022-04-21 DIAGNOSIS — M79671 Pain in right foot: Secondary | ICD-10-CM | POA: Diagnosis not present

## 2022-04-21 DIAGNOSIS — M545 Low back pain, unspecified: Secondary | ICD-10-CM | POA: Diagnosis not present

## 2022-04-24 DIAGNOSIS — M79671 Pain in right foot: Secondary | ICD-10-CM | POA: Diagnosis not present

## 2022-04-24 DIAGNOSIS — M545 Low back pain, unspecified: Secondary | ICD-10-CM | POA: Diagnosis not present

## 2022-04-24 DIAGNOSIS — R269 Unspecified abnormalities of gait and mobility: Secondary | ICD-10-CM | POA: Diagnosis not present

## 2022-04-26 DIAGNOSIS — R269 Unspecified abnormalities of gait and mobility: Secondary | ICD-10-CM | POA: Diagnosis not present

## 2022-04-26 DIAGNOSIS — M545 Low back pain, unspecified: Secondary | ICD-10-CM | POA: Diagnosis not present

## 2022-04-26 DIAGNOSIS — M79671 Pain in right foot: Secondary | ICD-10-CM | POA: Diagnosis not present

## 2022-05-03 DIAGNOSIS — R269 Unspecified abnormalities of gait and mobility: Secondary | ICD-10-CM | POA: Diagnosis not present

## 2022-05-03 DIAGNOSIS — Z6834 Body mass index (BMI) 34.0-34.9, adult: Secondary | ICD-10-CM | POA: Diagnosis not present

## 2022-05-03 DIAGNOSIS — Z1389 Encounter for screening for other disorder: Secondary | ICD-10-CM | POA: Diagnosis not present

## 2022-05-03 DIAGNOSIS — K5900 Constipation, unspecified: Secondary | ICD-10-CM | POA: Diagnosis not present

## 2022-05-03 DIAGNOSIS — Z23 Encounter for immunization: Secondary | ICD-10-CM | POA: Diagnosis not present

## 2022-05-03 DIAGNOSIS — M545 Low back pain, unspecified: Secondary | ICD-10-CM | POA: Diagnosis not present

## 2022-05-03 DIAGNOSIS — Z0000 Encounter for general adult medical examination without abnormal findings: Secondary | ICD-10-CM | POA: Diagnosis not present

## 2022-05-03 DIAGNOSIS — M79671 Pain in right foot: Secondary | ICD-10-CM | POA: Diagnosis not present

## 2022-05-03 DIAGNOSIS — E1169 Type 2 diabetes mellitus with other specified complication: Secondary | ICD-10-CM | POA: Diagnosis not present

## 2022-05-03 DIAGNOSIS — I10 Essential (primary) hypertension: Secondary | ICD-10-CM | POA: Diagnosis not present

## 2022-05-05 DIAGNOSIS — M79671 Pain in right foot: Secondary | ICD-10-CM | POA: Diagnosis not present

## 2022-05-05 DIAGNOSIS — R269 Unspecified abnormalities of gait and mobility: Secondary | ICD-10-CM | POA: Diagnosis not present

## 2022-05-05 DIAGNOSIS — M545 Low back pain, unspecified: Secondary | ICD-10-CM | POA: Diagnosis not present

## 2022-05-08 DIAGNOSIS — M545 Low back pain, unspecified: Secondary | ICD-10-CM | POA: Diagnosis not present

## 2022-05-08 DIAGNOSIS — M79671 Pain in right foot: Secondary | ICD-10-CM | POA: Diagnosis not present

## 2022-05-08 DIAGNOSIS — R269 Unspecified abnormalities of gait and mobility: Secondary | ICD-10-CM | POA: Diagnosis not present

## 2022-05-09 DIAGNOSIS — M25511 Pain in right shoulder: Secondary | ICD-10-CM | POA: Diagnosis not present

## 2022-05-09 DIAGNOSIS — M25512 Pain in left shoulder: Secondary | ICD-10-CM | POA: Diagnosis not present

## 2022-05-09 DIAGNOSIS — M7541 Impingement syndrome of right shoulder: Secondary | ICD-10-CM | POA: Diagnosis not present

## 2022-05-11 ENCOUNTER — Other Ambulatory Visit: Payer: Self-pay | Admitting: Cardiology

## 2022-05-12 DIAGNOSIS — M545 Low back pain, unspecified: Secondary | ICD-10-CM | POA: Diagnosis not present

## 2022-05-12 DIAGNOSIS — R269 Unspecified abnormalities of gait and mobility: Secondary | ICD-10-CM | POA: Diagnosis not present

## 2022-05-12 DIAGNOSIS — M79671 Pain in right foot: Secondary | ICD-10-CM | POA: Diagnosis not present

## 2022-05-17 DIAGNOSIS — F341 Dysthymic disorder: Secondary | ICD-10-CM | POA: Diagnosis not present

## 2022-05-17 DIAGNOSIS — R269 Unspecified abnormalities of gait and mobility: Secondary | ICD-10-CM | POA: Diagnosis not present

## 2022-05-17 DIAGNOSIS — M545 Low back pain, unspecified: Secondary | ICD-10-CM | POA: Diagnosis not present

## 2022-05-17 DIAGNOSIS — M79671 Pain in right foot: Secondary | ICD-10-CM | POA: Diagnosis not present

## 2022-05-19 DIAGNOSIS — R269 Unspecified abnormalities of gait and mobility: Secondary | ICD-10-CM | POA: Diagnosis not present

## 2022-05-19 DIAGNOSIS — M545 Low back pain, unspecified: Secondary | ICD-10-CM | POA: Diagnosis not present

## 2022-05-19 DIAGNOSIS — M79671 Pain in right foot: Secondary | ICD-10-CM | POA: Diagnosis not present

## 2022-05-22 DIAGNOSIS — M79671 Pain in right foot: Secondary | ICD-10-CM | POA: Diagnosis not present

## 2022-05-22 DIAGNOSIS — M545 Low back pain, unspecified: Secondary | ICD-10-CM | POA: Diagnosis not present

## 2022-05-22 DIAGNOSIS — R269 Unspecified abnormalities of gait and mobility: Secondary | ICD-10-CM | POA: Diagnosis not present

## 2022-05-26 DIAGNOSIS — M79671 Pain in right foot: Secondary | ICD-10-CM | POA: Diagnosis not present

## 2022-05-26 DIAGNOSIS — M545 Low back pain, unspecified: Secondary | ICD-10-CM | POA: Diagnosis not present

## 2022-05-26 DIAGNOSIS — R269 Unspecified abnormalities of gait and mobility: Secondary | ICD-10-CM | POA: Diagnosis not present

## 2022-05-29 DIAGNOSIS — R269 Unspecified abnormalities of gait and mobility: Secondary | ICD-10-CM | POA: Diagnosis not present

## 2022-05-29 DIAGNOSIS — M79671 Pain in right foot: Secondary | ICD-10-CM | POA: Diagnosis not present

## 2022-05-29 DIAGNOSIS — M545 Low back pain, unspecified: Secondary | ICD-10-CM | POA: Diagnosis not present

## 2022-05-31 NOTE — Progress Notes (Deleted)
Date:  05/31/2022   ID:  [name], DOB 10-25-1950, MRN [id number]   PCP:  Kathyrn Lass, MD   Chief Complaint:  OSA/CAD/HTN/Obesity  History of Present Illness:    [name] is a 71 y.o. female  with a hx of nonobstructive ASCAD with 20% OM, HTN, dyslipidemia, vasovagal syncope and OSA on CPAP.    SHe is here today for followup and is doing well.  She denies any chest pain or pressure, SOB, DOE, PND, orthopnea, LE edema, dizziness, palpitations or syncope. She is compliant with her meds and is tolerating meds with no SE.    She is doing well with her PAP device and thinks that she has gotten used to it.  She tolerates the mask and feels the pressure is adequate.  Since going on PAP she feels rested in the am and has no significant daytime sleepiness.  She denies any significant mouth or nasal dryness or nasal congestion.  She does not think that he snores.    Prior CV studies:   The following studies were reviewed today:  PAP compliance download  Past Medical History:  Diagnosis Date   Arthritis    Atherosclerosis    Back pain    Cancer ([place]) 2000   hx of skin cancer , basal cell   Chronic fatigue    and weakness   Colon polyps    Constipation    Coronary artery disease    nonobstructive with 20% OM by cath 2006   Depression    Diabetes mellitus without complication ([place])    type 2    Diastolic dysfunction    w Elevated LVEDP   DJD (degenerative joint disease)    C3/4 Dr Sherwood Gambler   Edema, lower extremity    GERD (gastroesophageal reflux disease)    High cholesterol    History of kidney stones    Hypertension    Hypoglycemia    Kidney stones    Multiple thyroid nodules    Neurocardiogenic syncope    Obesity    Osteoarthritis    PONV (postoperative nausea and vomiting)    Prediabetes 2011 & 2012   Right knee pain 12/2009   Dr Marlou Sa   Sleep apnea    CPAP had Sleep Study ordered by Dr. Radford Pax    Tick bite 11/2016   Vasovagal syncope    Past  Surgical History:  Procedure Laterality Date   ACHILLES TENDON SURGERY Left 01/23/2013   Procedure: EXCISION PARTIAL BONE TALUS/CALCANEUS, REPAIR RUPTURE ACHILLES TENDON PRIMARY OPEN ;  Surgeon: Ninetta Lights, MD;  Location: [facility];  Service: Orthopedics;  Laterality: Left;   ANTERIOR FUSION CERVICAL SPINE  2000, 2002   x2-cervical   BREAST BIOPSY Right 2007   BREAST CYST EXCISION Left 2007   CARDIAC CATHETERIZATION  ~ 2008   Nonobstructive ASCAD, 20% OM   CARPAL TUNNEL RELEASE Left 2002   COLONOSCOPY     CYSTOSCOPY W/ URETERAL STENT PLACEMENT Left 12/23/2019   Procedure: CYSTOSCOPY WITH RETROGRADE PYELOGRAM/URETERAL STENT PLACEMENT;  Surgeon: Ardis Hughs, MD;  Location: [facility];  Service: Urology;  Laterality: Left;   CYSTOSCOPY/URETEROSCOPY/HOLMIUM LASER/STENT PLACEMENT Left 01/05/2020   Procedure: CYSTOSCOPY LEFT URETEROSCOPY/HOLMIUM LASER/STENT EXCHANGE;  Surgeon: [name], MD;  Location: [facility];  Service: Urology;  Laterality: Left;   EXTRACORPOREAL SHOCK WAVE LITHOTRIPSY Left 12/22/2019   Procedure: LEFT EXTRACORPOREAL SHOCK WAVE LITHOTRIPSY (ESWL);  Surgeon: Ardis Hughs, MD;  [facility];  Service: Urology;  Laterality: Left;   LITHOTRIPSY     OTHER SURGICAL HISTORY  06/12/2018   Left big toe nail surgery. [facility]    TONSILLECTOMY     TOTAL KNEE ARTHROPLASTY Right 11/2010   TOTAL KNEE ARTHROPLASTY  12/13/2011   Procedure: TOTAL KNEE ARTHROPLASTY;  Surgeon: Ninetta Lights, MD;  Location: [place];  Service: Orthopedics;  Laterality: Left;  DR MURPHY WANTS 57 MINUTES FOR THIS CASE   UPPER GASTROINTESTINAL ENDOSCOPY       No outpatient medications have been marked as taking for the 06/01/22 encounter (Appointment) with [name], MD.     Allergies:   Guaifenesin & derivatives, Morphine and related, Penicillins, Tramadol, Hydrocodone-acetaminophen, and Oxycodone hcl   Social History   Tobacco Use    Smoking status: Never    Passive exposure: Past (Mother)   Smokeless tobacco: Never  Vaping Use   Vaping Use: Never used  Substance Use Topics   Alcohol use: Yes    Alcohol/week: 6.0 standard drinks of alcohol    Types: 6 Glasses of wine per week    Comment: couple times per week.   Drug use: Never     Family Hx: The patient's family history includes Breast cancer in her sister; CAD in her father; CVA in her mother; Cancer in her paternal aunt; Cancer - Prostate in her father; Colon cancer in an other family member; Crohn's disease in her paternal aunt; Depression in her mother; Diverticulosis in her father, mother, and sister; Heart disease in her father and paternal grandfather; Hypertension in her father, mother, and sister; Kidney disease in her mother; Sudden death in her father. There is no history of Esophageal cancer, Stomach cancer, or Rectal cancer.  ROS:   Please see the history of present illness.     All other systems reviewed and are negative.   Labs/Other Tests and Data Reviewed:    Recent Labs: 01/09/2022: ALT 26   Recent Lipid Panel Lab Results  Component Value Date/Time   CHOL 108 01/09/2022 12:40 PM   TRIG 116 01/09/2022 12:40 PM   HDL 42 01/09/2022 12:40 PM   CHOLHDL 2.6 01/09/2022 12:40 PM   CHOLHDL 3.9 08/05/2015 11:49 AM   LDLCALC 45 01/09/2022 12:40 PM   LDLDIRECT 93.3 09/17/2013 01:07 PM    Wt Readings from Last 3 Encounters:  06/27/21 226 lb (102.5 kg)  03/25/21 222 lb (100.7 kg)  02/28/21 222 lb (100.7 kg)     Objective:    Vital Signs:  There were no vitals taken for this visit.   GEN: Well nourished, well developed in no acute distress HEENT: Normal NECK: No JVD; No carotid bruits LYMPHATICS: No lymphadenopathy CARDIAC:RRR, no murmurs, rubs, gallops RESPIRATORY:  Clear to auscultation without rales, wheezing or rhonchi  ABDOMEN: Soft, non-tender, non-distended MUSCULOSKELETAL:  No edema; No deformity  SKIN: Warm and dry NEUROLOGIC:   Alert and oriented x 3 PSYCHIATRIC:  Normal affect  ASSESSMENT & PLAN:    1.  OSA - The patient is tolerating PAP therapy well without any problems. The PAP download performed by his DME was personally reviewed and interpreted by me today and showed an AHI of 0.6/hr on 11 cm H2O with 67% compliance in using more than 4 hours nightly.  The patient has been using and benefiting from PAP use and will continue to benefit from therapy.     2.  Hypertension  -BP controlled on exam today -Continue prescription drug management with lisinopril 10 mg daily  with as needed refills -I have personally reviewed and interpreted outside labs performed by patient's PCP which showed ***  4.  Nonobstructive ASCAD  -cath in 2006 showed 20% OM.   -She has chronic dyspnea on exertion when walking up inclines but otherwise no new shortness of breath or chest pain. -Lexiscan myoview 12/2018 showed no ischemia -continue ASA and statin   5.   Hyperlipidemia  - her LDL goal is < 70.  - I have personally reviewed and interpreted outside labs performed by patient's PCP which showed *** - Continue prescription drug management with atorvastatin 80 mg daily and Zetia 10 mg daily with as needed refills  6.  Syncope -occurred in setting of severe back pain after a lithotripsy and then went to go to the bathroom and got nauseated and then passed out.  She got up and went to the bed and then passed out again. She was admitted and given IVF and found to have a kidney hematoma.  She then had ureteral stent.   -She has not had any further dizziness or syncope.  -sx c/w vasovagal syncope -2D echo 01/2020 showed normal LVF with moderate to severe septal hypertrophy with no obstruction and mild MR -cMRI showed normal LVF with EF 58%, mild BSH, mild LAE and RAE, mild MR/TR and no HOCM    Medication Adjustments/Labs and Tests Ordered: Current medicines are reviewed at length with the patient today.  Concerns regarding medicines  are outlined above.  Tests Ordered: No orders of the defined types were placed in this encounter.   Medication Changes: No orders of the defined types were placed in this encounter.    Disposition:  Follow up in 1 year(s)  Signed, Fransico Him, MD  05/31/2022 9:34 PM    [place]

## 2022-06-01 ENCOUNTER — Ambulatory Visit: Payer: Medicare PPO | Admitting: Cardiology

## 2022-06-01 DIAGNOSIS — I2510 Atherosclerotic heart disease of native coronary artery without angina pectoris: Secondary | ICD-10-CM

## 2022-06-01 DIAGNOSIS — E7800 Pure hypercholesterolemia, unspecified: Secondary | ICD-10-CM

## 2022-06-01 DIAGNOSIS — G4733 Obstructive sleep apnea (adult) (pediatric): Secondary | ICD-10-CM

## 2022-06-01 DIAGNOSIS — I10 Essential (primary) hypertension: Secondary | ICD-10-CM

## 2022-06-01 DIAGNOSIS — R55 Syncope and collapse: Secondary | ICD-10-CM

## 2022-06-02 DIAGNOSIS — R269 Unspecified abnormalities of gait and mobility: Secondary | ICD-10-CM | POA: Diagnosis not present

## 2022-06-02 DIAGNOSIS — M7661 Achilles tendinitis, right leg: Secondary | ICD-10-CM | POA: Diagnosis not present

## 2022-06-02 DIAGNOSIS — M79671 Pain in right foot: Secondary | ICD-10-CM | POA: Diagnosis not present

## 2022-06-02 DIAGNOSIS — M7662 Achilles tendinitis, left leg: Secondary | ICD-10-CM | POA: Diagnosis not present

## 2022-06-02 DIAGNOSIS — M545 Low back pain, unspecified: Secondary | ICD-10-CM | POA: Diagnosis not present

## 2022-06-05 DIAGNOSIS — M79671 Pain in right foot: Secondary | ICD-10-CM | POA: Diagnosis not present

## 2022-06-05 DIAGNOSIS — R269 Unspecified abnormalities of gait and mobility: Secondary | ICD-10-CM | POA: Diagnosis not present

## 2022-06-05 DIAGNOSIS — M545 Low back pain, unspecified: Secondary | ICD-10-CM | POA: Diagnosis not present

## 2022-06-05 DIAGNOSIS — F341 Dysthymic disorder: Secondary | ICD-10-CM | POA: Diagnosis not present

## 2022-06-08 ENCOUNTER — Ambulatory Visit: Payer: Medicare PPO | Attending: Cardiology | Admitting: Cardiology

## 2022-06-08 ENCOUNTER — Encounter: Payer: Self-pay | Admitting: Cardiology

## 2022-06-08 VITALS — BP 124/78 | HR 68 | Wt 220.6 lb

## 2022-06-08 DIAGNOSIS — I2583 Coronary atherosclerosis due to lipid rich plaque: Secondary | ICD-10-CM

## 2022-06-08 DIAGNOSIS — R55 Syncope and collapse: Secondary | ICD-10-CM | POA: Diagnosis not present

## 2022-06-08 DIAGNOSIS — G4733 Obstructive sleep apnea (adult) (pediatric): Secondary | ICD-10-CM

## 2022-06-08 DIAGNOSIS — I2510 Atherosclerotic heart disease of native coronary artery without angina pectoris: Secondary | ICD-10-CM

## 2022-06-08 DIAGNOSIS — E7800 Pure hypercholesterolemia, unspecified: Secondary | ICD-10-CM

## 2022-06-08 DIAGNOSIS — I10 Essential (primary) hypertension: Secondary | ICD-10-CM | POA: Diagnosis not present

## 2022-06-08 NOTE — Patient Instructions (Signed)
Medication Instructions:  Your physician recommends that you continue on your current medications as directed. Please refer to the Current Medication list given to you today.  *If you need a refill on your cardiac medications before your next appointment, please call your pharmacy*  Dr. Radford Pax has ordered you a new ResMed CPAP at [facility]: At [facility], you and your health needs are our priority.  As part of our continuing mission to provide you with exceptional heart care, we have created designated Provider Care Teams.  These Care Teams include your primary Cardiologist (physician) and Advanced Practice Providers (APPs -  Physician Assistants and Nurse Practitioners) who all work together to provide you with the care you need, when you need it.  Your next appointment:   6 weeks after you receive your new device  Important Information About [place]

## 2022-06-08 NOTE — Progress Notes (Signed)
Date:  06/08/2022   ID:  [name], DOB 10/21/1950, MRN [id number]   PCP:  Kathyrn Lass, MD   Chief Complaint:  OSA/CAD/HTN/Obesity  History of Present Illness:    [name] is a 71 y.o. female  with a hx of nonobstructive ASCAD with 20% OM, HTN, dyslipidemia, vasovagal syncope and OSA on CPAP.    She is doing well with her PAP device and thinks that she has gotten used to it.  She tolerates the mask and feels the pressure is adequate.  Since going on PAP she feels rested in the am and has no significant daytime sleepiness.  She denies any significant mouth or nasal dryness or nasal congestion.  She does not think that she snores.  She says that her device is making a lot of noise and she thinks she needs a new one as her machine is 72 years old.   She is here today for followup and is doing well.  She denies any chest pain or pressure, SOB, DOE, PND, orthopnea or syncope.  She has chronic LE edema which is stable.  She gets some dizziness when she has been laying down and then sits up but no syncope. She is compliant with her meds and is tolerating meds with no SE.    Prior CV studies:   The following studies were reviewed today:  PAP compliance download  Past Medical History:  Diagnosis Date   Arthritis    Atherosclerosis    Back pain    Cancer ([place]) 2000   hx of skin cancer , basal cell   Chronic fatigue    and weakness   Colon polyps    Constipation    Coronary artery disease    nonobstructive with 20% OM by cath 2006   Depression    Diabetes mellitus without complication ([place])    type 2    Diastolic dysfunction    w Elevated LVEDP   DJD (degenerative joint disease)    C3/4 Dr Sherwood Gambler   Edema, lower extremity    GERD (gastroesophageal reflux disease)    High cholesterol    History of kidney stones    Hypertension    Hypoglycemia    Kidney stones    Multiple thyroid nodules    Neurocardiogenic syncope    Obesity    Osteoarthritis    PONV  (postoperative nausea and vomiting)    Prediabetes 2011 & 2012   Right knee pain 12/2009   Dr Marlou Sa   Sleep apnea    CPAP had Sleep Study ordered by Dr. Radford Pax    Tick bite 11/2016   Vasovagal syncope    Past Surgical History:  Procedure Laterality Date   ACHILLES TENDON SURGERY Left 01/23/2013   Procedure: EXCISION PARTIAL BONE TALUS/CALCANEUS, REPAIR RUPTURE ACHILLES TENDON PRIMARY OPEN ;  Surgeon: Ninetta Lights, MD;  Location: [place];  Service: Orthopedics;  Laterality: Left;   ANTERIOR FUSION CERVICAL SPINE  2000, 2002   x2-cervical   BREAST BIOPSY Right 2007   BREAST CYST EXCISION Left 2007   CARDIAC CATHETERIZATION  ~ 2008   Nonobstructive ASCAD, 20% OM   CARPAL TUNNEL RELEASE Left 2002   COLONOSCOPY     CYSTOSCOPY W/ URETERAL STENT PLACEMENT Left 12/23/2019   Procedure: CYSTOSCOPY WITH RETROGRADE PYELOGRAM/URETERAL STENT PLACEMENT;  Surgeon: Ardis Hughs, MD;  [facility];  Service: Urology;  Laterality: Left;   CYSTOSCOPY/URETEROSCOPY/HOLMIUM LASER/STENT PLACEMENT Left 01/05/2020   Procedure: CYSTOSCOPY LEFT URETEROSCOPY/HOLMIUM  LASER/STENT EXCHANGE;  Surgeon: [name], MD;  Location[facility];  Service: Urology;  Laterality: Left;   EXTRACORPOREAL SHOCK WAVE LITHOTRIPSY Left 12/22/2019   Procedure: LEFT EXTRACORPOREAL SHOCK WAVE LITHOTRIPSY (ESWL);  Surgeon: Ardis Hughs, MD;  Location: [facility];  Service: Urology;  Laterality: Left;   LITHOTRIPSY     OTHER SURGICAL HISTORY  06/12/2018   Left big toe nail surgery. [facility]    TONSILLECTOMY     TOTAL KNEE ARTHROPLASTY Right 11/2010   TOTAL KNEE ARTHROPLASTY  12/13/2011   Procedure: TOTAL KNEE ARTHROPLASTY;  Surgeon: Ninetta Lights, MD;  Location: [place];  Service: Orthopedics;  Laterality: Left;  DR MURPHY WANTS 90 MINUTES FOR THIS CASE   UPPER GASTROINTESTINAL ENDOSCOPY       Current Meds  Medication Sig   ammonium lactate (LAC-HYDRIN) 12 % cream  Apply topically as needed for dry skin.   aspirin 81 MG tablet Take 81 mg by mouth daily.   atorvastatin (LIPITOR) 80 MG tablet Take 1 tablet (80 mg total) by mouth daily.   calcium carbonate (OSCAL) 1500 (600 Ca) MG TABS tablet Take 600 mg of elemental calcium by mouth daily with breakfast.   Cholecalciferol (VITAMIN D3 PO) Take 2,000 Units by mouth daily.   clonazePAM (KLONOPIN) 0.5 MG tablet Take 0.5 mg by mouth daily.    desvenlafaxine (PRISTIQ) 50 MG 24 hr tablet Take 25 mg by mouth daily.    ergocalciferol (VITAMIN D2) 1.25 MG (50000 UT) capsule Take by mouth.   esomeprazole (NEXIUM) 20 MG capsule Take 1 capsule (20 mg total) by mouth daily. Please schedule a yearly follow up for further refills. Thank you.   ezetimibe (ZETIA) 10 MG tablet Take 1 tablet (10 mg total) by mouth daily. Pt needs to make appt with provider for further refills - 2nd attempt   FERROUS SULFATE PO Take 65 mg by mouth daily.   ibuprofen (ADVIL,MOTRIN) 200 MG tablet Take 400 mg by mouth as needed (pain).    lisinopril (ZESTRIL) 10 MG tablet Take 1 tablet (10 mg total) by mouth every morning.   metFORMIN (GLUCOPHAGE) 500 MG tablet Take 500 mg by mouth 2 (two) times daily with a meal.    Multiple Vitamins-Minerals (PRESERVISION AREDS 2 PO) Take 1 capsule by mouth daily.   nitroGLYCERIN (NITRODUR - DOSED IN MG/24 HR) 0.1 mg/hr patch Place 0.1 mg onto the skin daily. Back of right heel to aid with circulation.   NONFORMULARY OR COMPOUNDED ITEM Apply topically 2 (two) times daily. Salicylic Acid 16%, Urea 10% Cream, apply to the affected area once or twice daily   Omega-3 Fatty Acids (FISH OIL) 1200 MG CAPS Take 3 capsules by mouth daily.   traZODone (DESYREL) 50 MG tablet Take 150 mg by mouth at bedtime.   vitamin B-12 (CYANOCOBALAMIN) 1000 MCG tablet 1 tablet     Allergies:   Guaifenesin & derivatives, Morphine and related, Penicillins, Tramadol, Hydrocodone-acetaminophen, and Oxycodone hcl   Social History    Tobacco Use   Smoking status: Never    Passive exposure: Past (Mother)   Smokeless tobacco: Never  Vaping Use   Vaping Use: Never used  Substance Use Topics   Alcohol use: Yes    Alcohol/week: 6.0 standard drinks of alcohol    Types: 6 Glasses of wine per week    Comment: couple times per week.   Drug use: Never     Family Hx: The patient's family history includes Breast cancer in  her sister; CAD in her father; CVA in her mother; Cancer in her paternal aunt; Cancer - Prostate in her father; Colon cancer in an other family member; Crohn's disease in her paternal aunt; Depression in her mother; Diverticulosis in her father, mother, and sister; Heart disease in her father and paternal grandfather; Hypertension in her father, mother, and sister; Kidney disease in her mother; Sudden death in her father. There is no history of Esophageal cancer, Stomach cancer, or Rectal cancer.  ROS:   Please see the history of present illness.     All other systems reviewed and are negative.   Labs/Other Tests and Data Reviewed:    Recent Labs: 01/09/2022: ALT 26   Recent Lipid Panel Lab Results  Component Value Date/Time   CHOL 108 01/09/2022 12:40 PM   TRIG 116 01/09/2022 12:40 PM   HDL 42 01/09/2022 12:40 PM   CHOLHDL 2.6 01/09/2022 12:40 PM   CHOLHDL 3.9 08/05/2015 11:49 AM   LDLCALC 45 01/09/2022 12:40 PM   LDLDIRECT 93.3 09/17/2013 01:07 PM    Wt Readings from Last 3 Encounters:  06/08/22 220 lb 9.6 oz (100.1 kg)  06/27/21 226 lb (102.5 kg)  03/25/21 222 lb (100.7 kg)     Objective:    Vital Signs:  BP 124/78   Pulse 68   Wt 220 lb 9.6 oz (100.1 kg)   SpO2 98%   BMI 36.15 kg/m    GEN: Well nourished, well developed in no acute distress HEENT: Normal NECK: No JVD; No carotid bruits LYMPHATICS: No lymphadenopathy CARDIAC:RRR, no murmurs, rubs, gallops RESPIRATORY:  Clear to auscultation without rales, wheezing or rhonchi  ABDOMEN: Soft, non-tender,  non-distended MUSCULOSKELETAL:  No edema; No deformity  SKIN: Warm and dry NEUROLOGIC:  Alert and oriented x 3 PSYCHIATRIC:  Normal affect   EKG performed today showed NSR with anterior infarct and no ST changes  ASSESSMENT & PLAN:    1.  OSA - The patient is tolerating PAP therapy well without any problems. The PAP download performed by his DME was personally reviewed and interpreted by me today and showed an AHI of 0.4/hr on 11 cm H2O with 60 % compliance in using more than 4 hours nightly.  The patient has been using and benefiting from PAP use and will continue to benefit from therapy.  -encouraged her to put her mask back on at night if she notices that it comes off -I will order her a new ResMed CPAP at 11cm H2O with  heated humidity as her device is making noises and is old -she will see me back 6 weeks after she gets her new device    2.  Hypertension  -BP is adequately controlled on exam today -Continue prescription drug managed with lisinopril 10 mg daily with as needed refills-I have personally reviewed and interpreted outside labs performed by patient's PCP which showed serum creatinine 0.92 and potassium 4.3 on 10/10/2021  4.  Nonobstructive ASCAD  -cath in 2006 showed 20% OM.   -She has chronic shortness of breath with Inclines but otherwise she has not had any shortness of breath or chest pain since I saw her last -Leane Call 12/2018 showed no ischemia -continue ASA and statin   5.   Hyperlipidemia  - her LDL goal is < 70.  - I have personally reviewed and interpreted outside labs performed by patient's PCP which showed LDL 45 and HDL 42 on 01/09/2022 and a normal ALT at 26 -Continue prescription management with atorvastatin 80  mg daily and Zetia 10 mg daily with as needed refills  6.  Syncope -occurred in setting of severe back pain after a lithotripsy and then went to go to the bathroom and got nauseated and then passed out.  She got up and went to the bed and then  passed out again. She was admitted and given IVF and found to have a kidney hematoma.  She then had ureteral stent.   -She denies any further  syncope -sx c/w vasovagal syncope -2D echo 01/2020 showed normal LVF with moderate to severe septal hypertrophy with no obstruction and mild MR -cMRI showed normal LVF with EF 58%, mild BSH, mild LAE and RAE, mild MR/TR and no HOCM    Medication Adjustments/Labs and Tests Ordered: Current medicines are reviewed at length with the patient today.  Concerns regarding medicines are outlined above.  Tests Ordered: Orders Placed This Encounter  Procedures   EKG 12-Lead    Medication Changes: No orders of the defined types were placed in this encounter.    Disposition:  Follow up in 1 year(s)  Signed, Fransico Him, MD  06/08/2022 3:47 PM    [place]

## 2022-06-09 DIAGNOSIS — M545 Low back pain, unspecified: Secondary | ICD-10-CM | POA: Diagnosis not present

## 2022-06-09 DIAGNOSIS — R269 Unspecified abnormalities of gait and mobility: Secondary | ICD-10-CM | POA: Diagnosis not present

## 2022-06-09 DIAGNOSIS — M79671 Pain in right foot: Secondary | ICD-10-CM | POA: Diagnosis not present

## 2022-06-12 ENCOUNTER — Telehealth: Payer: Self-pay | Admitting: *Deleted

## 2022-06-12 DIAGNOSIS — G4733 Obstructive sleep apnea (adult) (pediatric): Secondary | ICD-10-CM

## 2022-06-12 DIAGNOSIS — M545 Low back pain, unspecified: Secondary | ICD-10-CM | POA: Diagnosis not present

## 2022-06-12 DIAGNOSIS — R269 Unspecified abnormalities of gait and mobility: Secondary | ICD-10-CM | POA: Diagnosis not present

## 2022-06-12 DIAGNOSIS — M79671 Pain in right foot: Secondary | ICD-10-CM | POA: Diagnosis not present

## 2022-06-12 NOTE — Telephone Encounter (Signed)
Order placed to [facility] via community message. 

## 2022-06-12 NOTE — Telephone Encounter (Signed)
-----   Message from Bernestine Amass, RN sent at 06/08/2022  3:58 PM EST ----- Per Dr. Radford Pax: Order new ResMed CPAP at 11cm H2O with heated humidity -she will see me back 6 weeks after she gets her new device Thanks! [place]

## 2022-06-16 DIAGNOSIS — M79671 Pain in right foot: Secondary | ICD-10-CM | POA: Diagnosis not present

## 2022-06-16 DIAGNOSIS — R269 Unspecified abnormalities of gait and mobility: Secondary | ICD-10-CM | POA: Diagnosis not present

## 2022-06-16 DIAGNOSIS — M545 Low back pain, unspecified: Secondary | ICD-10-CM | POA: Diagnosis not present

## 2022-06-20 DIAGNOSIS — M79671 Pain in right foot: Secondary | ICD-10-CM | POA: Diagnosis not present

## 2022-06-20 DIAGNOSIS — M545 Low back pain, unspecified: Secondary | ICD-10-CM | POA: Diagnosis not present

## 2022-06-20 DIAGNOSIS — R269 Unspecified abnormalities of gait and mobility: Secondary | ICD-10-CM | POA: Diagnosis not present

## 2022-06-26 DIAGNOSIS — M545 Low back pain, unspecified: Secondary | ICD-10-CM | POA: Diagnosis not present

## 2022-06-26 DIAGNOSIS — M79671 Pain in right foot: Secondary | ICD-10-CM | POA: Diagnosis not present

## 2022-06-26 DIAGNOSIS — R269 Unspecified abnormalities of gait and mobility: Secondary | ICD-10-CM | POA: Diagnosis not present

## 2022-06-30 DIAGNOSIS — R269 Unspecified abnormalities of gait and mobility: Secondary | ICD-10-CM | POA: Diagnosis not present

## 2022-06-30 DIAGNOSIS — M545 Low back pain, unspecified: Secondary | ICD-10-CM | POA: Diagnosis not present

## 2022-06-30 DIAGNOSIS — M79671 Pain in right foot: Secondary | ICD-10-CM | POA: Diagnosis not present

## 2022-07-04 DIAGNOSIS — R269 Unspecified abnormalities of gait and mobility: Secondary | ICD-10-CM | POA: Diagnosis not present

## 2022-07-04 DIAGNOSIS — M79671 Pain in right foot: Secondary | ICD-10-CM | POA: Diagnosis not present

## 2022-07-04 DIAGNOSIS — M545 Low back pain, unspecified: Secondary | ICD-10-CM | POA: Diagnosis not present

## 2022-07-07 DIAGNOSIS — R269 Unspecified abnormalities of gait and mobility: Secondary | ICD-10-CM | POA: Diagnosis not present

## 2022-07-07 DIAGNOSIS — M545 Low back pain, unspecified: Secondary | ICD-10-CM | POA: Diagnosis not present

## 2022-07-07 DIAGNOSIS — M79671 Pain in right foot: Secondary | ICD-10-CM | POA: Diagnosis not present

## 2022-07-10 DIAGNOSIS — F341 Dysthymic disorder: Secondary | ICD-10-CM | POA: Diagnosis not present

## 2022-07-10 DIAGNOSIS — R269 Unspecified abnormalities of gait and mobility: Secondary | ICD-10-CM | POA: Diagnosis not present

## 2022-07-10 DIAGNOSIS — M545 Low back pain, unspecified: Secondary | ICD-10-CM | POA: Diagnosis not present

## 2022-07-10 DIAGNOSIS — M79671 Pain in right foot: Secondary | ICD-10-CM | POA: Diagnosis not present

## 2022-07-13 DIAGNOSIS — M545 Low back pain, unspecified: Secondary | ICD-10-CM | POA: Diagnosis not present

## 2022-07-13 DIAGNOSIS — R269 Unspecified abnormalities of gait and mobility: Secondary | ICD-10-CM | POA: Diagnosis not present

## 2022-07-13 DIAGNOSIS — M79671 Pain in right foot: Secondary | ICD-10-CM | POA: Diagnosis not present

## 2022-07-21 DIAGNOSIS — M545 Low back pain, unspecified: Secondary | ICD-10-CM | POA: Diagnosis not present

## 2022-07-21 DIAGNOSIS — R269 Unspecified abnormalities of gait and mobility: Secondary | ICD-10-CM | POA: Diagnosis not present

## 2022-07-21 DIAGNOSIS — M79671 Pain in right foot: Secondary | ICD-10-CM | POA: Diagnosis not present

## 2022-07-26 DIAGNOSIS — M545 Low back pain, unspecified: Secondary | ICD-10-CM | POA: Diagnosis not present

## 2022-07-26 DIAGNOSIS — M79671 Pain in right foot: Secondary | ICD-10-CM | POA: Diagnosis not present

## 2022-07-26 DIAGNOSIS — R269 Unspecified abnormalities of gait and mobility: Secondary | ICD-10-CM | POA: Diagnosis not present

## 2022-07-28 DIAGNOSIS — M545 Low back pain, unspecified: Secondary | ICD-10-CM | POA: Diagnosis not present

## 2022-07-28 DIAGNOSIS — R269 Unspecified abnormalities of gait and mobility: Secondary | ICD-10-CM | POA: Diagnosis not present

## 2022-07-28 DIAGNOSIS — M79671 Pain in right foot: Secondary | ICD-10-CM | POA: Diagnosis not present

## 2022-08-01 DIAGNOSIS — M545 Low back pain, unspecified: Secondary | ICD-10-CM | POA: Diagnosis not present

## 2022-08-01 DIAGNOSIS — R269 Unspecified abnormalities of gait and mobility: Secondary | ICD-10-CM | POA: Diagnosis not present

## 2022-08-01 DIAGNOSIS — M79671 Pain in right foot: Secondary | ICD-10-CM | POA: Diagnosis not present

## 2022-08-03 DIAGNOSIS — R269 Unspecified abnormalities of gait and mobility: Secondary | ICD-10-CM | POA: Diagnosis not present

## 2022-08-03 DIAGNOSIS — L299 Pruritus, unspecified: Secondary | ICD-10-CM | POA: Diagnosis not present

## 2022-08-03 DIAGNOSIS — R202 Paresthesia of skin: Secondary | ICD-10-CM | POA: Diagnosis not present

## 2022-08-03 DIAGNOSIS — M545 Low back pain, unspecified: Secondary | ICD-10-CM | POA: Diagnosis not present

## 2022-08-03 DIAGNOSIS — M79671 Pain in right foot: Secondary | ICD-10-CM | POA: Diagnosis not present

## 2022-08-03 DIAGNOSIS — Z6835 Body mass index (BMI) 35.0-35.9, adult: Secondary | ICD-10-CM | POA: Diagnosis not present

## 2022-08-03 DIAGNOSIS — E1169 Type 2 diabetes mellitus with other specified complication: Secondary | ICD-10-CM | POA: Diagnosis not present

## 2022-08-08 DIAGNOSIS — R269 Unspecified abnormalities of gait and mobility: Secondary | ICD-10-CM | POA: Diagnosis not present

## 2022-08-08 DIAGNOSIS — M545 Low back pain, unspecified: Secondary | ICD-10-CM | POA: Diagnosis not present

## 2022-08-08 DIAGNOSIS — M79671 Pain in right foot: Secondary | ICD-10-CM | POA: Diagnosis not present

## 2022-08-10 DIAGNOSIS — E785 Hyperlipidemia, unspecified: Secondary | ICD-10-CM | POA: Diagnosis not present

## 2022-08-10 DIAGNOSIS — M1990 Unspecified osteoarthritis, unspecified site: Secondary | ICD-10-CM | POA: Diagnosis not present

## 2022-08-10 DIAGNOSIS — I10 Essential (primary) hypertension: Secondary | ICD-10-CM | POA: Diagnosis not present

## 2022-08-10 DIAGNOSIS — K219 Gastro-esophageal reflux disease without esophagitis: Secondary | ICD-10-CM | POA: Diagnosis not present

## 2022-08-10 DIAGNOSIS — E1149 Type 2 diabetes mellitus with other diabetic neurological complication: Secondary | ICD-10-CM | POA: Diagnosis not present

## 2022-08-11 DIAGNOSIS — M545 Low back pain, unspecified: Secondary | ICD-10-CM | POA: Diagnosis not present

## 2022-08-11 DIAGNOSIS — R269 Unspecified abnormalities of gait and mobility: Secondary | ICD-10-CM | POA: Diagnosis not present

## 2022-08-11 DIAGNOSIS — M79671 Pain in right foot: Secondary | ICD-10-CM | POA: Diagnosis not present

## 2022-08-14 DIAGNOSIS — M545 Low back pain, unspecified: Secondary | ICD-10-CM | POA: Diagnosis not present

## 2022-08-14 DIAGNOSIS — R269 Unspecified abnormalities of gait and mobility: Secondary | ICD-10-CM | POA: Diagnosis not present

## 2022-08-14 DIAGNOSIS — M79671 Pain in right foot: Secondary | ICD-10-CM | POA: Diagnosis not present

## 2022-08-15 DIAGNOSIS — F341 Dysthymic disorder: Secondary | ICD-10-CM | POA: Diagnosis not present

## 2022-08-16 DIAGNOSIS — G4733 Obstructive sleep apnea (adult) (pediatric): Secondary | ICD-10-CM | POA: Diagnosis not present

## 2022-08-18 ENCOUNTER — Other Ambulatory Visit: Payer: Self-pay | Admitting: Cardiology

## 2022-08-21 DIAGNOSIS — M545 Low back pain, unspecified: Secondary | ICD-10-CM | POA: Diagnosis not present

## 2022-08-21 DIAGNOSIS — M79671 Pain in right foot: Secondary | ICD-10-CM | POA: Diagnosis not present

## 2022-08-21 DIAGNOSIS — R269 Unspecified abnormalities of gait and mobility: Secondary | ICD-10-CM | POA: Diagnosis not present

## 2022-08-27 DIAGNOSIS — G4733 Obstructive sleep apnea (adult) (pediatric): Secondary | ICD-10-CM | POA: Diagnosis not present

## 2022-08-30 ENCOUNTER — Ambulatory Visit: Payer: Medicare PPO | Admitting: Cardiology

## 2022-09-01 DIAGNOSIS — M79671 Pain in right foot: Secondary | ICD-10-CM | POA: Diagnosis not present

## 2022-09-01 DIAGNOSIS — R269 Unspecified abnormalities of gait and mobility: Secondary | ICD-10-CM | POA: Diagnosis not present

## 2022-09-01 DIAGNOSIS — M545 Low back pain, unspecified: Secondary | ICD-10-CM | POA: Diagnosis not present

## 2022-09-04 DIAGNOSIS — M545 Low back pain, unspecified: Secondary | ICD-10-CM | POA: Diagnosis not present

## 2022-09-04 DIAGNOSIS — R269 Unspecified abnormalities of gait and mobility: Secondary | ICD-10-CM | POA: Diagnosis not present

## 2022-09-04 DIAGNOSIS — M79671 Pain in right foot: Secondary | ICD-10-CM | POA: Diagnosis not present

## 2022-09-06 DIAGNOSIS — F3341 Major depressive disorder, recurrent, in partial remission: Secondary | ICD-10-CM | POA: Diagnosis not present

## 2022-09-06 DIAGNOSIS — F422 Mixed obsessional thoughts and acts: Secondary | ICD-10-CM | POA: Diagnosis not present

## 2022-09-06 DIAGNOSIS — G4700 Insomnia, unspecified: Secondary | ICD-10-CM | POA: Diagnosis not present

## 2022-09-06 DIAGNOSIS — F411 Generalized anxiety disorder: Secondary | ICD-10-CM | POA: Diagnosis not present

## 2022-09-07 ENCOUNTER — Other Ambulatory Visit: Payer: Self-pay | Admitting: Cardiology

## 2022-09-07 DIAGNOSIS — M545 Low back pain, unspecified: Secondary | ICD-10-CM | POA: Diagnosis not present

## 2022-09-07 DIAGNOSIS — M79671 Pain in right foot: Secondary | ICD-10-CM | POA: Diagnosis not present

## 2022-09-07 DIAGNOSIS — R269 Unspecified abnormalities of gait and mobility: Secondary | ICD-10-CM | POA: Diagnosis not present

## 2022-09-12 DIAGNOSIS — K219 Gastro-esophageal reflux disease without esophagitis: Secondary | ICD-10-CM | POA: Diagnosis not present

## 2022-09-12 DIAGNOSIS — E785 Hyperlipidemia, unspecified: Secondary | ICD-10-CM | POA: Diagnosis not present

## 2022-09-12 DIAGNOSIS — E1149 Type 2 diabetes mellitus with other diabetic neurological complication: Secondary | ICD-10-CM | POA: Diagnosis not present

## 2022-09-12 DIAGNOSIS — I10 Essential (primary) hypertension: Secondary | ICD-10-CM | POA: Diagnosis not present

## 2022-09-12 DIAGNOSIS — E1169 Type 2 diabetes mellitus with other specified complication: Secondary | ICD-10-CM | POA: Diagnosis not present

## 2022-09-13 DIAGNOSIS — F341 Dysthymic disorder: Secondary | ICD-10-CM | POA: Diagnosis not present

## 2022-09-13 DIAGNOSIS — R269 Unspecified abnormalities of gait and mobility: Secondary | ICD-10-CM | POA: Diagnosis not present

## 2022-09-13 DIAGNOSIS — M79671 Pain in right foot: Secondary | ICD-10-CM | POA: Diagnosis not present

## 2022-09-13 DIAGNOSIS — M545 Low back pain, unspecified: Secondary | ICD-10-CM | POA: Diagnosis not present

## 2022-09-20 DIAGNOSIS — M79671 Pain in right foot: Secondary | ICD-10-CM | POA: Diagnosis not present

## 2022-09-20 DIAGNOSIS — R269 Unspecified abnormalities of gait and mobility: Secondary | ICD-10-CM | POA: Diagnosis not present

## 2022-09-20 DIAGNOSIS — M545 Low back pain, unspecified: Secondary | ICD-10-CM | POA: Diagnosis not present

## 2022-09-22 DIAGNOSIS — M545 Low back pain, unspecified: Secondary | ICD-10-CM | POA: Diagnosis not present

## 2022-09-22 DIAGNOSIS — M79671 Pain in right foot: Secondary | ICD-10-CM | POA: Diagnosis not present

## 2022-09-22 DIAGNOSIS — R269 Unspecified abnormalities of gait and mobility: Secondary | ICD-10-CM | POA: Diagnosis not present

## 2022-09-27 DIAGNOSIS — G4733 Obstructive sleep apnea (adult) (pediatric): Secondary | ICD-10-CM | POA: Diagnosis not present

## 2022-09-29 DIAGNOSIS — M79671 Pain in right foot: Secondary | ICD-10-CM | POA: Diagnosis not present

## 2022-09-29 DIAGNOSIS — N390 Urinary tract infection, site not specified: Secondary | ICD-10-CM | POA: Diagnosis not present

## 2022-09-29 DIAGNOSIS — R300 Dysuria: Secondary | ICD-10-CM | POA: Diagnosis not present

## 2022-09-29 DIAGNOSIS — M545 Low back pain, unspecified: Secondary | ICD-10-CM | POA: Diagnosis not present

## 2022-09-29 DIAGNOSIS — R269 Unspecified abnormalities of gait and mobility: Secondary | ICD-10-CM | POA: Diagnosis not present

## 2022-10-02 DIAGNOSIS — F341 Dysthymic disorder: Secondary | ICD-10-CM | POA: Diagnosis not present

## 2022-10-03 DIAGNOSIS — S6992XA Unspecified injury of left wrist, hand and finger(s), initial encounter: Secondary | ICD-10-CM | POA: Diagnosis not present

## 2022-10-03 DIAGNOSIS — S63502A Unspecified sprain of left wrist, initial encounter: Secondary | ICD-10-CM | POA: Diagnosis not present

## 2022-10-03 DIAGNOSIS — N202 Calculus of kidney with calculus of ureter: Secondary | ICD-10-CM | POA: Diagnosis not present

## 2022-10-13 DIAGNOSIS — M79671 Pain in right foot: Secondary | ICD-10-CM | POA: Diagnosis not present

## 2022-10-13 DIAGNOSIS — R269 Unspecified abnormalities of gait and mobility: Secondary | ICD-10-CM | POA: Diagnosis not present

## 2022-10-13 DIAGNOSIS — M545 Low back pain, unspecified: Secondary | ICD-10-CM | POA: Diagnosis not present

## 2022-10-16 DIAGNOSIS — R269 Unspecified abnormalities of gait and mobility: Secondary | ICD-10-CM | POA: Diagnosis not present

## 2022-10-16 DIAGNOSIS — M545 Low back pain, unspecified: Secondary | ICD-10-CM | POA: Diagnosis not present

## 2022-10-16 DIAGNOSIS — M79671 Pain in right foot: Secondary | ICD-10-CM | POA: Diagnosis not present

## 2022-10-17 DIAGNOSIS — E1122 Type 2 diabetes mellitus with diabetic chronic kidney disease: Secondary | ICD-10-CM | POA: Diagnosis not present

## 2022-10-17 DIAGNOSIS — Z6834 Body mass index (BMI) 34.0-34.9, adult: Secondary | ICD-10-CM | POA: Diagnosis not present

## 2022-10-17 DIAGNOSIS — E1140 Type 2 diabetes mellitus with diabetic neuropathy, unspecified: Secondary | ICD-10-CM | POA: Diagnosis not present

## 2022-10-18 DIAGNOSIS — F341 Dysthymic disorder: Secondary | ICD-10-CM | POA: Diagnosis not present

## 2022-10-19 DIAGNOSIS — R269 Unspecified abnormalities of gait and mobility: Secondary | ICD-10-CM | POA: Diagnosis not present

## 2022-10-19 DIAGNOSIS — M79671 Pain in right foot: Secondary | ICD-10-CM | POA: Diagnosis not present

## 2022-10-19 DIAGNOSIS — M545 Low back pain, unspecified: Secondary | ICD-10-CM | POA: Diagnosis not present

## 2022-10-23 DIAGNOSIS — M545 Low back pain, unspecified: Secondary | ICD-10-CM | POA: Diagnosis not present

## 2022-10-23 DIAGNOSIS — M79671 Pain in right foot: Secondary | ICD-10-CM | POA: Diagnosis not present

## 2022-10-23 DIAGNOSIS — R269 Unspecified abnormalities of gait and mobility: Secondary | ICD-10-CM | POA: Diagnosis not present

## 2022-10-26 DIAGNOSIS — R269 Unspecified abnormalities of gait and mobility: Secondary | ICD-10-CM | POA: Diagnosis not present

## 2022-10-26 DIAGNOSIS — M79671 Pain in right foot: Secondary | ICD-10-CM | POA: Diagnosis not present

## 2022-10-26 DIAGNOSIS — G4733 Obstructive sleep apnea (adult) (pediatric): Secondary | ICD-10-CM | POA: Diagnosis not present

## 2022-10-26 DIAGNOSIS — M545 Low back pain, unspecified: Secondary | ICD-10-CM | POA: Diagnosis not present

## 2022-10-30 DIAGNOSIS — F341 Dysthymic disorder: Secondary | ICD-10-CM | POA: Diagnosis not present

## 2022-11-02 DIAGNOSIS — M79671 Pain in right foot: Secondary | ICD-10-CM | POA: Diagnosis not present

## 2022-11-02 DIAGNOSIS — M545 Low back pain, unspecified: Secondary | ICD-10-CM | POA: Diagnosis not present

## 2022-11-02 DIAGNOSIS — R269 Unspecified abnormalities of gait and mobility: Secondary | ICD-10-CM | POA: Diagnosis not present

## 2022-11-03 DIAGNOSIS — M545 Low back pain, unspecified: Secondary | ICD-10-CM | POA: Diagnosis not present

## 2022-11-03 DIAGNOSIS — M79671 Pain in right foot: Secondary | ICD-10-CM | POA: Diagnosis not present

## 2022-11-03 DIAGNOSIS — R269 Unspecified abnormalities of gait and mobility: Secondary | ICD-10-CM | POA: Diagnosis not present

## 2022-11-06 DIAGNOSIS — R269 Unspecified abnormalities of gait and mobility: Secondary | ICD-10-CM | POA: Diagnosis not present

## 2022-11-06 DIAGNOSIS — M79671 Pain in right foot: Secondary | ICD-10-CM | POA: Diagnosis not present

## 2022-11-06 DIAGNOSIS — M545 Low back pain, unspecified: Secondary | ICD-10-CM | POA: Diagnosis not present

## 2022-11-08 DIAGNOSIS — E1140 Type 2 diabetes mellitus with diabetic neuropathy, unspecified: Secondary | ICD-10-CM | POA: Diagnosis not present

## 2022-11-08 DIAGNOSIS — Z9989 Dependence on other enabling machines and devices: Secondary | ICD-10-CM | POA: Diagnosis not present

## 2022-11-08 DIAGNOSIS — I700 Atherosclerosis of aorta: Secondary | ICD-10-CM | POA: Diagnosis not present

## 2022-11-08 DIAGNOSIS — G2581 Restless legs syndrome: Secondary | ICD-10-CM | POA: Diagnosis not present

## 2022-11-08 DIAGNOSIS — E1169 Type 2 diabetes mellitus with other specified complication: Secondary | ICD-10-CM | POA: Diagnosis not present

## 2022-11-08 DIAGNOSIS — I10 Essential (primary) hypertension: Secondary | ICD-10-CM | POA: Diagnosis not present

## 2022-11-09 DIAGNOSIS — M79671 Pain in right foot: Secondary | ICD-10-CM | POA: Diagnosis not present

## 2022-11-09 DIAGNOSIS — M545 Low back pain, unspecified: Secondary | ICD-10-CM | POA: Diagnosis not present

## 2022-11-09 DIAGNOSIS — R269 Unspecified abnormalities of gait and mobility: Secondary | ICD-10-CM | POA: Diagnosis not present

## 2022-11-13 DIAGNOSIS — M79671 Pain in right foot: Secondary | ICD-10-CM | POA: Diagnosis not present

## 2022-11-13 DIAGNOSIS — R269 Unspecified abnormalities of gait and mobility: Secondary | ICD-10-CM | POA: Diagnosis not present

## 2022-11-13 DIAGNOSIS — M545 Low back pain, unspecified: Secondary | ICD-10-CM | POA: Diagnosis not present

## 2022-11-15 DIAGNOSIS — G4733 Obstructive sleep apnea (adult) (pediatric): Secondary | ICD-10-CM | POA: Diagnosis not present

## 2022-11-17 DIAGNOSIS — R269 Unspecified abnormalities of gait and mobility: Secondary | ICD-10-CM | POA: Diagnosis not present

## 2022-11-17 DIAGNOSIS — M545 Low back pain, unspecified: Secondary | ICD-10-CM | POA: Diagnosis not present

## 2022-11-17 DIAGNOSIS — M79671 Pain in right foot: Secondary | ICD-10-CM | POA: Diagnosis not present

## 2022-11-22 DIAGNOSIS — M545 Low back pain, unspecified: Secondary | ICD-10-CM | POA: Diagnosis not present

## 2022-11-22 DIAGNOSIS — R269 Unspecified abnormalities of gait and mobility: Secondary | ICD-10-CM | POA: Diagnosis not present

## 2022-11-22 DIAGNOSIS — M79671 Pain in right foot: Secondary | ICD-10-CM | POA: Diagnosis not present

## 2022-11-24 DIAGNOSIS — M545 Low back pain, unspecified: Secondary | ICD-10-CM | POA: Diagnosis not present

## 2022-11-24 DIAGNOSIS — M79671 Pain in right foot: Secondary | ICD-10-CM | POA: Diagnosis not present

## 2022-11-24 DIAGNOSIS — R269 Unspecified abnormalities of gait and mobility: Secondary | ICD-10-CM | POA: Diagnosis not present

## 2022-11-26 DIAGNOSIS — G4733 Obstructive sleep apnea (adult) (pediatric): Secondary | ICD-10-CM | POA: Diagnosis not present

## 2022-11-27 DIAGNOSIS — R269 Unspecified abnormalities of gait and mobility: Secondary | ICD-10-CM | POA: Diagnosis not present

## 2022-11-27 DIAGNOSIS — M79671 Pain in right foot: Secondary | ICD-10-CM | POA: Diagnosis not present

## 2022-11-27 DIAGNOSIS — M545 Low back pain, unspecified: Secondary | ICD-10-CM | POA: Diagnosis not present

## 2022-11-28 DIAGNOSIS — I10 Essential (primary) hypertension: Secondary | ICD-10-CM | POA: Diagnosis not present

## 2022-11-28 DIAGNOSIS — E1169 Type 2 diabetes mellitus with other specified complication: Secondary | ICD-10-CM | POA: Diagnosis not present

## 2022-11-28 DIAGNOSIS — K219 Gastro-esophageal reflux disease without esophagitis: Secondary | ICD-10-CM | POA: Diagnosis not present

## 2022-11-28 DIAGNOSIS — E785 Hyperlipidemia, unspecified: Secondary | ICD-10-CM | POA: Diagnosis not present

## 2022-12-01 DIAGNOSIS — R269 Unspecified abnormalities of gait and mobility: Secondary | ICD-10-CM | POA: Diagnosis not present

## 2022-12-01 DIAGNOSIS — M545 Low back pain, unspecified: Secondary | ICD-10-CM | POA: Diagnosis not present

## 2022-12-01 DIAGNOSIS — M79671 Pain in right foot: Secondary | ICD-10-CM | POA: Diagnosis not present

## 2022-12-04 DIAGNOSIS — R269 Unspecified abnormalities of gait and mobility: Secondary | ICD-10-CM | POA: Diagnosis not present

## 2022-12-04 DIAGNOSIS — M79671 Pain in right foot: Secondary | ICD-10-CM | POA: Diagnosis not present

## 2022-12-04 DIAGNOSIS — M545 Low back pain, unspecified: Secondary | ICD-10-CM | POA: Diagnosis not present

## 2022-12-08 DIAGNOSIS — M545 Low back pain, unspecified: Secondary | ICD-10-CM | POA: Diagnosis not present

## 2022-12-08 DIAGNOSIS — Z981 Arthrodesis status: Secondary | ICD-10-CM | POA: Diagnosis not present

## 2022-12-08 DIAGNOSIS — M542 Cervicalgia: Secondary | ICD-10-CM | POA: Diagnosis not present

## 2022-12-08 DIAGNOSIS — R269 Unspecified abnormalities of gait and mobility: Secondary | ICD-10-CM | POA: Diagnosis not present

## 2022-12-08 DIAGNOSIS — Z6834 Body mass index (BMI) 34.0-34.9, adult: Secondary | ICD-10-CM | POA: Diagnosis not present

## 2022-12-08 DIAGNOSIS — M79671 Pain in right foot: Secondary | ICD-10-CM | POA: Diagnosis not present

## 2022-12-12 DIAGNOSIS — R269 Unspecified abnormalities of gait and mobility: Secondary | ICD-10-CM | POA: Diagnosis not present

## 2022-12-12 DIAGNOSIS — M79671 Pain in right foot: Secondary | ICD-10-CM | POA: Diagnosis not present

## 2022-12-12 DIAGNOSIS — M545 Low back pain, unspecified: Secondary | ICD-10-CM | POA: Diagnosis not present

## 2022-12-13 ENCOUNTER — Other Ambulatory Visit: Payer: Self-pay | Admitting: Cardiology

## 2022-12-15 DIAGNOSIS — R269 Unspecified abnormalities of gait and mobility: Secondary | ICD-10-CM | POA: Diagnosis not present

## 2022-12-15 DIAGNOSIS — M79671 Pain in right foot: Secondary | ICD-10-CM | POA: Diagnosis not present

## 2022-12-15 DIAGNOSIS — M545 Low back pain, unspecified: Secondary | ICD-10-CM | POA: Diagnosis not present

## 2022-12-20 DIAGNOSIS — G629 Polyneuropathy, unspecified: Secondary | ICD-10-CM | POA: Diagnosis not present

## 2022-12-20 DIAGNOSIS — Z6834 Body mass index (BMI) 34.0-34.9, adult: Secondary | ICD-10-CM | POA: Diagnosis not present

## 2022-12-20 DIAGNOSIS — E1142 Type 2 diabetes mellitus with diabetic polyneuropathy: Secondary | ICD-10-CM | POA: Diagnosis not present

## 2022-12-21 DIAGNOSIS — M545 Low back pain, unspecified: Secondary | ICD-10-CM | POA: Diagnosis not present

## 2022-12-21 DIAGNOSIS — M79671 Pain in right foot: Secondary | ICD-10-CM | POA: Diagnosis not present

## 2022-12-21 DIAGNOSIS — R269 Unspecified abnormalities of gait and mobility: Secondary | ICD-10-CM | POA: Diagnosis not present

## 2022-12-22 DIAGNOSIS — R269 Unspecified abnormalities of gait and mobility: Secondary | ICD-10-CM | POA: Diagnosis not present

## 2022-12-22 DIAGNOSIS — M545 Low back pain, unspecified: Secondary | ICD-10-CM | POA: Diagnosis not present

## 2022-12-22 DIAGNOSIS — M79671 Pain in right foot: Secondary | ICD-10-CM | POA: Diagnosis not present

## 2022-12-26 DIAGNOSIS — M79671 Pain in right foot: Secondary | ICD-10-CM | POA: Diagnosis not present

## 2022-12-26 DIAGNOSIS — G4733 Obstructive sleep apnea (adult) (pediatric): Secondary | ICD-10-CM | POA: Diagnosis not present

## 2022-12-26 DIAGNOSIS — M545 Low back pain, unspecified: Secondary | ICD-10-CM | POA: Diagnosis not present

## 2022-12-26 DIAGNOSIS — Z981 Arthrodesis status: Secondary | ICD-10-CM | POA: Diagnosis not present

## 2022-12-26 DIAGNOSIS — R269 Unspecified abnormalities of gait and mobility: Secondary | ICD-10-CM | POA: Diagnosis not present

## 2022-12-26 DIAGNOSIS — M542 Cervicalgia: Secondary | ICD-10-CM | POA: Diagnosis not present

## 2022-12-29 DIAGNOSIS — M545 Low back pain, unspecified: Secondary | ICD-10-CM | POA: Diagnosis not present

## 2022-12-29 DIAGNOSIS — R269 Unspecified abnormalities of gait and mobility: Secondary | ICD-10-CM | POA: Diagnosis not present

## 2022-12-29 DIAGNOSIS — M79671 Pain in right foot: Secondary | ICD-10-CM | POA: Diagnosis not present

## 2023-01-01 DIAGNOSIS — M79671 Pain in right foot: Secondary | ICD-10-CM | POA: Diagnosis not present

## 2023-01-01 DIAGNOSIS — M542 Cervicalgia: Secondary | ICD-10-CM | POA: Diagnosis not present

## 2023-01-01 DIAGNOSIS — R269 Unspecified abnormalities of gait and mobility: Secondary | ICD-10-CM | POA: Diagnosis not present

## 2023-01-01 DIAGNOSIS — M545 Low back pain, unspecified: Secondary | ICD-10-CM | POA: Diagnosis not present

## 2023-01-01 DIAGNOSIS — Z6835 Body mass index (BMI) 35.0-35.9, adult: Secondary | ICD-10-CM | POA: Diagnosis not present

## 2023-01-03 DIAGNOSIS — M545 Low back pain, unspecified: Secondary | ICD-10-CM | POA: Diagnosis not present

## 2023-01-03 DIAGNOSIS — M79671 Pain in right foot: Secondary | ICD-10-CM | POA: Diagnosis not present

## 2023-01-03 DIAGNOSIS — R269 Unspecified abnormalities of gait and mobility: Secondary | ICD-10-CM | POA: Diagnosis not present

## 2023-01-03 DIAGNOSIS — M542 Cervicalgia: Secondary | ICD-10-CM | POA: Diagnosis not present

## 2023-01-10 DIAGNOSIS — F331 Major depressive disorder, recurrent, moderate: Secondary | ICD-10-CM | POA: Diagnosis not present

## 2023-01-10 DIAGNOSIS — F410 Panic disorder [episodic paroxysmal anxiety] without agoraphobia: Secondary | ICD-10-CM | POA: Diagnosis not present

## 2023-01-10 DIAGNOSIS — M79671 Pain in right foot: Secondary | ICD-10-CM | POA: Diagnosis not present

## 2023-01-10 DIAGNOSIS — R269 Unspecified abnormalities of gait and mobility: Secondary | ICD-10-CM | POA: Diagnosis not present

## 2023-01-10 DIAGNOSIS — F422 Mixed obsessional thoughts and acts: Secondary | ICD-10-CM | POA: Diagnosis not present

## 2023-01-10 DIAGNOSIS — M542 Cervicalgia: Secondary | ICD-10-CM | POA: Diagnosis not present

## 2023-01-10 DIAGNOSIS — M545 Low back pain, unspecified: Secondary | ICD-10-CM | POA: Diagnosis not present

## 2023-01-12 DIAGNOSIS — M79671 Pain in right foot: Secondary | ICD-10-CM | POA: Diagnosis not present

## 2023-01-12 DIAGNOSIS — M545 Low back pain, unspecified: Secondary | ICD-10-CM | POA: Diagnosis not present

## 2023-01-12 DIAGNOSIS — R269 Unspecified abnormalities of gait and mobility: Secondary | ICD-10-CM | POA: Diagnosis not present

## 2023-01-12 DIAGNOSIS — M542 Cervicalgia: Secondary | ICD-10-CM | POA: Diagnosis not present

## 2023-01-18 DIAGNOSIS — M545 Low back pain, unspecified: Secondary | ICD-10-CM | POA: Diagnosis not present

## 2023-01-18 DIAGNOSIS — R269 Unspecified abnormalities of gait and mobility: Secondary | ICD-10-CM | POA: Diagnosis not present

## 2023-01-18 DIAGNOSIS — M542 Cervicalgia: Secondary | ICD-10-CM | POA: Diagnosis not present

## 2023-01-18 DIAGNOSIS — M79671 Pain in right foot: Secondary | ICD-10-CM | POA: Diagnosis not present

## 2023-01-22 DIAGNOSIS — M542 Cervicalgia: Secondary | ICD-10-CM | POA: Diagnosis not present

## 2023-01-22 DIAGNOSIS — M545 Low back pain, unspecified: Secondary | ICD-10-CM | POA: Diagnosis not present

## 2023-01-22 DIAGNOSIS — R269 Unspecified abnormalities of gait and mobility: Secondary | ICD-10-CM | POA: Diagnosis not present

## 2023-01-22 DIAGNOSIS — M79671 Pain in right foot: Secondary | ICD-10-CM | POA: Diagnosis not present

## 2023-01-26 DIAGNOSIS — M79671 Pain in right foot: Secondary | ICD-10-CM | POA: Diagnosis not present

## 2023-01-26 DIAGNOSIS — G4733 Obstructive sleep apnea (adult) (pediatric): Secondary | ICD-10-CM | POA: Diagnosis not present

## 2023-01-26 DIAGNOSIS — M542 Cervicalgia: Secondary | ICD-10-CM | POA: Diagnosis not present

## 2023-01-26 DIAGNOSIS — R269 Unspecified abnormalities of gait and mobility: Secondary | ICD-10-CM | POA: Diagnosis not present

## 2023-01-26 DIAGNOSIS — M545 Low back pain, unspecified: Secondary | ICD-10-CM | POA: Diagnosis not present

## 2023-01-30 DIAGNOSIS — R269 Unspecified abnormalities of gait and mobility: Secondary | ICD-10-CM | POA: Diagnosis not present

## 2023-01-30 DIAGNOSIS — M79671 Pain in right foot: Secondary | ICD-10-CM | POA: Diagnosis not present

## 2023-01-30 DIAGNOSIS — M545 Low back pain, unspecified: Secondary | ICD-10-CM | POA: Diagnosis not present

## 2023-01-30 DIAGNOSIS — M542 Cervicalgia: Secondary | ICD-10-CM | POA: Diagnosis not present

## 2023-01-31 DIAGNOSIS — M542 Cervicalgia: Secondary | ICD-10-CM | POA: Diagnosis not present

## 2023-01-31 DIAGNOSIS — R269 Unspecified abnormalities of gait and mobility: Secondary | ICD-10-CM | POA: Diagnosis not present

## 2023-01-31 DIAGNOSIS — M79671 Pain in right foot: Secondary | ICD-10-CM | POA: Diagnosis not present

## 2023-01-31 DIAGNOSIS — M545 Low back pain, unspecified: Secondary | ICD-10-CM | POA: Diagnosis not present

## 2023-02-07 DIAGNOSIS — M79671 Pain in right foot: Secondary | ICD-10-CM | POA: Diagnosis not present

## 2023-02-07 DIAGNOSIS — M545 Low back pain, unspecified: Secondary | ICD-10-CM | POA: Diagnosis not present

## 2023-02-07 DIAGNOSIS — M542 Cervicalgia: Secondary | ICD-10-CM | POA: Diagnosis not present

## 2023-02-07 DIAGNOSIS — R269 Unspecified abnormalities of gait and mobility: Secondary | ICD-10-CM | POA: Diagnosis not present

## 2023-02-08 DIAGNOSIS — E1169 Type 2 diabetes mellitus with other specified complication: Secondary | ICD-10-CM | POA: Diagnosis not present

## 2023-02-09 DIAGNOSIS — M79671 Pain in right foot: Secondary | ICD-10-CM | POA: Diagnosis not present

## 2023-02-09 DIAGNOSIS — M545 Low back pain, unspecified: Secondary | ICD-10-CM | POA: Diagnosis not present

## 2023-02-09 DIAGNOSIS — M542 Cervicalgia: Secondary | ICD-10-CM | POA: Diagnosis not present

## 2023-02-09 DIAGNOSIS — R269 Unspecified abnormalities of gait and mobility: Secondary | ICD-10-CM | POA: Diagnosis not present

## 2023-02-12 DIAGNOSIS — M542 Cervicalgia: Secondary | ICD-10-CM | POA: Diagnosis not present

## 2023-02-12 DIAGNOSIS — M545 Low back pain, unspecified: Secondary | ICD-10-CM | POA: Diagnosis not present

## 2023-02-12 DIAGNOSIS — R269 Unspecified abnormalities of gait and mobility: Secondary | ICD-10-CM | POA: Diagnosis not present

## 2023-02-12 DIAGNOSIS — M79671 Pain in right foot: Secondary | ICD-10-CM | POA: Diagnosis not present

## 2023-02-13 ENCOUNTER — Encounter: Payer: Self-pay | Admitting: Neurology

## 2023-02-13 ENCOUNTER — Ambulatory Visit: Payer: Medicare PPO | Admitting: Neurology

## 2023-02-13 VITALS — BP 107/71 | HR 75 | Ht 66.0 in | Wt 222.0 lb

## 2023-02-13 DIAGNOSIS — R208 Other disturbances of skin sensation: Secondary | ICD-10-CM | POA: Diagnosis not present

## 2023-02-13 DIAGNOSIS — Z8739 Personal history of other diseases of the musculoskeletal system and connective tissue: Secondary | ICD-10-CM | POA: Diagnosis not present

## 2023-02-13 DIAGNOSIS — Z981 Arthrodesis status: Secondary | ICD-10-CM

## 2023-02-13 MED ORDER — GABAPENTIN 100 MG PO CAPS: ORAL_CAPSULE | ORAL | 11 refills | Status: AC

## 2023-02-13 NOTE — Progress Notes (Signed)
[facility]  PATIENT: [name] DOB: [birth year]  REFERRING DOCTOR OR PCP: Sigmund Hazel, MD SOURCE: Patient, notes from primary care, imaging reports, MRI images personally reviewed.  _________________________________   HISTORICAL  CHIEF COMPLAINT:  Chief Complaint  Patient presents with   Room 10    Pt is here Alone. Pt states se get nerve sensations that often starts with her legs that go up her whole body that last 30-40sec and comes and goes 4-5 times per day. Pt states that when she has her feet up her legs numb.     HISTORY OF PRESENT ILLNESS:  I had the pleasure of seeing your patient, [name], at [facility] for neurologic consultation regarding her intermittent sensory symptoms.  She is a 72 year old woman who has reports sensations that start in her legs and shoot up her body to the neck and arms.  The sensations last 10 to 15 seconds..   She notes these occur 5-10 times a day.   These can occur in any position, though mostly while resting (especially if laying down).  She denies any jerking or motor component to the symptom.    She was placed on gabapentin 200 mg po qHS with mild benefit.    She tolerates it well.      She had cervical radiculopathy and spinal stenosis in the past .   She has had fusion from C3-C7 (initially C4-C7, then C3-C4).  I was able to review films from 2012 and 2024.  The MRI from 2012 shows the fusion from C4-C7 and spinal stenosis at C3-C4.  There is a hint of increased signal within the spinal cord on sagittal STIR images but this is not confirmed on any of the other sequences so could be artifactual.  The 2024 MRI shows extension of the fusion to include C3-C4.  She denies any arm weakness.   Bladder function is fine.   She has a reduced balance and has had a few falls.  With her falls, she stumbles but has ttrouble catching herself  Imaging: MRI cervical spine 12/26/2022 showed fusion from  C3 to C7 but no nerve root compression and no more than borderline spinal stenosis.  MRI cervical spine 06/29/2001 (report) showed spinal stenosis  at C5C6 > C6C7.    REVIEW OF SYSTEMS: Constitutional: No fevers, chills, sweats, or change in appetite Eyes: No visual changes, double vision, eye pain Ear, nose and throat: No hearing loss, ear pain, nasal congestion, sore throat Cardiovascular: No chest pain, palpitations Respiratory:  No shortness of breath at rest or with exertion.   No wheezes GastrointestinaI: No nausea, vomiting, diarrhea, abdominal pain, fecal incontinence Genitourinary:  No dysuria, urinary retention or frequency.  No nocturia. Musculoskeletal:  No neck pain, back pain Integumentary: No rash, pruritus, skin lesions Neurological: as above Psychiatric: No depression at this time.  No anxiety Endocrine: No palpitations, diaphoresis, change in appetite, change in weigh or increased thirst Hematologic/Lymphatic:  No anemia, purpura, petechiae. Allergic/Immunologic: No itchy/runny eyes, nasal congestion, recent allergic reactions, rashes  ALLERGIES: Allergies  Allergen Reactions   Guaifenesin & Derivatives Itching   Morphine And Codeine Itching    Hydrocodone and oxycodone   Penicillins Itching   Tramadol Itching   Hydrocodone-Acetaminophen Itching   Oxycodone Hcl Itching    HOME MEDICATIONS:  Current Outpatient Medications:    aspirin 81 MG tablet, Take 81 mg by mouth daily., Disp: , Rfl:    atorvastatin (LIPITOR) 80 MG tablet, TAKE  ONE TABLET BY MOUTH ONCE DAILY, Disp: 90 tablet, Rfl: 1   calcium carbonate (OSCAL) 1500 (600 Ca) MG TABS tablet, Take 600 mg of elemental calcium by mouth daily with breakfast., Disp: , Rfl:    celecoxib (CELEBREX) 100 MG capsule, Take 100 mg by mouth daily., Disp: , Rfl:    Cholecalciferol (VITAMIN D3 PO), Take 2,000 Units by mouth daily., Disp: , Rfl:    clonazePAM (KLONOPIN) 0.5 MG tablet, Take 0.5 mg by mouth daily. , Disp: ,  Rfl: 2   desvenlafaxine (PRISTIQ) 50 MG 24 hr tablet, Take 25 mg by mouth daily. , Disp: , Rfl:    ergocalciferol (VITAMIN D2) 1.25 MG (50000 UT) capsule, Take by mouth., Disp: , Rfl:    esomeprazole (NEXIUM) 20 MG capsule, Take 1 capsule (20 mg total) by mouth daily. Please schedule a yearly follow up for further refills. Thank you., Disp: 90 capsule, Rfl: 0   ezetimibe (ZETIA) 10 MG tablet, Take 1 tablet (10 mg total) by mouth daily., Disp: 90 tablet, Rfl: 3   FERROUS SULFATE PO, Take 65 mg by mouth daily., Disp: , Rfl:    gabapentin (NEURONTIN) 100 MG capsule, Take 1 or 2 three times a day, Disp: 180 capsule, Rfl: 11   ibuprofen (ADVIL,MOTRIN) 200 MG tablet, Take 400 mg by mouth as needed (pain). , Disp: , Rfl:    losartan (COZAAR) 25 MG tablet, Take 25 mg by mouth daily., Disp: , Rfl:    metFORMIN (GLUCOPHAGE) 500 MG tablet, Take 500 mg by mouth 2 (two) times daily with a meal. , Disp: , Rfl:    Multiple Vitamins-Minerals (PRESERVISION AREDS 2 PO), Take 1 capsule by mouth daily., Disp: , Rfl:    NONFORMULARY OR COMPOUNDED ITEM, Apply topically 2 (two) times daily. Salicylic Acid 40%, Urea 40% Cream, apply to the affected area once or twice daily, Disp: , Rfl:    Omega-3 Fatty Acids (FISH OIL) 1200 MG CAPS, Take 3 capsules by mouth daily., Disp: , Rfl:    traZODone (DESYREL) 50 MG tablet, Take 150 mg by mouth at bedtime., Disp: , Rfl:    vitamin B-12 (CYANOCOBALAMIN) 1000 MCG tablet, 1 tablet, Disp: , Rfl:    ammonium lactate (LAC-HYDRIN) 12 % cream, Apply topically as needed for dry skin., Disp: 385 g, Rfl: 3   ciclopirox (PENLAC) 8 % solution, APPLY TOPICALLY AT BEDTIME. APPLY OVER NAIL FOLD AND SURROUNDING SKIN DAILY OVER PERIVOUS COAT. AFTER 7 DAY MAY REMOVE WITH ALCOHOL AND CONTINUE CYCLE, Disp: 6.6 mL, Rfl: 0   lisinopril (ZESTRIL) 10 MG tablet, Take 1 tablet (10 mg total) by mouth every morning. (Patient not taking: Reported on 02/13/2023), Disp: 90 tablet, Rfl: 3   meloxicam (MOBIC) 7.5  MG tablet, Take by mouth. (Patient not taking: Reported on 03/25/2021), Disp: , Rfl:    nitroGLYCERIN (NITRODUR - DOSED IN MG/24 HR) 0.1 mg/hr patch, Place 0.1 mg onto the skin daily. Back of right heel to aid with circulation. (Patient not taking: Reported on 02/13/2023), Disp: , Rfl:   PAST MEDICAL HISTORY: Past Medical History:  Diagnosis Date   Arthritis    Atherosclerosis    Back pain    Cancer (HCC) 2000   hx of skin cancer , basal cell   Chronic fatigue    and weakness   Colon polyps    Constipation    Coronary artery disease    nonobstructive with 20% OM by cath 2006   Depression    Diabetes mellitus without complication (HCC)  type 2    Diastolic dysfunction    w Elevated LVEDP   DJD (degenerative joint disease)    C3/4 Dr Newell Coral   Edema, lower extremity    GERD (gastroesophageal reflux disease)    High cholesterol    History of kidney stones    Hypertension    Hypoglycemia    Kidney stones    Multiple thyroid nodules    Neurocardiogenic syncope    Obesity    Osteoarthritis    PONV (postoperative nausea and vomiting)    Prediabetes 2011 & 2012   Right knee pain 12/2009   Dr August Saucer   Sleep apnea    CPAP had Sleep Study ordered by Dr. Mayford Knife    Tick bite 11/2016   Vasovagal syncope     PAST SURGICAL HISTORY: Past Surgical History:  Procedure Laterality Date   ACHILLES TENDON SURGERY Left 01/23/2013   Procedure: EXCISION PARTIAL BONE TALUS/CALCANEUS, REPAIR RUPTURE ACHILLES TENDON PRIMARY OPEN ;  Surgeon: Loreta Ave, MD;  Location: [facility];  Service: Orthopedics;  Laterality: Left;   ANTERIOR FUSION CERVICAL SPINE  2000, 2002   x2-cervical   BREAST BIOPSY Right 2007   BREAST CYST EXCISION Left 2007   CARDIAC CATHETERIZATION  ~ 2008   Nonobstructive ASCAD, 20% OM   CARPAL TUNNEL RELEASE Left 2002   COLONOSCOPY     CYSTOSCOPY W/ URETERAL STENT PLACEMENT Left 12/23/2019   Procedure: CYSTOSCOPY WITH RETROGRADE PYELOGRAM/URETERAL  STENT PLACEMENT;  Surgeon: Crist Fat, MD;  Location: [facility];  Service: Urology;  Laterality: Left;   CYSTOSCOPY/URETEROSCOPY/HOLMIUM LASER/STENT PLACEMENT Left 01/05/2020   Procedure: CYSTOSCOPY LEFT URETEROSCOPY/HOLMIUM LASER/STENT EXCHANGE;  Surgeon: Bjorn Pippin, MD;  Location: [facility];  Service: Urology;  Laterality: Left;   EXTRACORPOREAL SHOCK WAVE LITHOTRIPSY Left 12/22/2019   Procedure: LEFT EXTRACORPOREAL SHOCK WAVE LITHOTRIPSY (ESWL);  Surgeon: Crist Fat, MD;  Location: [facility];  Service: Urology;  Laterality: Left;   LITHOTRIPSY     OTHER SURGICAL HISTORY  06/12/2018   Left big toe nail surgery. [facility]    TONSILLECTOMY     TOTAL KNEE ARTHROPLASTY Right 11/2010   TOTAL KNEE ARTHROPLASTY  12/13/2011   Procedure: TOTAL KNEE ARTHROPLASTY;  Surgeon: Loreta Ave, MD;  Location: [facility];  Service: Orthopedics;  Laterality: Left;  DR MURPHY WANTS 90 MINUTES FOR THIS CASE   UPPER GASTROINTESTINAL ENDOSCOPY      FAMILY HISTORY: Family History  Problem Relation Age of Onset   CVA Mother    Diverticulosis Mother    Kidney disease Mother    Hypertension Mother    Depression Mother    CAD Father    Cancer - Prostate Father    Heart disease Father    Diverticulosis Father    Hypertension Father    Sudden death Father    Hypertension Sister    Diverticulosis Sister    Breast cancer Sister    Crohn's disease Paternal Aunt    Cancer Paternal Aunt        x 2, type unknown   Heart disease Paternal Grandfather    Colon cancer Other        maternal great Aunt   Esophageal cancer Neg Hx    Stomach cancer Neg Hx    Rectal cancer Neg Hx     SOCIAL HISTORY: Social History   Socioeconomic History   Marital status: Single    Spouse name: Not on file   Number of children: 0  Years of education: 20   Highest education level: Not on file  Occupational History   Occupation: retired/ Professor of Music  Tobacco Use   Smoking  status: Never    Passive exposure: Past (Mother)   Smokeless tobacco: Never  Vaping Use   Vaping status: Never Used  Substance and Sexual Activity   Alcohol use: Yes    Alcohol/week: 6.0 standard drinks of alcohol    Types: 6 Glasses of wine per week    Comment: couple times per week.   Drug use: Never   Sexual activity: Never  Other Topics Concern   Not on file  Social History Narrative   Not on file   Social Determinants of Health   Financial Resource Strain: Not on file  Food Insecurity: Not on file  Transportation Needs: Not on file  Physical Activity: Not on file  Stress: Not on file  Social Connections: Not on file  Intimate Partner Violence: Not on file       PHYSICAL EXAM  Vitals:   02/13/23 1429  BP: 107/71  Pulse: 75  Weight: 222 lb (100.7 kg)  Height: 5\' 6"  (1.676 m)    Body mass index is 35.83 kg/m.   General: The patient is well-developed and well-nourished and in no acute distress  HEENT:  Head is [place]/AT.  Sclera are anicteric.    Neck: No carotid bruits are noted.  The neck is nontender.  Range of motion is mildly reduced in the neck  Cardiovascular: The heart has a regular rate and rhythm with a normal S1 and S2. There were no murmurs, gallops or rubs.    Skin: Extremities are without rash or  edema.  Musculoskeletal:  Back is nontender  Neurologic Exam  Mental status: The patient is alert and oriented x 3 at the time of the examination. The patient has apparent normal recent and remote memory, with an apparently normal attention span and concentration ability.   Speech is normal.  Cranial nerves: Extraocular movements are full. . There is good facial sensation to soft touch bilaterally.Facial strength is normal.  Trapezius and sternocleidomastoid strength is normal. No dysarthria is noted.  The tongue is midline, and the patient has symmetric elevation of the soft palate. No obvious hearing deficits are noted.  Motor:  Muscle bulk is  normal.   Tone is normal. Strength is  5 / 5 in all 4 extremities.   Sensory: Sensory testing is intact to pinprick, soft touch and vibration sensation in all 4 extremities.  She had good vibration sensation in the toes though she felt a tuning fork more on the left than the right.  Sensation was symmetric at the ankles and knees, however.  Coordination: Cerebellar testing reveals good finger-nose-finger and heel-to-shin bilaterally.  Gait and station: Station is normal.   Gait is normal. Tandem gait is wide. Romberg is negative.   Reflexes: Deep tendon reflexes are symmetric and normal in arms, 1 at kness and trace at ankles.   Plantar responses are flexor.    DIAGNOSTIC DATA (LABS, IMAGING, TESTING) - I reviewed patient records, labs, notes, testing and imaging myself where available.  Lab Results  Component Value Date   WBC 5.3 02/03/2020   HGB 11.9 02/03/2020   HCT 37.0 02/03/2020   MCV 85 02/03/2020   PLT 186 02/03/2020      Component Value Date/Time   NA 145 (H) 02/03/2020 1546   K 4.2 02/03/2020 1546   CL 106 02/03/2020 1546  CO2 24 02/03/2020 1546   GLUCOSE 114 (H) 02/03/2020 1546   GLUCOSE 172 (H) 12/24/2019 0401   BUN 18 02/03/2020 1546   CREATININE 0.90 02/03/2020 1546   CALCIUM 9.6 02/03/2020 1546   PROT 6.5 02/03/2020 1546   ALBUMIN 4.6 02/03/2020 1546   AST 13 02/03/2020 1546   ALT 26 01/09/2022 1240   ALKPHOS 101 02/03/2020 1546   BILITOT 0.2 02/03/2020 1546   GFRNONAA 65 02/03/2020 1546   GFRAA 75 02/03/2020 1546   Lab Results  Component Value Date   CHOL 108 01/09/2022   HDL 42 01/09/2022   LDLCALC 45 01/09/2022   LDLDIRECT 93.3 09/17/2013   TRIG 116 01/09/2022   CHOLHDL 2.6 01/09/2022   Lab Results  Component Value Date   HGBA1C 6.4 (H) 02/03/2020   Lab Results  Component Value Date   VITAMINB12 1,170 02/03/2020   Lab Results  Component Value Date   TSH 2.720 02/03/2020       ASSESSMENT AND PLAN  Dysesthesia  History of fusion  of cervical spine  Personal history of spinal stenosis   In summary, this patient is a 72 year old woman with sensory symptoms shooting up from her feet to her neck that occur several times a day and last 10 to 15 seconds.  Although the etiology is not completely certain, I would be most concerned about a cervical spine process, possibly related to her multiple episodes of spinal stenosis in the past.  Her current MRI does not show any current significant spinal stenosis.  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  To help with her sensory symptoms I will have her increase the gabapentin from 200 mg at night to 100 mg in the morning, 100 mg in the afternoon and 200 mg at night.  This can be increased further if she gets more benefit.  If this dose is poorly tolerated or higher doses do not help, we also could consider lamotrigine as an alternative treatment for spinal cord related dysesthesias.  She will return to see me as needed and give [name] a call if we need to make an adjustment in the medication.  She should also call if she has new or worsening neurologic symptoms.     Kordell Jafri A. Epimenio Foot, MD, [facility] 02/13/2023, 5:35 PM Certified in Neurology, Clinical Neurophysiology, Sleep Medicine and Neuroimaging  [facility] [address], [address] [place], [place] [address] [phone]

## 2023-02-14 DIAGNOSIS — M545 Low back pain, unspecified: Secondary | ICD-10-CM | POA: Diagnosis not present

## 2023-02-14 DIAGNOSIS — M79671 Pain in right foot: Secondary | ICD-10-CM | POA: Diagnosis not present

## 2023-02-14 DIAGNOSIS — R269 Unspecified abnormalities of gait and mobility: Secondary | ICD-10-CM | POA: Diagnosis not present

## 2023-02-14 DIAGNOSIS — M542 Cervicalgia: Secondary | ICD-10-CM | POA: Diagnosis not present

## 2023-02-16 DIAGNOSIS — Z6835 Body mass index (BMI) 35.0-35.9, adult: Secondary | ICD-10-CM | POA: Diagnosis not present

## 2023-02-16 DIAGNOSIS — M47812 Spondylosis without myelopathy or radiculopathy, cervical region: Secondary | ICD-10-CM | POA: Diagnosis not present

## 2023-02-19 DIAGNOSIS — R269 Unspecified abnormalities of gait and mobility: Secondary | ICD-10-CM | POA: Diagnosis not present

## 2023-02-19 DIAGNOSIS — M79671 Pain in right foot: Secondary | ICD-10-CM | POA: Diagnosis not present

## 2023-02-19 DIAGNOSIS — M542 Cervicalgia: Secondary | ICD-10-CM | POA: Diagnosis not present

## 2023-02-19 DIAGNOSIS — M545 Low back pain, unspecified: Secondary | ICD-10-CM | POA: Diagnosis not present

## 2023-02-23 DIAGNOSIS — H524 Presbyopia: Secondary | ICD-10-CM | POA: Diagnosis not present

## 2023-02-23 DIAGNOSIS — R269 Unspecified abnormalities of gait and mobility: Secondary | ICD-10-CM | POA: Diagnosis not present

## 2023-02-23 DIAGNOSIS — M545 Low back pain, unspecified: Secondary | ICD-10-CM | POA: Diagnosis not present

## 2023-02-23 DIAGNOSIS — M542 Cervicalgia: Secondary | ICD-10-CM | POA: Diagnosis not present

## 2023-02-23 DIAGNOSIS — H353132 Nonexudative age-related macular degeneration, bilateral, intermediate dry stage: Secondary | ICD-10-CM | POA: Diagnosis not present

## 2023-02-23 DIAGNOSIS — H2513 Age-related nuclear cataract, bilateral: Secondary | ICD-10-CM | POA: Diagnosis not present

## 2023-02-23 DIAGNOSIS — M79671 Pain in right foot: Secondary | ICD-10-CM | POA: Diagnosis not present

## 2023-02-25 DIAGNOSIS — G4733 Obstructive sleep apnea (adult) (pediatric): Secondary | ICD-10-CM | POA: Diagnosis not present

## 2023-02-27 DIAGNOSIS — M542 Cervicalgia: Secondary | ICD-10-CM | POA: Diagnosis not present

## 2023-02-27 DIAGNOSIS — M79671 Pain in right foot: Secondary | ICD-10-CM | POA: Diagnosis not present

## 2023-02-27 DIAGNOSIS — M545 Low back pain, unspecified: Secondary | ICD-10-CM | POA: Diagnosis not present

## 2023-02-27 DIAGNOSIS — R269 Unspecified abnormalities of gait and mobility: Secondary | ICD-10-CM | POA: Diagnosis not present

## 2023-02-27 DIAGNOSIS — M47812 Spondylosis without myelopathy or radiculopathy, cervical region: Secondary | ICD-10-CM | POA: Diagnosis not present

## 2023-03-02 DIAGNOSIS — M542 Cervicalgia: Secondary | ICD-10-CM | POA: Diagnosis not present

## 2023-03-02 DIAGNOSIS — M545 Low back pain, unspecified: Secondary | ICD-10-CM | POA: Diagnosis not present

## 2023-03-02 DIAGNOSIS — M79671 Pain in right foot: Secondary | ICD-10-CM | POA: Diagnosis not present

## 2023-03-02 DIAGNOSIS — R269 Unspecified abnormalities of gait and mobility: Secondary | ICD-10-CM | POA: Diagnosis not present

## 2023-03-07 DIAGNOSIS — M545 Low back pain, unspecified: Secondary | ICD-10-CM | POA: Diagnosis not present

## 2023-03-07 DIAGNOSIS — M79671 Pain in right foot: Secondary | ICD-10-CM | POA: Diagnosis not present

## 2023-03-07 DIAGNOSIS — M542 Cervicalgia: Secondary | ICD-10-CM | POA: Diagnosis not present

## 2023-03-07 DIAGNOSIS — R269 Unspecified abnormalities of gait and mobility: Secondary | ICD-10-CM | POA: Diagnosis not present

## 2023-03-09 DIAGNOSIS — M542 Cervicalgia: Secondary | ICD-10-CM | POA: Diagnosis not present

## 2023-03-09 DIAGNOSIS — R269 Unspecified abnormalities of gait and mobility: Secondary | ICD-10-CM | POA: Diagnosis not present

## 2023-03-09 DIAGNOSIS — M545 Low back pain, unspecified: Secondary | ICD-10-CM | POA: Diagnosis not present

## 2023-03-09 DIAGNOSIS — M79671 Pain in right foot: Secondary | ICD-10-CM | POA: Diagnosis not present

## 2023-03-12 IMAGING — MG MM DIGITAL DIAGNOSTIC UNILAT*L* W/ TOMO W/ CAD
4 series · 4 of 12 positions shown · non-contrast
Comparison: Previous exam(s).

CLINICAL DATA: Screening recall for a possible left breast
asymmetry.

EXAM:
DIGITAL DIAGNOSTIC UNILATERAL LEFT MAMMOGRAM WITH TOMOSYNTHESIS AND
CAD
TECHNIQUE: Left digital diagnostic mammography and breast tomosynthesis was
performed. The images were evaluated with computer-aided detection.

[L CC synth-2D]
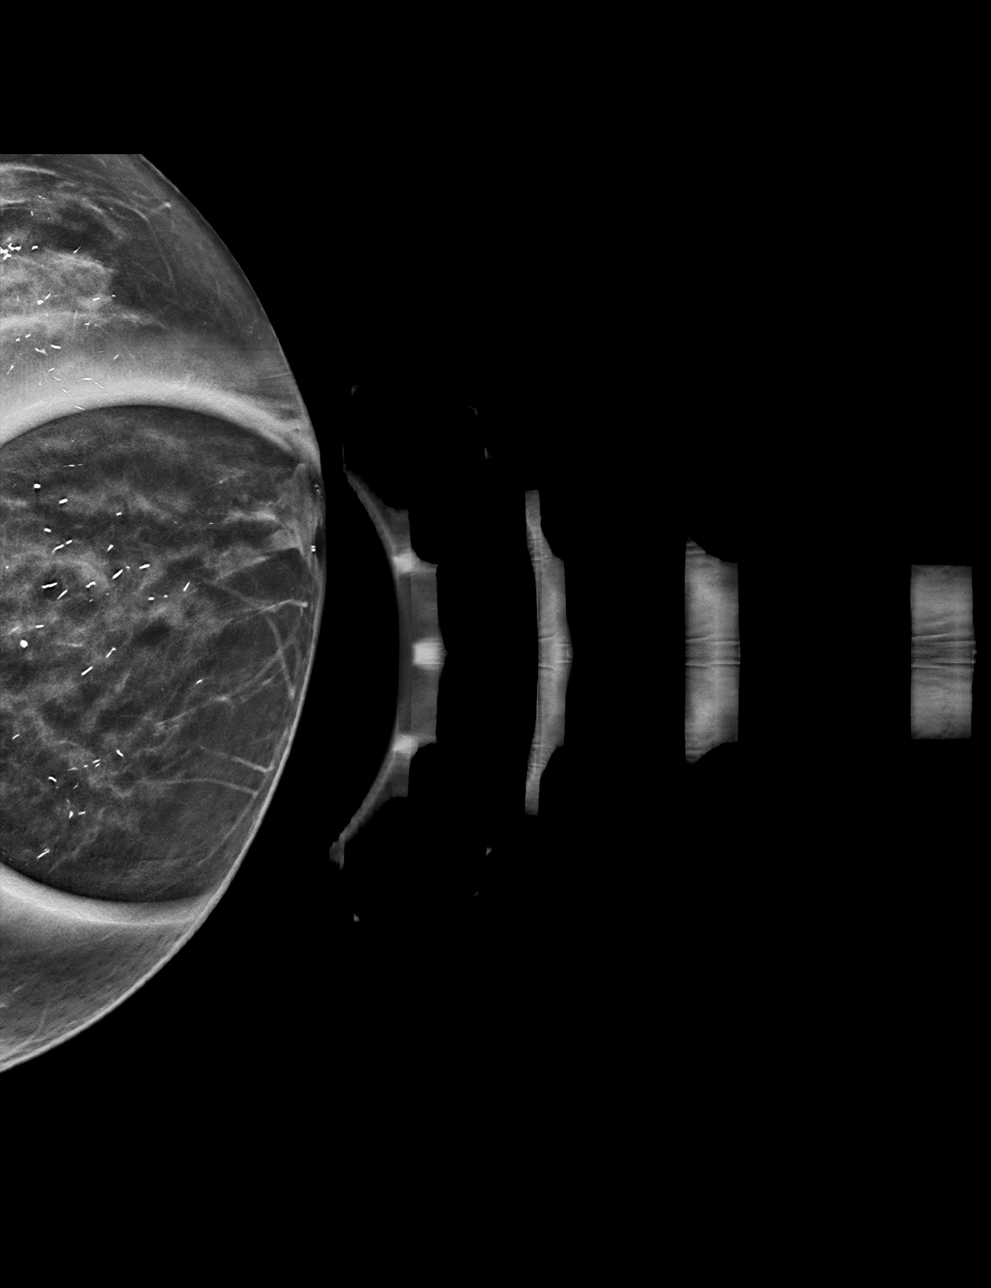

[L MLO synth-2D]
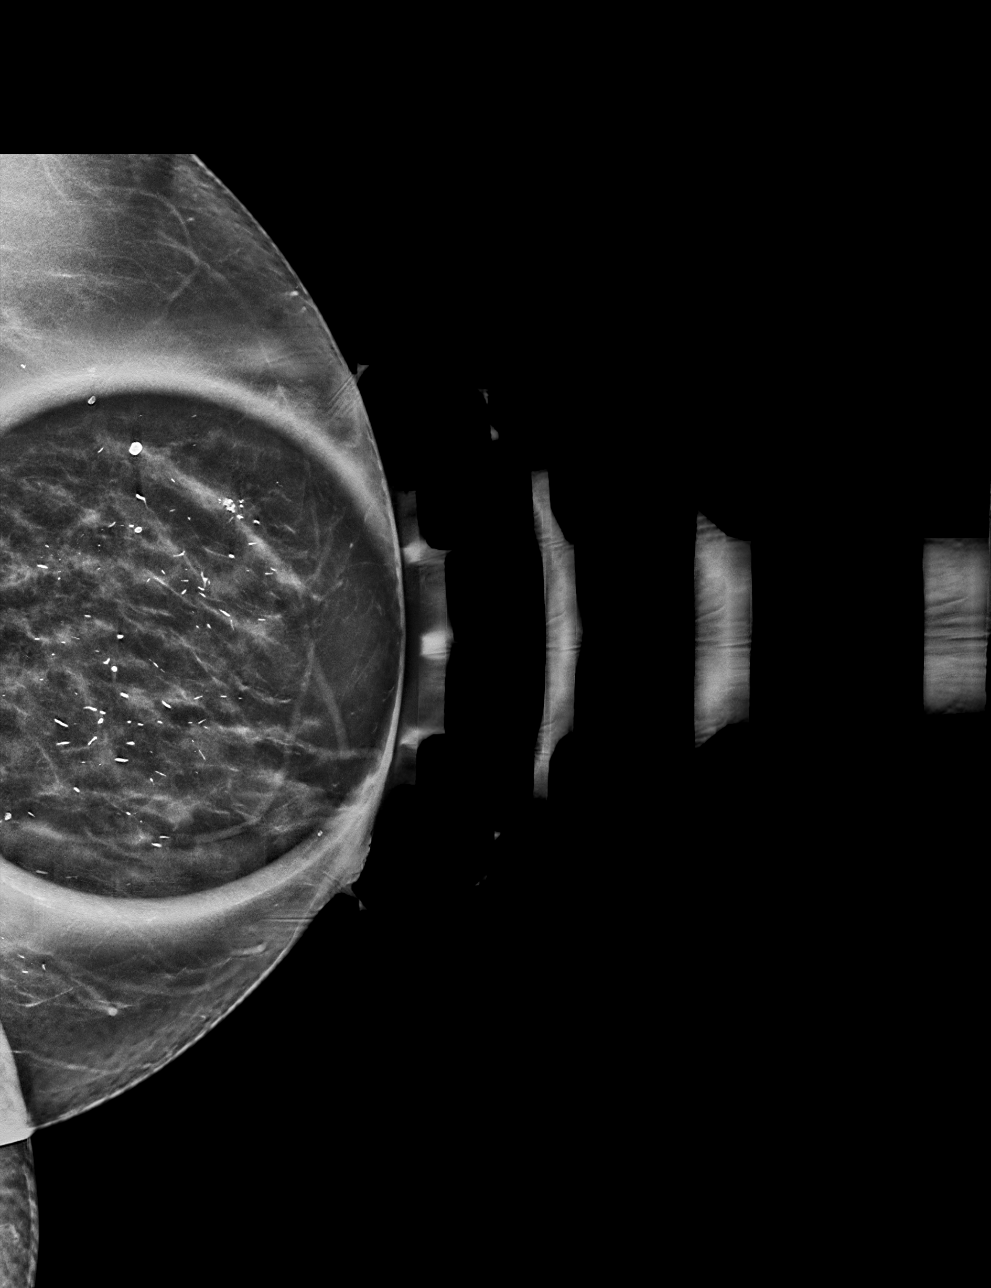

[L MLO tomo · tomo slice 30/59.0]
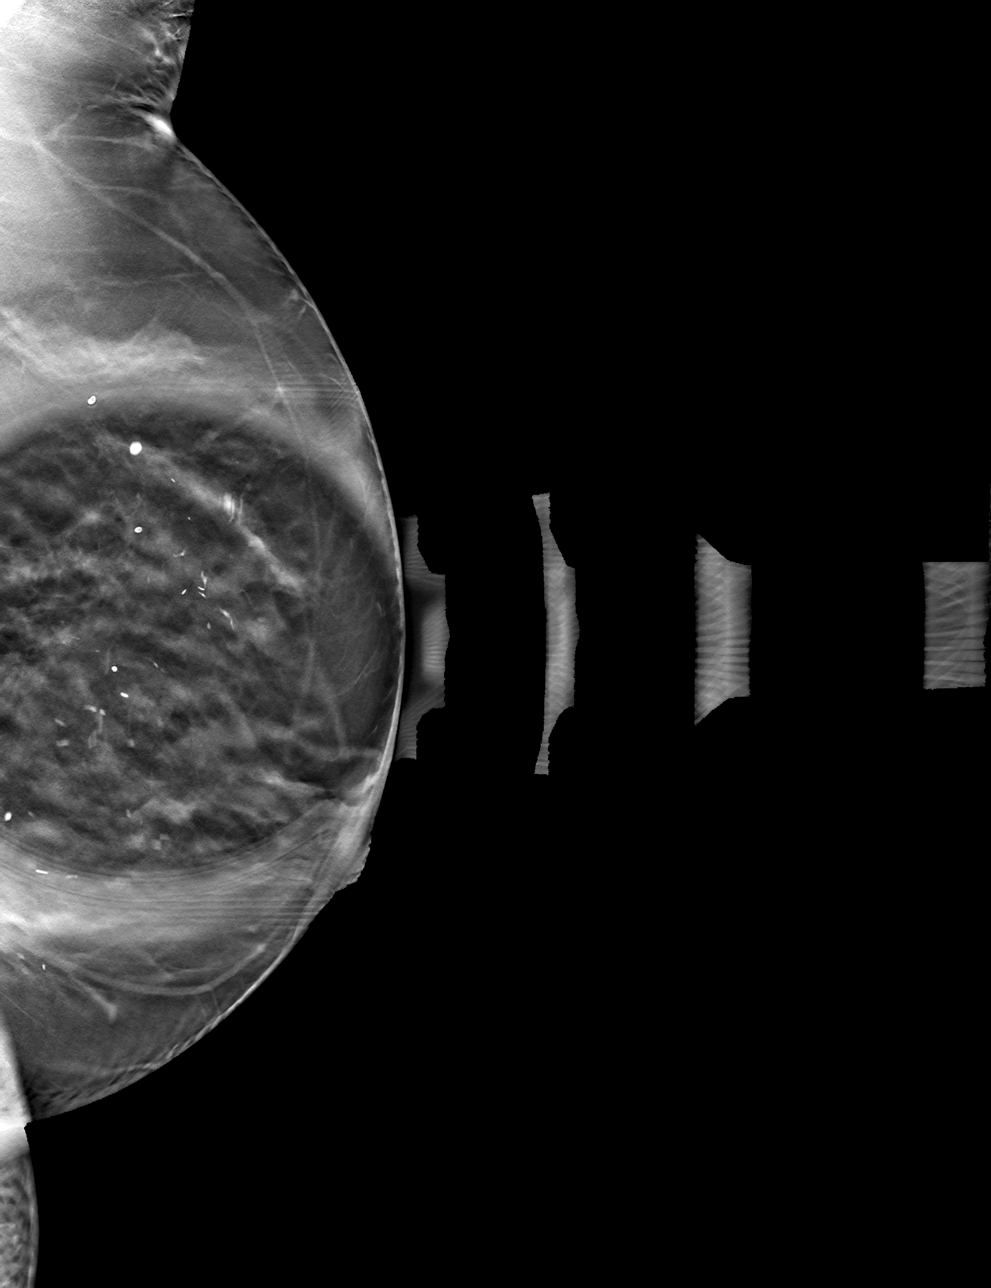

[L CC tomo · tomo slice 26/51.0]
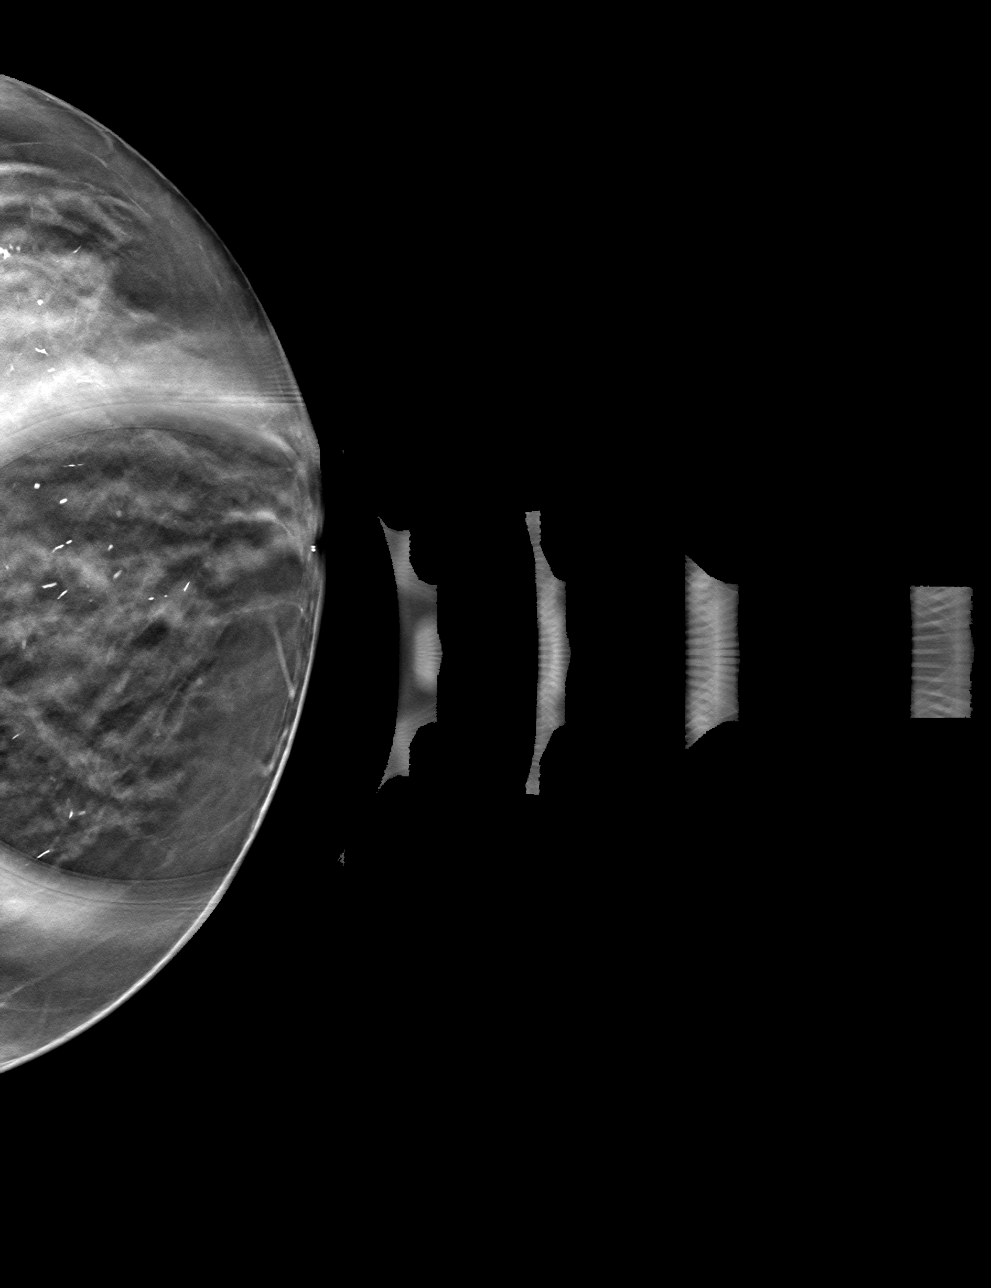

[4 of 12 positions shown; findings below may reference images not displayed]

ACR Breast Density Category c: The breast tissue is heterogeneously
dense, which may obscure small masses.
FINDINGS: The possible asymmetry, which was noted in the upper inner left
breast on the current screening study, disperses with spot
compression imaging consistent with superimposed fibroglandular
tissue. There is no underlying mass, architectural distortion or
significant residual asymmetry. There are numerous bilateral benign
secretory calcifications that are stable. There are no suspicious
calcifications.
IMPRESSION: 1. No evidence of breast malignancy.
2. Benign bilateral breast calcifications.

RECOMMENDATION:
Screening mammogram in one year.(Code:BG-N-26G)

I have discussed the findings and recommendations with the patient.
If applicable, a reminder letter will be sent to the patient
regarding the next appointment.

BI-RADS CATEGORY  2: Benign.

## 2023-03-14 DIAGNOSIS — M79671 Pain in right foot: Secondary | ICD-10-CM | POA: Diagnosis not present

## 2023-03-14 DIAGNOSIS — M542 Cervicalgia: Secondary | ICD-10-CM | POA: Diagnosis not present

## 2023-03-14 DIAGNOSIS — R269 Unspecified abnormalities of gait and mobility: Secondary | ICD-10-CM | POA: Diagnosis not present

## 2023-03-14 DIAGNOSIS — M545 Low back pain, unspecified: Secondary | ICD-10-CM | POA: Diagnosis not present

## 2023-03-16 DIAGNOSIS — M79671 Pain in right foot: Secondary | ICD-10-CM | POA: Diagnosis not present

## 2023-03-16 DIAGNOSIS — M545 Low back pain, unspecified: Secondary | ICD-10-CM | POA: Diagnosis not present

## 2023-03-16 DIAGNOSIS — M542 Cervicalgia: Secondary | ICD-10-CM | POA: Diagnosis not present

## 2023-03-16 DIAGNOSIS — R269 Unspecified abnormalities of gait and mobility: Secondary | ICD-10-CM | POA: Diagnosis not present

## 2023-03-19 DIAGNOSIS — M47812 Spondylosis without myelopathy or radiculopathy, cervical region: Secondary | ICD-10-CM | POA: Diagnosis not present

## 2023-03-28 DIAGNOSIS — R269 Unspecified abnormalities of gait and mobility: Secondary | ICD-10-CM | POA: Diagnosis not present

## 2023-03-28 DIAGNOSIS — M542 Cervicalgia: Secondary | ICD-10-CM | POA: Diagnosis not present

## 2023-03-28 DIAGNOSIS — M79671 Pain in right foot: Secondary | ICD-10-CM | POA: Diagnosis not present

## 2023-03-28 DIAGNOSIS — M545 Low back pain, unspecified: Secondary | ICD-10-CM | POA: Diagnosis not present

## 2023-03-28 DIAGNOSIS — G4733 Obstructive sleep apnea (adult) (pediatric): Secondary | ICD-10-CM | POA: Diagnosis not present

## 2023-03-30 DIAGNOSIS — M545 Low back pain, unspecified: Secondary | ICD-10-CM | POA: Diagnosis not present

## 2023-03-30 DIAGNOSIS — M79671 Pain in right foot: Secondary | ICD-10-CM | POA: Diagnosis not present

## 2023-03-30 DIAGNOSIS — R269 Unspecified abnormalities of gait and mobility: Secondary | ICD-10-CM | POA: Diagnosis not present

## 2023-03-30 DIAGNOSIS — M542 Cervicalgia: Secondary | ICD-10-CM | POA: Diagnosis not present

## 2023-04-06 DIAGNOSIS — M79671 Pain in right foot: Secondary | ICD-10-CM | POA: Diagnosis not present

## 2023-04-06 DIAGNOSIS — M542 Cervicalgia: Secondary | ICD-10-CM | POA: Diagnosis not present

## 2023-04-06 DIAGNOSIS — M545 Low back pain, unspecified: Secondary | ICD-10-CM | POA: Diagnosis not present

## 2023-04-06 DIAGNOSIS — R269 Unspecified abnormalities of gait and mobility: Secondary | ICD-10-CM | POA: Diagnosis not present

## 2023-04-12 DIAGNOSIS — F422 Mixed obsessional thoughts and acts: Secondary | ICD-10-CM | POA: Diagnosis not present

## 2023-04-12 DIAGNOSIS — G4700 Insomnia, unspecified: Secondary | ICD-10-CM | POA: Diagnosis not present

## 2023-04-12 DIAGNOSIS — F331 Major depressive disorder, recurrent, moderate: Secondary | ICD-10-CM | POA: Diagnosis not present

## 2023-04-12 DIAGNOSIS — F3341 Major depressive disorder, recurrent, in partial remission: Secondary | ICD-10-CM | POA: Diagnosis not present

## 2023-04-17 DIAGNOSIS — M47812 Spondylosis without myelopathy or radiculopathy, cervical region: Secondary | ICD-10-CM | POA: Diagnosis not present

## 2023-04-18 DIAGNOSIS — M79671 Pain in right foot: Secondary | ICD-10-CM | POA: Diagnosis not present

## 2023-04-18 DIAGNOSIS — M545 Low back pain, unspecified: Secondary | ICD-10-CM | POA: Diagnosis not present

## 2023-04-18 DIAGNOSIS — M542 Cervicalgia: Secondary | ICD-10-CM | POA: Diagnosis not present

## 2023-04-18 DIAGNOSIS — R269 Unspecified abnormalities of gait and mobility: Secondary | ICD-10-CM | POA: Diagnosis not present

## 2023-04-20 DIAGNOSIS — M542 Cervicalgia: Secondary | ICD-10-CM | POA: Diagnosis not present

## 2023-04-20 DIAGNOSIS — M545 Low back pain, unspecified: Secondary | ICD-10-CM | POA: Diagnosis not present

## 2023-04-20 DIAGNOSIS — M79671 Pain in right foot: Secondary | ICD-10-CM | POA: Diagnosis not present

## 2023-04-20 DIAGNOSIS — R269 Unspecified abnormalities of gait and mobility: Secondary | ICD-10-CM | POA: Diagnosis not present

## 2023-04-25 DIAGNOSIS — M79671 Pain in right foot: Secondary | ICD-10-CM | POA: Diagnosis not present

## 2023-04-25 DIAGNOSIS — M542 Cervicalgia: Secondary | ICD-10-CM | POA: Diagnosis not present

## 2023-04-25 DIAGNOSIS — R269 Unspecified abnormalities of gait and mobility: Secondary | ICD-10-CM | POA: Diagnosis not present

## 2023-04-25 DIAGNOSIS — M545 Low back pain, unspecified: Secondary | ICD-10-CM | POA: Diagnosis not present

## 2023-04-27 ENCOUNTER — Telehealth: Payer: Self-pay

## 2023-04-27 MED ORDER — ATORVASTATIN CALCIUM 80 MG PO TABS: 80.0000 mg | ORAL_TABLET | Freq: Every day | ORAL | 3 refills | Status: DC

## 2023-04-27 NOTE — Telephone Encounter (Signed)
Call to patient to see what pharmacy she needs to have her atorvastatin refill sent to. Patient chooses [facility] on [address]. Script sent.

## 2023-04-28 DIAGNOSIS — G4733 Obstructive sleep apnea (adult) (pediatric): Secondary | ICD-10-CM | POA: Diagnosis not present

## 2023-05-03 DIAGNOSIS — M79671 Pain in right foot: Secondary | ICD-10-CM | POA: Diagnosis not present

## 2023-05-03 DIAGNOSIS — M542 Cervicalgia: Secondary | ICD-10-CM | POA: Diagnosis not present

## 2023-05-03 DIAGNOSIS — M47812 Spondylosis without myelopathy or radiculopathy, cervical region: Secondary | ICD-10-CM | POA: Diagnosis not present

## 2023-05-03 DIAGNOSIS — R269 Unspecified abnormalities of gait and mobility: Secondary | ICD-10-CM | POA: Diagnosis not present

## 2023-05-03 DIAGNOSIS — M545 Low back pain, unspecified: Secondary | ICD-10-CM | POA: Diagnosis not present

## 2023-05-08 DIAGNOSIS — G629 Polyneuropathy, unspecified: Secondary | ICD-10-CM | POA: Diagnosis not present

## 2023-05-08 DIAGNOSIS — Z6836 Body mass index (BMI) 36.0-36.9, adult: Secondary | ICD-10-CM | POA: Diagnosis not present

## 2023-05-08 DIAGNOSIS — Z1331 Encounter for screening for depression: Secondary | ICD-10-CM | POA: Diagnosis not present

## 2023-05-08 DIAGNOSIS — Z0000 Encounter for general adult medical examination without abnormal findings: Secondary | ICD-10-CM | POA: Diagnosis not present

## 2023-05-08 DIAGNOSIS — E1142 Type 2 diabetes mellitus with diabetic polyneuropathy: Secondary | ICD-10-CM | POA: Diagnosis not present

## 2023-05-08 DIAGNOSIS — K219 Gastro-esophageal reflux disease without esophagitis: Secondary | ICD-10-CM | POA: Diagnosis not present

## 2023-05-08 DIAGNOSIS — E7800 Pure hypercholesterolemia, unspecified: Secondary | ICD-10-CM | POA: Diagnosis not present

## 2023-05-08 DIAGNOSIS — Z9181 History of falling: Secondary | ICD-10-CM | POA: Diagnosis not present

## 2023-05-14 DIAGNOSIS — M542 Cervicalgia: Secondary | ICD-10-CM | POA: Diagnosis not present

## 2023-05-14 DIAGNOSIS — M545 Low back pain, unspecified: Secondary | ICD-10-CM | POA: Diagnosis not present

## 2023-05-14 DIAGNOSIS — R269 Unspecified abnormalities of gait and mobility: Secondary | ICD-10-CM | POA: Diagnosis not present

## 2023-05-14 DIAGNOSIS — M79671 Pain in right foot: Secondary | ICD-10-CM | POA: Diagnosis not present

## 2023-05-18 DIAGNOSIS — M545 Low back pain, unspecified: Secondary | ICD-10-CM | POA: Diagnosis not present

## 2023-05-18 DIAGNOSIS — M79671 Pain in right foot: Secondary | ICD-10-CM | POA: Diagnosis not present

## 2023-05-18 DIAGNOSIS — R269 Unspecified abnormalities of gait and mobility: Secondary | ICD-10-CM | POA: Diagnosis not present

## 2023-05-18 DIAGNOSIS — M542 Cervicalgia: Secondary | ICD-10-CM | POA: Diagnosis not present

## 2023-05-21 DIAGNOSIS — M25521 Pain in right elbow: Secondary | ICD-10-CM | POA: Diagnosis not present

## 2023-05-25 DIAGNOSIS — M545 Low back pain, unspecified: Secondary | ICD-10-CM | POA: Diagnosis not present

## 2023-05-25 DIAGNOSIS — M79671 Pain in right foot: Secondary | ICD-10-CM | POA: Diagnosis not present

## 2023-05-25 DIAGNOSIS — R269 Unspecified abnormalities of gait and mobility: Secondary | ICD-10-CM | POA: Diagnosis not present

## 2023-05-25 DIAGNOSIS — M542 Cervicalgia: Secondary | ICD-10-CM | POA: Diagnosis not present

## 2023-05-28 DIAGNOSIS — Z6835 Body mass index (BMI) 35.0-35.9, adult: Secondary | ICD-10-CM | POA: Diagnosis not present

## 2023-05-28 DIAGNOSIS — G4733 Obstructive sleep apnea (adult) (pediatric): Secondary | ICD-10-CM | POA: Diagnosis not present

## 2023-05-28 DIAGNOSIS — M47812 Spondylosis without myelopathy or radiculopathy, cervical region: Secondary | ICD-10-CM | POA: Diagnosis not present

## 2023-05-28 DIAGNOSIS — M542 Cervicalgia: Secondary | ICD-10-CM | POA: Diagnosis not present

## 2023-05-30 DIAGNOSIS — M545 Low back pain, unspecified: Secondary | ICD-10-CM | POA: Diagnosis not present

## 2023-05-30 DIAGNOSIS — M79671 Pain in right foot: Secondary | ICD-10-CM | POA: Diagnosis not present

## 2023-05-30 DIAGNOSIS — R269 Unspecified abnormalities of gait and mobility: Secondary | ICD-10-CM | POA: Diagnosis not present

## 2023-05-30 DIAGNOSIS — M542 Cervicalgia: Secondary | ICD-10-CM | POA: Diagnosis not present

## 2023-06-04 DIAGNOSIS — M7541 Impingement syndrome of right shoulder: Secondary | ICD-10-CM | POA: Diagnosis not present

## 2023-06-06 DIAGNOSIS — R269 Unspecified abnormalities of gait and mobility: Secondary | ICD-10-CM | POA: Diagnosis not present

## 2023-06-06 DIAGNOSIS — M79671 Pain in right foot: Secondary | ICD-10-CM | POA: Diagnosis not present

## 2023-06-06 DIAGNOSIS — M545 Low back pain, unspecified: Secondary | ICD-10-CM | POA: Diagnosis not present

## 2023-06-06 DIAGNOSIS — M542 Cervicalgia: Secondary | ICD-10-CM | POA: Diagnosis not present

## 2023-06-13 DIAGNOSIS — M79671 Pain in right foot: Secondary | ICD-10-CM | POA: Diagnosis not present

## 2023-06-13 DIAGNOSIS — M545 Low back pain, unspecified: Secondary | ICD-10-CM | POA: Diagnosis not present

## 2023-06-13 DIAGNOSIS — R269 Unspecified abnormalities of gait and mobility: Secondary | ICD-10-CM | POA: Diagnosis not present

## 2023-06-13 DIAGNOSIS — M542 Cervicalgia: Secondary | ICD-10-CM | POA: Diagnosis not present

## 2023-06-19 ENCOUNTER — Ambulatory Visit (AMBULATORY_SURGERY_CENTER): Payer: Medicare PPO | Admitting: *Deleted

## 2023-06-19 ENCOUNTER — Encounter: Payer: Self-pay | Admitting: Gastroenterology

## 2023-06-19 VITALS — Ht 66.0 in | Wt 210.0 lb

## 2023-06-19 DIAGNOSIS — Z1211 Encounter for screening for malignant neoplasm of colon: Secondary | ICD-10-CM

## 2023-06-19 MED ORDER — NA SULFATE-K SULFATE-MG SULF 17.5-3.13-1.6 GM/177ML PO SOLN: 1.0000 | Freq: Once | ORAL | 0 refills | Status: AC

## 2023-06-19 NOTE — Progress Notes (Signed)
Pt's name and DOB verified at the beginning of the pre-visit wit 2 identifiers  Pt denies any difficulty with ambulating,sitting, laying down or rolling side to side  Pt has issues with ambulation   Pt has no issues moving head neck or swallowing  No egg or soy allergy known to patient   No issues known to pt with past sedation with any surgeries or procedures  Pt denies having issues being intubated  No FH of Malignant Hyperthermia  Pt is not on diet pills or shots   Pt is not on home 02   Pt is not on blood thinners    Pt denies issues with constipation   Pt has frequent issues with constipation RN instructed pt to use Miralax per bottles instructions a week before prep days. Pt states they will  Pt is not on dialysis  Pt denise any abnormal heart rhythms   Pt denies any upcoming cardiac testing  Pt encouraged to use to use Singlecare or Goodrx to reduce cost   Patient's chart reviewed by Cathlyn Parsons CNRA prior to pre-visit and patient appropriate for the [facility].  Pre-visit completed and red dot placed by patient's name on their procedure day (on provider's schedule).  .   Visit in person   Pt states weight is 210 lb   Instructed pt why it is important to and  to call if they have any changes in health or new medications. Directed them to the # given and on instructions.     Instructions reviewed. Pt given both [facility] main # and MD on call # prior to instructions.  Pt states understanding. Instructed to review again prior to procedure. Pt states they will.   Instructions sent by mail with coupon and by My Chart

## 2023-06-27 DIAGNOSIS — M25511 Pain in right shoulder: Secondary | ICD-10-CM | POA: Diagnosis not present

## 2023-06-27 DIAGNOSIS — M545 Low back pain, unspecified: Secondary | ICD-10-CM | POA: Diagnosis not present

## 2023-06-27 DIAGNOSIS — M79671 Pain in right foot: Secondary | ICD-10-CM | POA: Diagnosis not present

## 2023-06-27 DIAGNOSIS — R269 Unspecified abnormalities of gait and mobility: Secondary | ICD-10-CM | POA: Diagnosis not present

## 2023-06-28 DIAGNOSIS — G4733 Obstructive sleep apnea (adult) (pediatric): Secondary | ICD-10-CM | POA: Diagnosis not present

## 2023-07-02 DIAGNOSIS — R269 Unspecified abnormalities of gait and mobility: Secondary | ICD-10-CM | POA: Diagnosis not present

## 2023-07-02 DIAGNOSIS — M25511 Pain in right shoulder: Secondary | ICD-10-CM | POA: Diagnosis not present

## 2023-07-02 DIAGNOSIS — M545 Low back pain, unspecified: Secondary | ICD-10-CM | POA: Diagnosis not present

## 2023-07-02 DIAGNOSIS — M79671 Pain in right foot: Secondary | ICD-10-CM | POA: Diagnosis not present

## 2023-07-03 DIAGNOSIS — M47812 Spondylosis without myelopathy or radiculopathy, cervical region: Secondary | ICD-10-CM | POA: Diagnosis not present

## 2023-07-09 ENCOUNTER — Other Ambulatory Visit: Payer: Self-pay | Admitting: Family Medicine

## 2023-07-09 DIAGNOSIS — M545 Low back pain, unspecified: Secondary | ICD-10-CM | POA: Diagnosis not present

## 2023-07-09 DIAGNOSIS — R269 Unspecified abnormalities of gait and mobility: Secondary | ICD-10-CM | POA: Diagnosis not present

## 2023-07-09 DIAGNOSIS — Z1231 Encounter for screening mammogram for malignant neoplasm of breast: Secondary | ICD-10-CM

## 2023-07-09 DIAGNOSIS — M25511 Pain in right shoulder: Secondary | ICD-10-CM | POA: Diagnosis not present

## 2023-07-09 DIAGNOSIS — M79671 Pain in right foot: Secondary | ICD-10-CM | POA: Diagnosis not present

## 2023-07-13 ENCOUNTER — Encounter: Payer: Self-pay | Admitting: Gastroenterology

## 2023-07-13 ENCOUNTER — Ambulatory Visit (AMBULATORY_SURGERY_CENTER): Payer: Medicare PPO | Admitting: Gastroenterology

## 2023-07-13 VITALS — BP 122/65 | HR 73 | Temp 97.9°F | Resp 16 | Ht 66.0 in | Wt 210.0 lb

## 2023-07-13 DIAGNOSIS — F32A Depression, unspecified: Secondary | ICD-10-CM | POA: Diagnosis not present

## 2023-07-13 DIAGNOSIS — E119 Type 2 diabetes mellitus without complications: Secondary | ICD-10-CM | POA: Diagnosis not present

## 2023-07-13 DIAGNOSIS — K5140 Inflammatory polyps of colon without complications: Secondary | ICD-10-CM | POA: Diagnosis not present

## 2023-07-13 DIAGNOSIS — Z1211 Encounter for screening for malignant neoplasm of colon: Secondary | ICD-10-CM | POA: Diagnosis not present

## 2023-07-13 DIAGNOSIS — K5730 Diverticulosis of large intestine without perforation or abscess without bleeding: Secondary | ICD-10-CM | POA: Diagnosis not present

## 2023-07-13 DIAGNOSIS — F419 Anxiety disorder, unspecified: Secondary | ICD-10-CM | POA: Diagnosis not present

## 2023-07-13 DIAGNOSIS — K648 Other hemorrhoids: Secondary | ICD-10-CM | POA: Diagnosis not present

## 2023-07-13 DIAGNOSIS — K644 Residual hemorrhoidal skin tags: Secondary | ICD-10-CM | POA: Diagnosis not present

## 2023-07-13 DIAGNOSIS — I2510 Atherosclerotic heart disease of native coronary artery without angina pectoris: Secondary | ICD-10-CM | POA: Diagnosis not present

## 2023-07-13 DIAGNOSIS — D123 Benign neoplasm of transverse colon: Secondary | ICD-10-CM | POA: Diagnosis not present

## 2023-07-13 MED ORDER — SODIUM CHLORIDE 0.9 % IV SOLN: 500.0000 mL | Freq: Once | INTRAVENOUS | Status: DC

## 2023-07-13 NOTE — Progress Notes (Signed)
Sedate, gd SR, tolerated procedure well, VSS, report to RN 

## 2023-07-13 NOTE — Progress Notes (Signed)
[facility] History and Physical   Primary Care Physician:  Sigmund Hazel, MD   Reason for Procedure:   Colon cancer screening  Plan:    colonoscopy     HPI: [name] is a 72 y.o. female  here for colonoscopy screening - last exam 05/2013 - no polyps  . Patient reports alternating loose stools and constipation at baseline, more so constipation. Otherwise feels well without any cardiopulmonary symptoms.   I have discussed risks / benefits of anesthesia and endoscopic procedure with [name] and they wish to proceed with the exams as outlined today.    Past Medical History:  Diagnosis Date   Anxiety    Arthritis    Atherosclerosis    Back pain    Chronic fatigue    and weakness   Colon polyps    Constipation    Coronary artery disease    nonobstructive with 20% OM by cath 2006   Depression    Diabetes mellitus without complication (HCC)    type 2    Diastolic dysfunction    w Elevated LVEDP   DJD (degenerative joint disease)    C3/4 Dr Newell Coral   Edema, lower extremity    GERD (gastroesophageal reflux disease)    High cholesterol    History of kidney stones    Hypertension    Hypoglycemia    Kidney stones    Neurocardiogenic syncope    Obesity    Osteoarthritis    PONV (postoperative nausea and vomiting)    Prediabetes 2011 & 2012   Right knee pain 12/2009   Dr August Saucer   Sleep apnea    CPAP had Sleep Study ordered by Dr. Mayford Knife    Tick bite 11/2016   Vasovagal syncope     Past Surgical History:  Procedure Laterality Date   ACHILLES TENDON SURGERY Left 01/23/2013   Procedure: EXCISION PARTIAL BONE TALUS/CALCANEUS, REPAIR RUPTURE ACHILLES TENDON PRIMARY OPEN ;  Surgeon: Loreta Ave, MD;  Location: [facility];  Service: Orthopedics;  Laterality: Left;   ANTERIOR FUSION CERVICAL SPINE  2000, 2002   x2-cervical   BREAST BIOPSY Right 2007   BREAST CYST EXCISION Left 2007   CARDIAC CATHETERIZATION  ~ 2008    Nonobstructive ASCAD, 20% OM   CARPAL TUNNEL RELEASE Left 2002   COLONOSCOPY     CYSTOSCOPY W/ URETERAL STENT PLACEMENT Left 12/23/2019   Procedure: CYSTOSCOPY WITH RETROGRADE PYELOGRAM/URETERAL STENT PLACEMENT;  Surgeon: Crist Fat, MD;  Location: [facility];  Service: Urology;  Laterality: Left;   CYSTOSCOPY/URETEROSCOPY/HOLMIUM LASER/STENT PLACEMENT Left 01/05/2020   Procedure: CYSTOSCOPY LEFT URETEROSCOPY/HOLMIUM LASER/STENT EXCHANGE;  Surgeon: Bjorn Pippin, MD;  Location: [facility];  Service: Urology;  Laterality: Left;   EXTRACORPOREAL SHOCK WAVE LITHOTRIPSY Left 12/22/2019   Procedure: LEFT EXTRACORPOREAL SHOCK WAVE LITHOTRIPSY (ESWL);  Surgeon: Crist Fat, MD;  Location: [facility];  Service: Urology;  Laterality: Left;   LITHOTRIPSY     OTHER SURGICAL HISTORY  06/12/2018   Left big toe nail surgery. [facility]    TONSILLECTOMY     TOTAL KNEE ARTHROPLASTY Right 11/2010   TOTAL KNEE ARTHROPLASTY  12/13/2011   Procedure: TOTAL KNEE ARTHROPLASTY;  Surgeon: Loreta Ave, MD;  Location: [facility];  Service: Orthopedics;  Laterality: Left;  DR MURPHY WANTS 90 MINUTES FOR THIS CASE   UPPER GASTROINTESTINAL ENDOSCOPY      Prior to Admission medications   Medication Sig Start Date End Date Taking? Authorizing  Provider  aspirin 81 MG tablet Take 81 mg by mouth daily.   Yes [provider]  atorvastatin (LIPITOR) 80 MG tablet Take 1 tablet (80 mg total) by mouth daily. 04/27/23  Yes Quintella Reichert, MD  calcium carbonate (OSCAL) 1500 (600 Ca) MG TABS tablet Take 600 mg of elemental calcium by mouth daily with breakfast. 07/22/19  Yes [provider]  celecoxib (CELEBREX) 200 MG capsule TAKE 1 CAPSULE BY MOUTH EVERY DAY WITH FOOD 07/09/23  Yes [provider]  Cholecalciferol (VITAMIN D3 PO) Take 2,000 Units by mouth daily.   Yes [provider]  clonazePAM (KLONOPIN) 0.5 MG tablet Take 0.5 mg by mouth daily.  06/04/18  Yes  [provider]  Cyanocobalamin 1000 MCG CAPS 1 tablet Orally Once a day   Yes [provider]  desvenlafaxine (PRISTIQ) 50 MG 24 hr tablet Take 25 mg by mouth daily.    Yes [provider]  ergocalciferol (VITAMIN D2) 1.25 MG (50000 UT) capsule Take by mouth.   Yes [provider]  esomeprazole (NEXIUM) 20 MG capsule Take 1 capsule (20 mg total) by mouth daily. Please schedule a yearly follow up for further refills. Thank you. 02/06/22  Yes Shaylie Eklund, Willaim Rayas, MD  ezetimibe (ZETIA) 10 MG tablet Take 1 tablet (10 mg total) by mouth daily. 09/07/22  Yes Quintella Reichert, MD  gabapentin (NEURONTIN) 100 MG capsule Take 1 or 2 three times a day 02/13/23  Yes Sater, Pearletha Furl, MD  ibuprofen (ADVIL,MOTRIN) 200 MG tablet Take 400 mg by mouth as needed (pain).    Yes [provider]  lisinopril (ZESTRIL) 10 MG tablet Take 1 tablet (10 mg total) by mouth every morning. 02/18/21  Yes Turner, Cornelious Bryant, MD  losartan (COZAAR) 25 MG tablet Take 25 mg by mouth daily.   Yes [provider]  metFORMIN (GLUCOPHAGE) 500 MG tablet Take 500 mg by mouth 2 (two) times daily with a meal.    Yes [provider]  NON FORMULARY CPAP   Yes [provider]  Omega-3 Fatty Acids (FISH OIL) 1200 MG CAPS Take 3 capsules by mouth daily.   Yes [provider]  traZODone (DESYREL) 50 MG tablet Take 150 mg by mouth at bedtime.   Yes [provider]  vitamin B-12 (CYANOCOBALAMIN) 1000 MCG tablet 1 tablet 12/24/19  Yes [provider]  acetaminophen (TYLENOL) 650 MG CR tablet 2 tablets Orally as needed As needed 01/11/21   [provider]  FERROUS SULFATE PO Take 65 mg by mouth daily. Patient not taking: Reported on 06/19/2023    [provider]  Multiple Vitamins-Minerals (PRESERVISION AREDS 2 PO) Take 1 capsule by mouth daily. Patient not taking: Reported on 06/19/2023    [provider]    Current Outpatient  Medications  Medication Sig Dispense Refill   aspirin 81 MG tablet Take 81 mg by mouth daily.     atorvastatin (LIPITOR) 80 MG tablet Take 1 tablet (80 mg total) by mouth daily. 90 tablet 3   calcium carbonate (OSCAL) 1500 (600 Ca) MG TABS tablet Take 600 mg of elemental calcium by mouth daily with breakfast.     celecoxib (CELEBREX) 200 MG capsule TAKE 1 CAPSULE BY MOUTH EVERY DAY WITH FOOD     Cholecalciferol (VITAMIN D3 PO) Take 2,000 Units by mouth daily.     clonazePAM (KLONOPIN) 0.5 MG tablet Take 0.5 mg by mouth daily.   2   Cyanocobalamin 1000 MCG CAPS 1  tablet Orally Once a day     desvenlafaxine (PRISTIQ) 50 MG 24 hr tablet Take 25 mg by mouth daily.      ergocalciferol (VITAMIN D2) 1.25 MG (50000 UT) capsule Take by mouth.     esomeprazole (NEXIUM) 20 MG capsule Take 1 capsule (20 mg total) by mouth daily. Please schedule a yearly follow up for further refills. Thank you. 90 capsule 0   ezetimibe (ZETIA) 10 MG tablet Take 1 tablet (10 mg total) by mouth daily. 90 tablet 3   gabapentin (NEURONTIN) 100 MG capsule Take 1 or 2 three times a day 180 capsule 11   ibuprofen (ADVIL,MOTRIN) 200 MG tablet Take 400 mg by mouth as needed (pain).      lisinopril (ZESTRIL) 10 MG tablet Take 1 tablet (10 mg total) by mouth every morning. 90 tablet 3   losartan (COZAAR) 25 MG tablet Take 25 mg by mouth daily.     metFORMIN (GLUCOPHAGE) 500 MG tablet Take 500 mg by mouth 2 (two) times daily with a meal.      NON FORMULARY CPAP     Omega-3 Fatty Acids (FISH OIL) 1200 MG CAPS Take 3 capsules by mouth daily.     traZODone (DESYREL) 50 MG tablet Take 150 mg by mouth at bedtime.     vitamin B-12 (CYANOCOBALAMIN) 1000 MCG tablet 1 tablet     acetaminophen (TYLENOL) 650 MG CR tablet 2 tablets Orally as needed As needed     FERROUS SULFATE PO Take 65 mg by mouth daily. (Patient not taking: Reported on 06/19/2023)     Multiple Vitamins-Minerals (PRESERVISION AREDS 2 PO) Take 1 capsule by mouth daily.  (Patient not taking: Reported on 06/19/2023)     Current Facility-Administered Medications  Medication Dose Route Frequency Provider Last Rate Last Admin   0.9 %  sodium chloride infusion  500 mL Intravenous Once [place] Yepiz, Willaim Rayas, MD        Allergies as of 07/13/2023 - Review Complete 07/13/2023  Allergen Reaction Noted   Guaifenesin & derivatives Itching 11/29/2011   Morphine and codeine Itching 11/29/2011   Penicillins Itching 11/29/2011   Tramadol Itching 11/29/2011   Hydrocodone-acetaminophen Itching 09/01/2013   Meperidine hcl Itching 06/19/2023   Oxycodone hcl Itching 09/01/2013    Family History  Problem Relation Age of Onset   CVA Mother    Diverticulosis Mother    Kidney disease Mother    Hypertension Mother    Depression Mother    CAD Father    Cancer - Prostate Father    Heart disease Father    Diverticulosis Father    Hypertension Father    Sudden death Father    Hypertension Sister    Diverticulosis Sister    Breast cancer Sister    Colon cancer Paternal Aunt    Crohn's disease Paternal Aunt    Colon cancer Paternal Aunt    Cancer Paternal Aunt        x 2, type unknown   Colon cancer Paternal Aunt    Heart disease Paternal Grandfather    Colon cancer Other        maternal great Aunt   Esophageal cancer Neg Hx    Stomach cancer Neg Hx    Rectal cancer Neg Hx    Colon polyps Neg Hx     Social History   Socioeconomic History   Marital status: Single    Spouse name: Not on file   Number of children: 0   Years of education:  20   Highest education level: Not on file  Occupational History   Occupation: retired/ Professor of Music  Tobacco Use   Smoking status: Never    Passive exposure: Past (Mother)   Smokeless tobacco: Never  Vaping Use   Vaping status: Never Used  Substance and Sexual Activity   Alcohol use: Yes    Alcohol/week: 6.0 standard drinks of alcohol    Types: 6 Glasses of wine per week    Comment: couple times per week.    Drug use: Never   Sexual activity: Never  Other Topics Concern   Not on file  Social History Narrative   Not on file   Social Drivers of Health   Financial Resource Strain: Not on file  Food Insecurity: Not on file  Transportation Needs: Not on file  Physical Activity: Not on file  Stress: Not on file  Social Connections: Not on file  Intimate Partner Violence: Not on file    Review of Systems: All other review of systems negative except as mentioned in the HPI.  Physical Exam: Vital signs BP 136/68   Pulse 71   Temp 97.9 F (36.6 C) (Temporal)   Ht 5\' 6"  (1.676 m)   Wt 210 lb (95.3 kg)   SpO2 96%   BMI 33.89 kg/m   General:   Alert,  Well-developed, pleasant and cooperative in NAD Lungs:  Clear throughout to auscultation.   Heart:  Regular rate and rhythm Abdomen:  Soft, nontender and nondistended.   Neuro/Psych:  Alert and cooperative. Normal mood and affect. A and O x 3  Harlin Rain, MD [facility]

## 2023-07-13 NOTE — Patient Instructions (Signed)
Discharge instructions given. Handouts on polyps,Diverticulosis and Hemorrhoids. Resume previous medications. YOU HAD AN ENDOSCOPIC PROCEDURE TODAY AT [facility]:   Refer to the procedure report that was given to you for any specific questions about what was found during the examination.  If the procedure report does not answer your questions, please call your gastroenterologist to clarify.  If you requested that your care partner not be given the details of your procedure findings, then the procedure report has been included in a sealed envelope for you to review at your convenience later.  YOU SHOULD EXPECT: Some feelings of bloating in the abdomen. Passage of more gas than usual.  Walking can help get rid of the air that was put into your GI tract during the procedure and reduce the bloating. If you had a lower endoscopy (such as a colonoscopy or flexible sigmoidoscopy) you may notice spotting of blood in your stool or on the toilet paper. If you underwent a bowel prep for your procedure, you may not have a normal bowel movement for a few days.  Please Note:  You might notice some irritation and congestion in your nose or some drainage.  This is from the oxygen used during your procedure.  There is no need for concern and it should clear up in a day or so.  SYMPTOMS TO REPORT IMMEDIATELY:  Following lower endoscopy (colonoscopy or flexible sigmoidoscopy):  Excessive amounts of blood in the stool  Significant tenderness or worsening of abdominal pains  Swelling of the abdomen that is new, acute  Fever of 100F or higher   For urgent or emergent issues, a gastroenterologist can be reached at any hour by calling [phone]. Do not use MyChart messaging for urgent concerns.    DIET:  We do recommend a small meal at first, but then you may proceed to your regular diet.  Drink plenty of fluids but you should avoid alcoholic beverages for 24 hours.  ACTIVITY:  You should  plan to take it easy for the rest of today and you should NOT DRIVE or use heavy machinery until tomorrow (because of the sedation medicines used during the test).    FOLLOW UP: Our staff will call the number listed on your records the next business day following your procedure.  We will call around 7:15- 8:00 am to check on you and address any questions or concerns that you may have regarding the information given to you following your procedure. If we do not reach you, we will leave a message.     If any biopsies were taken you will be contacted by phone or by letter within the next 1-3 weeks.  Please call us at [phone] if you have not heard about the biopsies in 3 weeks.    SIGNATURES/CONFIDENTIALITY: You and/or your care partner have signed paperwork which will be entered into your electronic medical record.  These signatures attest to the fact that that the information above on your After Visit Summary has been reviewed and is understood.  Full responsibility of the confidentiality of this discharge information lies with you and/or your care-partner.

## 2023-07-13 NOTE — Op Note (Signed)
[facility] Patient Name: [name] Procedure Date: 07/13/2023 11:33 AM MRN: [id number] Endoscopist: Viviann Spare P. Adela Lank , MD, [id number] Age: 72 Referring MD:  Date of Birth: [birth year] Gender: Female Account #: [id number] Procedure:                Colonoscopy Indications:              Screening for colorectal malignant neoplasm Medicines:                Monitored Anesthesia Care Procedure:                Pre-Anesthesia Assessment:                           - Prior to the procedure, a History and Physical                            was performed, and patient medications and                            allergies were reviewed. The patient's tolerance of                            previous anesthesia was also reviewed. The risks                            and benefits of the procedure and the sedation                            options and risks were discussed with the patient.                            All questions were answered, and informed consent                            was obtained. Prior Anticoagulants: The patient has                            taken no anticoagulant or antiplatelet agents. ASA                            Grade Assessment: III - A patient with severe                            systemic disease. After reviewing the risks and                            benefits, the patient was deemed in satisfactory                            condition to undergo the procedure.                           After obtaining informed consent, the colonoscope  was passed under direct vision. Throughout the                            procedure, the patient's blood pressure, pulse, and                            oxygen saturations were monitored continuously. The                            CF [id number] #[id number] was introduced through the anus                            and advanced to the the cecum, identified by                             appendiceal orifice and ileocecal valve. The                            colonoscopy was performed without difficulty. The                            patient tolerated the procedure well. The quality                            of the bowel preparation was adequate. The                            ileocecal valve, appendiceal orifice, and rectum                            were photographed. Scope In: 11:41:23 AM Scope Out: 12:01:51 PM Scope Withdrawal Time: 0 hours 17 minutes 9 seconds  Total Procedure Duration: 0 hours 20 minutes 28 seconds  Findings:                 Skin tags, one large, were found on perianal exam.                           Two sessile polyps were found in the hepatic                            flexure. The polyps were 3 mm in size. These polyps                            were removed with a cold snare. Resection and                            retrieval were complete.                           A 3 mm polyp was found in the transverse colon. The                            polyp was flat. The polyp  was removed with a cold                            snare. Resection and retrieval were complete.                           A 4 to 5 mm polyp was found in the transverse                            colon. The polyp was sessile. The polyp was removed                            with a cold snare. Resection and retrieval were                            complete.                           Multiple small-mouthed diverticula were found in                            the entire colon.                           Internal hemorrhoids were found during retroflexion.                           The exam was otherwise without abnormality. Complications:            No immediate complications. Estimated blood loss:                            Minimal. Estimated Blood Loss:     Estimated blood loss was minimal. Impression:               - Perianal skin tags found on perianal exam.                            - Two 3 mm polyps at the hepatic flexure, removed                            with a cold snare. Resected and retrieved.                           - One 3 mm polyp in the transverse colon, removed                            with a cold snare. Resected and retrieved.                           - One 4 to 5 mm polyp in the transverse colon,                            removed with a cold snare. Resected and retrieved.                           -  Diverticulosis in the entire examined colon.                           - Internal hemorrhoids.                           - The examination was otherwise normal. Recommendation:           - Patient has a contact number available for                            emergencies. The signs and symptoms of potential                            delayed complications were discussed with the                            patient. Return to normal activities tomorrow.                            Written discharge instructions were provided to the                            patient.                           - Resume previous diet.                           - Continue present medications.                           - Await pathology results. Viviann Spare P. Lynnea Vandervoort, MD 07/13/2023 12:07:10 PM This report has been signed electronically.

## 2023-07-13 NOTE — Progress Notes (Signed)
Pt's states no medical or surgical changes since previsit or office visit. 

## 2023-07-13 NOTE — Progress Notes (Signed)
Called to room to assist during endoscopic procedure.  Patient ID and intended procedure confirmed with present staff. Received instructions for my participation in the procedure from the performing physician.  

## 2023-07-16 ENCOUNTER — Telehealth: Payer: Self-pay

## 2023-07-16 DIAGNOSIS — M7541 Impingement syndrome of right shoulder: Secondary | ICD-10-CM | POA: Diagnosis not present

## 2023-07-16 NOTE — Telephone Encounter (Signed)
Follow up call to pt, lm for pt to call if having any difficulty with normal activities or eating and drinking.  Also to call if any other questions or concerns.  

## 2023-07-17 ENCOUNTER — Telehealth: Payer: Self-pay | Admitting: *Deleted

## 2023-07-17 LAB — SURGICAL PATHOLOGY

## 2023-07-17 NOTE — Telephone Encounter (Signed)
Sounds like rectal bloating was diminutive and stopped.  I think okay to take a few tablets of Imodium for her loose stools and hopefully this resolves with a bit more time.  if fevers or other concerning symptoms develop otherwise she can call us back.  Thanks

## 2023-07-17 NOTE — Telephone Encounter (Signed)
Returned the patient's phone call and told her she could use Immodium per Dr. Adela Lank for her watery stool If she should continue to have the bleeding to let us know.PT verbalized understanding.

## 2023-07-17 NOTE — Telephone Encounter (Signed)
PT returned call. Since procedure she still has runny bowels and blood in stool and very concerned. Please advise.

## 2023-07-17 NOTE — Telephone Encounter (Signed)
Spoke with patient.  Patient states that she has had rectal bleeding.  States about "a teaspoon yesterday."  States that she has had no bleeding today.  States that she has had "a lot of loose stool since Friday when  her procedure was done.  Patient has bee eating zucchini and fritos.  States that she has been "out and about today despite the loose stools. No fever. No pain noted.    Patient would like to know what to do regarding the loose stools.  She was told to stay on a bland diet today.  No fritos. No zucchini. Patient to call us if the bleeding starts again.   Note sent to Dr Adela Lank at this time.  Patient knows that we will call if anything else is recommended.

## 2023-07-20 DIAGNOSIS — M25511 Pain in right shoulder: Secondary | ICD-10-CM | POA: Diagnosis not present

## 2023-07-20 DIAGNOSIS — M79671 Pain in right foot: Secondary | ICD-10-CM | POA: Diagnosis not present

## 2023-07-20 DIAGNOSIS — M545 Low back pain, unspecified: Secondary | ICD-10-CM | POA: Diagnosis not present

## 2023-07-20 DIAGNOSIS — R269 Unspecified abnormalities of gait and mobility: Secondary | ICD-10-CM | POA: Diagnosis not present

## 2023-07-26 DIAGNOSIS — M25511 Pain in right shoulder: Secondary | ICD-10-CM | POA: Diagnosis not present

## 2023-07-27 DIAGNOSIS — M79671 Pain in right foot: Secondary | ICD-10-CM | POA: Diagnosis not present

## 2023-07-27 DIAGNOSIS — M545 Low back pain, unspecified: Secondary | ICD-10-CM | POA: Diagnosis not present

## 2023-07-27 DIAGNOSIS — R269 Unspecified abnormalities of gait and mobility: Secondary | ICD-10-CM | POA: Diagnosis not present

## 2023-07-27 DIAGNOSIS — M25511 Pain in right shoulder: Secondary | ICD-10-CM | POA: Diagnosis not present

## 2023-08-02 DIAGNOSIS — M79671 Pain in right foot: Secondary | ICD-10-CM | POA: Diagnosis not present

## 2023-08-02 DIAGNOSIS — R269 Unspecified abnormalities of gait and mobility: Secondary | ICD-10-CM | POA: Diagnosis not present

## 2023-08-02 DIAGNOSIS — M25511 Pain in right shoulder: Secondary | ICD-10-CM | POA: Diagnosis not present

## 2023-08-02 DIAGNOSIS — M545 Low back pain, unspecified: Secondary | ICD-10-CM | POA: Diagnosis not present

## 2023-08-03 ENCOUNTER — Ambulatory Visit
Admission: RE | Admit: 2023-08-03 | Discharge: 2023-08-03 | Disposition: A | Discharge status: Home / Self Care | Payer: Medicare PPO | Source: Ambulatory Visit | Attending: Family Medicine | Admitting: Family Medicine

## 2023-08-03 DIAGNOSIS — Z1231 Encounter for screening mammogram for malignant neoplasm of breast: Secondary | ICD-10-CM

## 2023-08-06 DIAGNOSIS — E1142 Type 2 diabetes mellitus with diabetic polyneuropathy: Secondary | ICD-10-CM | POA: Diagnosis not present

## 2023-08-06 DIAGNOSIS — R8290 Unspecified abnormal findings in urine: Secondary | ICD-10-CM | POA: Diagnosis not present

## 2023-08-06 DIAGNOSIS — L309 Dermatitis, unspecified: Secondary | ICD-10-CM | POA: Diagnosis not present

## 2023-08-08 ENCOUNTER — Other Ambulatory Visit: Payer: Self-pay | Admitting: Obstetrics & Gynecology

## 2023-08-08 DIAGNOSIS — R928 Other abnormal and inconclusive findings on diagnostic imaging of breast: Secondary | ICD-10-CM

## 2023-08-08 DIAGNOSIS — Z6836 Body mass index (BMI) 36.0-36.9, adult: Secondary | ICD-10-CM | POA: Diagnosis not present

## 2023-08-08 DIAGNOSIS — M47812 Spondylosis without myelopathy or radiculopathy, cervical region: Secondary | ICD-10-CM | POA: Diagnosis not present

## 2023-08-08 DIAGNOSIS — M25511 Pain in right shoulder: Secondary | ICD-10-CM | POA: Diagnosis not present

## 2023-08-10 DIAGNOSIS — M79671 Pain in right foot: Secondary | ICD-10-CM | POA: Diagnosis not present

## 2023-08-10 DIAGNOSIS — M25511 Pain in right shoulder: Secondary | ICD-10-CM | POA: Diagnosis not present

## 2023-08-10 DIAGNOSIS — R269 Unspecified abnormalities of gait and mobility: Secondary | ICD-10-CM | POA: Diagnosis not present

## 2023-08-10 DIAGNOSIS — M545 Low back pain, unspecified: Secondary | ICD-10-CM | POA: Diagnosis not present

## 2023-08-13 DIAGNOSIS — M25511 Pain in right shoulder: Secondary | ICD-10-CM | POA: Diagnosis not present

## 2023-08-20 DIAGNOSIS — M545 Low back pain, unspecified: Secondary | ICD-10-CM | POA: Diagnosis not present

## 2023-08-20 DIAGNOSIS — M25511 Pain in right shoulder: Secondary | ICD-10-CM | POA: Diagnosis not present

## 2023-08-20 DIAGNOSIS — M79671 Pain in right foot: Secondary | ICD-10-CM | POA: Diagnosis not present

## 2023-08-20 DIAGNOSIS — R269 Unspecified abnormalities of gait and mobility: Secondary | ICD-10-CM | POA: Diagnosis not present

## 2023-08-21 ENCOUNTER — Ambulatory Visit: Payer: Medicare PPO

## 2023-08-21 ENCOUNTER — Ambulatory Visit
Admission: RE | Admit: 2023-08-21 | Discharge: 2023-08-21 | Disposition: A | Discharge status: Home / Self Care | Payer: Medicare PPO | Source: Ambulatory Visit | Attending: Obstetrics & Gynecology | Admitting: Obstetrics & Gynecology

## 2023-08-21 DIAGNOSIS — R928 Other abnormal and inconclusive findings on diagnostic imaging of breast: Secondary | ICD-10-CM

## 2023-08-21 DIAGNOSIS — R92332 Mammographic heterogeneous density, left breast: Secondary | ICD-10-CM | POA: Diagnosis not present

## 2023-08-21 DIAGNOSIS — N6489 Other specified disorders of breast: Secondary | ICD-10-CM | POA: Diagnosis not present

## 2023-09-03 DIAGNOSIS — R269 Unspecified abnormalities of gait and mobility: Secondary | ICD-10-CM | POA: Diagnosis not present

## 2023-09-03 DIAGNOSIS — M25511 Pain in right shoulder: Secondary | ICD-10-CM | POA: Diagnosis not present

## 2023-09-03 DIAGNOSIS — M545 Low back pain, unspecified: Secondary | ICD-10-CM | POA: Diagnosis not present

## 2023-09-03 DIAGNOSIS — M79671 Pain in right foot: Secondary | ICD-10-CM | POA: Diagnosis not present

## 2023-09-06 DIAGNOSIS — M79671 Pain in right foot: Secondary | ICD-10-CM | POA: Diagnosis not present

## 2023-09-06 DIAGNOSIS — M545 Low back pain, unspecified: Secondary | ICD-10-CM | POA: Diagnosis not present

## 2023-09-06 DIAGNOSIS — M25511 Pain in right shoulder: Secondary | ICD-10-CM | POA: Diagnosis not present

## 2023-09-06 DIAGNOSIS — R269 Unspecified abnormalities of gait and mobility: Secondary | ICD-10-CM | POA: Diagnosis not present

## 2023-09-10 DIAGNOSIS — M79671 Pain in right foot: Secondary | ICD-10-CM | POA: Diagnosis not present

## 2023-09-10 DIAGNOSIS — M545 Low back pain, unspecified: Secondary | ICD-10-CM | POA: Diagnosis not present

## 2023-09-10 DIAGNOSIS — R269 Unspecified abnormalities of gait and mobility: Secondary | ICD-10-CM | POA: Diagnosis not present

## 2023-09-10 DIAGNOSIS — M25511 Pain in right shoulder: Secondary | ICD-10-CM | POA: Diagnosis not present

## 2023-09-12 DIAGNOSIS — G243 Spasmodic torticollis: Secondary | ICD-10-CM | POA: Diagnosis not present

## 2023-09-12 DIAGNOSIS — Z981 Arthrodesis status: Secondary | ICD-10-CM | POA: Diagnosis not present

## 2023-09-13 DIAGNOSIS — M79671 Pain in right foot: Secondary | ICD-10-CM | POA: Diagnosis not present

## 2023-09-13 DIAGNOSIS — M25511 Pain in right shoulder: Secondary | ICD-10-CM | POA: Diagnosis not present

## 2023-09-13 DIAGNOSIS — M545 Low back pain, unspecified: Secondary | ICD-10-CM | POA: Diagnosis not present

## 2023-09-13 DIAGNOSIS — R269 Unspecified abnormalities of gait and mobility: Secondary | ICD-10-CM | POA: Diagnosis not present

## 2023-09-17 DIAGNOSIS — M79671 Pain in right foot: Secondary | ICD-10-CM | POA: Diagnosis not present

## 2023-09-17 DIAGNOSIS — M25511 Pain in right shoulder: Secondary | ICD-10-CM | POA: Diagnosis not present

## 2023-09-17 DIAGNOSIS — R269 Unspecified abnormalities of gait and mobility: Secondary | ICD-10-CM | POA: Diagnosis not present

## 2023-09-17 DIAGNOSIS — M545 Low back pain, unspecified: Secondary | ICD-10-CM | POA: Diagnosis not present

## 2023-09-19 DIAGNOSIS — R269 Unspecified abnormalities of gait and mobility: Secondary | ICD-10-CM | POA: Diagnosis not present

## 2023-09-19 DIAGNOSIS — M545 Low back pain, unspecified: Secondary | ICD-10-CM | POA: Diagnosis not present

## 2023-09-19 DIAGNOSIS — M79671 Pain in right foot: Secondary | ICD-10-CM | POA: Diagnosis not present

## 2023-09-19 DIAGNOSIS — M25511 Pain in right shoulder: Secondary | ICD-10-CM | POA: Diagnosis not present

## 2023-09-28 DIAGNOSIS — M79671 Pain in right foot: Secondary | ICD-10-CM | POA: Diagnosis not present

## 2023-09-28 DIAGNOSIS — R269 Unspecified abnormalities of gait and mobility: Secondary | ICD-10-CM | POA: Diagnosis not present

## 2023-09-28 DIAGNOSIS — M25511 Pain in right shoulder: Secondary | ICD-10-CM | POA: Diagnosis not present

## 2023-09-28 DIAGNOSIS — M545 Low back pain, unspecified: Secondary | ICD-10-CM | POA: Diagnosis not present

## 2023-10-01 DIAGNOSIS — M79671 Pain in right foot: Secondary | ICD-10-CM | POA: Diagnosis not present

## 2023-10-01 DIAGNOSIS — M25511 Pain in right shoulder: Secondary | ICD-10-CM | POA: Diagnosis not present

## 2023-10-01 DIAGNOSIS — M545 Low back pain, unspecified: Secondary | ICD-10-CM | POA: Diagnosis not present

## 2023-10-01 DIAGNOSIS — R269 Unspecified abnormalities of gait and mobility: Secondary | ICD-10-CM | POA: Diagnosis not present

## 2023-10-04 DIAGNOSIS — R269 Unspecified abnormalities of gait and mobility: Secondary | ICD-10-CM | POA: Diagnosis not present

## 2023-10-04 DIAGNOSIS — M25511 Pain in right shoulder: Secondary | ICD-10-CM | POA: Diagnosis not present

## 2023-10-04 DIAGNOSIS — M545 Low back pain, unspecified: Secondary | ICD-10-CM | POA: Diagnosis not present

## 2023-10-04 DIAGNOSIS — M79671 Pain in right foot: Secondary | ICD-10-CM | POA: Diagnosis not present

## 2023-10-10 DIAGNOSIS — M545 Low back pain, unspecified: Secondary | ICD-10-CM | POA: Diagnosis not present

## 2023-10-10 DIAGNOSIS — M25511 Pain in right shoulder: Secondary | ICD-10-CM | POA: Diagnosis not present

## 2023-10-10 DIAGNOSIS — M79671 Pain in right foot: Secondary | ICD-10-CM | POA: Diagnosis not present

## 2023-10-10 DIAGNOSIS — R269 Unspecified abnormalities of gait and mobility: Secondary | ICD-10-CM | POA: Diagnosis not present

## 2023-10-12 DIAGNOSIS — M79671 Pain in right foot: Secondary | ICD-10-CM | POA: Diagnosis not present

## 2023-10-12 DIAGNOSIS — M25511 Pain in right shoulder: Secondary | ICD-10-CM | POA: Diagnosis not present

## 2023-10-12 DIAGNOSIS — M545 Low back pain, unspecified: Secondary | ICD-10-CM | POA: Diagnosis not present

## 2023-10-12 DIAGNOSIS — R269 Unspecified abnormalities of gait and mobility: Secondary | ICD-10-CM | POA: Diagnosis not present

## 2023-10-16 DIAGNOSIS — M545 Low back pain, unspecified: Secondary | ICD-10-CM | POA: Diagnosis not present

## 2023-10-16 DIAGNOSIS — M25511 Pain in right shoulder: Secondary | ICD-10-CM | POA: Diagnosis not present

## 2023-10-16 DIAGNOSIS — R269 Unspecified abnormalities of gait and mobility: Secondary | ICD-10-CM | POA: Diagnosis not present

## 2023-10-16 DIAGNOSIS — M79671 Pain in right foot: Secondary | ICD-10-CM | POA: Diagnosis not present

## 2023-10-17 DIAGNOSIS — M25511 Pain in right shoulder: Secondary | ICD-10-CM | POA: Diagnosis not present

## 2023-10-19 DIAGNOSIS — M79671 Pain in right foot: Secondary | ICD-10-CM | POA: Diagnosis not present

## 2023-10-19 DIAGNOSIS — M545 Low back pain, unspecified: Secondary | ICD-10-CM | POA: Diagnosis not present

## 2023-10-19 DIAGNOSIS — R269 Unspecified abnormalities of gait and mobility: Secondary | ICD-10-CM | POA: Diagnosis not present

## 2023-10-19 DIAGNOSIS — M25511 Pain in right shoulder: Secondary | ICD-10-CM | POA: Diagnosis not present

## 2023-10-28 ENCOUNTER — Other Ambulatory Visit: Payer: Self-pay | Admitting: Cardiology

## 2023-11-02 DIAGNOSIS — M545 Low back pain, unspecified: Secondary | ICD-10-CM | POA: Diagnosis not present

## 2023-11-02 DIAGNOSIS — M25511 Pain in right shoulder: Secondary | ICD-10-CM | POA: Diagnosis not present

## 2023-11-02 DIAGNOSIS — M79671 Pain in right foot: Secondary | ICD-10-CM | POA: Diagnosis not present

## 2023-11-02 DIAGNOSIS — R269 Unspecified abnormalities of gait and mobility: Secondary | ICD-10-CM | POA: Diagnosis not present

## 2023-11-05 DIAGNOSIS — Z6837 Body mass index (BMI) 37.0-37.9, adult: Secondary | ICD-10-CM | POA: Diagnosis not present

## 2023-11-05 DIAGNOSIS — L237 Allergic contact dermatitis due to plants, except food: Secondary | ICD-10-CM | POA: Diagnosis not present

## 2023-11-20 DIAGNOSIS — M79671 Pain in right foot: Secondary | ICD-10-CM | POA: Diagnosis not present

## 2023-11-20 DIAGNOSIS — R269 Unspecified abnormalities of gait and mobility: Secondary | ICD-10-CM | POA: Diagnosis not present

## 2023-11-20 DIAGNOSIS — M25511 Pain in right shoulder: Secondary | ICD-10-CM | POA: Diagnosis not present

## 2023-11-20 DIAGNOSIS — M545 Low back pain, unspecified: Secondary | ICD-10-CM | POA: Diagnosis not present

## 2023-12-05 DIAGNOSIS — M25511 Pain in right shoulder: Secondary | ICD-10-CM | POA: Diagnosis not present

## 2023-12-07 DIAGNOSIS — F422 Mixed obsessional thoughts and acts: Secondary | ICD-10-CM | POA: Diagnosis not present

## 2023-12-07 DIAGNOSIS — F3341 Major depressive disorder, recurrent, in partial remission: Secondary | ICD-10-CM | POA: Diagnosis not present

## 2023-12-07 DIAGNOSIS — F410 Panic disorder [episodic paroxysmal anxiety] without agoraphobia: Secondary | ICD-10-CM | POA: Diagnosis not present

## 2023-12-10 DIAGNOSIS — W57XXXA Bitten or stung by nonvenomous insect and other nonvenomous arthropods, initial encounter: Secondary | ICD-10-CM | POA: Diagnosis not present

## 2023-12-10 DIAGNOSIS — S70362A Insect bite (nonvenomous), left thigh, initial encounter: Secondary | ICD-10-CM | POA: Diagnosis not present

## 2024-01-07 DIAGNOSIS — Z79899 Other long term (current) drug therapy: Secondary | ICD-10-CM | POA: Diagnosis not present

## 2024-01-07 DIAGNOSIS — F422 Mixed obsessional thoughts and acts: Secondary | ICD-10-CM | POA: Diagnosis not present

## 2024-01-07 DIAGNOSIS — F331 Major depressive disorder, recurrent, moderate: Secondary | ICD-10-CM | POA: Diagnosis not present

## 2024-01-07 DIAGNOSIS — F410 Panic disorder [episodic paroxysmal anxiety] without agoraphobia: Secondary | ICD-10-CM | POA: Diagnosis not present

## 2024-01-07 DIAGNOSIS — Z5181 Encounter for therapeutic drug level monitoring: Secondary | ICD-10-CM | POA: Diagnosis not present

## 2024-01-07 DIAGNOSIS — F3341 Major depressive disorder, recurrent, in partial remission: Secondary | ICD-10-CM | POA: Diagnosis not present

## 2024-01-11 ENCOUNTER — Telehealth: Payer: Self-pay | Admitting: Cardiology

## 2024-01-11 DIAGNOSIS — I872 Venous insufficiency (chronic) (peripheral): Secondary | ICD-10-CM | POA: Diagnosis not present

## 2024-01-11 DIAGNOSIS — D2262 Melanocytic nevi of left upper limb, including shoulder: Secondary | ICD-10-CM | POA: Diagnosis not present

## 2024-01-11 DIAGNOSIS — D225 Melanocytic nevi of trunk: Secondary | ICD-10-CM | POA: Diagnosis not present

## 2024-01-11 DIAGNOSIS — L218 Other seborrheic dermatitis: Secondary | ICD-10-CM | POA: Diagnosis not present

## 2024-01-11 DIAGNOSIS — I8312 Varicose veins of left lower extremity with inflammation: Secondary | ICD-10-CM | POA: Diagnosis not present

## 2024-01-11 DIAGNOSIS — L720 Epidermal cyst: Secondary | ICD-10-CM | POA: Diagnosis not present

## 2024-01-11 DIAGNOSIS — I8311 Varicose veins of right lower extremity with inflammation: Secondary | ICD-10-CM | POA: Diagnosis not present

## 2024-01-11 DIAGNOSIS — L821 Other seborrheic keratosis: Secondary | ICD-10-CM | POA: Diagnosis not present

## 2024-01-11 DIAGNOSIS — D2261 Melanocytic nevi of right upper limb, including shoulder: Secondary | ICD-10-CM | POA: Diagnosis not present

## 2024-01-11 NOTE — Telephone Encounter (Signed)
 Spoke to patient she stated she is having swelling in both lower legs,rt is worse for the past 3 weeks.Sob with exertion.No chest pain.Advised to avoid salt.Elevate legs when sitting.Wear compression hose during the day.Appointment scheduled with Dr.Turner 6/18 at 11:40 am.

## 2024-01-11 NOTE — Telephone Encounter (Signed)
 Pt c/o swelling/edema: STAT if pt has developed SOB within 24 hours  If swelling, where is the swelling located? Legs   How much weight have you gained and in what time span? Not sure   Have you gained 2 pounds in a day or 5 pounds in a week? Not sure   Do you have a log of your daily weights (if so, list)? Has not logged   Are you currently taking a fluid pill? No   Are you currently SOB? No   Have you traveled recently in a car or plane for an extended period of time? No

## 2024-01-16 ENCOUNTER — Ambulatory Visit (HOSPITAL_COMMUNITY)
Admission: RE | Admit: 2024-01-16 | Discharge: 2024-01-16 | Disposition: A | Discharge status: Home / Self Care | Source: Ambulatory Visit | Attending: Cardiology | Admitting: Cardiology

## 2024-01-16 ENCOUNTER — Telehealth: Payer: Self-pay | Admitting: Cardiology

## 2024-01-16 ENCOUNTER — Ambulatory Visit: Attending: Cardiology | Admitting: Cardiology

## 2024-01-16 ENCOUNTER — Ambulatory Visit: Payer: Self-pay | Admitting: Cardiology

## 2024-01-16 VITALS — BP 148/86 | HR 80 | Ht 66.0 in | Wt 232.0 lb

## 2024-01-16 DIAGNOSIS — M7989 Other specified soft tissue disorders: Secondary | ICD-10-CM

## 2024-01-16 DIAGNOSIS — I10 Essential (primary) hypertension: Secondary | ICD-10-CM | POA: Diagnosis not present

## 2024-01-16 DIAGNOSIS — G4733 Obstructive sleep apnea (adult) (pediatric): Secondary | ICD-10-CM

## 2024-01-16 NOTE — Telephone Encounter (Signed)
 Patient calling to see if she suppose to get a prescription for getting support hose from the medical store. Please advise

## 2024-01-16 NOTE — Progress Notes (Signed)
 Date:  01/16/2024   ID:  [name], DOB Oct 31, 1950, MRN [id number]   PCP:  Perley Bradley, MD   Chief Complaint:  OSA/CAD/HTN/Obesity  History of Present Illness:    [name] is a 73 y.o. female  with a hx of nonobstructive ASCAD with 20% OM, HTN, dyslipidemia, vasovagal syncope and OSA on CPAP.    At last office visit her device was making a lot of noise and it was over 10 years old so we ordered a new CPAP device.  She is back for follow-up per insurance requirements to document compliance  She is doing well with her PAP device and thinks that she has gotten used to it.  SHe tolerates the mask and feels the pressure is adequate.  Since going on PAP she feels rested in the am and has no significant daytime sleepiness.  She denies any significant mouth or nasal dryness or nasal congestion.  She does not think that he snores.     She is here today for followup and is doing well.  She denies any chest pain or pressure, SOB, DOE, PND, orthopnea, LE edema, dizziness, palpitations or syncope. She is compliant with her meds and is tolerating meds with no SE.    Prior CV studies:   The following studies were reviewed today:  PAP compliance download  Past Medical History:  Diagnosis Date   Anxiety    Arthritis    Atherosclerosis    Back pain    Chronic fatigue    and weakness   Colon polyps    Constipation    Coronary artery disease    nonobstructive with 20% OM by cath 2006   Depression    Diabetes mellitus without complication (HCC)    type 2    Diastolic dysfunction    w Elevated LVEDP   DJD (degenerative joint disease)    C3/4 Dr Cipriano Creeks   Edema, lower extremity    GERD (gastroesophageal reflux disease)    High cholesterol    History of kidney stones    Hypertension    Hypoglycemia    Kidney stones    Neurocardiogenic syncope    Obesity    Osteoarthritis    PONV (postoperative nausea and vomiting)    Prediabetes 2011 & 2012   Right knee pain  12/2009   Dr Rozelle Corning   Sleep apnea    CPAP had Sleep Study ordered by Dr. Micael Adas    Tick bite 11/2016   Vasovagal syncope    Past Surgical History:  Procedure Laterality Date   ACHILLES TENDON SURGERY Left 01/23/2013   Procedure: EXCISION PARTIAL BONE TALUS/CALCANEUS, REPAIR RUPTURE ACHILLES TENDON PRIMARY OPEN ;  Surgeon: Ferd Householder, MD;  Location: [facility];  Service: Orthopedics;  Laterality: Left;   ANTERIOR FUSION CERVICAL SPINE  2000, 2002   x2-cervical   BREAST BIOPSY Right 2007   BREAST CYST EXCISION Left 2007   CARDIAC CATHETERIZATION  ~ 2008   Nonobstructive ASCAD, 20% OM   CARPAL TUNNEL RELEASE Left 2002   COLONOSCOPY     CYSTOSCOPY W/ URETERAL STENT PLACEMENT Left 12/23/2019   Procedure: CYSTOSCOPY WITH RETROGRADE PYELOGRAM/URETERAL STENT PLACEMENT;  Surgeon: Andrez Banker, MD;  Location: [facility];  Service: Urology;  Laterality: Left;   CYSTOSCOPY/URETEROSCOPY/HOLMIUM LASER/STENT PLACEMENT Left 01/05/2020   Procedure: CYSTOSCOPY LEFT URETEROSCOPY/HOLMIUM LASER/STENT EXCHANGE;  Surgeon: Homero Luster, MD;  Location: [facility];  Service: Urology;  Laterality: Left;   EXTRACORPOREAL SHOCK WAVE LITHOTRIPSY Left 12/22/2019  Procedure: LEFT EXTRACORPOREAL SHOCK WAVE LITHOTRIPSY (ESWL);  Surgeon: Andrez Banker, MD;  Location: [facility];  Service: Urology;  Laterality: Left;   LITHOTRIPSY     OTHER SURGICAL HISTORY  06/12/2018   Left big toe nail surgery. [facility]    TONSILLECTOMY     TOTAL KNEE ARTHROPLASTY Right 11/2010   TOTAL KNEE ARTHROPLASTY  12/13/2011   Procedure: TOTAL KNEE ARTHROPLASTY;  Surgeon: Ferd Householder, MD;  Location: [facility];  Service: Orthopedics;  Laterality: Left;  DR MURPHY WANTS 90 MINUTES FOR THIS CASE   UPPER GASTROINTESTINAL ENDOSCOPY       Current Meds  Medication Sig   acetaminophen  (TYLENOL ) 650 MG CR tablet 2 tablets Orally as needed As needed   aspirin  81 MG tablet Take 81 mg by mouth  daily.   atorvastatin  (LIPITOR) 80 MG tablet Take 1 tablet (80 mg total) by mouth daily.   calcium  carbonate (OSCAL) 1500 (600 Ca) MG TABS tablet Take 600 mg of elemental calcium  by mouth daily with breakfast.   celecoxib (CELEBREX) 200 MG capsule TAKE 1 CAPSULE BY MOUTH EVERY DAY WITH FOOD   Cholecalciferol (VITAMIN D3 PO) Take 2,000 Units by mouth daily.   clonazePAM  (KLONOPIN ) 0.5 MG tablet Take 0.5 mg by mouth daily.    Cyanocobalamin  1000 MCG CAPS 1 tablet Orally Once a day   desvenlafaxine  (PRISTIQ ) 50 MG 24 hr tablet Take 25 mg by mouth daily.  (Patient taking differently: Take 100 mg by mouth daily.)   esomeprazole  (NEXIUM ) 20 MG capsule Take 1 capsule (20 mg total) by mouth daily. Please schedule a yearly follow up for further refills. Thank you.   ezetimibe  (ZETIA ) 10 MG tablet Take 1 tablet (10 mg total) by mouth daily. Please call [phone] to schedule an overdue appointment for future refills. Thank you. 1st attempt.   FERROUS SULFATE PO Take 65 mg by mouth daily.   gabapentin  (NEURONTIN ) 100 MG capsule Take 1 or 2 three times a day   lisinopril  (ZESTRIL ) 10 MG tablet Take 1 tablet (10 mg total) by mouth every morning.   losartan (COZAAR) 25 MG tablet Take 25 mg by mouth daily.   metFORMIN (GLUCOPHAGE) 500 MG tablet Take 500 mg by mouth 2 (two) times daily with a meal.    Multiple Vitamins-Minerals (PRESERVISION AREDS 2 PO) Take 1 capsule by mouth daily.   NON FORMULARY CPAP   Omega-3 Fatty Acids (FISH OIL) 1200 MG CAPS Take 3 capsules by mouth daily.   traZODone  (DESYREL ) 50 MG tablet Take 150 mg by mouth at bedtime.   vitamin B-12 (CYANOCOBALAMIN ) 1000 MCG tablet 1 tablet     Allergies:   Guaifenesin & derivatives, Morphine  and codeine, Penicillins, Tramadol , Hydrocodone-acetaminophen , Meperidine  hcl, and Oxycodone  hcl   Social History   Tobacco Use   Smoking status: Never    Passive exposure: Past (Mother)   Smokeless tobacco: Never  Vaping Use   Vaping status: Never  Used  Substance Use Topics   Alcohol use: Yes    Alcohol/week: 6.0 standard drinks of alcohol    Types: 6 Glasses of wine per week    Comment: couple times per week.   Drug use: Never     Family Hx: The patient's family history includes Breast cancer in her sister; CAD in her father; CVA in her mother; Cancer in her paternal aunt; Cancer - Prostate in her father; Colon cancer in her paternal aunt, paternal aunt, paternal aunt and another family member; Crohn's  disease in her paternal aunt; Depression in her mother; Diverticulosis in her father, mother, and sister; Heart disease in her father and paternal grandfather; Hypertension in her father, mother, and sister; Kidney disease in her mother; Sudden death in her father. There is no history of Esophageal cancer, Stomach cancer, Rectal cancer, or Colon polyps.  ROS:   Please see the history of present illness.     All other systems reviewed and are negative.   Labs/Other Tests and Data Reviewed:    Recent Labs: No results found for requested labs within last 365 days.   Recent Lipid Panel Lab Results  Component Value Date/Time   CHOL 108 01/09/2022 12:40 PM   TRIG 116 01/09/2022 12:40 PM   HDL 42 01/09/2022 12:40 PM   CHOLHDL 2.6 01/09/2022 12:40 PM   CHOLHDL 3.9 08/05/2015 11:49 AM   LDLCALC 45 01/09/2022 12:40 PM   LDLDIRECT 93.3 09/17/2013 01:07 PM    Wt Readings from Last 3 Encounters:  01/16/24 232 lb (105.2 kg)  07/13/23 210 lb (95.3 kg)  06/19/23 210 lb (95.3 kg)     Objective:    Vital Signs:  BP (!) 148/86 (BP Location: Left Arm)   Pulse 80   Ht 5' 6 (1.676 m)   Wt 232 lb (105.2 kg)   SpO2 94%   BMI 37.45 kg/m    GEN: Well nourished, well developed in no acute distress HEENT: Normal NECK: No JVD; No carotid bruits LYMPHATICS: No lymphadenopathy CARDIAC:RRR, no murmurs, rubs, gallops RESPIRATORY:  Clear to auscultation without rales, wheezing or rhonchi  ABDOMEN: Soft, non-tender,  non-distended MUSCULOSKELETAL:  trace edema with RLE>LLE; No deformity  SKIN: Warm and dry NEUROLOGIC:  Alert and oriented x 3 PSYCHIATRIC:  Normal affect  ASSESSMENT & PLAN:    #OSA - The patient is tolerating PAP therapy well without any problems. The PAP download performed by his DME was personally reviewed and interpreted by me today and showed an AHI of 1.7 /hr on 11 cm H2O with 97% compliance in using more than 4 hours nightly.  The patient has been using and benefiting from PAP use and will continue to benefit from therapy.     #Hypertension  - BP controlled on exam today - Continue losartan 25 mg daily with as needed refills - I will get a copy of last BMP from PCP  #Nonobstructive ASCAD  -cath in 2006 showed 20% OM.   -She has chronic shortness of breath with Inclines but this is very stable and felt related to obesity and sedentary state -Lexiscan  myoview  12/2018 showed no ischemia -continue ASA and statin   #Hyperlipidemia  - her LDL goal is < 70.  - I will get a copy of her FLP and ALT from PCP - Continue prescription management with atorvastatin  80 mg daily and Zetia  10 mg daily with as needed refills  #Chronic LE edema -likely related to obesity and sedentary state as well as chronic venous insufficiency -encouraged her to cut out all added Na in her diet and use Mrs. Dash -her right leg is larger than the left today so I will get LE venous doppler to rule out DVT     Medication Adjustments/Labs and Tests Ordered: Current medicines are reviewed at length with the patient today.  Concerns regarding medicines are outlined above.  Tests Ordered: Orders Placed This Encounter  Procedures   EKG 12-Lead    Medication Changes: No orders of the defined types were placed in this encounter.  Disposition:  Follow up in 1 year(s)  Signed, Gaylyn Keas, MD  01/16/2024 12:05 PM    [facility]

## 2024-01-16 NOTE — Patient Instructions (Signed)
 Medication Instructions:  Your physician recommends that you continue on your current medications as directed. Please refer to the Current Medication list given to you today.  *If you need a refill on your cardiac medications before your next appointment, please call your pharmacy*  Lab Work: Please request that your PCP send your most recent CMET (complete metabolic panel) and Fasting lipid panel to our fax #: [phone].  If you have labs (blood work) drawn today and your tests are completely normal, you will receive your results only by: MyChart Message (if you have MyChart) OR A paper copy in the mail If you have any lab test that is abnormal or we need to change your treatment, we will call you to review the results.  Testing/Procedures: Your physician has requested that you have a lower or upper extremity venous duplex. This test is an ultrasound of the veins in the legs or arms. It looks at venous blood flow that carries blood from the heart to the legs or arms. Allow one hour for a Lower Venous exam. Allow thirty minutes for an Upper Venous exam. There are no restrictions or special instructions.  Please note: We ask at that you not bring children with you during ultrasound (echo/ vascular) testing. Due to room size and safety concerns, children are not allowed in the ultrasound rooms during exams. Our front office staff cannot provide observation of children in our lobby area while testing is being conducted. An adult accompanying a patient to their appointment will only be allowed in the ultrasound room at the discretion of the ultrasound technician under special circumstances. We apologize for any inconvenience.   Follow-Up: At [facility], you and your health needs are our priority.  As part of our continuing mission to provide you with exceptional heart care, our providers are all part of one team.  This team includes your primary Cardiologist (physician) and Advanced  Practice Providers or APPs (Physician Assistants and Nurse Practitioners) who all work together to provide you with the care you need, when you need it.  Your next appointment:   1 year(s)  Provider:   Gaylyn Keas, MD    We recommend signing up for the patient portal called MyChart.  Sign up information is provided on this After Visit Summary.  MyChart is used to connect with patients for Virtual Visits (Telemedicine).  Patients are able to view lab/test results, encounter notes, upcoming appointments, etc.  Non-urgent messages can be sent to your provider as well.   To learn more about what you can do with MyChart, go to [email or web address].

## 2024-01-16 NOTE — Addendum Note (Signed)
 Addended by: Marybeth Smock L on: 01/16/2024 12:12 PM   Modules accepted: Orders

## 2024-01-17 NOTE — Telephone Encounter (Signed)
 Pt calling to get update on status of compression hose, requesting cb

## 2024-01-22 ENCOUNTER — Ambulatory Visit: Admitting: Cardiology

## 2024-01-22 DIAGNOSIS — G4733 Obstructive sleep apnea (adult) (pediatric): Secondary | ICD-10-CM | POA: Diagnosis not present

## 2024-02-01 DIAGNOSIS — S30861A Insect bite (nonvenomous) of abdominal wall, initial encounter: Secondary | ICD-10-CM | POA: Diagnosis not present

## 2024-02-01 DIAGNOSIS — W57XXXA Bitten or stung by nonvenomous insect and other nonvenomous arthropods, initial encounter: Secondary | ICD-10-CM | POA: Diagnosis not present

## 2024-02-01 DIAGNOSIS — L089 Local infection of the skin and subcutaneous tissue, unspecified: Secondary | ICD-10-CM | POA: Diagnosis not present

## 2024-02-04 DIAGNOSIS — F410 Panic disorder [episodic paroxysmal anxiety] without agoraphobia: Secondary | ICD-10-CM | POA: Diagnosis not present

## 2024-02-04 DIAGNOSIS — F422 Mixed obsessional thoughts and acts: Secondary | ICD-10-CM | POA: Diagnosis not present

## 2024-02-04 DIAGNOSIS — F331 Major depressive disorder, recurrent, moderate: Secondary | ICD-10-CM | POA: Diagnosis not present

## 2024-02-23 ENCOUNTER — Other Ambulatory Visit: Payer: Self-pay | Admitting: Neurology

## 2024-02-25 NOTE — Telephone Encounter (Signed)
 Last seen on 02/13/23 No follow up scheduled   Rx denied pt needs updated visit.

## 2024-03-04 DIAGNOSIS — R413 Other amnesia: Secondary | ICD-10-CM | POA: Diagnosis not present

## 2024-03-04 DIAGNOSIS — E1142 Type 2 diabetes mellitus with diabetic polyneuropathy: Secondary | ICD-10-CM | POA: Diagnosis not present

## 2024-03-04 DIAGNOSIS — Z6836 Body mass index (BMI) 36.0-36.9, adult: Secondary | ICD-10-CM | POA: Diagnosis not present

## 2024-03-04 DIAGNOSIS — E7800 Pure hypercholesterolemia, unspecified: Secondary | ICD-10-CM | POA: Diagnosis not present

## 2024-03-04 DIAGNOSIS — I10 Essential (primary) hypertension: Secondary | ICD-10-CM | POA: Diagnosis not present

## 2024-03-04 DIAGNOSIS — R600 Localized edema: Secondary | ICD-10-CM | POA: Diagnosis not present

## 2024-03-04 DIAGNOSIS — I2510 Atherosclerotic heart disease of native coronary artery without angina pectoris: Secondary | ICD-10-CM | POA: Diagnosis not present

## 2024-03-04 DIAGNOSIS — G629 Polyneuropathy, unspecified: Secondary | ICD-10-CM | POA: Diagnosis not present

## 2024-03-05 DIAGNOSIS — E1142 Type 2 diabetes mellitus with diabetic polyneuropathy: Secondary | ICD-10-CM | POA: Diagnosis not present

## 2024-03-06 ENCOUNTER — Telehealth: Payer: Self-pay

## 2024-03-06 DIAGNOSIS — E7800 Pure hypercholesterolemia, unspecified: Secondary | ICD-10-CM

## 2024-03-06 NOTE — Telephone Encounter (Signed)
 Call to patient to discuss referral to lipid clinic, patient accepts, order placed.

## 2024-03-06 NOTE — Telephone Encounter (Signed)
-----   Message from Wilbert Bihari sent at 03/06/2024  9:26 AM EDT ----- Lipids are not at goal please get appointment with Pharm.D. ----- Message ----- From: Smitty Jonel CROME Sent: 03/05/2024   2:07 PM EDT To: Wilbert JONELLE Bihari, MD; Geni CROME Sar, RN

## 2024-03-25 ENCOUNTER — Ambulatory Visit (HOSPITAL_BASED_OUTPATIENT_CLINIC_OR_DEPARTMENT_OTHER): Admitting: Pharmacist Clinician (PhC)/ Clinical Pharmacy Specialist

## 2024-03-25 VITALS — BP 110/70 | HR 64 | Ht 66.0 in | Wt 222.0 lb

## 2024-03-25 DIAGNOSIS — E7800 Pure hypercholesterolemia, unspecified: Secondary | ICD-10-CM

## 2024-03-25 NOTE — Patient Instructions (Signed)
 Your Results:             Your most recent labs Goal  Total Cholesterol 173 < 200  Triglycerides 180 < 150  HDL (happy/good cholesterol) 44 > 40  LDL (lousy/bad cholesterol 93 < 55   Medication changes:  We will start the process to get Repatha  covered by your insurance.  Once the prior authorization is complete, I will call/send a MyChart message to let you know and confirm pharmacy information.   You will take one injection every 14 days  Lab orders:  We want to repeat labs after 2-3 months.  We will send you a lab order to remind you once we get closer to that time.    Patient Assistance:    We will sign you up for a Healthwell Grant once your medication is approved by [employer].  I will call you with the ID number, then you will take this information to the pharmacy.  They will bill it after your insurance, bringing your copay to $0.  The grant will pay the first $2,500 in a one year period.    ID   BIN [id number]  PCN [id number]  GRP [id number]    Thank you for choosing [facility]

## 2024-03-25 NOTE — Progress Notes (Signed)
 Office Visit    Patient Name: [name] Date of Encounter: 03/27/2024  Primary Care Provider:  Cleotilde Planas, MD Primary Cardiologist:  Wilbert Bihari, MD  Chief Complaint    Hyperlipidemia   Significant Past Medical History   CAD 20% OM stenosis  HTN Controlled on losartan 25  DM2 8/25 A1c 7.4  OSA On CPAP  LEE Chronic venous insufficiency, obesity, sedentary      Allergies  Allergen Reactions   Guaifenesin & Derivatives Itching   Morphine  And Codeine Itching    Hydrocodone and oxycodone    Penicillins Itching   Tramadol  Itching   Hydrocodone-Acetaminophen  Itching   Meperidine  Hcl Itching   Oxycodone  Hcl Itching    History of Present Illness    [name] is a 73 y.o. female patient of Dr Bihari, in the office today to discuss options for cholesterol management.  Insurance Carrier: Medicare/[employer]  Healthwell:  yes    LDL Cholesterol goal:  LDL < 55  Current Medications:  atorvastatin  80 mg daily, ezetimibe  10 mg daily   Family Hx:   father had heart disease, htn, mother with htn, stroke, CKD, sister with htn  Social Hx: Tobacco: no Alcohol: was drinking wine most days, has cut back      Diet:    cheerios with almonds for breakfast, variety of proteins, vegetables  Accessory Clinical Findings   In [facility]: 8/25  TC 173, TG 180, HDL 44, LDL 93 (calculated)    Lab Results  Component Value Date   CHOL 108 01/09/2022   HDL 42 01/09/2022   LDLCALC 45 01/09/2022   LDLDIRECT 93.3 09/17/2013   TRIG 116 01/09/2022   CHOLHDL 2.6 01/09/2022    No results found for: LIPOA  Lab Results  Component Value Date   ALT 26 01/09/2022   AST 13 02/03/2020   ALKPHOS 101 02/03/2020   BILITOT 0.2 02/03/2020   Lab Results  Component Value Date   CREATININE 0.90 02/03/2020   BUN 18 02/03/2020   NA 145 (H) 02/03/2020   K 4.2 02/03/2020   CL 106 02/03/2020   CO2 24 02/03/2020   Lab Results  Component Value Date   HGBA1C 6.4 (H)  02/03/2020    Home Medications    Current Outpatient Medications  Medication Sig Dispense Refill   acetaminophen  (TYLENOL ) 650 MG CR tablet 2 tablets Orally as needed As needed     aspirin  81 MG tablet Take 81 mg by mouth daily.     atorvastatin  (LIPITOR) 80 MG tablet Take 1 tablet (80 mg total) by mouth daily. 90 tablet 3   calcium  carbonate (OSCAL) 1500 (600 Ca) MG TABS tablet Take 600 mg of elemental calcium  by mouth daily with breakfast.     celecoxib (CELEBREX) 200 MG capsule TAKE 1 CAPSULE BY MOUTH EVERY DAY WITH FOOD     Cholecalciferol (VITAMIN D3 PO) Take 2,000 Units by mouth daily.     clonazePAM  (KLONOPIN ) 0.5 MG tablet Take 0.5 mg by mouth daily.   2   Cyanocobalamin  1000 MCG CAPS 1 tablet Orally Once a day     desvenlafaxine  (PRISTIQ ) 50 MG 24 hr tablet Take 25 mg by mouth daily.  (Patient taking differently: Take 100 mg by mouth daily.)     esomeprazole  (NEXIUM ) 20 MG capsule Take 1 capsule (20 mg total) by mouth daily. Please schedule a yearly follow up for further refills. Thank you. 90 capsule 0   ezetimibe  (ZETIA ) 10 MG tablet Take 1 tablet (  10 mg total) by mouth daily. Please call [phone] to schedule an overdue appointment for future refills. Thank you. 1st attempt. 30 tablet 0   FERROUS SULFATE PO Take 65 mg by mouth daily.     gabapentin  (NEURONTIN ) 100 MG capsule Take 1 or 2 three times a day 180 capsule 11   ibuprofen (ADVIL,MOTRIN) 200 MG tablet Take 400 mg by mouth as needed (pain).      lisinopril  (ZESTRIL ) 10 MG tablet Take 1 tablet (10 mg total) by mouth every morning. 90 tablet 3   losartan (COZAAR) 25 MG tablet Take 25 mg by mouth daily.     metFORMIN (GLUCOPHAGE) 500 MG tablet Take 500 mg by mouth 2 (two) times daily with a meal.      Multiple Vitamins-Minerals (PRESERVISION AREDS 2 PO) Take 1 capsule by mouth daily.     NON FORMULARY CPAP     Omega-3 Fatty Acids (FISH OIL) 1200 MG CAPS Take 3 capsules by mouth daily.     OZEMPIC, 0.25 OR 0.5 MG/DOSE, 2  MG/3ML SOPN Inject 0.5 mg into the skin once a week.     traZODone  (DESYREL ) 50 MG tablet Take 150 mg by mouth at bedtime.     vitamin B-12 (CYANOCOBALAMIN ) 1000 MCG tablet 1 tablet     No current facility-administered medications for this visit.     Assessment & Plan    Pure hypercholesterolemia Assessment: Patient with CAD not at LDL goal of < 55 Most recent LDL 93 on 02/2024 Has been compliant with high intensity statin/ezetimibe  : atorvastatin  80, ezetimibe  10 Reviewed options for lowering LDL cholesterol, including ezetimibe , PCSK-9 inhibitors, bempedoic acid and inclisiran.  Discussed mechanisms of action, dosing, side effects, potential decreases in LDL cholesterol and costs.  Also reviewed potential options for patient assistance.  Plan: Patient agreeable to starting Repatha  140 mg Repeat labs after:  3 months Lipid Liver function Patient was given information on [employer] grant - will sign patient up when PA approved Marital status Income < $72,000 (single) or < $102,000 (married)   [name], PharmD CPP [facility] [address] [place], [place] [address] [phone]  03/27/2024, 6:49 AM

## 2024-03-27 ENCOUNTER — Telehealth: Payer: Self-pay | Admitting: Pharmacy Technician

## 2024-03-27 ENCOUNTER — Encounter (HOSPITAL_BASED_OUTPATIENT_CLINIC_OR_DEPARTMENT_OTHER): Payer: Self-pay | Admitting: Pharmacist Clinician (PhC)/ Clinical Pharmacy Specialist

## 2024-03-27 ENCOUNTER — Encounter: Payer: Self-pay | Admitting: Pharmacist Clinician (PhC)/ Clinical Pharmacy Specialist

## 2024-03-27 ENCOUNTER — Telehealth (HOSPITAL_BASED_OUTPATIENT_CLINIC_OR_DEPARTMENT_OTHER): Payer: Self-pay | Admitting: Pharmacist Clinician (PhC)/ Clinical Pharmacy Specialist

## 2024-03-27 ENCOUNTER — Other Ambulatory Visit (HOSPITAL_COMMUNITY): Payer: Self-pay

## 2024-03-27 DIAGNOSIS — E7800 Pure hypercholesterolemia, unspecified: Secondary | ICD-10-CM

## 2024-03-27 MED ORDER — REPATHA SURECLICK 140 MG/ML ~~LOC~~ SOAJ: 140.0000 mg | SUBCUTANEOUS | 3 refills | Status: AC

## 2024-03-27 NOTE — Telephone Encounter (Signed)
 Patient Advocate Encounter   The patient was approved for a Healthwell grant that will help cover the cost of repatha  Total amount awarded, 2500.  Effective: 02/26/24 - 02/24/25   APW:[id number] ERW:[id number] Hmnle:[id number] PI:[id number] Healthwell ID: [id number]   Pharmacy provided with approval and processing information. Patient informed via phone and mychart

## 2024-03-27 NOTE — Assessment & Plan Note (Signed)
 Assessment: Patient with CAD not at LDL goal of < 55 Most recent LDL 93 on 02/2024 Has been compliant with high intensity statin/ezetimibe  : atorvastatin  80, ezetimibe  10 Reviewed options for lowering LDL cholesterol, including ezetimibe , PCSK-9 inhibitors, bempedoic acid and inclisiran.  Discussed mechanisms of action, dosing, side effects, potential decreases in LDL cholesterol and costs.  Also reviewed potential options for patient assistance.  Plan: Patient agreeable to starting Repatha  140 mg Repeat labs after:  3 months Lipid Liver function Patient was given information on [employer] - will sign patient up when PA approved Marital status Income < $72,000 (single) or < $102,000 (married)

## 2024-03-27 NOTE — Telephone Encounter (Signed)
 Please do PA for Repatha

## 2024-03-27 NOTE — Telephone Encounter (Signed)
 FROM PT CALLS   Pharmacy Patient Advocate Encounter   Received notification from Pt Calls Messages that prior authorization for repatha  is required/requested.   Insurance verification completed.   The patient is insured through [place] .   Per test claim: PA required; PA submitted to above mentioned insurance via Latent Key/confirmation #/EOC [id number] Status is pending   Pharmacy Patient Advocate Encounter  Received notification from [employer] that Prior Authorization for repatha  has been APPROVED from 03/27/24 to 07/30/24. Ran test claim, Copay is $40.00. This test claim was processed through [facility]- copay amounts may vary at other pharmacies due to pharmacy/plan contracts, or as the patient moves through the different stages of their insurance plan.   PA #/Case ID/Reference #: [id number]  I got the patient a heatlhwell grant. I called and left her a message to see where she wants the healthwell grant at. I put the grant information in [facility] and [facility].  Patient Advocate Encounter   The patient was approved for a Healthwell grant that will help cover the cost of repatha  Total amount awarded, 2500.  Effective: 02/26/24 - 02/24/25   APW:[id number] ERW:[id number] Hmnle:[id number] PI:[id number] Healthwell ID: [id number]   Pharmacy provided with approval and processing information. Patient informed via phone and mychart

## 2024-04-08 DIAGNOSIS — F422 Mixed obsessional thoughts and acts: Secondary | ICD-10-CM | POA: Diagnosis not present

## 2024-04-08 DIAGNOSIS — F331 Major depressive disorder, recurrent, moderate: Secondary | ICD-10-CM | POA: Diagnosis not present

## 2024-04-08 DIAGNOSIS — F410 Panic disorder [episodic paroxysmal anxiety] without agoraphobia: Secondary | ICD-10-CM | POA: Diagnosis not present

## 2024-04-18 ENCOUNTER — Ambulatory Visit: Admitting: Pharmacist Clinician (PhC)/ Clinical Pharmacy Specialist

## 2024-05-08 DIAGNOSIS — Z23 Encounter for immunization: Secondary | ICD-10-CM | POA: Diagnosis not present

## 2024-05-08 DIAGNOSIS — N644 Mastodynia: Secondary | ICD-10-CM | POA: Diagnosis not present

## 2024-05-08 DIAGNOSIS — E7800 Pure hypercholesterolemia, unspecified: Secondary | ICD-10-CM | POA: Diagnosis not present

## 2024-05-08 DIAGNOSIS — Z0000 Encounter for general adult medical examination without abnormal findings: Secondary | ICD-10-CM | POA: Diagnosis not present

## 2024-05-08 DIAGNOSIS — G629 Polyneuropathy, unspecified: Secondary | ICD-10-CM | POA: Diagnosis not present

## 2024-05-08 DIAGNOSIS — Z1331 Encounter for screening for depression: Secondary | ICD-10-CM | POA: Diagnosis not present

## 2024-05-08 DIAGNOSIS — Z6833 Body mass index (BMI) 33.0-33.9, adult: Secondary | ICD-10-CM | POA: Diagnosis not present

## 2024-05-08 DIAGNOSIS — E1149 Type 2 diabetes mellitus with other diabetic neurological complication: Secondary | ICD-10-CM | POA: Diagnosis not present

## 2024-05-09 ENCOUNTER — Other Ambulatory Visit: Payer: Self-pay | Admitting: Family Medicine

## 2024-05-09 DIAGNOSIS — N644 Mastodynia: Secondary | ICD-10-CM

## 2024-05-10 ENCOUNTER — Other Ambulatory Visit (HOSPITAL_BASED_OUTPATIENT_CLINIC_OR_DEPARTMENT_OTHER): Payer: Self-pay | Admitting: Family Medicine

## 2024-05-10 DIAGNOSIS — Z780 Asymptomatic menopausal state: Secondary | ICD-10-CM

## 2024-05-15 ENCOUNTER — Other Ambulatory Visit: Payer: Self-pay | Admitting: Cardiology

## 2024-05-15 DIAGNOSIS — H524 Presbyopia: Secondary | ICD-10-CM | POA: Diagnosis not present

## 2024-05-15 DIAGNOSIS — E119 Type 2 diabetes mellitus without complications: Secondary | ICD-10-CM | POA: Diagnosis not present

## 2024-05-16 ENCOUNTER — Telehealth: Payer: Self-pay | Admitting: Cardiology

## 2024-05-16 MED ORDER — EZETIMIBE 10 MG PO TABS: 10.0000 mg | ORAL_TABLET | Freq: Every day | ORAL | 2 refills | Status: DC

## 2024-05-16 NOTE — Telephone Encounter (Signed)
*  STAT* If patient is at the pharmacy, call can be transferred to refill team.   1. Which medications need to be refilled? (please list name of each medication and dose if known)  ezetimibe  (ZETIA ) 10 MG tablet   2. Which pharmacy/location (including street and city if local pharmacy) is medication to be sent to? [id number] [place], [place] - [address]    3. Do they need a 30 day or 90 day supply?  30 day supply  Patient says she has been completely out of medication for 1 week.

## 2024-05-16 NOTE — Telephone Encounter (Signed)
 Refill sent.

## 2024-06-02 DIAGNOSIS — Z23 Encounter for immunization: Secondary | ICD-10-CM | POA: Diagnosis not present

## 2024-06-13 ENCOUNTER — Telehealth: Payer: Self-pay | Admitting: Cardiology

## 2024-06-13 DIAGNOSIS — E7800 Pure hypercholesterolemia, unspecified: Secondary | ICD-10-CM

## 2024-06-13 NOTE — Telephone Encounter (Signed)
 Message  sent to  [facility]  team to manage

## 2024-06-13 NOTE — Telephone Encounter (Signed)
 Pt c/o medication issue:  1. Name of Medication:   Evolocumab  (REPATHA  SURECLICK) 140 MG/ML SOAJ    2. How are you currently taking this medication (dosage and times per day)?    3. Are you having a reaction (difficulty breathing--STAT)? no  4. What is your medication issue? Patient had a bad pen and needs a replace. In order for her to get replace our office needs to call to say that we prescribe her the medication. [phone]. Please advise

## 2024-06-16 MED ORDER — REPATHA SURECLICK 140 MG/ML ~~LOC~~ SOAJ: 1.0000 mL | SUBCUTANEOUS | 0 refills | Status: AC

## 2024-06-16 NOTE — Telephone Encounter (Signed)
 Replacement Rx sent to [facility]

## 2024-06-20 ENCOUNTER — Other Ambulatory Visit

## 2024-06-20 ENCOUNTER — Encounter

## 2024-06-26 ENCOUNTER — Other Ambulatory Visit: Payer: Self-pay | Admitting: Cardiology

## 2024-07-09 ENCOUNTER — Ambulatory Visit

## 2024-07-09 ENCOUNTER — Other Ambulatory Visit: Payer: Self-pay | Admitting: Family Medicine

## 2024-07-09 ENCOUNTER — Ambulatory Visit
Admission: RE | Admit: 2024-07-09 | Discharge: 2024-07-09 | Disposition: A | Discharge status: Home / Self Care | Source: Ambulatory Visit | Attending: Family Medicine | Admitting: Family Medicine

## 2024-07-09 DIAGNOSIS — Z1231 Encounter for screening mammogram for malignant neoplasm of breast: Secondary | ICD-10-CM

## 2024-07-09 DIAGNOSIS — N644 Mastodynia: Secondary | ICD-10-CM

## 2024-08-06 ENCOUNTER — Other Ambulatory Visit: Payer: Self-pay | Admitting: Family Medicine

## 2024-08-06 ENCOUNTER — Ambulatory Visit
Admission: RE | Admit: 2024-08-06 | Discharge: 2024-08-06 | Disposition: A | Discharge status: Home / Self Care | Source: Ambulatory Visit | Attending: Family Medicine | Admitting: Family Medicine

## 2024-08-06 DIAGNOSIS — N644 Mastodynia: Secondary | ICD-10-CM

## 2024-08-06 DIAGNOSIS — Z1231 Encounter for screening mammogram for malignant neoplasm of breast: Secondary | ICD-10-CM

## 2024-08-10 ENCOUNTER — Other Ambulatory Visit: Payer: Self-pay | Admitting: Cardiology

## 2024-08-12 ENCOUNTER — Other Ambulatory Visit: Payer: Self-pay

## 2024-08-12 ENCOUNTER — Ambulatory Visit: Payer: Self-pay | Admitting: Cardiology

## 2024-08-12 DIAGNOSIS — I2510 Atherosclerotic heart disease of native coronary artery without angina pectoris: Secondary | ICD-10-CM

## 2024-08-12 DIAGNOSIS — Z79899 Other long term (current) drug therapy: Secondary | ICD-10-CM

## 2024-08-12 DIAGNOSIS — E7800 Pure hypercholesterolemia, unspecified: Secondary | ICD-10-CM

## 2024-08-12 MED ORDER — ATORVASTATIN CALCIUM 40 MG PO TABS: 40.0000 mg | ORAL_TABLET | Freq: Every day | ORAL | 3 refills | Status: AC
